# Patient Record
Sex: Female | Born: 1940 | Race: Black or African American | Hispanic: No | Smoking: Former smoker
Health system: Southern US, Community
[De-identification: ages and names within clinical notes are randomized; demographics above are authoritative.]

## PROBLEM LIST (undated history)

## (undated) DIAGNOSIS — I480 Paroxysmal atrial fibrillation: Secondary | ICD-10-CM

## (undated) DIAGNOSIS — I2510 Atherosclerotic heart disease of native coronary artery without angina pectoris: Secondary | ICD-10-CM

## (undated) DIAGNOSIS — Z992 Dependence on renal dialysis: Secondary | ICD-10-CM

## (undated) DIAGNOSIS — N186 End stage renal disease: Secondary | ICD-10-CM

## (undated) DIAGNOSIS — E785 Hyperlipidemia, unspecified: Secondary | ICD-10-CM

## (undated) DIAGNOSIS — I10 Essential (primary) hypertension: Secondary | ICD-10-CM

## (undated) DIAGNOSIS — D649 Anemia, unspecified: Secondary | ICD-10-CM

## (undated) DIAGNOSIS — J449 Chronic obstructive pulmonary disease, unspecified: Secondary | ICD-10-CM

## (undated) DIAGNOSIS — I739 Peripheral vascular disease, unspecified: Secondary | ICD-10-CM

## (undated) DIAGNOSIS — I350 Nonrheumatic aortic (valve) stenosis: Secondary | ICD-10-CM

## (undated) DIAGNOSIS — I509 Heart failure, unspecified: Secondary | ICD-10-CM

## (undated) DIAGNOSIS — I50812 Chronic right heart failure: Secondary | ICD-10-CM

## (undated) HISTORY — DX: Essential (primary) hypertension: I10

## (undated) HISTORY — DX: Heart failure, unspecified: I50.9

## (undated) HISTORY — DX: Chronic obstructive pulmonary disease, unspecified: J44.9

## (undated) HISTORY — DX: Atherosclerotic heart disease of native coronary artery without angina pectoris: I25.10

## (undated) HISTORY — DX: Hyperlipidemia, unspecified: E78.5

## (undated) HISTORY — DX: Peripheral vascular disease, unspecified: I73.9

## (undated) HISTORY — PX: ABDOMINAL HYSTERECTOMY: SHX81

## (undated) HISTORY — DX: Anemia, unspecified: D64.9

---

## 2005-10-01 ENCOUNTER — Ambulatory Visit: Payer: Self-pay | Admitting: Oncology

## 2005-12-10 ENCOUNTER — Ambulatory Visit: Payer: Self-pay | Admitting: Oncology

## 2006-03-11 ENCOUNTER — Ambulatory Visit: Payer: Self-pay | Admitting: Oncology

## 2006-04-27 ENCOUNTER — Ambulatory Visit: Payer: Self-pay | Admitting: Oncology

## 2006-07-29 ENCOUNTER — Ambulatory Visit: Payer: Self-pay | Admitting: Oncology

## 2006-12-27 ENCOUNTER — Ambulatory Visit: Payer: Self-pay | Admitting: Oncology

## 2007-03-08 ENCOUNTER — Ambulatory Visit: Payer: Self-pay

## 2007-04-25 ENCOUNTER — Ambulatory Visit: Payer: Self-pay | Admitting: Oncology

## 2007-12-25 ENCOUNTER — Ambulatory Visit: Payer: Self-pay | Admitting: Surgery

## 2008-01-12 ENCOUNTER — Ambulatory Visit: Payer: Self-pay | Admitting: Surgery

## 2008-01-12 ENCOUNTER — Ambulatory Visit (HOSPITAL_COMMUNITY): Admission: RE | Admit: 2008-01-12 | Discharge: 2008-01-12 | Payer: Self-pay | Admitting: Surgery

## 2008-01-12 HISTORY — PX: AV FISTULA PLACEMENT: SHX1204

## 2008-02-07 ENCOUNTER — Inpatient Hospital Stay (HOSPITAL_COMMUNITY): Admission: AD | Admit: 2008-02-07 | Discharge: 2008-02-13 | Payer: Self-pay | Admitting: Nephrology

## 2008-02-08 ENCOUNTER — Encounter (INDEPENDENT_AMBULATORY_CARE_PROVIDER_SITE_OTHER): Payer: Self-pay | Admitting: Nephrology

## 2008-04-08 ENCOUNTER — Ambulatory Visit: Payer: Self-pay | Admitting: Surgery

## 2008-04-23 ENCOUNTER — Ambulatory Visit: Payer: Self-pay | Admitting: Surgery

## 2008-04-23 ENCOUNTER — Ambulatory Visit (HOSPITAL_COMMUNITY): Admission: RE | Admit: 2008-04-23 | Discharge: 2008-04-23 | Payer: Self-pay | Admitting: Surgery

## 2008-05-27 ENCOUNTER — Ambulatory Visit: Payer: Self-pay | Admitting: Surgery

## 2009-10-10 ENCOUNTER — Inpatient Hospital Stay (HOSPITAL_COMMUNITY): Admission: EM | Admit: 2009-10-10 | Discharge: 2009-10-27 | Payer: Self-pay | Admitting: Pulmonary Disease

## 2009-10-10 ENCOUNTER — Ambulatory Visit: Payer: Self-pay | Admitting: Pulmonary Disease

## 2009-10-13 ENCOUNTER — Encounter (INDEPENDENT_AMBULATORY_CARE_PROVIDER_SITE_OTHER): Payer: Self-pay | Admitting: Pulmonary Disease

## 2009-10-15 ENCOUNTER — Ambulatory Visit: Payer: Self-pay | Admitting: Cardiothoracic Surgery

## 2009-10-15 HISTORY — PX: CARDIAC CATHETERIZATION: SHX172

## 2009-10-17 ENCOUNTER — Encounter: Payer: Self-pay | Admitting: Cardiothoracic Surgery

## 2009-10-20 ENCOUNTER — Encounter: Payer: Self-pay | Admitting: Cardiothoracic Surgery

## 2009-10-20 HISTORY — PX: CORONARY ARTERY BYPASS GRAFT: SHX141

## 2009-10-22 ENCOUNTER — Ambulatory Visit: Payer: Self-pay | Admitting: Physical Medicine & Rehabilitation

## 2009-11-19 ENCOUNTER — Encounter: Admission: RE | Admit: 2009-11-19 | Discharge: 2009-11-19 | Payer: Self-pay | Admitting: Cardiothoracic Surgery

## 2009-11-19 ENCOUNTER — Ambulatory Visit: Payer: Self-pay | Admitting: Cardiothoracic Surgery

## 2009-12-10 ENCOUNTER — Encounter: Admission: RE | Admit: 2009-12-10 | Discharge: 2009-12-10 | Payer: Self-pay | Admitting: Cardiothoracic Surgery

## 2009-12-10 ENCOUNTER — Ambulatory Visit: Payer: Self-pay | Admitting: Cardiothoracic Surgery

## 2009-12-15 ENCOUNTER — Inpatient Hospital Stay (HOSPITAL_COMMUNITY): Admission: EM | Admit: 2009-12-15 | Discharge: 2009-12-18 | Payer: Self-pay | Admitting: Internal Medicine

## 2009-12-16 ENCOUNTER — Ambulatory Visit: Payer: Self-pay | Admitting: Cardiothoracic Surgery

## 2009-12-17 HISTORY — PX: THORACENTESIS: SHX235

## 2009-12-24 ENCOUNTER — Ambulatory Visit: Payer: Self-pay | Admitting: Cardiothoracic Surgery

## 2009-12-24 ENCOUNTER — Encounter: Admission: RE | Admit: 2009-12-24 | Discharge: 2009-12-24 | Payer: Self-pay | Admitting: Cardiothoracic Surgery

## 2010-09-20 ENCOUNTER — Encounter: Payer: Self-pay | Admitting: Cardiothoracic Surgery

## 2010-11-09 ENCOUNTER — Inpatient Hospital Stay (HOSPITAL_COMMUNITY)
Admission: AD | Admit: 2010-11-09 | Discharge: 2010-11-12 | DRG: 377 | Disposition: A | Discharge status: Home / Self Care | Payer: Medicare Other | Source: Other Acute Inpatient Hospital | Attending: Internal Medicine | Admitting: Internal Medicine

## 2010-11-09 ENCOUNTER — Encounter: Payer: Self-pay | Admitting: Internal Medicine

## 2010-11-09 DIAGNOSIS — N186 End stage renal disease: Secondary | ICD-10-CM | POA: Diagnosis present

## 2010-11-09 DIAGNOSIS — I120 Hypertensive chronic kidney disease with stage 5 chronic kidney disease or end stage renal disease: Secondary | ICD-10-CM | POA: Diagnosis present

## 2010-11-09 DIAGNOSIS — E119 Type 2 diabetes mellitus without complications: Secondary | ICD-10-CM | POA: Diagnosis present

## 2010-11-09 DIAGNOSIS — Z794 Long term (current) use of insulin: Secondary | ICD-10-CM

## 2010-11-09 DIAGNOSIS — K254 Chronic or unspecified gastric ulcer with hemorrhage: Principal | ICD-10-CM | POA: Diagnosis present

## 2010-11-09 DIAGNOSIS — I2510 Atherosclerotic heart disease of native coronary artery without angina pectoris: Secondary | ICD-10-CM | POA: Diagnosis present

## 2010-11-09 DIAGNOSIS — N2581 Secondary hyperparathyroidism of renal origin: Secondary | ICD-10-CM | POA: Diagnosis present

## 2010-11-09 DIAGNOSIS — Z951 Presence of aortocoronary bypass graft: Secondary | ICD-10-CM

## 2010-11-09 DIAGNOSIS — D649 Anemia, unspecified: Secondary | ICD-10-CM | POA: Diagnosis present

## 2010-11-09 LAB — MRSA PCR SCREENING: MRSA by PCR: NEGATIVE

## 2010-11-09 NOTE — H&P (Signed)
Hospital Admission Note Date: 11/09/2010  Patient name: [name] Medical record number: [id number] Date of birth: [birth year] Age: 70 y.o. Gender: female PCP: No primary provider on file.  Medical Service: Internal Medicine Teaching Service  Attending physician: Dr. Rogelia Boga  Pager: Resident [phone]): Dr Baltazar Apo  Pager: [phone] Resident (R1): Dr Anselm Jungling   [phone]  Chief Complaint: black stool  History of Present Illness: 70 yo woman with PMH of s/p CABG for 3 vessels disease, CHF, ESRD on HD M/W/F, DM, COPD was transferred from [facility] for altered mental status during dialysis- she did receive full dialysis today.  Patient reports not feeling well since this Saturday 11/07/10.  She reports nausea and vomiting and diarrhea/black stool that darker than black in addition to vomiting up black "vomitus" as well; however, the story provided by the patient is unclear.  She has been taking Goody powder for her stomach pain. Hb was found to be 6.6 at [facility].  Denies any dizziness, weakness, SOB, or chest pain, urinary symptoms.    and states that she knew that her blood sugar was running low. She said usually her CBGs at home 110-120s.  HOME MEDS: Zemplar qweek Epogen 2600 qweek Venofer 100mg  qweek Loperamide 2mg  prn Metoprolol 50mg  qhs Isosorbide 60mg  qhs Renavite daily Lisinopril 20mg  poqd ASA 81mg  po qd Simvastatin 40mg  po qhs Insulin 70/30 SSI    Allergies: Sulfa and codeine: rash, swelling and hives  PMH:  1. Status post CABG x3 February 2011, for severe three-vessel,       unstable angina, and CHF.   2. 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   3. Diabetes.   4. COPD  FH: Father: died of unknown cause Mother: alive and healthy  History   Social History  . Marital Status: Widowed    Spouse Name: N/A    Number of Children: N/A  . Years of Education: N/A   Occupational History  . Not on file.   Social History Main Topics  . Smoking  status: Not on file  . Smokeless tobacco: Not on file  . Alcohol Use: Not on file  . Drug Use: Not on file  . Sexually Active: Not on file   Other Topics Concern  . Not on file   Social History Narrative  . No narrative on file    Review of Systems: Pertinent items are noted in HPI.  Physical Exam: T 98.2, BP 106/86, P 70, R 18., O2 sat 99% RA  General appearance: alert, cooperative and appears stated age Head: Normocephalic, without obvious abnormality, atraumatic Eyes: conjunctivae/corneas clear. PERRL, EOM's intact. Fundi benign. Throat: lips, mucosa, and tongue normal; teeth and gums normal Lungs: clear to auscultation bilaterally, no wheezes, no crackles Heart: regular rate and rhythm and S1, S2 normal, +4/6 systolic murmur Abdomen: soft, non-tender; bowel sounds normal; no masses,  no organomegaly Extremities: no edema, redness or tenderness in the calves or thighs, Right LE wound with black  Eschar 2x10cm with surrounding edema (she fell 3 weeks ago and did not go to the doctor or ED), no drainage or erythema Pulses: 2+ and symmetric Neurologic: Alert and oriented X 3, normal strength and tone. Normal symmetric reflexes. Normal coordination and gait   Lab results: Na 140  K 3.1, Cl 95, CO2 32, Glu 140, BUN 25, Cr 2.38, GFR 25, AG 16, Ca 9.6, Alb 3.3, T protein 5.7, Tro 0.05, Myoglobin 296, NT-proBNP 31600, AST 23, ALT 28, Alk phos 69, osm 275,, WBC 8.7, Hb  6.6, Hct 19.2, MCV 98, Plt 222, differential WNL RBC morph: +1 aniso, 1+ ellipto, +1 polychrom PT 11.1, PTT 25.4, INR 1.1  Assessment & Plan by Problem:  1. GI bleed: Hb 6.6 with a baseline Hb 9-10.  Likely upper GI bleed given her black stool.  But lower GI could be possible as well.   - Admit to SDU -type & cross & Transfuse 3 u pRBC, check CBC 1 hr after post transfusion - consulted GI for upper endoscopy to find source of bleeding -Will consult Renal in AM since her dialysis day is M/W/F -Patient received 80mg   IV bolus of protonix at [facility] and then started on gtt at 8mg /hr.  We spoke to GI and the recommendation is to keep patient on protonix gtt for now - will check cbc q 12 hr once the post transfusion is stable  2. ESRD- on HD M/W/F: normally receive dialysis in [place] -Will consult renal in AM - Will continue Zemplar, Epogen, Venofer, and Renavite  3. S/p CABG: stable, no complaints of chest pain.  Will continue Simvastatin, will hold ASA & isosorbide  in setting of GI bleed  4. DM type II- stable, will check HbA1c, SSI-sensitive, CBGs qAC & HS  VTE prophylaxis: SCDs     ATTENDING: I performed and/or observed a history and physical examination of the patient.  I discussed the case with the residents as noted and reviewed the residents' notes.  I agree with the findings and plan--please refer to the attending physician note for more details.   Signature________________________________  Printed Name_____________________________

## 2010-11-10 ENCOUNTER — Other Ambulatory Visit: Payer: Self-pay | Admitting: Internal Medicine

## 2010-11-10 DIAGNOSIS — K259 Gastric ulcer, unspecified as acute or chronic, without hemorrhage or perforation: Secondary | ICD-10-CM

## 2010-11-10 DIAGNOSIS — D649 Anemia, unspecified: Secondary | ICD-10-CM

## 2010-11-10 DIAGNOSIS — K922 Gastrointestinal hemorrhage, unspecified: Secondary | ICD-10-CM

## 2010-11-10 DIAGNOSIS — K921 Melena: Secondary | ICD-10-CM

## 2010-11-10 LAB — CBC
HCT: 30.7 % — ABNORMAL LOW (ref 36.0–46.0)
HCT: 36.4 % (ref 36.0–46.0)
MCH: 31.1 pg (ref 26.0–34.0)
MCHC: 33.8 g/dL (ref 30.0–36.0)
MCV: 90 fL (ref 78.0–100.0)
MCV: 90.1 fL (ref 78.0–100.0)
RDW: 16.7 % — ABNORMAL HIGH (ref 11.5–15.5)
RDW: 17.2 % — ABNORMAL HIGH (ref 11.5–15.5)
WBC: 8.5 10*3/uL (ref 4.0–10.5)

## 2010-11-10 LAB — GLUCOSE, CAPILLARY: Glucose-Capillary: 124 mg/dL — ABNORMAL HIGH (ref 70–99)

## 2010-11-10 LAB — RENAL FUNCTION PANEL
BUN: 31 mg/dL — ABNORMAL HIGH (ref 6–23)
Glucose, Bld: 108 mg/dL — ABNORMAL HIGH (ref 70–99)
Phosphorus: 4.7 mg/dL — ABNORMAL HIGH (ref 2.3–4.6)
Potassium: 3.5 mEq/L (ref 3.5–5.1)

## 2010-11-10 LAB — CARDIAC PANEL(CRET KIN+CKTOT+MB+TROPI)
CK, MB: 2.4 ng/mL (ref 0.3–4.0)
CK, MB: 2.8 ng/mL (ref 0.3–4.0)
Relative Index: INVALID (ref 0.0–2.5)
Total CK: 55 U/L (ref 7–177)

## 2010-11-11 ENCOUNTER — Inpatient Hospital Stay (HOSPITAL_COMMUNITY): Payer: Medicare Other

## 2010-11-11 LAB — CROSSMATCH
ABO/RH(D): O POS
Unit division: 0

## 2010-11-11 LAB — CBC
HCT: 32.5 % — ABNORMAL LOW (ref 36.0–46.0)
MCHC: 34.5 g/dL (ref 30.0–36.0)
MCV: 90.5 fL (ref 78.0–100.0)
RDW: 17.1 % — ABNORMAL HIGH (ref 11.5–15.5)
WBC: 10.1 10*3/uL (ref 4.0–10.5)

## 2010-11-11 LAB — GLUCOSE, CAPILLARY
Glucose-Capillary: 147 mg/dL — ABNORMAL HIGH (ref 70–99)
Glucose-Capillary: 147 mg/dL — ABNORMAL HIGH (ref 70–99)

## 2010-11-11 LAB — RENAL FUNCTION PANEL
CO2: 27 mEq/L (ref 19–32)
Calcium: 8.4 mg/dL (ref 8.4–10.5)
Creatinine, Ser: 5.89 mg/dL — ABNORMAL HIGH (ref 0.4–1.2)
Glucose, Bld: 128 mg/dL — ABNORMAL HIGH (ref 70–99)

## 2010-11-11 LAB — CARDIAC PANEL(CRET KIN+CKTOT+MB+TROPI)
CK, MB: 2.2 ng/mL (ref 0.3–4.0)
Relative Index: INVALID (ref 0.0–2.5)
Troponin I: 0.22 ng/mL — ABNORMAL HIGH (ref 0.00–0.06)

## 2010-11-12 DIAGNOSIS — K922 Gastrointestinal hemorrhage, unspecified: Secondary | ICD-10-CM

## 2010-11-12 LAB — RENAL FUNCTION PANEL
Albumin: 3 g/dL — ABNORMAL LOW (ref 3.5–5.2)
GFR calc Af Amer: 13 mL/min — ABNORMAL LOW (ref >60)
GFR calc non Af Amer: 11 mL/min — ABNORMAL LOW (ref >60)
Glucose, Bld: 207 mg/dL — ABNORMAL HIGH (ref 70–99)
Phosphorus: 2.5 mg/dL (ref 2.3–4.6)
Potassium: 4 mEq/L (ref 3.5–5.1)
Sodium: 134 mEq/L — ABNORMAL LOW (ref 135–145)

## 2010-11-12 LAB — CBC
HCT: 38.8 % (ref 36.0–46.0)
RDW: 17 % — ABNORMAL HIGH (ref 11.5–15.5)
WBC: 8.6 10*3/uL (ref 4.0–10.5)

## 2010-11-12 LAB — GLUCOSE, CAPILLARY
Glucose-Capillary: 253 mg/dL — ABNORMAL HIGH (ref 70–99)
Glucose-Capillary: 202 mg/dL — ABNORMAL HIGH (ref 70–99)

## 2010-11-17 LAB — BODY FLUID CULTURE

## 2010-11-17 LAB — GLUCOSE, CAPILLARY
Glucose-Capillary: 101 mg/dL — ABNORMAL HIGH (ref 70–99)
Glucose-Capillary: 131 mg/dL — ABNORMAL HIGH (ref 70–99)
Glucose-Capillary: 141 mg/dL — ABNORMAL HIGH (ref 70–99)
Glucose-Capillary: 185 mg/dL — ABNORMAL HIGH (ref 70–99)
Glucose-Capillary: 187 mg/dL — ABNORMAL HIGH (ref 70–99)
Glucose-Capillary: 243 mg/dL — ABNORMAL HIGH (ref 70–99)
Glucose-Capillary: 45 mg/dL — ABNORMAL LOW (ref 70–99)
Glucose-Capillary: 64 mg/dL — ABNORMAL LOW (ref 70–99)
Glucose-Capillary: 69 mg/dL — ABNORMAL LOW (ref 70–99)
Glucose-Capillary: 87 mg/dL (ref 70–99)

## 2010-11-17 LAB — CBC
HCT: 31.9 % — ABNORMAL LOW (ref 36.0–46.0)
Hemoglobin: 10.7 g/dL — ABNORMAL LOW (ref 12.0–15.0)
MCHC: 32.1 g/dL (ref 30.0–36.0)
MCV: 88.9 fL (ref 78.0–100.0)
Platelets: 321 10*3/uL (ref 150–400)
RBC: 3.75 MIL/uL — ABNORMAL LOW (ref 3.87–5.11)
RDW: 22 % — ABNORMAL HIGH (ref 11.5–15.5)
WBC: 7.8 10*3/uL (ref 4.0–10.5)

## 2010-11-17 LAB — RENAL FUNCTION PANEL
Albumin: 2.8 g/dL — ABNORMAL LOW (ref 3.5–5.2)
Albumin: 3 g/dL — ABNORMAL LOW (ref 3.5–5.2)
BUN: 45 mg/dL — ABNORMAL HIGH (ref 6–23)
Calcium: 8.3 mg/dL — ABNORMAL LOW (ref 8.4–10.5)
Creatinine, Ser: 6.5 mg/dL — ABNORMAL HIGH (ref 0.4–1.2)
GFR calc Af Amer: 20 mL/min — ABNORMAL LOW (ref >60)
GFR calc non Af Amer: 17 mL/min — ABNORMAL LOW (ref >60)
Phosphorus: 2.2 mg/dL — ABNORMAL LOW (ref 2.3–4.6)
Phosphorus: 3.8 mg/dL (ref 2.3–4.6)
Potassium: 4.2 mEq/L (ref 3.5–5.1)
Sodium: 135 mEq/L (ref 135–145)

## 2010-11-17 LAB — BASIC METABOLIC PANEL
CO2: 25 mEq/L (ref 19–32)
Calcium: 8.6 mg/dL (ref 8.4–10.5)
GFR calc Af Amer: 10 mL/min — ABNORMAL LOW (ref >60)
Sodium: 123 mEq/L — ABNORMAL LOW (ref 135–145)

## 2010-11-17 NOTE — Procedures (Addendum)
Summary: Upper Endoscopy  Patient: [name] Note: All result statuses are Final unless otherwise noted.  Tests: (1) Upper Endoscopy (EGD)   EGD Upper Endoscopy       DONE     [facility]     [address]     [place], [place]  [address]          ENDOSCOPY PROCEDURE REPORT          PATIENT:  [name], [name]  MR#:  [id number]     BIRTHDATE:  February 03, 1941, 69 yrs. old  GENDER:  female          ENDOSCOPIST:  Hedwig Morton. Juanda Chance, MD     Referred by:  Blanch Media, M.D.          PROCEDURE DATE:  11/10/2010     PROCEDURE:  EGD with biopsy, 43239     ASA CLASS:  Class III     INDICATIONS:  melena, anemia ? hx of UGIB in remote past          MEDICATIONS:   Versed 3 mg, Fentanyl 20 mcg     TOPICAL ANESTHETIC:  Cetacaine Spray          DESCRIPTION OF PROCEDURE:   After the risks benefits and     alternatives of the procedure were thoroughly explained, informed     consent was obtained.  The Pentax Gastroscope [id number] endoscope     was introduced through the mouth and advanced to the second     portion of the duodenum, without limitations.  The instrument was     slowly withdrawn as the mucosa was fully examined.     <<PROCEDUREIMAGES>>          An ulcer was found in the antrum. With standard forceps, a biopsy     was obtained and sent to pathology (see image001, image004, and     image005). 1 cm oval shape ulcer with clean egdes, and benign     features, no stigmata of bleeding, no blood in the stomach     Otherwise the examination was normal (see image006, image007,     image003, and image002).    Retroflexed views revealed no     abnormalities.    The scope was then withdrawn from the patient     and the procedure completed.          COMPLICATIONS:  None          ENDOSCOPIC IMPRESSION:     1) Ulcer in the antrum     2) Otherwise normal examination     benign appearing medium size prepyloric ulcer without stigmata     of recent bleeding,,no evidence  of outlet obstruction     RECOMMENDATIONS:     1) Await biopsy results     see chart for plan of treatment          REPEAT EXAM:  In 8 week(s) for.          ______________________________     Hedwig Morton. Juanda Chance, MD          CC:          n.     eSIGNED:   Hedwig Morton. Leanora Murin at 11/10/2010 03:40 PM          Vania Rea, [id number]  Note: An exclamation mark (!) indicates a result that was not dispersed into the flowsheet. Document Creation Date: 11/10/2010 3:42 PM _______________________________________________________________________  (1) Order result status: Final  Collection or observation date-time: 11/10/2010 15:32 Requested date-time:  Receipt date-time:  Reported date-time:  Referring Physician:   Ordering Physician: Lina Sar [phone]) Specimen Source:  Source: Launa Grill Order Number: [id number] Lab site:

## 2010-11-18 LAB — BLOOD GAS, ARTERIAL
Acid-base deficit: 1.3 mmol/L (ref 0.0–2.0)
Bicarbonate: 23.1 mEq/L (ref 20.0–24.0)
FIO2: 0.21 %
O2 Saturation: 87.2 %
Patient temperature: 98.6
TCO2: 24.3 mmol/L (ref 0–100)
pCO2 arterial: 39.7 mmHg (ref 35.0–45.0)
pH, Arterial: 7.382 (ref 7.350–7.400)
pO2, Arterial: 53.9 mmHg — ABNORMAL LOW (ref 80.0–100.0)

## 2010-11-18 LAB — GLUCOSE, CAPILLARY
Glucose-Capillary: 103 mg/dL — ABNORMAL HIGH (ref 70–99)
Glucose-Capillary: 105 mg/dL — ABNORMAL HIGH (ref 70–99)
Glucose-Capillary: 108 mg/dL — ABNORMAL HIGH (ref 70–99)
Glucose-Capillary: 111 mg/dL — ABNORMAL HIGH (ref 70–99)
Glucose-Capillary: 113 mg/dL — ABNORMAL HIGH (ref 70–99)
Glucose-Capillary: 122 mg/dL — ABNORMAL HIGH (ref 70–99)
Glucose-Capillary: 132 mg/dL — ABNORMAL HIGH (ref 70–99)
Glucose-Capillary: 135 mg/dL — ABNORMAL HIGH (ref 70–99)
Glucose-Capillary: 136 mg/dL — ABNORMAL HIGH (ref 70–99)
Glucose-Capillary: 142 mg/dL — ABNORMAL HIGH (ref 70–99)
Glucose-Capillary: 144 mg/dL — ABNORMAL HIGH (ref 70–99)
Glucose-Capillary: 146 mg/dL — ABNORMAL HIGH (ref 70–99)
Glucose-Capillary: 148 mg/dL — ABNORMAL HIGH (ref 70–99)
Glucose-Capillary: 152 mg/dL — ABNORMAL HIGH (ref 70–99)
Glucose-Capillary: 165 mg/dL — ABNORMAL HIGH (ref 70–99)
Glucose-Capillary: 165 mg/dL — ABNORMAL HIGH (ref 70–99)
Glucose-Capillary: 168 mg/dL — ABNORMAL HIGH (ref 70–99)
Glucose-Capillary: 170 mg/dL — ABNORMAL HIGH (ref 70–99)
Glucose-Capillary: 171 mg/dL — ABNORMAL HIGH (ref 70–99)
Glucose-Capillary: 171 mg/dL — ABNORMAL HIGH (ref 70–99)
Glucose-Capillary: 174 mg/dL — ABNORMAL HIGH (ref 70–99)
Glucose-Capillary: 174 mg/dL — ABNORMAL HIGH (ref 70–99)
Glucose-Capillary: 177 mg/dL — ABNORMAL HIGH (ref 70–99)
Glucose-Capillary: 179 mg/dL — ABNORMAL HIGH (ref 70–99)
Glucose-Capillary: 63 mg/dL — ABNORMAL LOW (ref 70–99)
Glucose-Capillary: 70 mg/dL (ref 70–99)
Glucose-Capillary: 71 mg/dL (ref 70–99)
Glucose-Capillary: 72 mg/dL (ref 70–99)
Glucose-Capillary: 75 mg/dL (ref 70–99)
Glucose-Capillary: 83 mg/dL (ref 70–99)
Glucose-Capillary: 84 mg/dL (ref 70–99)
Glucose-Capillary: 95 mg/dL (ref 70–99)
Glucose-Capillary: 96 mg/dL (ref 70–99)

## 2010-11-18 LAB — CBC
HCT: 25.2 % — ABNORMAL LOW (ref 36.0–46.0)
HCT: 25.5 % — ABNORMAL LOW (ref 36.0–46.0)
HCT: 27.1 % — ABNORMAL LOW (ref 36.0–46.0)
HCT: 27.4 % — ABNORMAL LOW (ref 36.0–46.0)
HCT: 27.7 % — ABNORMAL LOW (ref 36.0–46.0)
HCT: 28 % — ABNORMAL LOW (ref 36.0–46.0)
HCT: 28.4 % — ABNORMAL LOW (ref 36.0–46.0)
HCT: 29.2 % — ABNORMAL LOW (ref 36.0–46.0)
HCT: 29.6 % — ABNORMAL LOW (ref 36.0–46.0)
HCT: 29.6 % — ABNORMAL LOW (ref 36.0–46.0)
HCT: 30.1 % — ABNORMAL LOW (ref 36.0–46.0)
HCT: 30.4 % — ABNORMAL LOW (ref 36.0–46.0)
HCT: 31.4 % — ABNORMAL LOW (ref 36.0–46.0)
HCT: 31.6 % — ABNORMAL LOW (ref 36.0–46.0)
HCT: 34.3 % — ABNORMAL LOW (ref 36.0–46.0)
HCT: 34.3 % — ABNORMAL LOW (ref 36.0–46.0)
HCT: 38.7 % (ref 36.0–46.0)
HCT: 43.6 % (ref 36.0–46.0)
Hemoglobin: 10 g/dL — ABNORMAL LOW (ref 12.0–15.0)
Hemoglobin: 10 g/dL — ABNORMAL LOW (ref 12.0–15.0)
Hemoglobin: 10.1 g/dL — ABNORMAL LOW (ref 12.0–15.0)
Hemoglobin: 10.7 g/dL — ABNORMAL LOW (ref 12.0–15.0)
Hemoglobin: 10.8 g/dL — ABNORMAL LOW (ref 12.0–15.0)
Hemoglobin: 11.8 g/dL — ABNORMAL LOW (ref 12.0–15.0)
Hemoglobin: 11.8 g/dL — ABNORMAL LOW (ref 12.0–15.0)
Hemoglobin: 13.3 g/dL (ref 12.0–15.0)
Hemoglobin: 14.8 g/dL (ref 12.0–15.0)
Hemoglobin: 8.5 g/dL — ABNORMAL LOW (ref 12.0–15.0)
Hemoglobin: 8.9 g/dL — ABNORMAL LOW (ref 12.0–15.0)
Hemoglobin: 9.3 g/dL — ABNORMAL LOW (ref 12.0–15.0)
Hemoglobin: 9.4 g/dL — ABNORMAL LOW (ref 12.0–15.0)
Hemoglobin: 9.6 g/dL — ABNORMAL LOW (ref 12.0–15.0)
Hemoglobin: 9.9 g/dL — ABNORMAL LOW (ref 12.0–15.0)
MCHC: 33.5 g/dL (ref 30.0–36.0)
MCHC: 33.7 g/dL (ref 30.0–36.0)
MCHC: 33.7 g/dL (ref 30.0–36.0)
MCHC: 33.7 g/dL (ref 30.0–36.0)
MCHC: 33.8 g/dL (ref 30.0–36.0)
MCHC: 34 g/dL (ref 30.0–36.0)
MCHC: 34 g/dL (ref 30.0–36.0)
MCHC: 34.1 g/dL (ref 30.0–36.0)
MCHC: 34.2 g/dL (ref 30.0–36.0)
MCHC: 34.2 g/dL (ref 30.0–36.0)
MCHC: 34.2 g/dL (ref 30.0–36.0)
MCHC: 34.3 g/dL (ref 30.0–36.0)
MCHC: 34.3 g/dL (ref 30.0–36.0)
MCHC: 34.4 g/dL (ref 30.0–36.0)
MCV: 93.3 fL (ref 78.0–100.0)
MCV: 93.5 fL (ref 78.0–100.0)
MCV: 93.5 fL (ref 78.0–100.0)
MCV: 94.2 fL (ref 78.0–100.0)
MCV: 94.2 fL (ref 78.0–100.0)
MCV: 94.6 fL (ref 78.0–100.0)
MCV: 94.7 fL (ref 78.0–100.0)
MCV: 94.7 fL (ref 78.0–100.0)
MCV: 95 fL (ref 78.0–100.0)
MCV: 95 fL (ref 78.0–100.0)
MCV: 95.1 fL (ref 78.0–100.0)
MCV: 95.2 fL (ref 78.0–100.0)
MCV: 95.5 fL (ref 78.0–100.0)
MCV: 95.7 fL (ref 78.0–100.0)
MCV: 95.9 fL (ref 78.0–100.0)
MCV: 96.1 fL (ref 78.0–100.0)
MCV: 96.9 fL (ref 78.0–100.0)
Platelets: 156 10*3/uL (ref 150–400)
Platelets: 159 10*3/uL (ref 150–400)
Platelets: 160 10*3/uL (ref 150–400)
Platelets: 177 10*3/uL (ref 150–400)
Platelets: 180 10*3/uL (ref 150–400)
Platelets: 183 10*3/uL (ref 150–400)
Platelets: 185 10*3/uL (ref 150–400)
Platelets: 191 10*3/uL (ref 150–400)
Platelets: 193 10*3/uL (ref 150–400)
Platelets: 199 10*3/uL (ref 150–400)
Platelets: 216 10*3/uL (ref 150–400)
Platelets: 217 10*3/uL (ref 150–400)
Platelets: 263 10*3/uL (ref 150–400)
Platelets: 359 10*3/uL (ref 150–400)
Platelets: 371 10*3/uL (ref 150–400)
Platelets: 401 10*3/uL — ABNORMAL HIGH (ref 150–400)
RBC: 2.67 MIL/uL — ABNORMAL LOW (ref 3.87–5.11)
RBC: 2.82 MIL/uL — ABNORMAL LOW (ref 3.87–5.11)
RBC: 2.82 MIL/uL — ABNORMAL LOW (ref 3.87–5.11)
RBC: 2.89 MIL/uL — ABNORMAL LOW (ref 3.87–5.11)
RBC: 2.98 MIL/uL — ABNORMAL LOW (ref 3.87–5.11)
RBC: 3.07 MIL/uL — ABNORMAL LOW (ref 3.87–5.11)
RBC: 3.09 MIL/uL — ABNORMAL LOW (ref 3.87–5.11)
RBC: 3.11 MIL/uL — ABNORMAL LOW (ref 3.87–5.11)
RBC: 3.21 MIL/uL — ABNORMAL LOW (ref 3.87–5.11)
RBC: 3.34 MIL/uL — ABNORMAL LOW (ref 3.87–5.11)
RBC: 3.65 MIL/uL — ABNORMAL LOW (ref 3.87–5.11)
RBC: 3.67 MIL/uL — ABNORMAL LOW (ref 3.87–5.11)
RBC: 4.15 MIL/uL (ref 3.87–5.11)
RBC: 4.66 MIL/uL (ref 3.87–5.11)
RDW: 14.5 % (ref 11.5–15.5)
RDW: 14.7 % (ref 11.5–15.5)
RDW: 14.8 % (ref 11.5–15.5)
RDW: 14.8 % (ref 11.5–15.5)
RDW: 14.9 % (ref 11.5–15.5)
RDW: 15 % (ref 11.5–15.5)
RDW: 15.1 % (ref 11.5–15.5)
RDW: 15.1 % (ref 11.5–15.5)
RDW: 15.3 % (ref 11.5–15.5)
RDW: 15.3 % (ref 11.5–15.5)
RDW: 15.5 % (ref 11.5–15.5)
RDW: 15.7 % — ABNORMAL HIGH (ref 11.5–15.5)
RDW: 15.9 % — ABNORMAL HIGH (ref 11.5–15.5)
RDW: 16 % — ABNORMAL HIGH (ref 11.5–15.5)
RDW: 16.2 % — ABNORMAL HIGH (ref 11.5–15.5)
WBC: 12.3 10*3/uL — ABNORMAL HIGH (ref 4.0–10.5)
WBC: 12.4 10*3/uL — ABNORMAL HIGH (ref 4.0–10.5)
WBC: 12.6 10*3/uL — ABNORMAL HIGH (ref 4.0–10.5)
WBC: 13.9 10*3/uL — ABNORMAL HIGH (ref 4.0–10.5)
WBC: 15.2 10*3/uL — ABNORMAL HIGH (ref 4.0–10.5)
WBC: 15.7 10*3/uL — ABNORMAL HIGH (ref 4.0–10.5)
WBC: 16.1 10*3/uL — ABNORMAL HIGH (ref 4.0–10.5)
WBC: 17.3 10*3/uL — ABNORMAL HIGH (ref 4.0–10.5)
WBC: 17.6 10*3/uL — ABNORMAL HIGH (ref 4.0–10.5)
WBC: 24.2 10*3/uL — ABNORMAL HIGH (ref 4.0–10.5)
WBC: 29.6 10*3/uL — ABNORMAL HIGH (ref 4.0–10.5)
WBC: 7.7 10*3/uL (ref 4.0–10.5)
WBC: 8.7 10*3/uL (ref 4.0–10.5)
WBC: 9.2 10*3/uL (ref 4.0–10.5)

## 2010-11-18 LAB — COMPREHENSIVE METABOLIC PANEL
ALT: 26 U/L (ref 0–35)
ALT: 43 U/L — ABNORMAL HIGH (ref 0–35)
ALT: 45 U/L — ABNORMAL HIGH (ref 0–35)
AST: 28 U/L (ref 0–37)
AST: 33 U/L (ref 0–37)
AST: 94 U/L — ABNORMAL HIGH (ref 0–37)
Albumin: 2.7 g/dL — ABNORMAL LOW (ref 3.5–5.2)
Albumin: 3.2 g/dL — ABNORMAL LOW (ref 3.5–5.2)
Albumin: 3.4 g/dL — ABNORMAL LOW (ref 3.5–5.2)
Alkaline Phosphatase: 57 U/L (ref 39–117)
Alkaline Phosphatase: 58 U/L (ref 39–117)
Alkaline Phosphatase: 59 U/L (ref 39–117)
BUN: 12 mg/dL (ref 6–23)
BUN: 12 mg/dL (ref 6–23)
BUN: 38 mg/dL — ABNORMAL HIGH (ref 6–23)
CO2: 23 mEq/L (ref 19–32)
CO2: 25 mEq/L (ref 19–32)
CO2: 25 mEq/L (ref 19–32)
CO2: 27 mEq/L (ref 19–32)
Calcium: 7.9 mg/dL — ABNORMAL LOW (ref 8.4–10.5)
Calcium: 8.5 mg/dL (ref 8.4–10.5)
Chloride: 100 mEq/L (ref 96–112)
Chloride: 100 mEq/L (ref 96–112)
Chloride: 96 mEq/L (ref 96–112)
Chloride: 97 mEq/L (ref 96–112)
Creatinine, Ser: 3.41 mg/dL — ABNORMAL HIGH (ref 0.4–1.2)
Creatinine, Ser: 3.74 mg/dL — ABNORMAL HIGH (ref 0.4–1.2)
Creatinine, Ser: 4.1 mg/dL — ABNORMAL HIGH (ref 0.4–1.2)
Creatinine, Ser: 6.72 mg/dL — ABNORMAL HIGH (ref 0.4–1.2)
GFR calc Af Amer: 13 mL/min — ABNORMAL LOW (ref >60)
GFR calc Af Amer: 15 mL/min — ABNORMAL LOW (ref >60)
GFR calc Af Amer: 16 mL/min — ABNORMAL LOW (ref >60)
GFR calc non Af Amer: 11 mL/min — ABNORMAL LOW (ref >60)
GFR calc non Af Amer: 12 mL/min — ABNORMAL LOW (ref >60)
GFR calc non Af Amer: 13 mL/min — ABNORMAL LOW (ref >60)
GFR calc non Af Amer: 6 mL/min — ABNORMAL LOW (ref >60)
Glucose, Bld: 116 mg/dL — ABNORMAL HIGH (ref 70–99)
Glucose, Bld: 165 mg/dL — ABNORMAL HIGH (ref 70–99)
Glucose, Bld: 203 mg/dL — ABNORMAL HIGH (ref 70–99)
Glucose, Bld: 257 mg/dL — ABNORMAL HIGH (ref 70–99)
Potassium: 3.7 mEq/L (ref 3.5–5.1)
Potassium: 4 mEq/L (ref 3.5–5.1)
Potassium: 4.1 mEq/L (ref 3.5–5.1)
Sodium: 136 mEq/L (ref 135–145)
Sodium: 138 mEq/L (ref 135–145)
Total Bilirubin: 0.7 mg/dL (ref 0.3–1.2)
Total Bilirubin: 0.7 mg/dL (ref 0.3–1.2)
Total Bilirubin: 0.9 mg/dL (ref 0.3–1.2)
Total Bilirubin: 1 mg/dL (ref 0.3–1.2)
Total Protein: 5.5 g/dL — ABNORMAL LOW (ref 6.0–8.3)
Total Protein: 6.8 g/dL (ref 6.0–8.3)

## 2010-11-18 LAB — RENAL FUNCTION PANEL
Albumin: 2.2 g/dL — ABNORMAL LOW (ref 3.5–5.2)
Albumin: 2.3 g/dL — ABNORMAL LOW (ref 3.5–5.2)
Albumin: 2.7 g/dL — ABNORMAL LOW (ref 3.5–5.2)
Albumin: 2.7 g/dL — ABNORMAL LOW (ref 3.5–5.2)
Albumin: 2.8 g/dL — ABNORMAL LOW (ref 3.5–5.2)
Albumin: 3.4 g/dL — ABNORMAL LOW (ref 3.5–5.2)
Albumin: 3.5 g/dL (ref 3.5–5.2)
BUN: 20 mg/dL (ref 6–23)
BUN: 25 mg/dL — ABNORMAL HIGH (ref 6–23)
BUN: 27 mg/dL — ABNORMAL HIGH (ref 6–23)
BUN: 27 mg/dL — ABNORMAL HIGH (ref 6–23)
BUN: 44 mg/dL — ABNORMAL HIGH (ref 6–23)
CO2: 21 mEq/L (ref 19–32)
CO2: 21 mEq/L (ref 19–32)
CO2: 23 mEq/L (ref 19–32)
CO2: 23 mEq/L (ref 19–32)
Calcium: 7.6 mg/dL — ABNORMAL LOW (ref 8.4–10.5)
Calcium: 7.8 mg/dL — ABNORMAL LOW (ref 8.4–10.5)
Calcium: 8.2 mg/dL — ABNORMAL LOW (ref 8.4–10.5)
Calcium: 8.3 mg/dL — ABNORMAL LOW (ref 8.4–10.5)
Chloride: 107 mEq/L (ref 96–112)
Chloride: 93 mEq/L — ABNORMAL LOW (ref 96–112)
Chloride: 94 mEq/L — ABNORMAL LOW (ref 96–112)
Chloride: 94 mEq/L — ABNORMAL LOW (ref 96–112)
Chloride: 98 mEq/L (ref 96–112)
Creatinine, Ser: 5.36 mg/dL — ABNORMAL HIGH (ref 0.4–1.2)
Creatinine, Ser: 5.37 mg/dL — ABNORMAL HIGH (ref 0.4–1.2)
Creatinine, Ser: 5.88 mg/dL — ABNORMAL HIGH (ref 0.4–1.2)
Creatinine, Ser: 7.85 mg/dL — ABNORMAL HIGH (ref 0.4–1.2)
GFR calc Af Amer: 10 mL/min — ABNORMAL LOW (ref >60)
GFR calc Af Amer: 18 mL/min — ABNORMAL LOW (ref >60)
GFR calc Af Amer: 6 mL/min — ABNORMAL LOW (ref >60)
GFR calc Af Amer: 7 mL/min — ABNORMAL LOW (ref >60)
GFR calc Af Amer: 8 mL/min — ABNORMAL LOW (ref >60)
GFR calc Af Amer: 9 mL/min — ABNORMAL LOW (ref >60)
GFR calc non Af Amer: 15 mL/min — ABNORMAL LOW (ref >60)
GFR calc non Af Amer: 5 mL/min — ABNORMAL LOW (ref >60)
GFR calc non Af Amer: 6 mL/min — ABNORMAL LOW (ref >60)
GFR calc non Af Amer: 7 mL/min — ABNORMAL LOW (ref >60)
GFR calc non Af Amer: 8 mL/min — ABNORMAL LOW (ref >60)
Glucose, Bld: 125 mg/dL — ABNORMAL HIGH (ref 70–99)
Glucose, Bld: 132 mg/dL — ABNORMAL HIGH (ref 70–99)
Glucose, Bld: 143 mg/dL — ABNORMAL HIGH (ref 70–99)
Glucose, Bld: 168 mg/dL — ABNORMAL HIGH (ref 70–99)
Glucose, Bld: 195 mg/dL — ABNORMAL HIGH (ref 70–99)
Glucose, Bld: 263 mg/dL — ABNORMAL HIGH (ref 70–99)
Glucose, Bld: 78 mg/dL (ref 70–99)
Phosphorus: 1.7 mg/dL — ABNORMAL LOW (ref 2.3–4.6)
Phosphorus: 3 mg/dL (ref 2.3–4.6)
Phosphorus: 3.7 mg/dL (ref 2.3–4.6)
Phosphorus: 3.9 mg/dL (ref 2.3–4.6)
Phosphorus: 4.3 mg/dL (ref 2.3–4.6)
Phosphorus: 5.6 mg/dL — ABNORMAL HIGH (ref 2.3–4.6)
Potassium: 3.1 mEq/L — ABNORMAL LOW (ref 3.5–5.1)
Potassium: 3.7 mEq/L (ref 3.5–5.1)
Potassium: 3.9 mEq/L (ref 3.5–5.1)
Potassium: 4 mEq/L (ref 3.5–5.1)
Sodium: 131 mEq/L — ABNORMAL LOW (ref 135–145)
Sodium: 132 mEq/L — ABNORMAL LOW (ref 135–145)
Sodium: 133 mEq/L — ABNORMAL LOW (ref 135–145)
Sodium: 135 mEq/L (ref 135–145)
Sodium: 138 mEq/L (ref 135–145)
Sodium: 139 mEq/L (ref 135–145)

## 2010-11-18 LAB — POCT I-STAT 3, ART BLOOD GAS (G3+)
Acid-Base Excess: 1 mmol/L (ref 0.0–2.0)
Acid-base deficit: 3 mmol/L — ABNORMAL HIGH (ref 0.0–2.0)
Acid-base deficit: 3 mmol/L — ABNORMAL HIGH (ref 0.0–2.0)
Acid-base deficit: 5 mmol/L — ABNORMAL HIGH (ref 0.0–2.0)
Acid-base deficit: 5 mmol/L — ABNORMAL HIGH (ref 0.0–2.0)
Bicarbonate: 19.9 mEq/L — ABNORMAL LOW (ref 20.0–24.0)
Bicarbonate: 20.9 mEq/L (ref 20.0–24.0)
Bicarbonate: 22 mEq/L (ref 20.0–24.0)
Bicarbonate: 22.3 mEq/L (ref 20.0–24.0)
O2 Saturation: 96 %
O2 Saturation: 99 %
O2 Saturation: 99 %
O2 Saturation: 99 %
Patient temperature: 36.9
Patient temperature: 37.1
TCO2: 20 mmol/L (ref 0–100)
TCO2: 21 mmol/L (ref 0–100)
TCO2: 22 mmol/L (ref 0–100)
TCO2: 23 mmol/L (ref 0–100)
pCO2 arterial: 38 mmHg (ref 35.0–45.0)
pCO2 arterial: 38.3 mmHg (ref 35.0–45.0)
pCO2 arterial: 40.1 mmHg (ref 35.0–45.0)
pCO2 arterial: 40.1 mmHg (ref 35.0–45.0)
pCO2 arterial: 40.2 mmHg (ref 35.0–45.0)
pH, Arterial: 7.295 — ABNORMAL LOW (ref 7.350–7.400)
pH, Arterial: 7.31 — ABNORMAL LOW (ref 7.350–7.400)
pH, Arterial: 7.381 (ref 7.350–7.400)
pH, Arterial: 7.441 — ABNORMAL HIGH (ref 7.350–7.400)
pH, Arterial: 7.449 — ABNORMAL HIGH (ref 7.350–7.400)
pO2, Arterial: 134 mmHg — ABNORMAL HIGH (ref 80.0–100.0)
pO2, Arterial: 139 mmHg — ABNORMAL HIGH (ref 80.0–100.0)
pO2, Arterial: 275 mmHg — ABNORMAL HIGH (ref 80.0–100.0)
pO2, Arterial: 347 mmHg — ABNORMAL HIGH (ref 80.0–100.0)
pO2, Arterial: 366 mmHg — ABNORMAL HIGH (ref 80.0–100.0)

## 2010-11-18 LAB — PREPARE PLATELETS

## 2010-11-18 LAB — POCT I-STAT, CHEM 8
BUN: 19 mg/dL (ref 6–23)
BUN: 8 mg/dL (ref 6–23)
Calcium, Ion: 0.7 mmol/L — CL (ref 1.12–1.32)
Chloride: 105 mEq/L (ref 96–112)
Chloride: 107 mEq/L (ref 96–112)
Chloride: 95 mEq/L — ABNORMAL LOW (ref 96–112)
Creatinine, Ser: 5 mg/dL — ABNORMAL HIGH (ref 0.4–1.2)
Glucose, Bld: 119 mg/dL — ABNORMAL HIGH (ref 70–99)
Glucose, Bld: 122 mg/dL — ABNORMAL HIGH (ref 70–99)
Glucose, Bld: 82 mg/dL (ref 70–99)
HCT: 19 % — ABNORMAL LOW (ref 36.0–46.0)
HCT: 22 % — ABNORMAL LOW (ref 36.0–46.0)
HCT: 39 % (ref 36.0–46.0)
Hemoglobin: 11.9 g/dL — ABNORMAL LOW (ref 12.0–15.0)
Hemoglobin: 13.3 g/dL (ref 12.0–15.0)
Potassium: 3.4 mEq/L — ABNORMAL LOW (ref 3.5–5.1)
Potassium: 3.9 mEq/L (ref 3.5–5.1)
Potassium: 4.6 mEq/L (ref 3.5–5.1)
Sodium: 135 mEq/L (ref 135–145)
Sodium: 139 mEq/L (ref 135–145)
TCO2: 19 mmol/L (ref 0–100)
TCO2: 25 mmol/L (ref 0–100)

## 2010-11-18 LAB — BASIC METABOLIC PANEL
BUN: 15 mg/dL (ref 6–23)
BUN: 19 mg/dL (ref 6–23)
BUN: 22 mg/dL (ref 6–23)
BUN: 26 mg/dL — ABNORMAL HIGH (ref 6–23)
BUN: 28 mg/dL — ABNORMAL HIGH (ref 6–23)
CO2: 19 mEq/L (ref 19–32)
CO2: 27 mEq/L (ref 19–32)
CO2: 27 mEq/L (ref 19–32)
CO2: 32 mEq/L (ref 19–32)
Calcium: 7 mg/dL — ABNORMAL LOW (ref 8.4–10.5)
Calcium: 8.3 mg/dL — ABNORMAL LOW (ref 8.4–10.5)
Calcium: 8.6 mg/dL (ref 8.4–10.5)
Calcium: 9 mg/dL (ref 8.4–10.5)
Chloride: 108 mEq/L (ref 96–112)
Chloride: 94 mEq/L — ABNORMAL LOW (ref 96–112)
Chloride: 97 mEq/L (ref 96–112)
Chloride: 97 mEq/L (ref 96–112)
Chloride: 99 mEq/L (ref 96–112)
Creatinine, Ser: 4 mg/dL — ABNORMAL HIGH (ref 0.4–1.2)
Creatinine, Ser: 4.65 mg/dL — ABNORMAL HIGH (ref 0.4–1.2)
Creatinine, Ser: 4.99 mg/dL — ABNORMAL HIGH (ref 0.4–1.2)
Creatinine, Ser: 6.58 mg/dL — ABNORMAL HIGH (ref 0.4–1.2)
Creatinine, Ser: 7.8 mg/dL — ABNORMAL HIGH (ref 0.4–1.2)
GFR calc Af Amer: 11 mL/min — ABNORMAL LOW (ref >60)
GFR calc Af Amer: 11 mL/min — ABNORMAL LOW (ref >60)
GFR calc Af Amer: 14 mL/min — ABNORMAL LOW (ref >60)
GFR calc Af Amer: 8 mL/min — ABNORMAL LOW (ref >60)
GFR calc non Af Amer: 11 mL/min — ABNORMAL LOW (ref >60)
GFR calc non Af Amer: 5 mL/min — ABNORMAL LOW (ref >60)
GFR calc non Af Amer: 6 mL/min — ABNORMAL LOW (ref >60)
GFR calc non Af Amer: 9 mL/min — ABNORMAL LOW (ref >60)
GFR calc non Af Amer: 9 mL/min — ABNORMAL LOW (ref >60)
Glucose, Bld: 162 mg/dL — ABNORMAL HIGH (ref 70–99)
Glucose, Bld: 178 mg/dL — ABNORMAL HIGH (ref 70–99)
Glucose, Bld: 189 mg/dL — ABNORMAL HIGH (ref 70–99)
Glucose, Bld: 235 mg/dL — ABNORMAL HIGH (ref 70–99)
Glucose, Bld: 90 mg/dL (ref 70–99)
Potassium: 3.3 mEq/L — ABNORMAL LOW (ref 3.5–5.1)
Potassium: 4 mEq/L (ref 3.5–5.1)
Potassium: 4.2 mEq/L (ref 3.5–5.1)
Potassium: 4.5 mEq/L (ref 3.5–5.1)
Potassium: 4.7 mEq/L (ref 3.5–5.1)
Sodium: 133 mEq/L — ABNORMAL LOW (ref 135–145)
Sodium: 135 mEq/L (ref 135–145)
Sodium: 136 mEq/L (ref 135–145)
Sodium: 138 mEq/L (ref 135–145)

## 2010-11-18 LAB — CROSSMATCH
ABO/RH(D): O POS
Antibody Screen: NEGATIVE

## 2010-11-18 LAB — CARDIAC PANEL(CRET KIN+CKTOT+MB+TROPI)
Relative Index: 1.6 (ref 0.0–2.5)
Total CK: 1504 U/L — ABNORMAL HIGH (ref 7–177)
Troponin I: 5.55 ng/mL (ref 0.00–0.06)
Troponin I: 6.43 ng/mL (ref 0.00–0.06)

## 2010-11-18 LAB — IRON AND TIBC
Iron: 10 ug/dL — ABNORMAL LOW (ref 42–135)
Saturation Ratios: 11 % — ABNORMAL LOW (ref 20–55)
TIBC: 92 ug/dL — ABNORMAL LOW (ref 250–470)
UIBC: 82 ug/dL

## 2010-11-18 LAB — POCT I-STAT 4, (NA,K, GLUC, HGB,HCT)
Glucose, Bld: 186 mg/dL — ABNORMAL HIGH (ref 70–99)
HCT: 23 % — ABNORMAL LOW (ref 36.0–46.0)
HCT: 25 % — ABNORMAL LOW (ref 36.0–46.0)
HCT: 43 % (ref 36.0–46.0)
Hemoglobin: 8.5 g/dL — ABNORMAL LOW (ref 12.0–15.0)
Sodium: 139 mEq/L (ref 135–145)

## 2010-11-18 LAB — PROTIME-INR
INR: 1.05 (ref 0.00–1.49)
INR: 1.49 (ref 0.00–1.49)
Prothrombin Time: 17.9 seconds — ABNORMAL HIGH (ref 11.6–15.2)

## 2010-11-18 LAB — FECAL LACTOFERRIN, QUANT

## 2010-11-18 LAB — POCT I-STAT 3, VENOUS BLOOD GAS (G3P V)
Acid-Base Excess: 1 mmol/L (ref 0.0–2.0)
Bicarbonate: 22.7 mEq/L (ref 20.0–24.0)
O2 Saturation: 65 %
O2 Saturation: 99 %
TCO2: 23 mmol/L (ref 0–100)
pCO2, Ven: 46.2 mmHg (ref 45.0–50.0)
pH, Ven: 7.388 — ABNORMAL HIGH (ref 7.250–7.300)
pO2, Ven: 131 mmHg — ABNORMAL HIGH (ref 30.0–45.0)
pO2, Ven: 34 mmHg (ref 30.0–45.0)

## 2010-11-18 LAB — PLATELET COUNT: Platelets: 203 10*3/uL (ref 150–400)

## 2010-11-18 LAB — CLOSTRIDIUM DIFFICILE EIA

## 2010-11-18 LAB — APTT
aPTT: 30 seconds (ref 24–37)
aPTT: 32 seconds (ref 24–37)
aPTT: 33 seconds (ref 24–37)

## 2010-11-18 LAB — CREATININE, SERUM
Creatinine, Ser: 2.93 mg/dL — ABNORMAL HIGH (ref 0.4–1.2)
Creatinine, Ser: 5.13 mg/dL — ABNORMAL HIGH (ref 0.4–1.2)
GFR calc Af Amer: 10 mL/min — ABNORMAL LOW (ref >60)
GFR calc Af Amer: 19 mL/min — ABNORMAL LOW (ref >60)
GFR calc non Af Amer: 16 mL/min — ABNORMAL LOW (ref >60)
GFR calc non Af Amer: 8 mL/min — ABNORMAL LOW (ref >60)

## 2010-11-18 LAB — STOOL CULTURE

## 2010-11-18 LAB — DIFFERENTIAL
Band Neutrophils: 8 % (ref 0–10)
Blasts: 0 %
Lymphocytes Relative: 9 % — ABNORMAL LOW (ref 12–46)
Metamyelocytes Relative: 0 %
Promyelocytes Absolute: 0 %

## 2010-11-18 LAB — MAGNESIUM
Magnesium: 1.8 mg/dL (ref 1.5–2.5)
Magnesium: 1.9 mg/dL (ref 1.5–2.5)
Magnesium: 2 mg/dL (ref 1.5–2.5)

## 2010-11-18 LAB — FERRITIN: Ferritin: 1629 ng/mL — ABNORMAL HIGH (ref 10–291)

## 2010-11-18 LAB — HEPARIN LEVEL (UNFRACTIONATED)
Heparin Unfractionated: <0.1 IU/mL — ABNORMAL LOW (ref 0.30–0.70)
Heparin Unfractionated: <0.1 IU/mL — ABNORMAL LOW (ref 0.30–0.70)

## 2010-11-18 LAB — MRSA PCR SCREENING: MRSA by PCR: NEGATIVE

## 2010-11-18 LAB — PHOSPHORUS
Phosphorus: 4.4 mg/dL (ref 2.3–4.6)
Phosphorus: 7.5 mg/dL — ABNORMAL HIGH (ref 2.3–4.6)

## 2010-11-18 LAB — POCT I-STAT GLUCOSE
Glucose, Bld: 158 mg/dL — ABNORMAL HIGH (ref 70–99)
Operator id: 195151

## 2010-11-18 LAB — HEMOGLOBIN A1C: Hgb A1c MFr Bld: 6.4 % — ABNORMAL HIGH (ref 4.6–6.1)

## 2010-11-18 LAB — HEMOGLOBIN AND HEMATOCRIT, BLOOD
HCT: 20.1 % — ABNORMAL LOW (ref 36.0–46.0)
Hemoglobin: 7 g/dL — ABNORMAL LOW (ref 12.0–15.0)

## 2010-11-25 NOTE — Discharge Summary (Signed)
[name], [name]                 ACCOUNT NO.:  [id number]  MEDICAL RECORD NO.:  [id number]           PATIENT TYPE:  I  LOCATION:  6703                         FACILITY:  [facility]  PHYSICIAN:  Blanch Media, M.D.DATE OF BIRTH:  [birth year]  DATE OF ADMISSION:  11/09/2010 DATE OF DISCHARGE:  11/12/2010                              DISCHARGE SUMMARY   DISCHARGE DIAGNOSES: 1. Gastrointestinal bleed secondary to ulcer in the antrum. 2. End-stage renal disease, on hemodialysis, Monday, Wednesday, and     Friday. 3. Status post coronary artery bypass graft. 4. Diabetes type 2. 5. Hypertension.  DISCHARGE MEDICATIONS: 1. PhosLo 2001 mg by mouth three times daily with meals. 2. Aranesp 200 mcg IV, Wednesday hemodialysis. 3. Protonix 40 mg take 1 tablet by mouth twice daily x1 week, then 1     tablet daily. 4. Zemplar 5 mcg IV Wednesday hemodialysis. 5. Isosorbide mononitrate XR 30 mg take one half tablet by mouth at     bedtime. 6. Loperamide 2 mg p.o. b.i.d. p.r.n. diarrhea. 7. Novolin 70/30 20 to 25 units subcu twice daily, uses as a sliding     scale based on blood sugar. 8. Rena-Vite 1 tablet by mouth daily. 9. Simvastatin 40 mg p.o. daily.  DISPOSITION AND FOLLOWUP: 1. [name] was discharged from [facility] on November 12, 2010 in stable improved condition.  She has no other black tarry     stool, shortness of breath, or chest pain.  She needs to continue     to take Protonix 40 mg p.o. b.i.d. x1 week, then 40 mg p.o. daily,     afterwards for long-term.  She will need to follow up with her     primary care physician/Gastroenterology, Dr. Laurell Roof on     November 18, 2010 at 2:45 p.m.  At that time, he will need to check     her CBC to make sure that her hemoglobin is stable.  At her     discharge, her hemoglobin was 12.7. 2. Follow up with her blood pressure.  We held all of her blood     pressure medication during this hospital course in the setting  of     GI bleeding and her blood pressure has been stable in the 120 to     150.  However, there were two elevated blood pressure in the 160     and 180.  Once her hemoglobin is stable and if her blood pressure     continued to trend up, we recommend that her blood pressure get     restarted slowly. 3. Follow up on her CBG and adjust insulin dosing if needed.  CONSULTATION: 1. Nephrology for hemodialysis. 2. GI, Dr. Juanda Chance.  Please see endoscopy and under transcription for     full detail note.  PROCEDURE PERFORMED:  Endoscopy on November 10, 2010, shows old ulcer in the antrum.  Otherwise normal examination.  Benign appearing medium- sized prepyloric ulcer with stigmata of recent bleeding, no evidence of outlet obstruction.  Biopsy result shows ulcerated gastric mucosa.  No Helicobacter pylori,  dysplasia, or malignancy.  ADMISSION HISTORY:  [name] is 70 year old woman with past medical history of status post CABG for three-vessel disease, CHF, end-stage renal disease, on hemodialysis, Monday, Wednesday, Friday, diabetes, COPD, was transferred from [facility] for altered mental status during dialysis, and which she did receive full dialysis on day of admission.  She reported not feeling well since Saturday November 07, 2010. She reports nausea and vomiting and black diarrhea stool that are (darker than black).  She also reported vomiting of black vomitus as well.  However, this still provided by the patient is unclear.  She has also been taking Goody powder for her stomach pain as well.  Hemoglobin was found to be 6.6 at [facility], therefore, she was transferred to [facility] for further evaluation and treatment.  She denies any dizziness, weakness, shortness of breath, fever, chest pain, or urinary symptoms.  She states that she knows that her blood sugar was running low and said that her CBG is usually in 110-120.  PHYSICAL EXAMINATION ON ADMISSION:  VITAL SIGNS:   Temperature 98.2, blood pressure 106/86, pulse 70, respiration 18, O2 sat 99% on room air. GENERAL:  Alert, cooperative, and appears stated age. HEAD:  Normocephalic without obvious abnormality, atraumatic. EYES:  Conjunctiva/cornea clear.  Pupils equal, round, reactive to light, and accommodation.  Extraocular movement intact.  Fundi benign. THROAT:  Lips, mucosa, and tongue normal.  Teeth and gums are normal. LUNGS:  Clear to auscultation bilaterally.  No wheezes or crackles. HEART:  Regular rate and rhythm.  S1-S2 normal plus 4/6 systolic murmur. ABDOMEN:  Soft, nontender.  Bowel sounds normal.  No masses. No organomegaly. EXTREMITIES:  No edema.  Right lower extremity wound with black eschar 2 x 10 cm with surrounding edema (she fell 2 weeks prior to admission and did not go to the ED or a doctor).  Open wound without drainage or erythema.  Pulses 2+ and symmetric. NEUROLOGIC:  Alert and oriented x3, normal strength and tone, normal symmetric reflexes, normal coordination and gait.  ADMISSION LABS:  Sodium 140, potassium 3.1, chloride 95, bicarb 32, glucose 140, BUN 25, creatinine 2.38, GFR 25, anion gap 16, calcium 9.6, albumin 3.3, total protein 5.7, troponin 0.05, myoglobin 296.  NT-proBNP 31,600, AST 23, ALT 28, alk phos 69, serum osmolality 275.  WBC 8.7, hemoglobin 6.6, hematocrit 19.2, MCV 98, platelet 222, differential within normal limits.  Red blood cell morphology +1 aniso, +1 ellipto, +1 polychromasia.  PT 11.1, PTT 25.4, INR 1.1.  ABG from [place] shows pH 7.53, pCO2 42, pO2 65, bicarb 35.1.  HOSPITAL COURSE: 1. GI bleed.  Hemoglobin on admission was 6.6 with a baseline between     9-10.  Likely upper GI bleed given her black stool.  However, a     lower GI bleed could also be possible.  The patient was admitted to     Step-Down Unit and she received 3 units of packed red blood cells     with post transfusion on day of discharge was 12.7.  Dr. Juanda Chance     with GI  examined and evaluated the patient and the patient was     taken to OR for endoscopy on November 10, 2010, which shows ulcer in     the antrum with a normal biopsy report, which only shows ulcerated     gastric mucosa, but no dysplasia, malignancy, or H. pylori.  The     patient did receive 80 mg IV bolus of Protonix  at [place] and then     was started on GTT at 8 mg/hr.  Postendoscopy, the patient was     started on Protonix 40 mg p.o. b.i.d. and she should continue for 1     week, then changed to p.o. daily for long-term.  The patient will     have close follow up with her GI doctor, Dr. Jennye Boroughs on November 18, 2010 at 2:45 p.m.Marland Kitchen  At that time, he will need to recheck her     CBC to make sure that the patient's hemoglobin is stable and that     she is not actively bleeding.  The patient was also instructed that     to stop taking Goody powders.  If she needs medications for pain,     she should see her primary care doctor for pain medication. 2. End-stage renal disease, on hemodialysis, Monday, Wednesday, and     Friday.  The patient received hemodialysis during this hospital     admission.  Dr. Hyman Hopes evaluate the patient and we continue Zemplar,     Epogen, Venofer, and Rena-Vite. 3. Status post CABG.  Stable, no complaints of chest pain.  However     given her risk factor of CABG, we did cycle her cardiac enzymes     with troponin of 0.23, 0.37, 0.22.  Her EKG did not show any ST     elevation or T-wave changes.  She has some nonspecific T-wave     changes.  The patient did not complain of any chest pain during     this admission.  Her CK-MB were within normal limits.  We continue     her simvastatin and hold aspirin and isosorbide in the setting of     GI bleed. 4. Hypertension.  Her blood pressure was adequately controlled, except     for two episodes of elevated blood pressure in the 160-180 systolic     blood pressure.  However, the patient's medications including      metoprolol, lisinopril, and Norvasc were held in the setting of her     GI bleed.  In addition, the patient was instructed to stop taking     those medications as an outpatient until she follow up with Dr.     Jennye Boroughs.  If her blood pressure continues to be elevated, he     may need to restart her blood pressure one by one slowly. 5. Diabetes type 2, stable.  Check hemoglobin A1c.  The patient     received SSI sensitive and CBG q.a.c. and q.h.s.  Her CBG range     from 101-294 during hospital course. 6. VTE prophylaxis.  The patient received SCDs.  DISCHARGE VITALS:  Temperature 97.3, pulse 76, respirations 18, blood pressure 96/64, O2 sat 96% on room air.  DISCHARGE LABS:  Sodium 134, potassium 4.0, chloride 96, bicarb 28, glucose 207, BUN 21, creatinine 4.18, calcium 9.2, albumin 3.0, phosphorus 2.5.  WBC 8.6, hemoglobin 12.7, hematocrit 38.8, platelet count 198.    ______________________________ Carrolyn Meiers, MD   ______________________________ Blanch Media, M.D.    MH/MEDQ  D:  11/12/2010  T:  11/13/2010  Job:  [id number]  cc:   Laurell Roof  Electronically Signed by Carrolyn Meiers MD on 11/16/2010 01:38:56 PM Electronically Signed by Blanch Media M.D. on 11/25/2010 03:01:33 PM

## 2011-01-12 NOTE — Op Note (Signed)
[name], [name]                 ACCOUNT NO.:  [id number]   MEDICAL RECORD NO.:  [id number]          PATIENT TYPE:  AMB   LOCATION:  SDS                          FACILITY:  [facility]   PHYSICIAN:  VDurene Cal IV, MDDATE OF BIRTH:  [birth year]   DATE OF PROCEDURE:  04/23/2008  DATE OF DISCHARGE:                               OPERATIVE REPORT   PREOPERATIVE DIAGNOSIS:  Nonmaturing left upper arm fistula.   POSTOPERATIVE DIAGNOSIS:  Nonmaturing left upper arm fistula.   PROCEDURE PERFORMED:  1. Ultrasound access, left upper arm fistula.  2. Left-sided fistulogram.  3. Percutaneous transluminal venoplasty, left cephalic vein.   INDICATIONS:  This 70 year old has undergone left upper arm fistula  placement that has not matured appropriately.  On ultrasound, she was  found to have stenosis just distal to the arterial venous anastomosis.  She comes in today for a fistulogram, possibility of an intervention.  Risk and benefits were discussed and informed consent was signed.   PROCEDURE:  The patient was identified in the holding area and taken to  room-H.  She was placed supine on the table.  Left arm was prepped and  draped in standard sterile fashion.  A time-out was called.  Lidocaine  1% was used for local anesthesia.  The cephalic vein fistula was  visualized with ultrasound and found to be patent and easily  compressible.  That was accessed with a micropuncture needle under  ultrasound guidance.  Next, an 0.067mandril wire was advanced into the  fistula under fluoroscopic visualization and a 5-French micropuncture  sheath was placed.  Through the micropuncture sheath, left arm  fistulogram was performed.   FINDINGS:  There is a high-grade greater than 90% stenosis approximately  1 cm proximal to the arterial venous anastomosis.  There are small  irregularities throughout the cephalic vein, however, no hemodynamically  significant lesions.  There is no significant central  stenosis.   INTERVENTION:  At this point of time, decision was made to intervene at  the stenosis neither arteriovenous anastomosis.  Over a Bentson wire,  the micropuncture sheath was exchanged out for a 5-French sheath.  The  patient was given 3000 units of heparin.  A 0.014 stabilizer wire was  used to cross the arterial venous anastomosis.  Balloon venoplasty was  then performed first with a 1.5-mm balloon.  This was held up for 1  minute and taken to 10 atmospheres.  Follow up arteriogram reveals  suboptimal result and a 2.5-mm balloon was used.  Again a suboptimal  result was obtained and a 4-mm x 2-mm balloon was used followed by a 5 x  2 balloon.  Following venoplasty with a 5-mm balloon taken to 8  atmospheres and final injection was performed, which revealed resolution  of the stenosis of arterial venous anastomosis.  There was an excellent  thrill within the fistula.  At this point of time, the decision was  made to terminate the procedure.  Sheaths and wires were removed.  The  patient was taken to holding area in stable condition.  She did have a  small  hematoma at her access site.   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           ______________________________  V. Charlena Cross, MD  Electronically Signed     VWB/MEDQ  D:  04/23/2008  T:  04/24/2008  Job:  [id number]   cc:   Garnetta Buddy, M.D.

## 2011-01-12 NOTE — Procedures (Signed)
CEPHALIC VEIN MAPPING   INDICATION:  Cephalic vein mapping prior to placement of dialysis  access.   HISTORY:  End-stage renal disease.  Patient is not currently on dialysis.   EXAM:   The right cephalic vein is compressible.   Diameter measurements range from 0.29 to 0.22 cm from the axilla distal  to the antecubital fossa.  Cephalic vein diameter is less than 0.15 cm  in the forearm.   The left cephalic vein is compressible.   Diameter measurements range from 0.29 to 0.24 cm from the axilla to the  antecubital fossa.  Distal to the antecubital fossa, the left cephalic  vein diameter measurements are less than 0.15 cm.   See attached worksheet for all measurements.   IMPRESSION:  Patent bilateral cephalic veins which are of acceptable  diameter for use as a dialysis access site proximal to the antecubital  fossa bilaterally.   ___________________________________________  V. Charlena Cross, MD   MC/MEDQ  D:  12/25/2007  T:  12/25/2007  Job:  [id number]

## 2011-01-12 NOTE — Discharge Summary (Signed)
NAMEHAILA, DENA                 ACCOUNT NO.:  [id number]   MEDICAL RECORD NO.:  [id number]          PATIENT TYPE:  INP   LOCATION:  6739                         FACILITY:  [facility]   PHYSICIAN:  Cecille Aver, M.D.DATE OF BIRTH:  1941/04/21   DATE OF ADMISSION:  02/07/2008  DATE OF DISCHARGE:  02/13/2008                               DISCHARGE SUMMARY   DISCHARGE DIAGNOSES:  1. Progressive chronic kidney disease with new end-stage renal      disease.  2. Congestive heart failure.  3. Hypertension.  4. Pneumonia.  5. Diabetes mellitus.  6. Anemia.  7. Secondary hyperparathyroidism.   PROCEDURES:  1. On February 08, 2008, placement of tunnel dialysis catheter via the      right internal jugular vein by Dr. Richarda Overlie.  2. On February 08, 2008, transthoracic echocardiogram.   SUMMARY:  1. Overall, left ventricular systolic function was vigorous.  Left      ventricular ejection fraction was estimated between 70% and 75%.      There were no left ventricular regional wall motion abnormalities.      Left ventricular wall thickness was mildly increased.  2. Aortic valve thickness was moderately increased.  The aortic valve      was mildly-to-moderately calcified.  There was mildly reduced      aortic valve leaflet excursion.  Findings were consistent with mild      aortic valve stenosis.  There was trivial aortic valvular      regurgitation.  3. There was mild mitral valvular regurgitation.  4. The left atrium was mildly dilated.  5. The estimated peak right ventricular systolic pressure was markedly      increased, estimated peak right ventricular systolic pressure was      70.  6. The pulmonary artery was mildly dilated.   CONSULTATIONS:  Interventional Radiology, Adam R. Henn, MD   HISTORY OF PRESENT ILLNESS:  This is a 70 year old African American  female with multiple medical problems most notable for long-standing,  poorly-controlled diabetes, hypertension, progressive  chronic kidney  disease stage IV-V, and ongoing tobacco abuse.  She presented to an  outside hospital with feelings of generalized malaise, fatigue, and some  shortness of breath with hemoptysis.  She was admitted at [facility], found to be in pulmonary edema as well as to have progressive  renal failure.  She was given IV diuretics and had not diuresed well and  was with sepsis and subsequently transferred here to [facility] for  further evaluation and management for her progressive renal failure and  refractory congestive heart failure.  The patient is without any current  complaints.   ADMISSION LABORATORY DATA:  WBC 9.8, hemoglobin 8.6, hematocrit 25.8,  and platelet 216,000.  Sodium 134, potassium 4.4, chloride 105, CO2 16,  glucose 74, BUN 91, and creatinine 5.68.  Total bilirubin 0.6, alkaline  phos 50, AST is 36, and ALT 45.  Total protein 6.1, albumin 3.3, calcium  8.6, CK-MB 3.7, iron 41, TIBC 236, percent saturation 17, and UIBC 195.  Vitamin B12 750, folate 2080 units, and ferritin  513.   DIAGNOSTIC RADIOLOGICAL EXAMINATIONS:  On February 09, 2008, two-view chest  x-ray.   IMPRESSION:  Right lung infiltrate, question pneumonia versus asymmetric  edema.   HOSPITAL COURSE:  1. Progressive chronic kidney disease with new end-stage renal      disease.  The patient was admitted.  A tunneled catheter was placed      by interventional radiology as described above.  She initially      received IV diuretic therapy; however, did not respond well on      hemodialysis until hemodialysis could be started.  After the      dialysis catheter was placed, the patient began hemodialysis      treatments which she seemed to tolerate well without difficulty.      At the time of discharge, her treatment time had been increased to      4 hours.  Her blood flow rate had been increased to 300.  Her vital      signs remained fairly stable during her hemodialysis treatment with      heart  rate ranging from the 70s to 80s and systolic blood pressure      ranging from 120s to 60s.  2. Congestive heart failure.  As stated as above, the patient was      placed on diuretic therapy until hemodialysis could be initiated.      Large amount of volume was ultrafiltrated with hemodialysis.      Average amount of volume ultrafiltrated was 3 L.  The patient also      underwent a 2-D echo for further evaluation again with results as      stated above.  Clinically, her congestive heart failure had      improved prior to discharge.  3. Hypertension.  The patient's outpatient hypertensive medications      were held during her admission.  Her blood pressure remained very      well controlled with ultrafiltration that occurred during      hemodialysis treatments.  She remained off of her blood pressure      medications.  During her stay and prior to discharge, her blood      pressure was 109/60.  4. Pneumonia.  The patient was found to have pneumonia via chest x-ray      as described above.  The patient was initially placed on IV      antibiotics, but was transitioned to oral Avelox therapy towards      the end of her hospitalization which she will complete 6 more days      as an outpatient.  5. Diabetes mellitus.  The patient continued her outpatient Avandia      therapy.  She was also placed on a carbohydrate modified diet and      was placed on sliding scale insulin with Accu-Cheks before meals      and nightly.  Her blood glucose levels were fairly well controlled      during her stay, and prior to discharge her blood glucose levels      ranged from 143-206.  6. Anemia.  The patient continued Epogen and iron therapy throughout      her hospitalization which she seemed to tolerate well and without      difficulty.  Her hemoglobin remained fairly stable in the 8's      throughout her stay.  7. Secondary hyperparathyroidism.  The patient was initiated on low-      dose vitamin  D therapy  during her hospitalization.  She required no      phosphate binder therapy as her phosphorus levels remained at goal      during her stay.   DISCHARGE MEDICATIONS:  1. Nephro-Vite 1 tablet p.o. daily.  2. Avandia 4 mg p.o. daily.  3. Atorvastatin 20 mg p.o. q. evening.  4. Imdur 60 mg p.o. nightly.  5. Avelox 400 mg one p.o. daily x6 days.   HEMODIALYSIS MEDICATIONS:  1. Zemplar 1 mcg IV q. hemodialysis treatment.  2. Venofer 100 mg IV q. hemodialysis treatment x2 then 50 mg IV q.      week.  3. Epogen 28,000 units IV q. hemodialysis treatment.  4. Tight heparin x2 weeks and then change to standard.   DISCHARGE INSTRUCTIONS:  The patient will resume a renal diabetic diet  with a 1200 mL fluid restriction daily.  No showers or swimming at this  time.  She must keep her catheter site dry and sponge bathe only.  Activity as tolerated.  She is to report to the [facility]  every Tuesday, Thursday, and Saturday at 11 o'clock for her scheduled  outpatient hemodialysis appointment.   HEMODIALYSIS INSTRUCTIONS:  The patient's treatment time is 4 hours.  She will be placed on an Opti 180 dialyzer.  Her dialysate bath is 2  potassium and 2.5 calcium.  Estimated dry weight is approximately 45.0  kg.  Blood flow rate of 350 x1 treatment then progress to 400.  Dialysate flow rate 700 x1 treatment then progress to 800.  Variable  sodium will be placed on 148 linear.  We are to use her right IJ  PermCath that this time.  Estimated use of her AV fistula will be in  August.  Please obtain an Accu-Chek p.r.n., hemoglobin A1c quarterly  with monthly labs, and an intact PTH with monthly labs this month.  Please obtain a hemoglobin level pretreatment nonstat q. treatment x3  for further hemoglobin monitoring.  Please note it took approximately 45  minutes to prepare this discharge.      Tracey P. Sherrod, NP    ______________________________  Cecille Aver, M.D.     TPS/MEDQ  D:  02/13/2008  T:  02/14/2008  Job:  [id number]   cc:   [facility] in [place]

## 2011-01-12 NOTE — Assessment & Plan Note (Signed)
OFFICE VISIT   EMRICH, Artemis A  DOB:  [birth year]                                        December 24, 2009  CHART #:  [id number]   CURRENT PROBLEMS:  1. Status post CABG x3 February 2011, for severe three-vessel,      unstable angina, and CHF.  2. 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  3. Diabetes.  4. COPD, reformed smoker.  5. Postoperative left pleural effusion, resolved.   PRESENT ILLNESS:  The patient returns with a chest x-ray to review her  recurrent postoperative left pleural effusion.  She is recently admitted  for shortness of breath and underwent a thoracentesis of approximately  400 mL of serosanguineous fluid.  At the time of discharge, her chest x-  ray showed significantly improved aeration of the left lung with  decreased pleural effusion.  She has continued to do well at home and  has not smoked.  She has no angina.  The surgical incisions are well  healed.   A chest x-ray performed today before her office visit demonstrates clear  lung fields, no residual pleural effusion, no evidence of CHF.   IMPRESSION AND PLAN:  The patient has done well following her three-  vessel bypass grafting.  The patient does have a soft systolic murmur  and has mild aortic stenosis with a  gradient of 10-15 mmHg.  This will be followed by her cardiologists at  [facility].  She will return here as needed.   Kerin Perna, M.D.  Electronically Signed   PV/MEDQ  D:  12/24/2009  T:  12/25/2009  Job:  [id number]   cc:   [facility]

## 2011-01-12 NOTE — Op Note (Signed)
NAMEZAMYA, CULHANE                 ACCOUNT NO.:  [id number]   MEDICAL RECORD NO.:  [id number]          PATIENT TYPE:  AMB   LOCATION:  SDS                          FACILITY:  [facility]   PHYSICIAN:  Juleen China IV, MDDATE OF BIRTH:  1941-02-19   DATE OF PROCEDURE:  01/12/2008  DATE OF DISCHARGE:  01/12/2008                               OPERATIVE REPORT   PREOPERATIVE DIAGNOSIS:  Chronic kidney disease.   POSTOPERATIVE DIAGNOSIS:  Chronic kidney disease.   PROCEDURE PERFORMED:  Left upper arm arteriovenous fistula.   ANESTHESIA:  MAC.   FINDINGS:  Vein accepted a #3.5 coronary dilator.   ESTIMATED BLOOD LOSS:  Minimal.   COMPLICATIONS:  None.   PROCEDURE IN DETAIL:  The patient was identified in the holding area,  taken to room 6, and she was placed supine on the table.  The left arm  was prepped and  draped in standard sterile fashion.  Time-out was  called and antibiotics were given.  I used ultrasound to identify the  course of the cephalic vein.  1% lidocaine was used for local  anesthesia.  A transverse incision was made above the antecubital  crease.  The cephalic vein was identified in the operative field and  circumferentially dissected free.  A vessel loop was placed and then the  vein was mobilized proximally and distally.  A ink pen was used to mark  the vein for appropriate orientation.  Next, the artery was mobilized.  The fascia was opened sharply and the brachial artery was identified.  It was encircled with a vessel loop and then immobilized proximally and  distally.  At this point in time, the patient was given systemic  heparinization.  The vein was transected distally and the end was  ligated with a 3-0 silk tie and a clip.  Vein was then flushed with  heparinized saline.  It was dilated up to a #3.5 coronary dilator.  At  this point in time, the artery was occluded with vascular clamps.  A #11  blade was used to make an longitudinal arteriotomy.  This was  extended  with Potts scissors.  The vein was then cut to the appropriate length  and the end was spatulated to fit the size of the arteriotomy.  The  anastomosis was then performed using a running 6-0 Prolene.  Prior to  this anastomosis, the artery was flushed in antegrade and retrograde  fashion.  Once the vascular clamps were release, I inspected the  fistula.  I was not satisfied with the lateral heel of the graft.  It  appeared that the vein had been narrowed by a suture.  For that reason,  the artery was then reoccluded.  The anastomosis was partially taken  down, for about a one-third of the anastomosis.  A 6-0 Prolene was then  used to complete the anastomosis.  At this time, the artery was then  flushed appropriately and the clamps were released.  The kinking at the  anastomosis was alleviated.  At this point in time, the heparin was  reversed with 25 mg  of protamine.  The wound was then copiously  irrigated.  The patient had a good palpable thrill  under the fistula up to the mid upper arm.  She had a palpable radial  pulse.  Again, the wound was again irrigated.  Deep tissue was closed  with 3-0 Vicryl.  Skin was closed with 4-0 Vicryl..  The patient  tolerated the procedure well.  There were no complications.           ______________________________  V. Charlena Cross, MD  Electronically Signed     VWB/MEDQ  D:  01/12/2008  T:  01/13/2008  Job:  [id number]

## 2011-01-12 NOTE — Assessment & Plan Note (Signed)
OFFICE VISIT   Baker, Cassie A  DOB:  [birth year]                                       12/25/2007  UJWJX#:[id number]   REASON FOR VISIT:  Evaluate for new access.   HISTORY:  This is a 70 year old right-handed female with chronic kidney  disease not yet on dialysis I am seeing at request of Dr. Hyman Hopes for  evaluation of her dialysis access.  The patient is diabetic, she also  has hypertension.  I have been requested to place a primary AV fistula  if possible or graft if fistula not possible.   REVIEW OF SYSTEMS:  GENERAL:  Negative for fever, chills.  EYES:  No vision complaints.  EARS NOSE, MOUTH, THROAT:  No hearing loss.  No sore throat.  CARDIOVASCULAR:  Negative for chest pain.  RESPIRATORY:  Negative for cough, wheezing.  ABDOMEN:  Negative.  Neurologic is negative.  Dermatologic is negative.   PAST MEDICAL HISTORY:  Diabetes, hypertension, chronic kidney disease,  hyperlipidemia, tobacco abuse.   FAMILY HISTORY:  Cardiovascular disease in grandmother.   SOCIAL HISTORY:  She is widowed, has history of smoking.  Does not drink  alcohol.   MEDICATIONS:  Include Toprol 25 mg per day.  Plavix 75 mg per day,  Avandia 4 mg per day, metformin 500 mg per day, Altace 25 mg per day,  Caduet 5/20 per day.  Nephro-Vite daily.  Singular p.r.n.  Ramipril 2.5  mg 2 in the morning 1 at night.   ALLERGIES:  Are codeine, sulfa.   PHYSICAL EXAMINATION:  Blood pressures 129/58, pulse 56, respirations  16, O2 saturation 96%.  General:  Well-appearing in no acute stress.  Cardiovascular is regular rhythm.  Pulmonary:  Lungs clear bilaterally.  Abdomen is soft, nontender and extremities well perfused.  Neuro is  cranial II-XII grossly intact.  No gross motor deficits.  She is alert  and x3.   DIAGNOSTICS:  Vein mapping reveals her left cephalic vein 0.26-0.29  above the antecubital crease.   ASSESSMENT/PLAN:  A 70 old female with chronic kidney disease  needing  permanent access.   PLAN:  Had a long conversation with family with the patient regarding  her options.  At this time I feel that reason proceed with a left upper  arm fistula.  I gave her approximately 68-85% chance of maturity.  If at  that time I feel occur on the vein is not a going to be a good conduit,  I would place a graft in her forearm.  The risk of not maturing as well  as the risk of steal were discussed with the patient.  She is scheduled  undergo left upper arm fistula on Thursday May 7.   Jorge Ny, MD  Electronically Signed   VWB/MEDQ  D:  12/25/2007  T:  12/26/2007  Job:  [id number]   cc:   Garnetta Buddy, M.D.

## 2011-01-12 NOTE — Assessment & Plan Note (Signed)
OFFICE VISIT   PECH, Donnalynn A  DOB:  1941-04-20                                        November 19, 2009  CHART #:  [id number]   CURRENT PROBLEM:  1. 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  2. Hypertension.  3. Chronic renal failure, on hemodialysis Monday, Wednesday, Friday.  4. Insulin-dependent diabetes.   PRESENT ILLNESS:  The patient is a 70 year old African American female  who returns for first office visit after undergoing bypass surgery.  She  was hospitalized in February with acute onset of CHF, was found to have  congestive heart failure, pulmonary edema, and positive cardiac enzymes.  The cardiac cath demonstrated EF of 50% with 95% stenosis of a dominant  right coronary, total occlusion of the circumflex, and moderate stenosis  70% of the LAD.  She underwent left IMA grafting to her LAD and vein  grafts to the circumflex, marginal, and posterior descending.  Postoperatively, she had a slow recovery due to her underlying renal  failure, on dialysis, and COPD from active smoking.  She had anemia and  required transfusion therapy and supplemental oxygen.  She was  discharged to a nursing home in [facility] on aspirin, Advair  Diskus, iron, Ventolin inhaler, Lopressor 25 b.i.d., NPH insulin 70/30  b.i.d., Zocor 40 mg, and renal vitamins.  Since returning home, she is  gradually improved her strength.  Her incisions have healed well and she  has no recurrent symptoms of CHF or symptoms of angina.  She is able to  walk and feels ready to be discharged to her home.   PHYSICAL EXAMINATION:  Blood pressure 164/70, pulse 80, respirations 18,  saturation 96%.  She is alert and pleasant.  Breath sounds are slightly  diminished at the left base, otherwise clear.  The sternum is stable and  well healed.   Cardiac rhythm is regular.  She has a grade 1-2/6 systolic  murmur at the right upper sternal border.  She is felt to have mild  aortic stenosis at the time of her echo and cath.  Her leg incisions are  healing well and she has no pedal edema.   PA and lateral chest x-ray shows some mild atelectasis of the left base.  There is no CHF.  The sternal wires are intact and well aligned.   IMPRESSION AND PLAN:  The patient has made a good recovery now 1 month  after surgery.  I feel she is strong enough to return home and she was  provided prescriptions so that she can continue her current medications  as well as additionally resuming her lisinopril and Norvasc for blood  pressure control, which she had been taking before her admission and  surgery.  She will be on Lopressor 25 b.i.d., lisinopril 20 daily,  Norvasc 5 mg daily, aspirin 81 mg daily, Zocor 20 mg daily, and her  insulin and renal vitamins.  I plan on seeing her back in approximately  3 weeks to assess her progress.  She was cautioned not to lift any thing  more than 10 pounds and she was cautioned not to drive except for short  distances to dialysis during the daylight  hours.   Kerin Perna, M.D.  Electronically Signed   PV/MEDQ  D:  11/19/2009  T:  11/20/2009  Job:  [id number]

## 2011-01-12 NOTE — Assessment & Plan Note (Signed)
OFFICE VISIT   SOLOW, Bedelia A  DOB:  [birth year]                                       05/27/2008  UXLKG#:[id number]   HISTORY:  This is a 70 year old female with end-stage renal disease who  underwent left upper arm AV fistula performed on 01/12/2008.  This was  not maturing and she underwent percutaneous intervention on April 23, 2008.  At that time, a stenosis within her cephalic vein was identified  in the successfully dilated.  She comes back in today for followup.  She  has undergone  dialysis through her fistula, as it is functional at this  point time.  She is scheduled to get her catheter out this coming  Wednesday.   PHYSICAL EXAM:  Vital Signs:  Blood pressure 171/55, pulse 73.  General:  Well appearing, no distress.  Cardiovascular:  Regular rate and rhythm.  Left arm shows palpable fistula with excellent thrill in the upper arm.   ASSESSMENT AND PLAN:  Status post cephalic vein venoplasty for non-  maturing fistula.   PLAN:  The patient doing well now.  Hopefully, she will have excellent  result with her fistula.  I told that if she has trouble with flow rates  to give me a call as we may need to reevaluate her cephalic vein should  she have trouble.   Jorge Ny, MD  Electronically Signed   VWB/MEDQ  D:  05/27/2008  T:  05/28/2008  Job:  [id number]   cc:   Garnetta Buddy, M.D.

## 2011-01-12 NOTE — Assessment & Plan Note (Signed)
OFFICE VISIT   Vensel, Daylen A  DOB:  03-02-1941                                       04/08/2008  UEAVW#:[id number]   REASON FOR VISIT:  Nonmaturing fistula.   HISTORY:  This is a 70 year old female with end-stage renal disease who  is status post left upper arm arteriovenous fistula performed on  01/12/2008.  The patient recently underwent attempted access, however  she was not able to dialyze through it, she is sent to me for  evaluation.  On exam, blood pressure is 164/71, pulse 77, she is in no  acute distress, she has a palpable thrill within her fistula beginning  at the antecubital crease, it diminishes as it goes up the shoulder.   DIAGNOSTIC STUDIES:  Vein mapping was performed today, this reveals a  proximal stenosis with velocities of 613/242 just proximal to the  anastomosis.  There is also a small branch.   ASSESSMENT/PLAN:  Nonmaturing fistula.   Plan:  The patient is scheduled for a fistulogram to be done on August  25th.  I will plan on using ultrasound to identify the area of luminal  narrowing, I will most likely have to access the fistula up near the  shoulder and work in an antegrade fashion, because the stenosis is just  proximal to the anastomosis.  We will try to coordinate to have the  patient done as a first case and get her dialyzed later on that  afternoon.   Jorge Ny, MD  Electronically Signed   VWB/MEDQ  D:  04/08/2008  T:  04/09/2008  Job:  [id number]   cc:   Garnetta Buddy, M.D.

## 2011-01-12 NOTE — Procedures (Signed)
VASCULAR LAB EXAM   INDICATION:  Duplex of the right AV fistula.  Immature AV fistula.   HISTORY:  Diabetes:  Yes.  Cardiac:  Hypertension:  Yes.   EXAM:  Duplex of the right AV fistula.   IMPRESSION:  1. High-grade stenosis noted at the proximal arteriovenous fistula.  2. A branch noted at the upper arm of the cephalic vein.   ___________________________________________  V. Charlena Cross, MD   MG/MEDQ  D:  04/08/2008  T:  04/08/2008  Job:  [id number]

## 2011-01-12 NOTE — H&P (Signed)
[name], [name]                 ACCOUNT NO.:  [id number]   MEDICAL RECORD NO.:  [id number]          PATIENT TYPE:  INP   LOCATION:  6739                         FACILITY:  [facility]   PHYSICIAN:  Terrial Rhodes, M.D.DATE OF BIRTH:  05/10/1941   DATE OF ADMISSION:  02/07/2008  DATE OF DISCHARGE:                              HISTORY & PHYSICAL   CHIEF COMPLAINTS:  Weakness and shortness of breath.   HISTORY OF PRESENT ILLNESS:  [name] is a 70 year old African American  female with multiple medical problems most notable for long-standing  poorly controlled diabetes, hypertension, progressive chronic kidney  disease stage IV-V, and ongoing tobacco abuse.  She presented to an  outside hospital with feelings of generalized malaise, fatigue, and some  shortness of breath and hemoptysis.  She was admitted at [facility], found to be in pulmonary edema as well as to have progressive  renal failure.  She was given IV diuretics and had not diuresed well and  she was subsequently transferred here to [facility] for further  evaluation and management of her progressive renal failure and  refractory congestive heart failure.  The patient is without any current  complaints.  She has allergies to CODEINE and SULFA.   PAST MEDICAL HISTORY:  1. Diabetes mellitus x20 years, poorly controlled, complicated by      retinopathy, neuropathy, and nephropathy.  2. Hypertension for 20 years.  3. Progressive chronic kidney disease stage IV-V secondary to diabetes      mellitus and hypertension.  4. Coronary artery disease.  5. Dyslipidemia.  6. Tobacco abuse ongoing 40-pack year plus tobacco history.  7. Anemia of chronic disease.  8. History of peptic ulcer disease.   CURRENT MEDICATIONS:  1. Furosemide 80 mg IV q.6.  2. Metoprolol 25 mg daily.  3. Plavix 75 mg daily.  4. Avandia 4 mg daily.  5. Caduet 5/20 one a day.  6. Nephro-Vite one a day.  7. Singulair p.r.n.  8.  Hydrochlorothiazide 50 a day.  9. Imdur 60 mg a day.  10.Labetalol 100 mg q.12.  11.Hydralazine 25 mg b.i.d.  12.Ergocalciferol 50,000 units p.o. daily.   FAMILY HISTORY:  Mother is alive at age 43 in good health.  Father died  in his 109s from complications of diabetes and hypertension.  She has no  siblings.  She has no family history of kidney disease.   SOCIAL HISTORY:  She is a widow.  She is without children.  She lives in  [place] alone.  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  She  has a 40 plus year tobacco history, currently smoking a pack a day, but  none for the last several days and she has been ill.  No history of  alcohol or drug use.   REVIEW OF SYSTEMS:  As per HPI.  She has had some generalized malaise  and chills.  OPTHALMIC:  No blurred vision or photophobia, but has had  tinnitus.  CARDIAC:  Has not had any chest pain or palpitations or  orthopnea.  PULMONARY:  Has had some shortness  of breath, cough with  hemoptysis, or dyspnea on exertion.  GI:  Has had nausea but no  vomiting.  No hematochezia, melena, or bright red blood per rectum.  GU:  No dysuria, pyuria, hematuria, urgency, frequency, retention, but has  had nocturia x2-3.  ENDOCRINE:  Has had polydipsia, but no polyuria or  heat and cold intolerance.  NEUROLOGIC:  Has had numbness of fingers of  her left hand.  MUSCULOSKELETAL:  Has not had any arthralgias or  myalgias.  DERMATOLOGIC:  No rashes, lumps, or bumps.  All other systems  negative.   PHYSICAL EXAM:  GENERAL:  This is a well-developed thin female lying in  bed in no apparent distress who appears withdrawn but is appropriate.  VITAL SIGNS:  Temperature 98.6, pulse 71, blood pressure 138/64,  respiratory 18, and pulse ox 93% on 4 L.  HEENT:  Head normocephalic and atraumatic.  Extraocular muscles intact.  No icterus.  Oropharynx is without lesions.  NECK:  JVD bilaterally.  No lymphadenopathy or bruits.  LUNGS:  Scattered rales  throughout, left greater than right.  CARDIAC:  Regular rate and rhythm.  No precordial rub appreciated.  ABDOMINAL:  Normoactive bowel sounds, soft, nontender, and nondistended.  No guarding or rebound.  No bruits.  EXTREMITIES:  No clubbing, cyanosis, or edema.  She does have a left  upper arm AV fistula with palpable thrill and audible bruit.  NEUROLOGIC:  She is awake, alert, and oriented x3.  Cranial nerves II  through XII are grossly intact.  No evidence of asterixis.   LABS FROM AN OUTSIDE HOSPITAL:  White blood cell count of 6.6,  hemoglobin 8.5, and platelets 204,000.  Sodium 135, potassium 4.9,  chloride 105, CO2 18, BUN 79, creatinine 5.17, glucose 153, and calcium  8.7.  Chest x-ray showed pulmonary edema.  Urinalysis showed 3+ protein,  2+ blood, 2+ leukocyte esterase, too numerous to count white cells, 4+  bacteria, and 15-20 red blood cells.   ASSESSMENT AND PLAN:  1. Progressive chronic kidney disease/uremia.  The patient is now end      stage given her metabolic acidosis and diuretic resistant      refractory volume overload.  The patient has had advanced EKG which      has worsened over the last several months and now has anorexia and      nausea and failure to thrive.  We will admit as an inpatient.  Plan      to initiate hemodialysis in the morning.  We will check a hepatitis      B surface antigen and antibody and will need tunnel catheter      placement as her fissure has not yet mature.  2. Congestive heart failure.  The patient has left-sided heart failure      with significant pulmonary edema and hemoptysis.  We will increase      her diuretics to 160 intravenous q.6 until she is able to initiate      hemodialysis.  We will plan for ultrafiltration of 3-4 L with      dialysis tomorrow and will do daily dialysis and slowly increase      time and follow.  We will also order a 2-D echo to evaluate her      heart failure and ejection fraction and try to obtain  her      outpatient medical records given her history of coronary disease      since she has been given nitroglycerin.  I not clear if she has had      a cardiac catheterization of her coronaries.  3. Hypertension.  Her blood pressure is at goal; however, we will hold      these medicines until she is started on dialysis and will follow      her blood pressure with ultrafiltration.  4. Anemia.  Her hemoglobin was less than 8 upon admission at [facility].      She was transfused 2 units of blood.  We will initiate Epogen      therapy with hemodialysis.  Check her iron studies and follow.  5. Urinary tract infection.  She was empirically placed on Rocephin.      We will follow her urine cultures.  6. Diabetes mellitus.  We will continue with Avandia and follow      sliding scale insulin with Accu-Cheks at bedtime and nightly.  7. Secondary hyperparathyroid.  We will check calcium, phosphorus, PTH      and we will likely need vitamin D and binders depending on those      labs.  8. Tobacco abuse.  Again stressing the importance of smoking      cessation, place a NicoDerm patch and follow.  9. Vascular access.  She has a left upper arm AV fistula with palpable      thrill and audible bruit, but not yet ready for use as it was only      placed in Jan 11, 2008.  We will continue to follow.   DISPOSITION:  The patient need educational programs on dialysis and will  likely be discharged to home in [facility] and start dialysis at  the [facility].           ______________________________  Terrial Rhodes, M.D.     JC/MEDQ  D:  02/07/2008  T:  02/08/2008  Job:  [id number]

## 2011-01-12 NOTE — Assessment & Plan Note (Signed)
OFFICE VISIT   [name], [name]  DOB:  Jan 31, 1941                                        December 10, 2009  CHART #:  [id number]   CURRENT PROBLEMS:  1. 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  2. 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  3. COPD with history of smoking.  4. Postoperative left pleural effusion.  5. Insulin-dependent diabetes mellitus.   HISTORY OF PRESENT ILLNESS:  The patient returns for Baker postop office  visit for wound check and chest x-ray.  She underwent her dialysis  therapy today.  She has had no postoperative angina or symptoms of CHF.  She has had Baker pulmonary congestion with Baker wet cough and some productive  sputum slightly discolored.  She is not smoking.  Her sternal and leg  incisions are healing well.   PHYSICAL EXAMINATION:  Vital Signs:  Blood pressure 120/60, pulse 66 and  sinus, saturation on room air 97%, temperature is 100.1.  Lungs:  She  has rhonchi at the left base.  Right lung is clear.  The sternal  incision is well healed.  Cardiac:  Rhythm is regular without gallop or  murmur or rub.  There is no pedal edema.   The patient appears to have an upper respiratory infection, bronchitis  with low-grade fever and Baker productive cough and increased markings at  the left base on chest x-ray.  We will place her on oral Avelox.   My exam, she does not appear to have Baker significant effusion and we will  treat her with Baker course of oral antibiotics before considering Baker  thoracentesis.  I will plan of seeing her back in 1 week with Baker chest x-  ray.   Kerin Perna, M.D.  Electronically Signed   PV/MEDQ  D:  12/10/2009  T:  12/11/2009  Job:  [id number]   cc:   [facility].

## 2011-03-10 IMAGING — CR DG CHEST 2V
2 series · 2 of 2 positions shown · non-contrast
Comparison: 10/12/2009

CLINICAL DATA: Week

CHEST - 2 VIEW

[w chest pa]
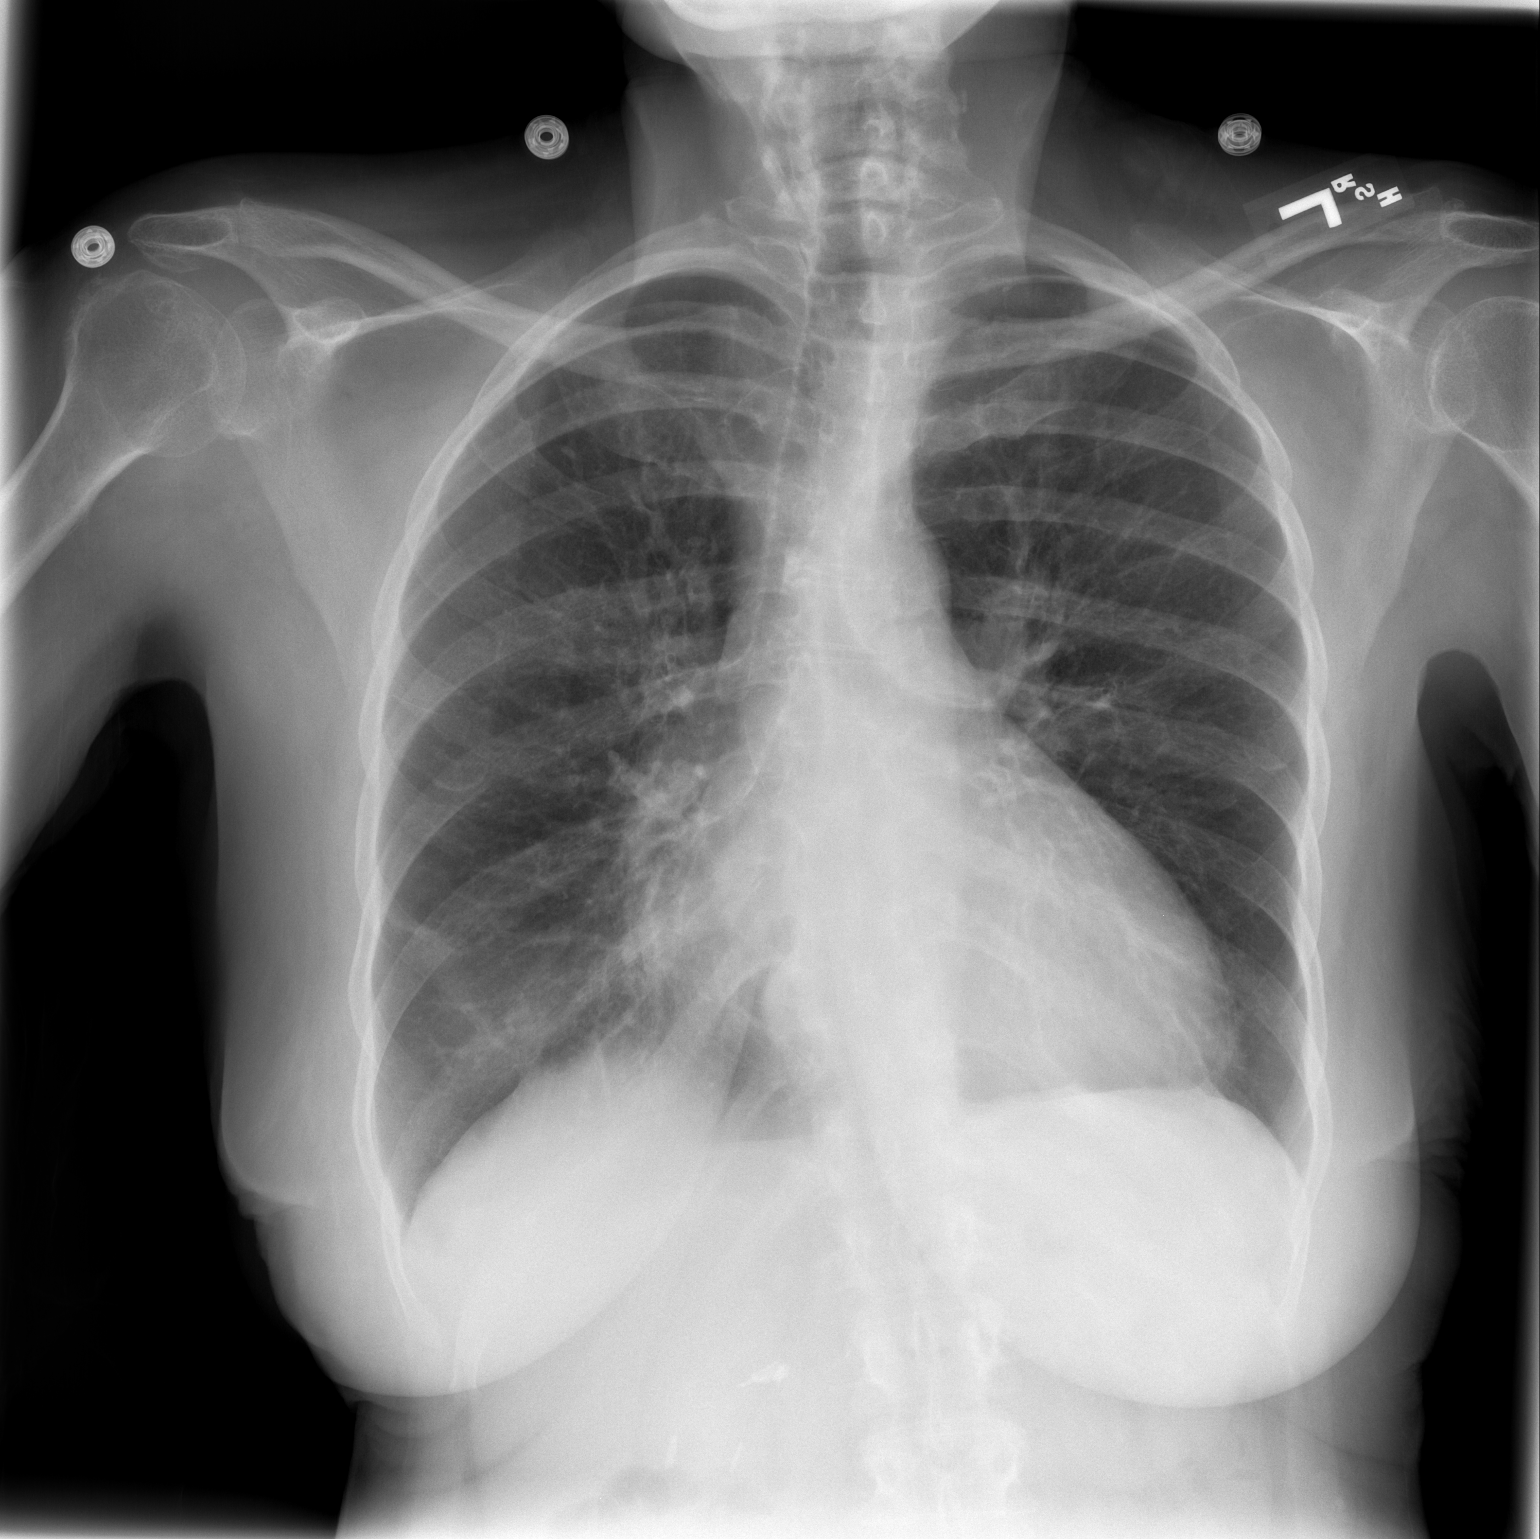

[w chest lat]
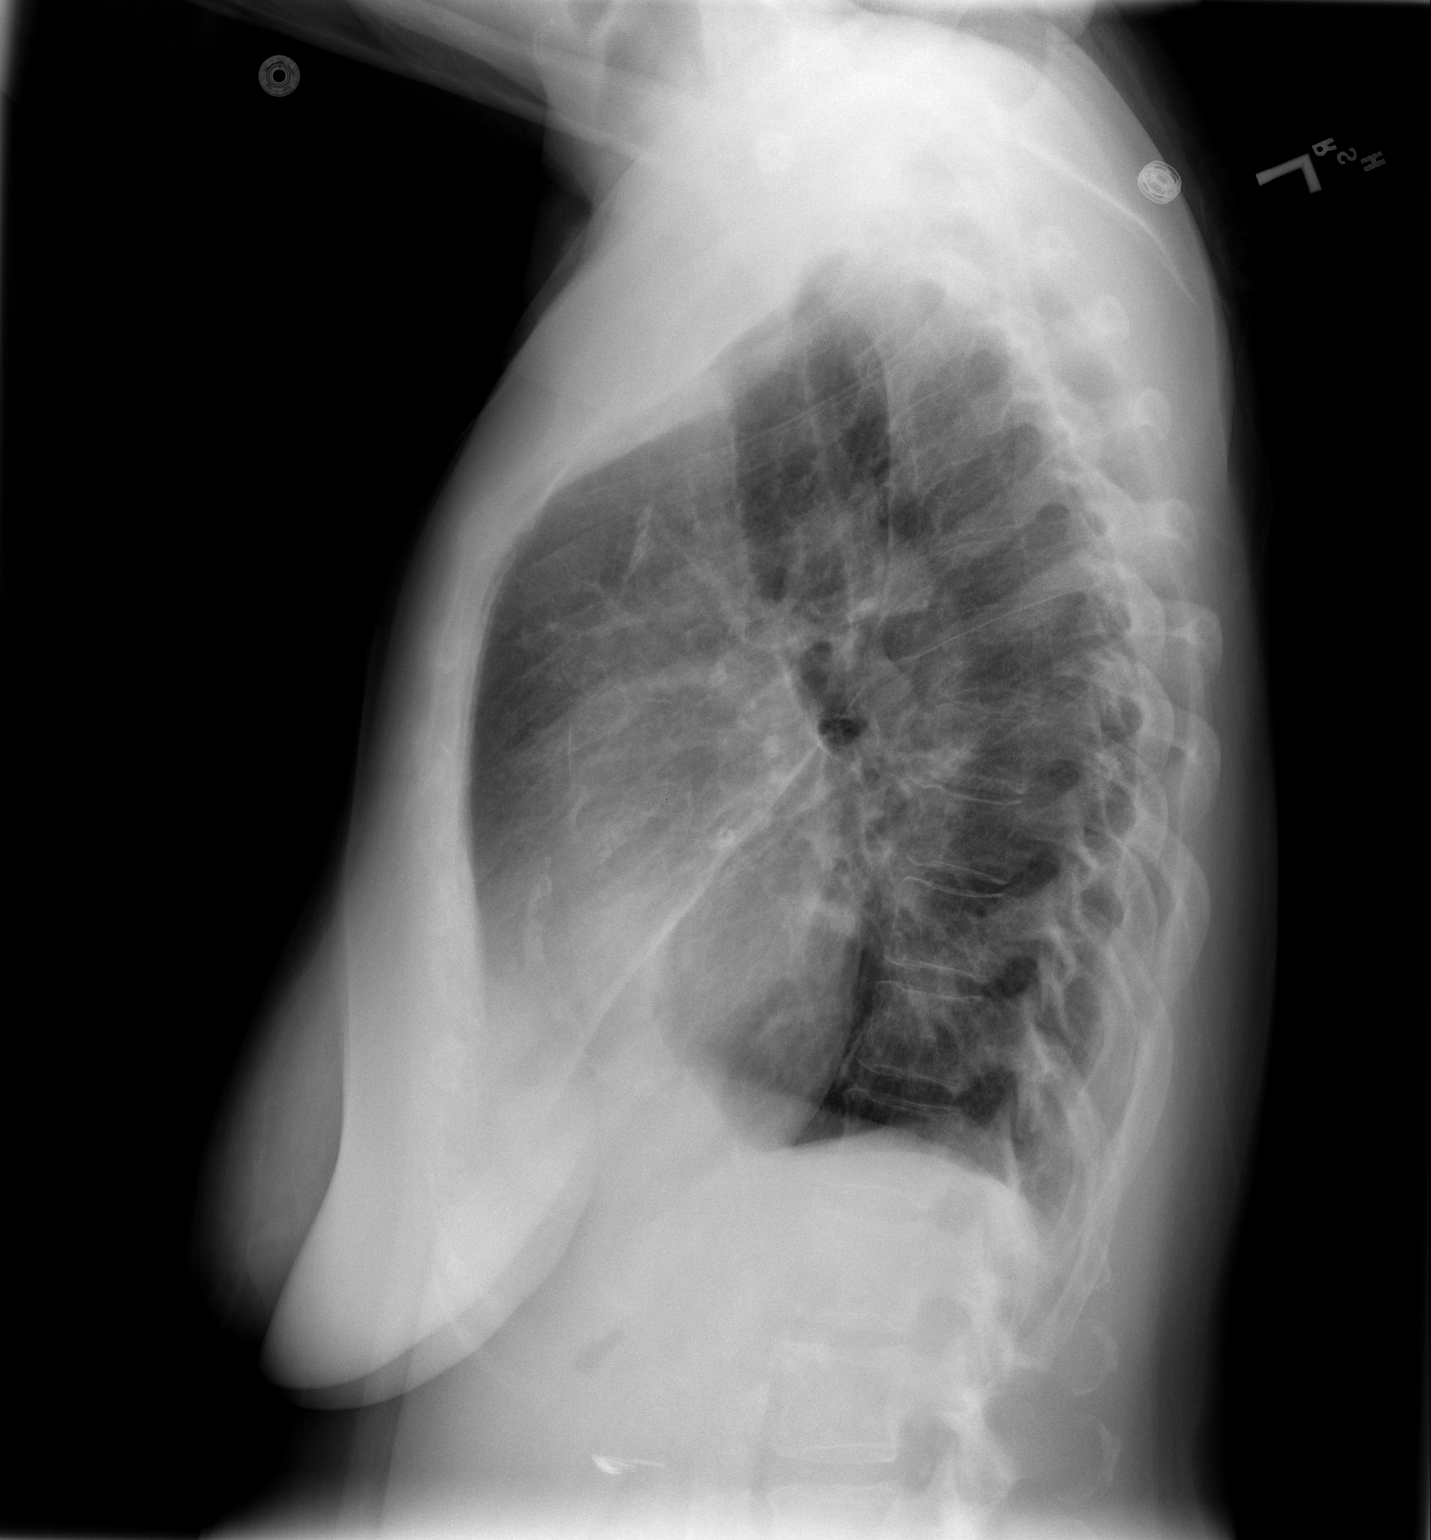

[2 of 2 positions shown; findings below may reference images not displayed]

FINDINGS: Lungs mildly hyperinflated with attenuated peripheral
bronchovascular markings.  Continued improvement in the right
perihilar infiltrate.  No new infiltrate or overt edema.  No
effusion.  Heart size upper limits normal.  Coronary calcifications
noted.  Regional bones unremarkable.
IMPRESSION: 1.  Continued improvement in right perihilar infiltrate.

## 2011-05-26 LAB — POCT I-STAT 4, (NA,K, GLUC, HGB,HCT)
Hemoglobin: 10.5 — ABNORMAL LOW
Operator id: 181601
Potassium: 4.6
Sodium: 134 — ABNORMAL LOW

## 2011-05-27 LAB — CROSSMATCH
ABO/RH(D): O POS
Antibody Screen: NEGATIVE

## 2011-05-27 LAB — CBC
HCT: 25.8 — ABNORMAL LOW
Hemoglobin: 10.7 — ABNORMAL LOW
Hemoglobin: 8.6 — ABNORMAL LOW
MCHC: 33.2
MCHC: 33.5
MCHC: 33.8
MCHC: 33.9
MCV: 88.3
Platelets: 218
Platelets: 248
RBC: 2.71 — ABNORMAL LOW
RBC: 2.84 — ABNORMAL LOW
RBC: 2.93 — ABNORMAL LOW
RDW: 16.7 — ABNORMAL HIGH
RDW: 17.5 — ABNORMAL HIGH
RDW: 17.8 — ABNORMAL HIGH
RDW: 17.2 — ABNORMAL HIGH
WBC: 9.9

## 2011-05-27 LAB — COMPREHENSIVE METABOLIC PANEL
ALT: 45 — ABNORMAL HIGH
Alkaline Phosphatase: 50
BUN: 91 — ABNORMAL HIGH
CO2: 16 — ABNORMAL LOW
Chloride: 105
GFR calc non Af Amer: 7 — ABNORMAL LOW
Glucose, Bld: 74
Potassium: 4.4
Sodium: 134 — ABNORMAL LOW
Total Bilirubin: 0.6
Total Protein: 6.1

## 2011-05-27 LAB — CK TOTAL AND CKMB (NOT AT ARMC)
CK, MB: 3.6
CK, MB: 3.7
Relative Index: 2.2

## 2011-05-27 LAB — RENAL FUNCTION PANEL
Albumin: 3 — ABNORMAL LOW
Albumin: 3 — ABNORMAL LOW
Albumin: 3 — ABNORMAL LOW
Albumin: 2.7 — ABNORMAL LOW
BUN: 60 — ABNORMAL HIGH
CO2: 25
Calcium: 8.5
Calcium: 8.5
Chloride: 100
Creatinine, Ser: 3.94 — ABNORMAL HIGH
GFR calc Af Amer: 15 — ABNORMAL LOW
GFR calc Af Amer: 9 — ABNORMAL LOW
GFR calc non Af Amer: 12 — ABNORMAL LOW
Phosphorus: 3.5
Phosphorus: 6.1 — ABNORMAL HIGH
Phosphorus: 5.2 — ABNORMAL HIGH
Potassium: 4.2
Potassium: 4.4
Sodium: 134 — ABNORMAL LOW
Sodium: 138
Sodium: 130 — ABNORMAL LOW

## 2011-05-27 LAB — FERRITIN: Ferritin: 513 — ABNORMAL HIGH (ref 10–291)

## 2011-05-27 LAB — HEMOGLOBIN AND HEMATOCRIT, BLOOD
HCT: 23.8 — ABNORMAL LOW
Hemoglobin: 8.1 — ABNORMAL LOW

## 2011-05-27 LAB — ABO/RH: ABO/RH(D): O POS

## 2011-05-27 LAB — PROTIME-INR
INR: 1.1
Prothrombin Time: 14.7

## 2011-05-27 LAB — IRON AND TIBC
Iron: 41 — ABNORMAL LOW
TIBC: 236 — ABNORMAL LOW

## 2011-05-27 LAB — OCCULT BLOOD X 1 CARD TO LAB, STOOL: Fecal Occult Bld: NEGATIVE

## 2011-05-27 LAB — FOLATE RBC: RBC Folate: 2018 — ABNORMAL HIGH

## 2011-07-02 ENCOUNTER — Other Ambulatory Visit: Payer: Self-pay | Admitting: Family Medicine

## 2012-01-11 ENCOUNTER — Encounter: Payer: Self-pay | Admitting: Vascular Surgery

## 2012-01-11 ENCOUNTER — Ambulatory Visit (INDEPENDENT_AMBULATORY_CARE_PROVIDER_SITE_OTHER): Payer: Medicare Other | Admitting: Vascular Surgery

## 2012-01-11 VITALS — BP 173/73 | HR 60 | Resp 16 | Ht 63.0 in | Wt 120.5 lb

## 2012-01-11 DIAGNOSIS — N186 End stage renal disease: Secondary | ICD-10-CM

## 2012-01-11 DIAGNOSIS — Z992 Dependence on renal dialysis: Secondary | ICD-10-CM | POA: Insufficient documentation

## 2012-01-11 NOTE — Progress Notes (Signed)
The patient presents today for evaluation of left upper arm AV fistula. Tomorrow will be the 4 year anniversary of placement of this left upper arm AV fistula by Dr. Myra Gianotti. She's had outstanding use of this since that time. Over the last several weeks she reports there've been an area of erythema on the mid upper arm the more proximal access site on the fistula. She reports that she is receiving 3 weeks of antibiotic treatment while on hemodialysis. She reports that there was some issues with bleeding at this site as well after access. She has not had any fever has not been septic from this.  Past Medical History  Diagnosis Date  . CHF (congestive heart failure)   . Diabetes mellitus   . Chronic kidney disease   . Hyperlipidemia   . Anemia   . Hypertension   . COPD (chronic obstructive pulmonary disease)   . Peripheral vascular disease   . Coronary artery disease     History  Substance Use Topics  . Smoking status: Current Some Day Smoker  . Smokeless tobacco: Not on file  . Alcohol Use: No    History reviewed. No pertinent family history.  Allergies  Allergen Reactions  . Codeine   . Sulfa Antibiotics     Current outpatient prescriptions:b complex-vitamin c-folic acid (NEPHRO-VITE) 0.8 MG TABS, Take 0.8 mg by mouth daily., Disp: , Rfl: ;  docusate sodium (COLACE) 100 MG capsule, Take 100 mg by mouth as needed., Disp: , Rfl: ;  fosinopril (MONOPRIL) 10 MG tablet, Take 10 mg by mouth daily., Disp: , Rfl: ;  insulin NPH-insulin regular (NOVOLIN 70/30) (70-30) 100 UNIT/ML injection, Inject into the skin. As directed., Disp: , Rfl:  loperamide (IMODIUM A-D) 2 MG tablet, Take 2 mg by mouth 2 (two) times daily as needed., Disp: , Rfl: ;  metoprolol succinate (TOPROL-XL) 50 MG 24 hr tablet, Take 50 mg by mouth 2 (two) times daily. Take with or immediately following a meal., Disp: , Rfl:   BP 173/73  Pulse 60  Resp 16  Ht 5\' 3"  (1.6 m)  Wt 120 lb 8 oz (54.658 kg)  BMI 21.35  kg/m2  Body mass index is 21.35 kg/(m^2).       Physical exam: Well-developed well-nourished black female in no acute distress HEENT normal Pulse status 2+ radial pulses bilaterally Her left upper arm AV fistula has some venous aneurysmal change just above the antecubital space with no skin breakdown at this area. The area of the more proximal needle sites has an area with a slight eschar. This does not extend down into the vein and there is no erythema or induration currently.  Impression and plan: Excellent long-term use of left upper arm AV fistula. By history there was some erythema associated with this over the last several weeks. She currently does not have any evidence of skin breakdown or risk for erosion or rupture of her fistula. I have recommended a rotator away from the site until total healing at the access center. I explained that with no prosthetic material beneath this and I would feel comfortable stopping her antibiotics and she does not currently have any evidence of infection or erythema. She her son present were pleased with this discussion and will see [place] again on an as-needed basis

## 2012-11-10 ENCOUNTER — Other Ambulatory Visit (HOSPITAL_COMMUNITY): Payer: Self-pay | Admitting: Cardiovascular Disease

## 2012-11-10 DIAGNOSIS — I70219 Atherosclerosis of native arteries of extremities with intermittent claudication, unspecified extremity: Secondary | ICD-10-CM

## 2012-11-10 DIAGNOSIS — I350 Nonrheumatic aortic (valve) stenosis: Secondary | ICD-10-CM

## 2012-11-10 DIAGNOSIS — R0989 Other specified symptoms and signs involving the circulatory and respiratory systems: Secondary | ICD-10-CM

## 2012-11-28 ENCOUNTER — Encounter (HOSPITAL_COMMUNITY): Payer: Medicare Other

## 2012-11-28 ENCOUNTER — Inpatient Hospital Stay (HOSPITAL_COMMUNITY): Admission: RE | Admit: 2012-11-28 | Payer: Medicare Other | Source: Ambulatory Visit

## 2013-02-01 ENCOUNTER — Ambulatory Visit (HOSPITAL_COMMUNITY)
Admission: RE | Admit: 2013-02-01 | Discharge: 2013-02-01 | Disposition: A | Discharge status: Home / Self Care | Payer: Medicare Other | Source: Ambulatory Visit | Attending: Internal Medicine | Admitting: Internal Medicine

## 2013-02-01 ENCOUNTER — Ambulatory Visit (HOSPITAL_COMMUNITY): Payer: Medicare Other

## 2013-02-01 ENCOUNTER — Encounter (HOSPITAL_COMMUNITY): Payer: Medicare Other

## 2013-02-01 ENCOUNTER — Other Ambulatory Visit (HOSPITAL_COMMUNITY): Payer: Self-pay | Admitting: Cardiovascular Disease

## 2013-02-01 ENCOUNTER — Ambulatory Visit (HOSPITAL_BASED_OUTPATIENT_CLINIC_OR_DEPARTMENT_OTHER)
Admission: RE | Admit: 2013-02-01 | Discharge: 2013-02-01 | Disposition: A | Discharge status: Home / Self Care | Payer: Medicare Other | Source: Ambulatory Visit | Attending: Internal Medicine | Admitting: Internal Medicine

## 2013-02-01 DIAGNOSIS — N186 End stage renal disease: Secondary | ICD-10-CM | POA: Insufficient documentation

## 2013-02-01 DIAGNOSIS — R0989 Other specified symptoms and signs involving the circulatory and respiratory systems: Secondary | ICD-10-CM

## 2013-02-01 DIAGNOSIS — I120 Hypertensive chronic kidney disease with stage 5 chronic kidney disease or end stage renal disease: Secondary | ICD-10-CM | POA: Insufficient documentation

## 2013-02-01 DIAGNOSIS — I379 Nonrheumatic pulmonary valve disorder, unspecified: Secondary | ICD-10-CM | POA: Insufficient documentation

## 2013-02-01 DIAGNOSIS — I70219 Atherosclerosis of native arteries of extremities with intermittent claudication, unspecified extremity: Secondary | ICD-10-CM

## 2013-02-01 DIAGNOSIS — I079 Rheumatic tricuspid valve disease, unspecified: Secondary | ICD-10-CM | POA: Insufficient documentation

## 2013-02-01 DIAGNOSIS — E785 Hyperlipidemia, unspecified: Secondary | ICD-10-CM | POA: Insufficient documentation

## 2013-02-01 DIAGNOSIS — I359 Nonrheumatic aortic valve disorder, unspecified: Secondary | ICD-10-CM

## 2013-02-01 DIAGNOSIS — I350 Nonrheumatic aortic (valve) stenosis: Secondary | ICD-10-CM

## 2013-02-01 DIAGNOSIS — Z992 Dependence on renal dialysis: Secondary | ICD-10-CM | POA: Insufficient documentation

## 2013-02-01 DIAGNOSIS — I080 Rheumatic disorders of both mitral and aortic valves: Secondary | ICD-10-CM | POA: Insufficient documentation

## 2013-02-01 DIAGNOSIS — E119 Type 2 diabetes mellitus without complications: Secondary | ICD-10-CM | POA: Insufficient documentation

## 2013-02-01 NOTE — Progress Notes (Signed)
Arterial Duplex Completed. Brianna L Mazza  

## 2013-02-01 NOTE — Progress Notes (Signed)
[facility]   2D echo completed 02/01/2013.   Cindy Derwin Reddy, RDCS  

## 2013-02-13 ENCOUNTER — Other Ambulatory Visit: Payer: Self-pay | Admitting: *Deleted

## 2013-02-13 ENCOUNTER — Encounter (INDEPENDENT_AMBULATORY_CARE_PROVIDER_SITE_OTHER): Payer: Medicare Other | Admitting: *Deleted

## 2013-02-13 ENCOUNTER — Encounter: Payer: Self-pay | Admitting: Vascular Surgery

## 2013-02-13 ENCOUNTER — Ambulatory Visit (INDEPENDENT_AMBULATORY_CARE_PROVIDER_SITE_OTHER): Payer: Medicare Other | Admitting: Vascular Surgery

## 2013-02-13 VITALS — BP 151/70 | HR 67 | Resp 16 | Ht 64.0 in | Wt 118.0 lb

## 2013-02-13 DIAGNOSIS — T82598A Other mechanical complication of other cardiac and vascular devices and implants, initial encounter: Secondary | ICD-10-CM

## 2013-02-13 DIAGNOSIS — N186 End stage renal disease: Secondary | ICD-10-CM

## 2013-02-13 NOTE — Progress Notes (Signed)
Subjective:     Patient ID: [name], female   DOB: 1941/06/17, 72 y.o.   MRN: [id number]  HPI this 72 year old female was referred because of an episode of bleeding following dialysis from the left upper arm AV fistula. The fistula was created and Dr. 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 Patient has never had spontaneous bleeding from the fistula. Last week after dialysis she was taking the tape dressing off later in the evening and after the dressing was removed it started bleeding from the puncture site. She does have 2 areas of aneurysmal dilatation in the fistula. She has no history of infection of the fistula.  Past Medical History  Diagnosis Date  . CHF (congestive heart failure)   . Diabetes mellitus   . Chronic kidney disease   . Hyperlipidemia   . Anemia   . Hypertension   . COPD (chronic obstructive pulmonary disease)   . Peripheral vascular disease   . Coronary artery disease     History  Substance Use Topics  . Smoking status: Current Some Day Smoker    Types: Cigarettes  . Smokeless tobacco: Never Used  . Alcohol Use: No    Family History  Problem Relation Age of Onset  . Heart disease Mother     Allergies  Allergen Reactions  . Codeine   . Sulfa Antibiotics     Current outpatient prescriptions:b complex-vitamin c-folic acid (NEPHRO-VITE) 0.8 MG TABS, Take 0.8 mg by mouth daily., Disp: , Rfl: ;  docusate sodium (COLACE) 100 MG capsule, Take 100 mg by mouth as needed., Disp: , Rfl: ;  fosinopril (MONOPRIL) 10 MG tablet, Take 10 mg by mouth daily., Disp: , Rfl: ;  insulin NPH-insulin regular (NOVOLIN 70/30) (70-30) 100 UNIT/ML injection, Inject into the skin. As directed., Disp: , Rfl:  loperamide (IMODIUM A-D) 2 MG tablet, Take 2 mg by mouth 2 (two) times daily as needed., Disp: , Rfl: ;  metoprolol succinate (TOPROL-XL) 50 MG 24 hr tablet, Take 50 mg by mouth 2 (two) times daily. Take with or immediately following a meal., Disp: , Rfl:   BP 151/70   Pulse 67  Resp 16  Ht 5\' 4"  (1.626 m)  Wt 118 lb (53.524 kg)  BMI 20.24 kg/m2  Body mass index is 20.24 kg/(m^2).           Review of Systems     Objective:   Physical Exam blood pressure 151/70 heart rate 67 respirations 16 Elderly female in no apparent distress alert and oriented x3 Lungs no rhonchi or wheezing Left upper extremity with brachial cephalic AV fistula with excellent pulse and palpable thrill. There is no evidence of infection of the fistula. There is one small eschar on the more proximal area of aneurysmal dilatation measuring 1-2 mm in size which is uninfected. This is not the site where the bleeding occurred according to the patient. There is no ulceration on the more distal area of aneurysmal dilatation. Radial pulses 2+ distally.  Today I ordered a venous duplex exam the left upper extremity. The fistula is somewhat tortuous but functioning nicely with excellent flow. The proximal area of dilatation is 1.4 cm in the distal area is 2.09 cm    Assessment:     End-stage renal disease with nicely functioning left upper arm AV fistula. Patient had episode of bleeding from puncture site last week following her removal of the dressing. No evidence of infection. Patient    Plan:  Patient is comfortable not revising or removing the fistula at this time and would prefer not to do that. She will wrapped the arm with an Ace wrap following dialysis and not remove the dressing until the following day. If she has any further bleeding sites from the puncture areas she will be in touch with [place]. Dialysis Center should rotate puncture sites. Return in 4 weeks for further examination of this

## 2013-02-21 ENCOUNTER — Telehealth: Payer: Self-pay | Admitting: *Deleted

## 2013-02-21 ENCOUNTER — Encounter: Payer: Self-pay | Admitting: *Deleted

## 2013-02-21 ENCOUNTER — Encounter: Payer: Self-pay | Admitting: Cardiovascular Disease

## 2013-02-21 NOTE — Telephone Encounter (Signed)
Message copied by Gaynelle Cage on Wed Feb 21, 2013 10:15 AM ------      Message from: Nicki Guadalajara A      Created: Mon Feb 19, 2013  8:37 PM       Nl fxn, severe AS;  f/u ov ------

## 2013-02-21 NOTE — Telephone Encounter (Signed)
Called patient to give her echo results got no answer or machine. Will try to contact again or send letter appointment needed.

## 2013-02-22 ENCOUNTER — Encounter: Payer: Self-pay | Admitting: Diagnostic Radiology

## 2013-02-23 ENCOUNTER — Telehealth: Payer: Self-pay | Admitting: Cardiovascular Disease

## 2013-02-28 ENCOUNTER — Telehealth: Payer: Self-pay | Admitting: Cardiovascular Disease

## 2013-02-28 NOTE — Telephone Encounter (Signed)
Called pt 2 times first one was on 6/27 to schedule appointment with Dr. Tresa Endo. Both times there was no answer.

## 2013-02-28 NOTE — Progress Notes (Signed)
Quick Note:  Note sent to [facility]. to schedule patient for a appointment to follow up with Dr. Tresa Endo to discuss echo results. ______

## 2013-03-12 ENCOUNTER — Encounter: Payer: Self-pay | Admitting: Vascular Surgery

## 2013-03-13 ENCOUNTER — Encounter: Payer: Self-pay | Admitting: Vascular Surgery

## 2013-03-13 ENCOUNTER — Ambulatory Visit (INDEPENDENT_AMBULATORY_CARE_PROVIDER_SITE_OTHER): Payer: Medicare Other | Admitting: Vascular Surgery

## 2013-03-13 VITALS — BP 155/52 | HR 52 | Ht 64.0 in | Wt 115.9 lb

## 2013-03-13 DIAGNOSIS — N186 End stage renal disease: Secondary | ICD-10-CM

## 2013-03-13 NOTE — Progress Notes (Signed)
Subjective:     Patient ID: [name], female   DOB: November 27, 1940, 72 y.o.   MRN: [id number]  HPI this 72 year old female returns for continued followup regarding her left upper arm AV fistula which had a bleeding episode 4-6 weeks ago. When examined 4 weeks ago she had one small eschar in an area which had not had any bleeding and a very tortuous well-functioning fistula. She has had no further bleeding episodes since the last visit. Fistula continues to function nicely. She denies pain or numbness in the left hand.  Past Medical History  Diagnosis Date  . CHF (congestive heart failure)   . Diabetes mellitus   . Chronic kidney disease   . Hyperlipidemia   . Anemia   . Hypertension   . COPD (chronic obstructive pulmonary disease)   . Peripheral vascular disease   . Coronary artery disease     History  Substance Use Topics  . Smoking status: Current Some Day Smoker -- 0.50 packs/day    Types: Cigarettes  . Smokeless tobacco: Never Used  . Alcohol Use: No    Family History  Problem Relation Age of Onset  . Heart disease Mother     Allergies  Allergen Reactions  . Codeine   . Sulfa Antibiotics     Current outpatient prescriptions:insulin NPH-insulin regular (NOVOLIN 70/30) (70-30) 100 UNIT/ML injection, Inject into the skin. As directed., Disp: , Rfl: ;  metoprolol succinate (TOPROL-XL) 50 MG 24 hr tablet, Take 50 mg by mouth 2 (two) times daily. Take with or immediately following a meal., Disp: , Rfl: ;  b complex-vitamin c-folic acid (NEPHRO-VITE) 0.8 MG TABS, Take 0.8 mg by mouth daily., Disp: , Rfl:  docusate sodium (COLACE) 100 MG capsule, Take 100 mg by mouth as needed., Disp: , Rfl: ;  fosinopril (MONOPRIL) 10 MG tablet, Take 10 mg by mouth daily., Disp: , Rfl: ;  loperamide (IMODIUM A-D) 2 MG tablet, Take 2 mg by mouth 2 (two) times daily as needed., Disp: , Rfl:   BP 155/52  Pulse 52  Ht 5\' 4"  (1.626 m)  Wt 115 lb 14.4 oz (52.572 kg)  BMI 19.88 kg/m2  SpO2  98%  Body mass index is 19.88 kg/(m^2).           Review of Systems denies chest pain, dyspnea on exertion, PND, orthopnea, claudication     Objective:   Physical Exam blood pressure 155/52 heart rate 60 respirations 18 General well-developed well-nourished female in no apparent distress alert and oriented x3 Left upper extremity with nicely functioning brachial-cephalic AV fistula. 2 areas of aneurysmal dilatation are noted approximately 2 centimeters in diameter. No ulceration noted. No infection noted. 1-2+ radial pulse distally with well-perfused left hand    Assessment:    nicely functioning left brachial to cephalic AV fistula with a few episodes of bleeding post puncture on dialysis but none within the past 4-6 weeks. No evidence of infection or ulceration. Patient would like to continue using his fistula.    Plan:     Return to see [place] on when necessary basis for poorly functioning fistula or frequent bleeding following dialysis

## 2013-03-15 ENCOUNTER — Telehealth: Payer: Self-pay | Admitting: *Deleted

## 2013-03-15 ENCOUNTER — Ambulatory Visit: Payer: Medicare Other | Admitting: Cardiovascular Disease

## 2013-03-15 NOTE — Telephone Encounter (Signed)
Patient was scheduled to see Dr. Tresa Endo today. He was called to hospital for STEMI. Patient was informed of this and offered for her  to see Wilburt Finlay PA-C. She agreed. Went out to call patient back for workup, and was told that she changed her mild and left.

## 2013-03-25 ENCOUNTER — Encounter: Payer: Self-pay | Admitting: *Deleted

## 2013-03-27 ENCOUNTER — Ambulatory Visit: Payer: Medicare Other | Admitting: Cardiovascular Disease

## 2013-04-10 ENCOUNTER — Encounter: Payer: Self-pay | Admitting: Cardiovascular Disease

## 2013-04-10 ENCOUNTER — Ambulatory Visit: Payer: Medicare Other | Admitting: Cardiovascular Disease

## 2013-06-07 ENCOUNTER — Ambulatory Visit: Payer: Medicare Other | Admitting: Cardiovascular Disease

## 2013-06-19 ENCOUNTER — Ambulatory Visit
Admission: RE | Admit: 2013-06-19 | Discharge: 2013-06-19 | Disposition: A | Discharge status: Home / Self Care | Payer: Medicare Other | Source: Ambulatory Visit | Attending: Cardiovascular Disease | Admitting: Cardiovascular Disease

## 2013-06-19 ENCOUNTER — Encounter: Payer: Self-pay | Admitting: Cardiovascular Disease

## 2013-06-19 ENCOUNTER — Ambulatory Visit (INDEPENDENT_AMBULATORY_CARE_PROVIDER_SITE_OTHER): Payer: Medicare Other | Admitting: Cardiovascular Disease

## 2013-06-19 VITALS — BP 150/78 | HR 64 | Ht 64.0 in | Wt 121.3 lb

## 2013-06-19 DIAGNOSIS — N186 End stage renal disease: Secondary | ICD-10-CM

## 2013-06-19 DIAGNOSIS — E8770 Fluid overload, unspecified: Secondary | ICD-10-CM

## 2013-06-19 DIAGNOSIS — D689 Coagulation defect, unspecified: Secondary | ICD-10-CM

## 2013-06-19 DIAGNOSIS — I2510 Atherosclerotic heart disease of native coronary artery without angina pectoris: Secondary | ICD-10-CM

## 2013-06-19 DIAGNOSIS — Z01818 Encounter for other preprocedural examination: Secondary | ICD-10-CM

## 2013-06-19 DIAGNOSIS — I350 Nonrheumatic aortic (valve) stenosis: Secondary | ICD-10-CM

## 2013-06-19 DIAGNOSIS — I359 Nonrheumatic aortic valve disorder, unspecified: Secondary | ICD-10-CM

## 2013-06-19 DIAGNOSIS — I214 Non-ST elevation (NSTEMI) myocardial infarction: Secondary | ICD-10-CM

## 2013-06-19 DIAGNOSIS — E119 Type 2 diabetes mellitus without complications: Secondary | ICD-10-CM

## 2013-06-19 DIAGNOSIS — I480 Paroxysmal atrial fibrillation: Secondary | ICD-10-CM

## 2013-06-19 DIAGNOSIS — E785 Hyperlipidemia, unspecified: Secondary | ICD-10-CM

## 2013-06-19 DIAGNOSIS — I4891 Unspecified atrial fibrillation: Secondary | ICD-10-CM

## 2013-06-19 DIAGNOSIS — I10 Essential (primary) hypertension: Secondary | ICD-10-CM

## 2013-06-19 DIAGNOSIS — J449 Chronic obstructive pulmonary disease, unspecified: Secondary | ICD-10-CM

## 2013-06-19 DIAGNOSIS — E8779 Other fluid overload: Secondary | ICD-10-CM

## 2013-06-19 LAB — CBC
Hemoglobin: 12.4 g/dL (ref 12.0–15.0)
MCH: 29 pg (ref 26.0–34.0)
MCHC: 31.2 g/dL (ref 30.0–36.0)
MCV: 92.8 fL (ref 78.0–100.0)
RBC: 4.28 MIL/uL (ref 3.87–5.11)

## 2013-06-19 LAB — PROTIME-INR: Prothrombin Time: 14.4 seconds (ref 11.6–15.2)

## 2013-06-19 MED ORDER — METOPROLOL SUCCINATE ER 50 MG PO TB24: 50.0000 mg | ORAL_TABLET | Freq: Every day | ORAL | Status: DC

## 2013-06-19 NOTE — Patient Instructions (Addendum)
Your physician has requested that you have a cardiac catheterization. Cardiac catheterization is used to diagnose and/or treat various heart conditions. Doctors may recommend this procedure for a number of different reasons. The most common reason is to evaluate chest pain. Chest pain can be a symptom of coronary artery disease (CAD), and cardiac catheterization can show whether plaque is narrowing or blocking your heart's arteries. This procedure is also used to evaluate the valves, as well as measure the blood flow and oxygen levels in different parts of your heart. For further information please visit [email or web address] Please follow instruction sheet, as given. This will be done on a Tuesday or a Thursday.   Your physician recommends that you return for lab work and chest xray within 7 days of the procedure.    Your  physician recommends that you schedule a follow-up appointment : this will be determined after procedure.

## 2013-06-20 LAB — COMPREHENSIVE METABOLIC PANEL
AST: 18 U/L (ref 0–37)
Albumin: 4 g/dL (ref 3.5–5.2)
Alkaline Phosphatase: 135 U/L — ABNORMAL HIGH (ref 39–117)
BUN: 24 mg/dL — ABNORMAL HIGH (ref 6–23)
Potassium: 4.4 mEq/L (ref 3.5–5.3)
Sodium: 140 mEq/L (ref 135–145)
Total Bilirubin: 0.4 mg/dL (ref 0.3–1.2)
Total Protein: 6.5 g/dL (ref 6.0–8.3)

## 2013-06-21 ENCOUNTER — Encounter: Payer: Self-pay | Admitting: Cardiovascular Disease

## 2013-06-22 ENCOUNTER — Other Ambulatory Visit: Payer: Self-pay | Admitting: *Deleted

## 2013-06-22 DIAGNOSIS — D689 Coagulation defect, unspecified: Secondary | ICD-10-CM

## 2013-06-22 DIAGNOSIS — Z01818 Encounter for other preprocedural examination: Secondary | ICD-10-CM

## 2013-06-29 NOTE — Progress Notes (Signed)
This is pre -cath CXR. Will be discussed at hospital.

## 2013-07-04 ENCOUNTER — Inpatient Hospital Stay (HOSPITAL_COMMUNITY)
Admission: EM | Admit: 2013-07-04 | Discharge: 2013-07-13 | DRG: 246 | Disposition: A | Discharge status: Home / Self Care | Payer: Medicare Other | Source: Other Acute Inpatient Hospital | Attending: Cardiovascular Disease | Admitting: Cardiovascular Disease

## 2013-07-04 ENCOUNTER — Encounter (HOSPITAL_COMMUNITY): Payer: Self-pay | Admitting: *Deleted

## 2013-07-04 ENCOUNTER — Encounter: Payer: Self-pay | Admitting: Cardiovascular Disease

## 2013-07-04 DIAGNOSIS — I2789 Other specified pulmonary heart diseases: Secondary | ICD-10-CM | POA: Diagnosis present

## 2013-07-04 DIAGNOSIS — J449 Chronic obstructive pulmonary disease, unspecified: Secondary | ICD-10-CM | POA: Diagnosis present

## 2013-07-04 DIAGNOSIS — I214 Non-ST elevation (NSTEMI) myocardial infarction: Principal | ICD-10-CM | POA: Diagnosis present

## 2013-07-04 DIAGNOSIS — I252 Old myocardial infarction: Secondary | ICD-10-CM

## 2013-07-04 DIAGNOSIS — E8770 Fluid overload, unspecified: Secondary | ICD-10-CM

## 2013-07-04 DIAGNOSIS — I350 Nonrheumatic aortic (valve) stenosis: Secondary | ICD-10-CM

## 2013-07-04 DIAGNOSIS — Z888 Allergy status to other drugs, medicaments and biological substances status: Secondary | ICD-10-CM

## 2013-07-04 DIAGNOSIS — Z79899 Other long term (current) drug therapy: Secondary | ICD-10-CM

## 2013-07-04 DIAGNOSIS — E785 Hyperlipidemia, unspecified: Secondary | ICD-10-CM | POA: Diagnosis present

## 2013-07-04 DIAGNOSIS — I739 Peripheral vascular disease, unspecified: Secondary | ICD-10-CM | POA: Diagnosis present

## 2013-07-04 DIAGNOSIS — Z8249 Family history of ischemic heart disease and other diseases of the circulatory system: Secondary | ICD-10-CM

## 2013-07-04 DIAGNOSIS — I2510 Atherosclerotic heart disease of native coronary artery without angina pectoris: Secondary | ICD-10-CM | POA: Diagnosis present

## 2013-07-04 DIAGNOSIS — I25810 Atherosclerosis of coronary artery bypass graft(s) without angina pectoris: Secondary | ICD-10-CM | POA: Diagnosis present

## 2013-07-04 DIAGNOSIS — Z882 Allergy status to sulfonamides status: Secondary | ICD-10-CM

## 2013-07-04 DIAGNOSIS — I359 Nonrheumatic aortic valve disorder, unspecified: Secondary | ICD-10-CM

## 2013-07-04 DIAGNOSIS — I10 Essential (primary) hypertension: Secondary | ICD-10-CM | POA: Diagnosis present

## 2013-07-04 DIAGNOSIS — D631 Anemia in chronic kidney disease: Secondary | ICD-10-CM | POA: Diagnosis present

## 2013-07-04 DIAGNOSIS — Z7902 Long term (current) use of antithrombotics/antiplatelets: Secondary | ICD-10-CM

## 2013-07-04 DIAGNOSIS — J4489 Other specified chronic obstructive pulmonary disease: Secondary | ICD-10-CM | POA: Diagnosis present

## 2013-07-04 DIAGNOSIS — I428 Other cardiomyopathies: Secondary | ICD-10-CM | POA: Diagnosis present

## 2013-07-04 DIAGNOSIS — I4891 Unspecified atrial fibrillation: Secondary | ICD-10-CM | POA: Diagnosis present

## 2013-07-04 DIAGNOSIS — I079 Rheumatic tricuspid valve disease, unspecified: Secondary | ICD-10-CM | POA: Diagnosis present

## 2013-07-04 DIAGNOSIS — I120 Hypertensive chronic kidney disease with stage 5 chronic kidney disease or end stage renal disease: Secondary | ICD-10-CM | POA: Diagnosis present

## 2013-07-04 DIAGNOSIS — I498 Other specified cardiac arrhythmias: Secondary | ICD-10-CM | POA: Diagnosis not present

## 2013-07-04 DIAGNOSIS — I509 Heart failure, unspecified: Secondary | ICD-10-CM | POA: Diagnosis present

## 2013-07-04 DIAGNOSIS — I480 Paroxysmal atrial fibrillation: Secondary | ICD-10-CM | POA: Diagnosis present

## 2013-07-04 DIAGNOSIS — N186 End stage renal disease: Secondary | ICD-10-CM | POA: Diagnosis present

## 2013-07-04 DIAGNOSIS — Z794 Long term (current) use of insulin: Secondary | ICD-10-CM

## 2013-07-04 DIAGNOSIS — I080 Rheumatic disorders of both mitral and aortic valves: Secondary | ICD-10-CM | POA: Diagnosis present

## 2013-07-04 DIAGNOSIS — F17200 Nicotine dependence, unspecified, uncomplicated: Secondary | ICD-10-CM | POA: Diagnosis present

## 2013-07-04 DIAGNOSIS — Z7982 Long term (current) use of aspirin: Secondary | ICD-10-CM

## 2013-07-04 DIAGNOSIS — N2581 Secondary hyperparathyroidism of renal origin: Secondary | ICD-10-CM | POA: Diagnosis present

## 2013-07-04 DIAGNOSIS — E876 Hypokalemia: Secondary | ICD-10-CM | POA: Diagnosis not present

## 2013-07-04 DIAGNOSIS — E119 Type 2 diabetes mellitus without complications: Secondary | ICD-10-CM | POA: Diagnosis present

## 2013-07-04 DIAGNOSIS — E1129 Type 2 diabetes mellitus with other diabetic kidney complication: Secondary | ICD-10-CM | POA: Diagnosis present

## 2013-07-04 DIAGNOSIS — Z992 Dependence on renal dialysis: Secondary | ICD-10-CM

## 2013-07-04 DIAGNOSIS — I2582 Chronic total occlusion of coronary artery: Secondary | ICD-10-CM | POA: Diagnosis present

## 2013-07-04 LAB — CBC WITH DIFFERENTIAL/PLATELET
Basophils Absolute: 0.1 10*3/uL (ref 0.0–0.1)
Eosinophils Absolute: 0.2 10*3/uL (ref 0.0–0.7)
Eosinophils Relative: 4 % (ref 0–5)
HCT: 34.2 % — ABNORMAL LOW (ref 36.0–46.0)
Lymphocytes Relative: 24 % (ref 12–46)
MCH: 29.9 pg (ref 26.0–34.0)
MCHC: 32.7 g/dL (ref 30.0–36.0)
MCV: 91.2 fL (ref 78.0–100.0)
Monocytes Absolute: 0.7 10*3/uL (ref 0.1–1.0)
Neutrophils Relative %: 59 % (ref 43–77)
Platelets: 139 10*3/uL — ABNORMAL LOW (ref 150–400)
RDW: 16.7 % — ABNORMAL HIGH (ref 11.5–15.5)

## 2013-07-04 LAB — COMPREHENSIVE METABOLIC PANEL
ALT: 34 U/L (ref 0–35)
AST: 41 U/L — ABNORMAL HIGH (ref 0–37)
Albumin: 3.5 g/dL (ref 3.5–5.2)
Alkaline Phosphatase: 183 U/L — ABNORMAL HIGH (ref 39–117)
CO2: 29 mEq/L (ref 19–32)
Chloride: 94 mEq/L — ABNORMAL LOW (ref 96–112)
Creatinine, Ser: 3.25 mg/dL — ABNORMAL HIGH (ref 0.50–1.10)
GFR calc non Af Amer: 13 mL/min — ABNORMAL LOW (ref >90)
Glucose, Bld: 262 mg/dL — ABNORMAL HIGH (ref 70–99)
Potassium: 3.2 mEq/L — ABNORMAL LOW (ref 3.5–5.1)
Sodium: 137 mEq/L (ref 135–145)
Total Bilirubin: 0.2 mg/dL — ABNORMAL LOW (ref 0.3–1.2)

## 2013-07-04 LAB — MRSA PCR SCREENING: MRSA by PCR: NEGATIVE

## 2013-07-04 LAB — HEMOGLOBIN A1C: Mean Plasma Glucose: 197 mg/dL — ABNORMAL HIGH (ref <117)

## 2013-07-04 LAB — TROPONIN I: Troponin I: 4.34 ng/mL (ref <0.30)

## 2013-07-04 LAB — GLUCOSE, CAPILLARY
Glucose-Capillary: 233 mg/dL — ABNORMAL HIGH (ref 70–99)
Glucose-Capillary: 206 mg/dL — ABNORMAL HIGH (ref 70–99)
Glucose-Capillary: 127 mg/dL — ABNORMAL HIGH (ref 70–99)

## 2013-07-04 MED ORDER — ONDANSETRON HCL 4 MG/2ML IJ SOLN
4.0000 mg | Freq: Four times a day (QID) | INTRAMUSCULAR | Status: DC | PRN
  Administered 2013-07-09: 4 mg via INTRAVENOUS
  Filled 2013-07-04: qty 2

## 2013-07-04 MED ORDER — ATORVASTATIN CALCIUM 40 MG PO TABS
40.0000 mg | ORAL_TABLET | Freq: Every day | ORAL | Status: DC
  Administered 2013-07-04 – 2013-07-10 (×7): 40 mg via ORAL
  Filled 2013-07-04 (×9): qty 1

## 2013-07-04 MED ORDER — ASPIRIN EC 81 MG PO TBEC
81.0000 mg | DELAYED_RELEASE_TABLET | Freq: Every day | ORAL | Status: DC
  Administered 2013-07-05: 81 mg via ORAL
  Filled 2013-07-04 (×2): qty 1

## 2013-07-04 MED ORDER — HEPARIN (PORCINE) IN NACL 100-0.45 UNIT/ML-% IJ SOLN
800.0000 [IU]/h | INTRAMUSCULAR | Status: DC
  Administered 2013-07-04: 650 [IU]/h via INTRAVENOUS
  Administered 2013-07-05: 800 [IU]/h via INTRAVENOUS
  Filled 2013-07-04 (×5): qty 250

## 2013-07-04 MED ORDER — METOPROLOL TARTRATE 25 MG PO TABS
25.0000 mg | ORAL_TABLET | Freq: Two times a day (BID) | ORAL | Status: DC
  Administered 2013-07-04 – 2013-07-05 (×3): 25 mg via ORAL
  Filled 2013-07-04 (×7): qty 1

## 2013-07-04 MED ORDER — INSULIN ASPART 100 UNIT/ML ~~LOC~~ SOLN
0.0000 [IU] | Freq: Three times a day (TID) | SUBCUTANEOUS | Status: DC
  Administered 2013-07-04: 3 [IU] via SUBCUTANEOUS
  Administered 2013-07-05: 5 [IU] via SUBCUTANEOUS
  Administered 2013-07-05: 1 [IU] via SUBCUTANEOUS
  Administered 2013-07-05: 2 [IU] via SUBCUTANEOUS
  Administered 2013-07-07: 3 [IU] via SUBCUTANEOUS
  Administered 2013-07-08: 1 [IU] via SUBCUTANEOUS
  Administered 2013-07-10: 3 [IU] via SUBCUTANEOUS
  Administered 2013-07-11: 1 [IU] via SUBCUTANEOUS
  Administered 2013-07-11: 2 [IU] via SUBCUTANEOUS

## 2013-07-04 MED ORDER — DOXERCALCIFEROL 4 MCG/2ML IV SOLN
2.0000 ug | INTRAVENOUS | Status: DC
  Administered 2013-07-06 – 2013-07-12 (×3): 2 ug via INTRAVENOUS
  Filled 2013-07-04 (×3): qty 2

## 2013-07-04 MED ORDER — CALCIUM ACETATE 667 MG PO CAPS
667.0000 mg | ORAL_CAPSULE | Freq: Three times a day (TID) | ORAL | Status: DC
  Administered 2013-07-04 – 2013-07-06 (×5): 667 mg via ORAL
  Filled 2013-07-04 (×8): qty 1

## 2013-07-04 MED ORDER — NITROGLYCERIN 0.4 MG SL SUBL: 0.4000 mg | SUBLINGUAL_TABLET | SUBLINGUAL | Status: DC | PRN

## 2013-07-04 MED ORDER — ACETAMINOPHEN 325 MG PO TABS
650.0000 mg | ORAL_TABLET | ORAL | Status: DC | PRN
  Administered 2013-07-04 – 2013-07-09 (×5): 650 mg via ORAL
  Filled 2013-07-04 (×5): qty 2

## 2013-07-04 MED ORDER — HEPARIN BOLUS VIA INFUSION
2500.0000 [IU] | Freq: Once | INTRAVENOUS | Status: AC
  Administered 2013-07-04: 2500 [IU] via INTRAVENOUS
  Filled 2013-07-04: qty 2500

## 2013-07-04 MED ORDER — RENA-VITE PO TABS
1.0000 | ORAL_TABLET | Freq: Every day | ORAL | Status: DC
  Administered 2013-07-04 – 2013-07-10 (×7): 1 via ORAL
  Filled 2013-07-04 (×9): qty 1

## 2013-07-04 NOTE — H&P (Signed)
[name] is an 72 y.o. female.    Chief Complaint:   HPI:   She is a very pleasant 72 year old African American female who has end-stage renal disease for which she sees Dr. Caryn Section and undergoes hemodialysis on Monday, Wednesday and Fridays in [place]. In February of 2011 she suffered a non-STEMI. She was found to have mild aortic stenosis at catheterization with pulmonary hypertension with a PA pressure of 45mm and a mean aortic gradient of 10 mm. She underwent CABG surgery x3 by Dr. Donata Clay with a LIMA to the LAD, a vein to the circumflex and a vein to the PDA. Although her intraoperative TEE confirmed mild aortic stenosis it was not felt that this was severe enough to warrant replacement at that time.   Her history also includes DM, HLD, anemia, HTN, PVD.  Her last echo Doppler study June 2014 showed and EF of 55-60%, severe aortic stenosis with AVA of 0.4cm^2, Peak and mean gradients of 70 and 43 mmHg.  She also has moderate tricuspid regurgitation, mild to moderate mitral regurgitation, and severe LA dilatation.   The patient was at HD today when she developed CP and became diaphoretic after going into afib RVR with a HR in the 140's.  She was taken to [facility].  Initial troponin was 10.6.  She also reports SOB, chronic intermittent nausea, LEE, occasional cough with mucous.  .  The CP did not radiate. The patient currently denies vomiting, fever, dizziness, PND, abdominal pain, hematochezia, melena, claudication.  She lives with family and uses a walker or cane to get around.  No history of GIB but according to the patient, she did fall about a month ago after apparent syncopal episode.  She is currently pain free.      Past Medical History  Diagnosis Date  . CHF (congestive heart failure)   . Diabetes mellitus   . Chronic kidney disease   . Hyperlipidemia   . Anemia   . Hypertension   . COPD (chronic obstructive pulmonary disease)   . Peripheral vascular disease   .  Coronary artery disease   . Hx of echocardiogram 03/2011    showed a mean gradient that had increased to 41 and a peak gradient that had increase to 70, she has moderate tricuspid regurigitation, mild to moderate mitral regurigitation, and significant LA dilation.    Past Surgical History  Procedure Laterality Date  . Coronary artery bypass graft      x3 by Dr Alla German with LIMA to the LAD, a vein to the circumflex and a vien to the PDA.  Marland Kitchen Abdominal hysterectomy    . Thoracentesis  11-2009  . Av fistula placement  01-12-2008    left upper arm  . Cardiac catheterization  10/15/2009    which showed low normal LV function with mild inferior hypocontractility.    Family History  Problem Relation Age of Onset  . Heart disease Mother    Social History:  reports that she has been smoking Cigarettes.  She started smoking about 40 years ago. She has been smoking about 0.50 packs per day. She has never used smokeless tobacco. She reports that she does not drink alcohol or use illicit drugs.  Allergies:  Allergies  Allergen Reactions  . Codeine   . Sulfa Antibiotics     Medications Prior to Admission  Medication Sig Dispense Refill  . b complex-vitamin c-folic acid (NEPHRO-VITE) 0.8 MG TABS Take 0.8 mg by mouth daily.      Marland Kitchen  calcium acetate (PHOSLO) 667 MG capsule Take 1 capsule by mouth daily.      Marland Kitchen docusate sodium (COLACE) 100 MG capsule Take 100 mg by mouth as needed.      . fosinopril (MONOPRIL) 10 MG tablet Take 10 mg by mouth daily.      . insulin NPH-insulin regular (NOVOLIN 70/30) (70-30) 100 UNIT/ML injection Inject into the skin. As directed.      . loperamide (IMODIUM A-D) 2 MG tablet Take 2 mg by mouth 2 (two) times daily as needed.      . metoprolol succinate (TOPROL-XL) 50 MG 24 hr tablet Take 1 tablet (50 mg total) by mouth daily.  30 tablet  11    No results found for this or any previous visit (from the past 48 hour(s)). No results found.  Review of Systems   Constitutional: Positive for diaphoresis. Negative for fever.  HENT: Positive for congestion. Negative for sore throat.   Respiratory: Positive for cough and shortness of breath.   Cardiovascular: Positive for chest pain, palpitations, orthopnea and leg swelling. Negative for PND.  Gastrointestinal: Positive for nausea. Negative for vomiting, abdominal pain, blood in stool and melena.  Genitourinary: Negative for hematuria.  Musculoskeletal: Negative for myalgias.  Neurological: Negative for dizziness.  All other systems reviewed and are negative.    Blood pressure 137/46, pulse 58, resp. rate 19, SpO2 100.00%. Physical Exam  Nursing note and vitals reviewed. Constitutional: She is oriented to person, place, and time. She appears well-developed and well-nourished. No distress.  HENT:  Head: Normocephalic and atraumatic.  Mouth/Throat: Oropharynx is clear and moist. No oropharyngeal exudate.  Eyes: EOM are normal. Pupils are equal, round, and reactive to light. No scleral icterus.  Neck: Normal range of motion. Neck supple. JVD present.  Cardiovascular: Normal rate, regular rhythm, S1 normal and S2 normal.   Murmur heard.  Systolic murmur is present with a grade of 3/6  Pulses:      Radial pulses are 2+ on the right side, and 2+ on the left side.       Dorsalis pedis pulses are 1+ on the right side, and 1+ on the left side.  No carotid Bruit  Respiratory: Effort normal. She has rales.  GI: Soft. Bowel sounds are normal. She exhibits no distension. There is no tenderness.  Palpable liver  Musculoskeletal: She exhibits edema (2+ pitting LEE).  Lymphadenopathy:    She has no cervical adenopathy.  Neurological: She is alert and oriented to person, place, and time. She exhibits normal muscle tone.  Skin: Skin is warm and dry.  Psychiatric: She has a normal mood and affect.     Assessment/Plan Principal Problem:   NSTEMI (non-ST elevated myocardial infarction) Active Problems:    Aortic stenosis, severe, AVA0.4cm^2, LAst echo 01/2013   PAF (paroxysmal atrial fibrillation)   End stage renal disease   DM (diabetes mellitus)   HTN (hypertension)   HLD (hyperlipidemia)   COPD (chronic obstructive pulmonary disease)   Volume overload  Plan:  NSTEMI- which appears to be demand related.  Ischemic evaluation is needed. Will start IV heparin and NTG drip if CP returns.  She is volume overloaded which is likely related to severe AS and moderate MR.  Cycle troponin.  Will need to consider anticoagulation, however, with recent syncopal episode, that might not be a good idea.    HAGER, BRYAN 07/04/2013, 2:48 PM   Patient seen and examined. Agree with assessment and plan. Pt is a 72 year old Latin American  female with end-stage renal disease on dialysis Monday Wednesday and Fridays. In 2011 she underwent CABG surgery x3 and had a LIMA placed to her LAD, vein to the circumflex, and vein to the PDA. At that time her aortic stenosis was mild. Recently she's been documented to have severe aortic valve stenosis and has developed progressive symptoms of shortness of breath, and chest tightness during dialysis. I had seen her in the office approximately 10 days ago and recommended definitive right and left heart catheterization. This was scheduled to be done in the next week. Today, while at dialysis in [place], the patient developed atrial fibrillation with a rapid ventricular response associated with chest pressure. She was taken to [facility]. Her ECG was unremarkable. At that time she had complete resolution of chest pain. Initial troponin was 10.6. She now is admitted in transfer from [facility]. She did not complete her entire dialysis today. Presently, we will start IV heparin therapy. I did discuss definitive right and left heart catheterization with the patient. She wishes that I perform this study . We'll try to stabilize her monitor enzymes and cardiac rhythm, and plan  catheterization procedure at my next hospital day one Friday, 07/06/2013.   Lennette Bihari, MD, [facility] 07/04/2013 5:01 PM

## 2013-07-04 NOTE — Progress Notes (Signed)
CRITICAL VALUE ALERT  Critical value received:  Troponin  Date of notification:  07/04/2013  Time of notification:  1735  Critical value read back: Yes  Nurse who received alert:  Gordy Councilman, RN  MD notified (1st page):   Time of first page:  1750  MD notified (2nd page):Bryan Leron Croak, [place]  Time of second page:  Responding MD:  Wilburt Finlay, PA  Time MD responded:  [phone]

## 2013-07-04 NOTE — Progress Notes (Signed)
Two needles removed from dialysis graft in the left arm.  Needles intact, Vaseline pressure gauze to site.  Pressure held by IV nurse for 15 min followed by another 15 min of pressure being held by staff nurse. [name]

## 2013-07-04 NOTE — Consult Note (Signed)
[facility] Renal Consultation Note  Indication for Consultation:  Management of ESRD/hemodialysis; anemia, hypertension/volume and secondary hyperparathyroidism  HPI: [name] is a 72 y.o. female with ho ESRD (sec . DM /HTN) CAD Non -STEMI feb 2011. Today at out pt hd she developed CP and became diaphoretic and noted to be in  afib RVR with a HR in the 140's. She was taken to [facility]. Initial troponin was 10.6.An dthen after stabilizing transferred to [facility]. In feb 2011 she has a  cath that  showed CAD  and mild aortic stenosis at catheterization with pulmonary hypertension with a PA pressure of 45mm and a mean aortic gradient of 10 mm. She underwent CABG surgery x3 by Dr. Donata Clay . She had an intraoperative TEE that  confirmed mild found aortic stenosis it was not felt that this was severe enough to warrant replacement at that time.  Currently she is pain free with monitor showing SR. She had 2 hrs of a 3hr treatment time before her tx was stopped with the chest pain and tachycardia. She reports to me  Some episodes of chest tightness past few months that resided on its on with rest. She reports a syncopal episode several months ago where she blacked out / she had a Left Hip contusion and moved  To a rehab facility in [place]  And moved back with her son.  ..       Past Medical History  Diagnosis Date  . CHF (congestive heart failure)   . Diabetes mellitus   . Chronic kidney disease   . Hyperlipidemia   . Anemia   . Hypertension   . COPD (chronic obstructive pulmonary disease)   . Peripheral vascular disease   . Coronary artery disease   . Hx of echocardiogram 03/2011    showed a mean gradient that had increased to 41 and a peak gradient that had increase to 70, she has moderate tricuspid regurigitation, mild to moderate mitral regurigitation, and significant LA dilation.    Past Surgical History  Procedure Laterality Date  . Coronary artery bypass  graft      x3 by Dr Alla German with LIMA to the LAD, a vein to the circumflex and a vien to the PDA.  Marland Kitchen Abdominal hysterectomy    . Thoracentesis  11-2009  . Av fistula placement  01-12-2008    left upper arm  . Cardiac catheterization  10/15/2009    which showed low normal LV function with mild inferior hypocontractility.      Family History  Problem Relation Age of Onset  . Heart disease Mother   Social= Lives in [place] with Son and    reports that she has been smoking Cigarettes.  She started smoking about 40 years ago. She has been smoking about 0.50 packs per day. She has never used smokeless tobacco. She reports that she does not drink alcohol or use illicit drugs.   Allergies  Allergen Reactions  . Codeine   . Sulfa Antibiotics     Prior to Admission medications   Medication Sig Start Date End Date Taking? Authorizing Provider  aspirin EC 325 MG tablet Take 325 mg by mouth daily as needed for moderate pain.   Yes Historical Provider, MD  calcium acetate (PHOSLO) 667 MG capsule Take 1,334 mg by mouth 3 (three) times daily with meals.  05/15/13  Yes Historical Provider, MD  docusate sodium (COLACE) 100 MG capsule Take 100 mg by mouth as needed for mild  constipation.    Yes Historical Provider, MD  insulin NPH-insulin regular (NOVOLIN 70/30) (70-30) 100 UNIT/ML injection Inject into the skin 2 (two) times daily with a meal. As directed per sliding scale   Yes Historical Provider, MD  metoprolol succinate (TOPROL-XL) 50 MG 24 hr tablet Take 1 tablet (50 mg total) by mouth daily. 06/19/13  Yes Lennette Bihari, MD    WJX:BJYNWGNFAOZHY, nitroGLYCERIN, ondansetron (ZOFRAN) IV  .  ROS: see hpi for positives  Physical Exam: Filed Vitals:   07/04/13 1544  BP:   Pulse:   Temp: 97.8 F (36.6 C)  Resp:      General: Alert elderly BF NAD, Appropriate , talkative  HEENT: [place] , MMM Eyes:  eomi  Neck: supple Heart: RRR/ 3/6 hsm  Apex. No rub, no gallop Lungs:  Faint bibasilar  rales Abdomen:  bs pos. , soft, nontender Extremities:  bipedal edema 1+ Skin:  No overt rash or ulcers Neuro: OX3 , moves all extrem.  Dialysis Access: Pos. Bruit LU A AVF  Dialysis Orders: Center: [facility] MWF  . EDW 53 HD Bath 2.0 2.25 ca/ 53 kg edw Heparin  Access LUA AVF BFR 400 DFR auto flow 1.5   Hectorol2 mcg IV/HD Epogen1600   Units IV/HD  Venofer  0  Recent labs 06/27/13 hgb 11.9 tfs= 31 %  Ferritin 1537, ipth  316 ca 9.9 phos 4.6  Assessment/Plan 1. NSTEMI= card managing 2. AOS severe card RX 3. Afib (( RVR )= resolved now Card rx 4. ESRD -  MWF HD ( [facility]) ( didn't  finish hd today but cxr at [facility] showing no edema/ mild edema on exam but not warrenting HD tonight/ fu labs 5. Hypertension/volume  - Bp 163/63  / no vol on cxr/wt 55 kg ( edw 53) Lopressor per card. 6. Anemia  -  hgb 12.1 at [place] / hold aranesp with hgb >12 follow am labs  7. Metabolic bone disease -  Hectorol on hd/ Calcium Acetate with meals 8. DM = diet controlled  Lenny Pastel, PA-C [facility] Beeper [phone] 07/04/2013, 4:00 PM   I have seen and examined patient, discussed with PA and agree with assessment and plan as outlined above. Vinson Moselle MD pager [phone]    cell [phone] 07/04/2013, 5:13 PM

## 2013-07-04 NOTE — Progress Notes (Signed)
ANTICOAGULATION CONSULT NOTE - Initial Consult  Pharmacy Consult:  Heparin Indication:  ACS  Allergies  Allergen Reactions  . Codeine   . Sulfa Antibiotics     Patient Measurements: Height: 5\' 4"  (162.6 cm) Weight: 121 lb 4.1 oz (55 kg) IBW/kg (Calculated) : 54.7 Heparin Dosing Weight: 55 kg  Vital Signs: BP: 148/55 mmHg (11/05 1500) Pulse Rate: 57 (11/05 1500)  Labs: No results found for this basename: HGB, HCT, PLT, APTT, LABPROT, INR, HEPARINUNFRC, CREATININE, CKTOTAL, CKMB, TROPONINI,  in the last 72 hours  Estimated Creatinine Clearance: 10.5 ml/min (by C-G formula based on Cr of 4.32).   Medical History: Past Medical History  Diagnosis Date  . CHF (congestive heart failure)   . Diabetes mellitus   . Chronic kidney disease   . Hyperlipidemia   . Anemia   . Hypertension   . COPD (chronic obstructive pulmonary disease)   . Peripheral vascular disease   . Coronary artery disease   . Hx of echocardiogram 03/2011    showed a mean gradient that had increased to 41 and a peak gradient that had increase to 70, she has moderate tricuspid regurigitation, mild to moderate mitral regurigitation, and significant LA dilation.        Assessment: 38 YOF with a significant cardiac history who experienced chest pain and Afib with RVR during hemodialysis today.  Patient was taken to [facility] and now transferred to [facility] for further care.  Pharmacy consulted to manage IV heparin for ACS.  Noted patient's platelets is 128 at [place].  Patient reports she did not have any significant bleeding when she fell a month ago.   Goal of Therapy:  Heparin level 0.3-0.7 units/ml Monitor platelets by anticoagulation protocol: Yes    Plan:  - Heparin 2500 units IV x 1, then - Heparin gtt at 650 units/hr - Check 8 hr HL - Daily HL / CBC    Fong Mccarry D. Laney Potash, PharmD, BCPS Pager:  [phone] 07/04/2013, 3:56 PM

## 2013-07-04 NOTE — Progress Notes (Signed)
Patient ID: [name], female   DOB: [birth year], 72 y.o.   MRN: [id number]     HPI: [name] is a 72 y.o. female presents to the office for cardiology evaluation.  [name] is a 52 -year-old African American female who has end-stage renal disease for which he is seeing [name] in the past and undergoes hemodialysis at [name] on Monday Wednesday and Fridays. He felt great 2011 she suffered a non-ST segment elevation myocardial infarction. During that time she was found to have mild aortic stenosis at catheterization with moderate pulmonary hypertension with a PA pressure 45 mm per mean aortic valve gradient was 10 mm. She underwent CABG surgery x3 by [name] and had a LIMA placed to her LAD, a vein to her circumflex, and a vein to the PDA. Intraoperative TEE confirmed mild aortic stenosis it was not felt that this was severe enough to warrant valve replacement at that time.  An echo Doppler study in August 2012 centimeter gradient had increased to 41 and a peak gradient had increased to 70. She had mild to moderate mitral regurgitation, moderate tricuspid regurgitation, and significant LA dilatation. I had seen her in March 2014 at that time she was doing well without anginal symptoms. She denied any presyncope. His establish primary care with [name]. She did have some difficulty with hip discomfort. Apparently, in August 2014 she was admitted to [name] after falling while walking with a walker. She had a negative CT of her head. She also is felt to have a left hip contusion.  [name] underwent followup echo Doppler study in June 2014 to reassess aortic stenosis. Ejection fraction was 55-60% and she had severe left ventricular hypertrophy. She had markedly abnormal tissue Doppler and had a restrictive left ventricular relaxation grade 3 physiology of diastolic dysfunction. Her peak and mean gradients of her aortic valve were 70 and 43 mm, respectively. Based on  LVOT diameter of 1.8 cm the calculated aortic valve area was 0.4 cm consistent with very severe aortic stenosis. She apparently presents to the office today for evaluation.  [name] does admit to having episodes of increasing shortness of breath. She also has noticed some episodes of chest tightness which he experiences during dialysis. Times her blood pressure does get low during dialysis the  Past Medical History  Diagnosis Date  . CHF (congestive heart failure)   . Diabetes mellitus   . Chronic kidney disease   . Hyperlipidemia   . Anemia   . Hypertension   . COPD (chronic obstructive pulmonary disease)   . Peripheral vascular disease   . Coronary artery disease   . Hx of echocardiogram 03/2011    showed a mean gradient that had increased to 41 and a peak gradient that had increase to 70, she has moderate tricuspid regurigitation, mild to moderate mitral regurigitation, and significant LA dilation.    Past Surgical History  Procedure Laterality Date  . Coronary artery bypass graft      x3 by Dr [name] with LIMA to the LAD, a vein to the circumflex and a vien to the PDA.  Marland Kitchen Abdominal hysterectomy    . Thoracentesis  11-2009  . Av fistula placement  01-12-2008    left upper arm  . Cardiac catheterization  10/15/2009    which showed low normal LV function with mild inferior hypocontractility.    Allergies  Allergen Reactions  . Codeine   . Sulfa Antibiotics  No current facility-administered medications for this visit.   No current outpatient prescriptions on file.   Facility-Administered Medications Ordered in Other Visits  Medication Dose Route Frequency Provider Last Rate Last Dose  . acetaminophen (TYLENOL) tablet 650 mg  650 mg Oral Q4H PRN Cassie Finlay, PA-C      . [START ON 07/05/2013] aspirin EC tablet 81 mg  81 mg Oral Daily Cassie Finlay, PA-C      . atorvastatin (LIPITOR) tablet 40 mg  40 mg Oral q1800 Cassie Finlay, PA-C      . heparin ADULT infusion 100  units/mL (25000 units/250 mL)  650 Units/hr Intravenous Continuous [name], [facility]      . heparin bolus via infusion 2,500 Units  2,500 Units Intravenous Once [name], [facility]      . insulin aspart (novoLOG) injection 0-9 Units  0-9 Units Subcutaneous TID WC Cassie Finlay, PA-C      . metoprolol tartrate (LOPRESSOR) tablet 25 mg  25 mg Oral BID Cassie Finlay, PA-C      . nitroGLYCERIN (NITROSTAT) SL tablet 0.4 mg  0.4 mg Sublingual Q5 Min x 3 PRN Cassie Finlay, PA-C      . ondansetron (ZOFRAN) injection 4 mg  4 mg Intravenous Q6H PRN Cassie Finlay, PA-C        History   Social History  . Marital Status: Widowed    Spouse Name: N/A    Number of Children: N/A  . Years of Education: N/A   Occupational History  . Not on file.   Social History Main Topics  . Smoking status: Current Every Day Smoker -- 0.50 packs/day    Types: Cigarettes    Start date: 06/19/1973  . Smokeless tobacco: Never Used  . Alcohol Use: No  . Drug Use: No  . Sexual Activity: Not on file   Other Topics Concern  . Not on file   Social History Narrative  . No narrative on file    Family History  Problem Relation Age of Onset  . Heart disease Mother    Socially, he lives with release who she is raised. There is a history tobacco use. No alcohol use. She does walk with a walker.  ROS is negative for fevers, chills or night sweats. She denies visual changes. She does have poor dentition and is missing several teeth. She denies wheezing. She admits to shortness of breath with activity. She does experience chest tightness during dialysis. She denies cough or change in sputum production. She denies abdominal bleeding. She has had bleeding in the past from her AV fistula sites for dialysis. She denies blood in her stool. She does have a history of anemia. She does have a history of diabetes mellitus for which he takes 7030 Novolin insulin. There is a history of hypertension. History  of COPD. She does occasional  paresthesias. She denies restless legs. Is no history of hypothyroidism.   Other comprehensive 12 point system review is negative.  PE BP 150/78  Pulse 64  Ht 5\' 4"  (1.626 m)  Wt 121 lb 4.8 oz (55.021 kg)  BMI 20.81 kg/m2  General: Alert, oriented, no distress.  Skin: normal turgor, no rashes HEENT: Normocephalic, atraumatic. Pupils round and reactive; sclera anicteric;no lid lag.  Nose without nasal septal hypertrophy Mouth/Parynx benign; Mallinpatti scale 3 Neck: No JVD, no carotid briuts Lungs: clear to ausculatation and percussion; no wheezing or rales Heart: RRR, s1 s2 normal 2/6 aortic stenosis murmur, no S3 Abdomen: soft, nontender; no  hepatosplenomehaly, BS+; abdominal aorta nontender and not dilated by palpation. Pulses 2+ Extremities: no clubbing cyanosis or edema, Homan's sign negative  Neurologic: grossly nonfocal Psychologic: normal affect and mood.  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  LABS:  BMET    Component Value Date/Time   NA 140 06/19/2013 1428   K 4.4 06/19/2013 1428   CL 96 06/19/2013 1428   CO2 30 06/19/2013 1428   GLUCOSE 269* 06/19/2013 1428   BUN 24* 06/19/2013 1428   CREATININE 4.32* 06/19/2013 1428   CREATININE 4.18* 11/12/2010 0550   CALCIUM 9.5 06/19/2013 1428   GFRNONAA 11* 11/12/2010 0550   GFRAA  Value: 13        The eGFR has been calculated using the MDRD equation. This calculation has not been validated in all clinical situations. eGFR's persistently <60 mL/min signify possible Chronic Kidney Disease.* 11/12/2010 0550     Hepatic Function Panel     Component Value Date/Time   PROT 6.5 06/19/2013 1428   ALBUMIN 4.0 06/19/2013 1428   AST 18 06/19/2013 1428   ALT 17 06/19/2013 1428   ALKPHOS 135* 06/19/2013 1428   BILITOT 0.4 06/19/2013 1428     CBC    Component Value Date/Time   WBC 5.6 07/04/2013 1620   RBC 3.75* 07/04/2013 1620   HGB 11.2* 07/04/2013 1620   HCT 34.2*  07/04/2013 1620   PLT 139* 07/04/2013 1620   MCV 91.2 07/04/2013 1620   MCH 29.9 07/04/2013 1620   MCHC 32.7 07/04/2013 1620   RDW 16.7* 07/04/2013 1620   LYMPHSABS 1.4 07/04/2013 1620   MONOABS 0.7 07/04/2013 1620   EOSABS 0.2 07/04/2013 1620   BASOSABS 0.1 07/04/2013 1620     BNP No results found for this basename: probnp    Lipid Panel  No results found for this basename: chol, trig, hdl, cholhdl, vldl, ldlcalc     RADIOLOGY: Dg Chest 2 View  06/19/2013   CLINICAL DATA:  Preprocedure evaluation. History of coughing. History of aortic stenosis, congestive heart failure, diabetes, chronic kidney disease, hypertension, COPD, and coronary artery disease.  EXAM: CHEST  2 VIEW  COMPARISON:  12/24/2009. 04/12/2013.  FINDINGS: There is enlargement of the cardiac silhouette which appears slightly larger than on previous study. The patient has undergone previous median sternotomy and coronary artery bypass grafting. There is slight upper lobe vascular prominence vascular congestion pattern. There is atelectasis and infiltrate which has developed in the right medial base with associated right pleural effusion which has developed since previous study. Linear subsegmental atelectasis or fibrosis is unchanged in the left base. Flattening of the diaphragm on lateral image is seen which may reflect element of hyperinflation. Ectasia and nonaneurysmal calcification of the thoracic aorta are seen. There is osteopenic appearance of bones. Changes of degenerative disk disease and degenerative spondylosis are present.  IMPRESSION: Enlargement of the cardiac silhouette which appears slightly larger than on previous study. Post CABG and median sternotomy. Mild upper lobe vascular congestion pattern. Atelectasis and infiltrative densities have developed in the right medial base with associated right pleural effusion since previous study. Stable atelectasis or subsegmental fibrosis is unchanged in left base. Flattening of  diaphragm on lateral image may reflect evidence of element of overall hyperinflation and COPD. Marland Kitchen   Electronically Signed   By: Onalee Hua  Call M.D.   On: 06/19/2013 15:51      ASSESSMENT AND PLAN:  I had a long discussion with the patient and her  son. I'm concerned that the patient is status post CABG revascularization surgery and has developed progressive severe aortic valve stenosis. She clearly is symptomatic with both shortness of breath as well as development of chest tightness which seems to occur during her dialysis episodes and has had difficulty with hypotension during dialysis. She also has significant additional cardiac risk factors. After long discussion it is my recommendation that she undergo right and left heart catheterization. The patient  undergoes dialysis on Monday Wednesday and Fridays. We will try to arrange this on a Tuesday or Thursday such that she can then undergo dialysis on her planned days. This will be scheduled over the next several weeks and further recommendations will be made at that time.   Lennette Bihari, MD, [facility]  07/04/2013 4:44 PM

## 2013-07-05 LAB — BASIC METABOLIC PANEL
BUN: 28 mg/dL — ABNORMAL HIGH (ref 6–23)
CO2: 23 mEq/L (ref 19–32)
Chloride: 95 mEq/L — ABNORMAL LOW (ref 96–112)
GFR calc Af Amer: 13 mL/min — ABNORMAL LOW (ref >90)
GFR calc non Af Amer: 11 mL/min — ABNORMAL LOW (ref >90)
Glucose, Bld: 145 mg/dL — ABNORMAL HIGH (ref 70–99)
Potassium: 3.4 mEq/L — ABNORMAL LOW (ref 3.5–5.1)
Sodium: 138 mEq/L (ref 135–145)

## 2013-07-05 LAB — GLUCOSE, CAPILLARY
Glucose-Capillary: 150 mg/dL — ABNORMAL HIGH (ref 70–99)
Glucose-Capillary: 289 mg/dL — ABNORMAL HIGH (ref 70–99)

## 2013-07-05 LAB — LIPID PANEL
Total CHOL/HDL Ratio: 1.5 RATIO
Triglycerides: 47 mg/dL (ref <150)
VLDL: 9 mg/dL (ref 0–40)

## 2013-07-05 LAB — PHOSPHORUS: Phosphorus: 5.6 mg/dL — ABNORMAL HIGH (ref 2.3–4.6)

## 2013-07-05 LAB — HEPARIN LEVEL (UNFRACTIONATED)
Heparin Unfractionated: 0.3 IU/mL (ref 0.30–0.70)
Heparin Unfractionated: 0.35 IU/mL (ref 0.30–0.70)

## 2013-07-05 LAB — CBC
HCT: 36.9 % (ref 36.0–46.0)
Hemoglobin: 12 g/dL (ref 12.0–15.0)
MCHC: 32.5 g/dL (ref 30.0–36.0)
MCV: 92.5 fL (ref 78.0–100.0)
RBC: 3.99 MIL/uL (ref 3.87–5.11)

## 2013-07-05 MED ORDER — LIDOCAINE-PRILOCAINE 2.5-2.5 % EX CREA
1.0000 "application " | TOPICAL_CREAM | CUTANEOUS | Status: DC | PRN
  Filled 2013-07-05: qty 5

## 2013-07-05 MED ORDER — SODIUM CHLORIDE 0.9 % IJ SOLN: 3.0000 mL | INTRAMUSCULAR | Status: DC | PRN

## 2013-07-05 MED ORDER — SODIUM CHLORIDE 0.9 % IV SOLN: 250.0000 mL | INTRAVENOUS | Status: DC | PRN

## 2013-07-05 MED ORDER — HEPARIN SODIUM (PORCINE) 1000 UNIT/ML DIALYSIS: 1000.0000 [IU] | INTRAMUSCULAR | Status: DC | PRN

## 2013-07-05 MED ORDER — NEPRO/CARBSTEADY PO LIQD
237.0000 mL | ORAL | Status: DC | PRN
  Filled 2013-07-05: qty 237

## 2013-07-05 MED ORDER — PENTAFLUOROPROP-TETRAFLUOROETH EX AERO: 1.0000 "application " | INHALATION_SPRAY | CUTANEOUS | Status: DC | PRN

## 2013-07-05 MED ORDER — ASPIRIN 81 MG PO CHEW
81.0000 mg | CHEWABLE_TABLET | ORAL | Status: AC
  Administered 2013-07-06: 81 mg via ORAL
  Filled 2013-07-05: qty 1

## 2013-07-05 MED ORDER — SODIUM CHLORIDE 0.9 % IV SOLN: 100.0000 mL | INTRAVENOUS | Status: DC | PRN

## 2013-07-05 MED ORDER — ALTEPLASE 2 MG IJ SOLR
2.0000 mg | Freq: Once | INTRAMUSCULAR | Status: AC | PRN
  Filled 2013-07-05: qty 2

## 2013-07-05 MED ORDER — SODIUM CHLORIDE 0.9 % IV SOLN
INTRAVENOUS | Status: DC
  Administered 2013-07-06: 06:00:00 via INTRAVENOUS

## 2013-07-05 MED ORDER — LIDOCAINE HCL (PF) 1 % IJ SOLN: 5.0000 mL | INTRAMUSCULAR | Status: DC | PRN

## 2013-07-05 MED ORDER — SODIUM CHLORIDE 0.9 % IJ SOLN
3.0000 mL | Freq: Two times a day (BID) | INTRAMUSCULAR | Status: DC
  Administered 2013-07-05: 3 mL via INTRAVENOUS

## 2013-07-05 NOTE — Progress Notes (Signed)
ANTICOAGULATION CONSULT NOTE - Follow Up Consult  Pharmacy Consult for Heparin Indication: atrial fibrillation and elevated troponin  Allergies  Allergen Reactions  . Codeine   . Sulfa Antibiotics     Patient Measurements: Height: 5\' 4"  (162.6 cm) Weight: 120 lb 13 oz (54.8 kg) IBW/kg (Calculated) : 54.7 Heparin Dosing Weight:  55 kg  Vital Signs: Temp: 98 F (36.7 C) (11/06 0738) Temp src: Oral (11/06 0738) BP: 126/40 mmHg (11/06 1000) Pulse Rate: 59 (11/06 1000)  Labs:  Recent Labs  07/04/13 1620 07/04/13 2123 07/05/13 0058 07/05/13 0545 07/05/13 0830  HGB 11.2*  --   --  12.0  --   HCT 34.2*  --   --  36.9  --   PLT 139*  --   --  136*  --   HEPARINUNFRC  --   --  0.22*  --  0.30  CREATININE 3.25*  --   --  3.85*  --   TROPONINI  --  4.34* 8.71* 3.85*  --     Estimated Creatinine Clearance: 11.7 ml/min (by C-G formula based on Cr of 3.85).  Heparin at 750 units/hr  Assessment: 5 YOF with a significant cardiac history who experienced chest pain and Afib with RVR during hemodialysis 11/5. +troponins. Heparin level at low end of goal on 750 units/hr.  Hgb stable, no bleeding noted.   Goal of Therapy:  Heparin level 0.3-0.7 units/ml Monitor platelets by anticoagulation protocol: Yes   Plan:  Increase IV heparin to 800 units/hr to target mid-range goal.   Heparin level in 6 hours. Heparin level and CBC daily while on heparin. Follow-up plans for cardiac cath.  [employer], Pharm.D., BCPS Clinical Pharmacist Pager [phone] 07/05/2013 11:14 AM

## 2013-07-05 NOTE — Progress Notes (Signed)
ANTICOAGULATION CONSULT NOTE - Follow Up Consult  Pharmacy Consult for Heparin Indication: atrial fibrillation and elevated troponin  Allergies  Allergen Reactions  . Codeine   . Sulfa Antibiotics     Patient Measurements: Height: 5\' 4"  (162.6 cm) Weight: 121 lb 4.1 oz (55 kg) IBW/kg (Calculated) : 54.7 Heparin Dosing Weight:  55 kg  Vital Signs: Temp: 98.1 F (36.7 C) (11/05 2250) Temp src: Oral (11/05 2250) BP: 124/42 mmHg (11/06 0100) Pulse Rate: 53 (11/06 0100)  Labs:  Recent Labs  07/04/13 1619 07/04/13 1620 07/04/13 2123 07/05/13 0058  HGB  --  11.2*  --   --   HCT  --  34.2*  --   --   PLT  --  139*  --   --   HEPARINUNFRC  --   --   --  0.22*  CREATININE  --  3.25*  --   --   TROPONINI 7.04*  --  4.34*  --     Estimated Creatinine Clearance: 13.9 ml/min (by C-G formula based on Cr of 3.25).   Assessment: 42 YOF with a significant cardiac history who experienced chest pain and Afib with RVR during hemodialysis. +troponins. Heparin level 0.22 less than goal.  Goal of Therapy:  Heparin level 0.3-0.7 units/ml Monitor platelets by anticoagulation protocol: Yes   Plan:  Increase IV heparin too 750 units/hr and recheck level in 6 hrs.  Merilynn Finland, [employer] 07/05/2013,1:45 AM

## 2013-07-05 NOTE — Progress Notes (Signed)
  [facility] Progress Note    Subjective: sitting up eating breakfast, no complaints, on IV hep for acs   Exam  Blood pressure 123/42, pulse 53, temperature 98 F (36.7 C), temperature source Oral, resp. rate 18, height 5\' 4"  (1.626 m), weight 54.8 kg (120 lb 13 oz), SpO2 100.00%. Gen: Alert elderly BF NAD, Appropriate , talkative  Neck: supple  Heart: RRR/ 3/6 hsm, no rub, no gallop  Lungs: faint bibasilar rales  Abd: bs pos. , soft, nontender  Ext: bipedal edema 1+  Skin: No overt rash or ulcers  Neuro: OX3 , moves all extrem.  Access: Pos. Bruit LU A AVF   Dialysis Orders: Center: [facility] MWF .  53kg    2K/2.25Ca   LUA AVF Hectorol 2ug    Epogen1600    Venofer none Recent labs 06/27/13 hgb 11.9 tfs= 31 % Ferritin 1537, ipth 316 ca 9.9 phos 4.6   Assessment/Plan  1. NSTEMI= card managing, heart cath prob tomorrow 2. AOS severe card RX 3. Afib ( RVR )= resolved now Card rx 4. ESRD MWF - next HD tomorrow or Sat  5. Hypertension/volume - 163/63 / up 2kg, no resp compromise 6. 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 DM = diet controlled    Vinson Moselle MD  pager [phone]    cell [phone]  07/05/2013, 9:04 AM   Recent Labs Lab 07/04/13 1620 07/05/13 0545  NA 137 138  K 3.2* 3.4*  CL 94* 95*  CO2 29 23  GLUCOSE 262* 145*  BUN 20 28*  CREATININE 3.25* 3.85*  CALCIUM 8.9 9.6  PHOS  --  5.6*    Recent Labs Lab 07/04/13 1620  AST 41*  ALT 34  ALKPHOS 183*  BILITOT 0.2*  PROT 6.7  ALBUMIN 3.5    Recent Labs Lab 07/04/13 1620 07/05/13 0545  WBC 5.6 6.7  NEUTROABS 3.3  --   HGB 11.2* 12.0  HCT 34.2* 36.9  MCV 91.2 92.5  PLT 139* 136*   . aspirin EC  81 mg Oral Daily  . atorvastatin  40 mg Oral q1800  . calcium acetate  667 mg Oral TID WC  . [START ON 07/06/2013] doxercalciferol  2 mcg Intravenous Q M,W,F-HD  . insulin aspart  0-9 Units Subcutaneous TID WC  . metoprolol  tartrate  25 mg Oral BID  . multivitamin  1 tablet Oral QHS   . heparin 750 Units/hr (07/05/13 0600)   acetaminophen, nitroGLYCERIN, ondansetron (ZOFRAN) IV

## 2013-07-05 NOTE — Progress Notes (Signed)
Pt arrived from unit 2S, to room 2H20. Plan for cath tomorrow. Pt oriented to room and call bell, resting without c/o.

## 2013-07-05 NOTE — Progress Notes (Addendum)
Subjective: No CP or SOB  Objective: Vital signs in last 24 hours: Temp:  [97.8 F (36.6 C)-98.1 F (36.7 C)] 98 F (36.7 C) (11/06 0738) Pulse Rate:  [51-63] 59 (11/06 1000) Resp:  [15-27] 21 (11/06 1000) BP: (106-163)/(39-76) 126/40 mmHg (11/06 1000) SpO2:  [96 %-100 %] 99 % (11/06 1000) Weight:  [120 lb 13 oz (54.8 kg)-121 lb 4.1 oz (55 kg)] 120 lb 13 oz (54.8 kg) (11/06 0600) Last BM Date: 07/05/13  Intake/Output from previous day: 11/05 0701 - 11/06 0700 In: 151.7 [P.O.:60; I.V.:91.7] Out: 10 [Urine:10] Intake/Output this shift: Total I/O In: 322.5 [P.O.:300; I.V.:22.5] Out: 5 [Urine:5]  Medications Current Facility-Administered Medications  Medication Dose Route Frequency Provider Last Rate Last Dose  . acetaminophen (TYLENOL) tablet 650 mg  650 mg Oral Q4H PRN Wilburt Finlay, PA-C   650 mg at 07/05/13 0956  . aspirin EC tablet 81 mg  81 mg Oral Daily Wilburt Finlay, PA-C   81 mg at 07/05/13 0956  . atorvastatin (LIPITOR) tablet 40 mg  40 mg Oral q1800 Wilburt Finlay, PA-C   40 mg at 07/04/13 1710  . calcium acetate (PHOSLO) capsule 667 mg  667 mg Oral TID WC Donald Pore, PA-C   667 mg at 07/05/13 0857  . [START ON 07/06/2013] doxercalciferol (HECTOROL) injection 2 mcg  2 mcg Intravenous Q M,W,F-HD Donald Pore, PA-C      . heparin ADULT infusion 100 units/mL (25000 units/250 mL)  750 Units/hr Intravenous Continuous Crystal Stillinger Robertson, RPH 7.5 mL/hr at 07/05/13 1000 750 Units/hr at 07/05/13 1000  . insulin aspart (novoLOG) injection 0-9 Units  0-9 Units Subcutaneous TID WC Wilburt Finlay, PA-C   1 Units at 07/05/13 0903  . metoprolol tartrate (LOPRESSOR) tablet 25 mg  25 mg Oral BID Wilburt Finlay, PA-C   25 mg at 07/05/13 0956  . multivitamin (RENA-VIT) tablet 1 tablet  1 tablet Oral QHS Donald Pore, PA-C   1 tablet at 07/04/13 2130  . nitroGLYCERIN (NITROSTAT) SL tablet 0.4 mg  0.4 mg Sublingual Q5 Min x 3 PRN Wilburt Finlay, PA-C      . ondansetron (ZOFRAN)  injection 4 mg  4 mg Intravenous Q6H PRN Wilburt Finlay, PA-C        PE: General appearance: alert, cooperative and no distress Lungs: +Crackles and rhonchi which clears somewhat with Cough. Heart: regular rate and rhythm and 3/6 sys MM Extremities: 1+ LEE Pulses: 2+ and symmetric, 1+ DPs Skin: Warm and dry Neurologic: Grossly normal  Lab Results:   Recent Labs  07/04/13 1620 07/05/13 0545  WBC 5.6 6.7  HGB 11.2* 12.0  HCT 34.2* 36.9  PLT 139* 136*   BMET  Recent Labs  07/04/13 1620 07/05/13 0545  NA 137 138  K 3.2* 3.4*  CL 94* 95*  CO2 29 23  GLUCOSE 262* 145*  BUN 20 28*  CREATININE 3.25* 3.85*  CALCIUM 8.9 9.6   PT/INR No results found for this basename: LABPROT, INR,  in the last 72 hours Cholesterol  Recent Labs  07/05/13 0545  CHOL 152   Lipid Panel     Component Value Date/Time   CHOL 152 07/05/2013 0545   TRIG 47 07/05/2013 0545   HDL 99 07/05/2013 0545   CHOLHDL 1.5 07/05/2013 0545   VLDL 9 07/05/2013 0545   LDLCALC 44 07/05/2013 0545    Cardiac Panel (last 3 results)  Recent Labs  07/04/13 2123 07/05/13 0058 07/05/13 0545  TROPONINI 4.34* 8.71* 3.85*  Assessment/Plan  Principal Problem:   NSTEMI (non-ST elevated myocardial infarction) Active Problems:   Aortic stenosis, severe, AVA0.4cm^2, LAst echo 01/2013   PAF (paroxysmal atrial fibrillation)   End stage renal disease   DM (diabetes mellitus)   HTN (hypertension)   HLD (hyperlipidemia)   COPD (chronic obstructive pulmonary disease)   Volume overload   Hypokalemia   Plan:  NSTEMI- coronary angiogram tomorrow with Dr. Tresa Endo.  Peak troponin here was 8.71.  Tending down.  Maintaining SR with PACs.  BP and HR stable.  No CP currently.  Nephrology following.     LOS: 1 day    HAGER, BRYAN 07/05/2013 11:14 AM  I have seen and evaluated the patient this PM along with Wilburt Finlay, PA. I agree with his findings, examination as well as impression recommendations.  Admitted with  NSTEMI - no further CP/Angina.  With Severe AS, ? If this is ACS from plaque rupture or AS related. Looks like in NSR/[place]  Plan is R&LHC tomorrow with Dr. Tresa Endo, per Pt Request. - Had HD today.  Stable BP & HR.  -- on BB H/H stable.  May need SSI adjustment if CBG continues to be elevated. On statin & ASA  Marykay Lex, M.D., M.S. [facility] [address] [place], [place]  [address]  [phone] Pager # [phone] 07/05/2013 7:37 PM

## 2013-07-05 NOTE — Progress Notes (Signed)
ANTICOAGULATION CONSULT NOTE - Follow Up Consult  Pharmacy Consult for Heparin Indication: atrial fibrillation and elevated troponin  Allergies  Allergen Reactions  . Codeine   . Sulfa Antibiotics    Patient Measurements: Height: 5\' 4"  (162.6 cm) Weight: 120 lb 13 oz (54.8 kg) IBW/kg (Calculated) : 54.7 Heparin Dosing Weight:  55 kg  Vital Signs: Temp: 97.6 F (36.4 C) (11/06 1505) Temp src: Oral (11/06 1505) BP: 118/43 mmHg (11/06 1505) Pulse Rate: 78 (11/06 1505)  Labs:  Recent Labs  07/04/13 1620 07/04/13 2123 07/05/13 0058 07/05/13 0545 07/05/13 0830 07/05/13 1815  HGB 11.2*  --   --  12.0  --   --   HCT 34.2*  --   --  36.9  --   --   PLT 139*  --   --  136*  --   --   HEPARINUNFRC  --   --  0.22*  --  0.30 0.35  CREATININE 3.25*  --   --  3.85*  --   --   TROPONINI  --  4.34* 8.71* 3.85*  --   --    Estimated Creatinine Clearance: 11.7 ml/min (by C-G formula based on Cr of 3.85).  Assessment: 86 YOF with a significant cardiac history who experienced chest pain and Afib with RVR during hemodialysis 11/5. +troponins. Heparin level at low end of goal on 750 units/hr.  Hgb stable, no bleeding noted.   PM:  Confirmatory heparin level within goal with rate increase.  Now at 0.35 and no noted bleeding complications.  Goal of Therapy:  Heparin level 0.3-0.7 units/ml Monitor platelets by anticoagulation protocol: Yes   Plan:   - Continue IV heparin to 800 units/hr to target mid-range goal.    - Heparin level and CBC daily while on heparin.  Nadara Mustard, PharmD., MS Clinical Pharmacist Pager:  [phone] Thank you for allowing pharmacy to be part of this patients care team. 07/05/2013 7:45 PM

## 2013-07-05 NOTE — Progress Notes (Signed)
Pt transferred to [facility] after report called to [place], Charity fundraiser. All pt belongings sent with pt at time of transfer. Pt family updated on transfer, no questions at this time. [facility] notified of transfer. Pt on 2L, VSS at time of transfer.   Delynn Flavin, RN

## 2013-07-05 NOTE — Clinical Documentation Improvement (Signed)
THIS DOCUMENT IS NOT A PERMANENT PART OF THE MEDICAL RECORD  Please update your documentation with the medical record to reflect your response to this query. If you need help knowing how to do this please call [phone].  07/05/13   Dear Dr. Kelly/Associates,  In a better effort to capture your patient's severity of illness, reflect appropriate length of stay and utilization of resources, a review of the patient medical record has revealed the following indicators.    Based on your clinical judgment, please clarify and document in a progress note and/or discharge summary the clinical condition associated with the following supporting information:  In responding to this query please exercise your independent judgment.  The fact that a query is asked, does not imply that any particular answer is desired or expected.   Please clarify and specify Diabetes type, control, manifestations, and associated conditions.  Possible Clinical Conditions?   "      Diabetes Type  1 or 2 "      Controlled or uncontrolled  Manifestations:  "      DM retinopathy  "      DM PVD "      DM neuropathy   "      DM nephropathy  "      Other Condition "      Cannot Clinically determine     Risk Factors: Diabetes Mellitus noted per 11/05 H&P.  Diagnostics: Lab:  07/04/13:  Hgb A1c:  8.5 Blood glucose:   262 Capillary glucose:  233   You may use possible, probable, or suspect with inpatient documentation. possible, probable, suspected diagnoses MUST be documented at the time of discharge  Reviewed:  No additional documentation provided.                                                                                       Thank You,  Marciano Sequin, Clinical Documentation Specialist: [phone] Health Information Management [place]

## 2013-07-05 NOTE — Progress Notes (Signed)
Spoke with patient about her diabetes.  States that she was diagnosed "a long time ago".  States that she takes 70/30 mixed insulin 40-50 units BID at home.  Is having a cardiac cath tomorrow.  Continues to be on Novolog SENSITIVE correction scale TID.  Will continue to follow blood sugars while in hospital.   Smith Mince RN BSN CDE

## 2013-07-05 NOTE — Progress Notes (Signed)
0100 Troponin level 8.71. MD paged at [phone]. Dr. Terressa Koyanagi returned call at [phone] and was notified of lab value. Will continue to monitor pt.

## 2013-07-05 NOTE — Progress Notes (Signed)
UR Completed.  [name] Jane [phone] 07/05/2013  

## 2013-07-06 ENCOUNTER — Encounter (HOSPITAL_COMMUNITY): Payer: Self-pay | Admitting: Cardiology

## 2013-07-06 ENCOUNTER — Ambulatory Visit (HOSPITAL_COMMUNITY): Admit: 2013-07-06 | Payer: Self-pay | Admitting: Cardiovascular Disease

## 2013-07-06 ENCOUNTER — Encounter (HOSPITAL_COMMUNITY)
Admission: EM | Disposition: A | Discharge status: Home / Self Care | Payer: Self-pay | Source: Other Acute Inpatient Hospital | Attending: Cardiovascular Disease

## 2013-07-06 ENCOUNTER — Encounter (HOSPITAL_COMMUNITY): Payer: Self-pay | Admitting: Pharmacy Technician

## 2013-07-06 DIAGNOSIS — I2510 Atherosclerotic heart disease of native coronary artery without angina pectoris: Secondary | ICD-10-CM

## 2013-07-06 HISTORY — PX: LEFT AND RIGHT HEART CATHETERIZATION WITH CORONARY ANGIOGRAM: SHX5449

## 2013-07-06 LAB — CBC
HCT: 34.3 % — ABNORMAL LOW (ref 36.0–46.0)
Hemoglobin: 11.3 g/dL — ABNORMAL LOW (ref 12.0–15.0)
MCH: 29.9 pg (ref 26.0–34.0)
MCHC: 32.9 g/dL (ref 30.0–36.0)
MCHC: 32.7 g/dL (ref 30.0–36.0)
Platelets: 142 10*3/uL — ABNORMAL LOW (ref 150–400)
Platelets: 142 10*3/uL — ABNORMAL LOW (ref 150–400)
RDW: 16.5 % — ABNORMAL HIGH (ref 11.5–15.5)
RDW: 16.4 % — ABNORMAL HIGH (ref 11.5–15.5)
WBC: 6.6 10*3/uL (ref 4.0–10.5)
WBC: 5.3 10*3/uL (ref 4.0–10.5)

## 2013-07-06 LAB — POCT I-STAT 3, ART BLOOD GAS (G3+)
Acid-Base Excess: 5 mmol/L — ABNORMAL HIGH (ref 0.0–2.0)
O2 Saturation: 95 %
TCO2: 33 mmol/L (ref 0–100)
pCO2 arterial: 53.9 mmHg — ABNORMAL HIGH (ref 35.0–45.0)
pH, Arterial: 7.373 (ref 7.350–7.450)

## 2013-07-06 LAB — CREATININE, SERUM
Creatinine, Ser: 2.75 mg/dL — ABNORMAL HIGH (ref 0.50–1.10)
GFR calc Af Amer: 19 mL/min — ABNORMAL LOW (ref >90)
GFR calc non Af Amer: 16 mL/min — ABNORMAL LOW (ref >90)

## 2013-07-06 LAB — GLUCOSE, CAPILLARY
Glucose-Capillary: 109 mg/dL — ABNORMAL HIGH (ref 70–99)
Glucose-Capillary: 92 mg/dL (ref 70–99)
Glucose-Capillary: 72 mg/dL (ref 70–99)

## 2013-07-06 LAB — POCT I-STAT 3, VENOUS BLOOD GAS (G3P V)
Bicarbonate: 32.5 mEq/L — ABNORMAL HIGH (ref 20.0–24.0)
pH, Ven: 7.345 — ABNORMAL HIGH (ref 7.250–7.300)
pO2, Ven: 39 mmHg (ref 30.0–45.0)

## 2013-07-06 LAB — POCT ACTIVATED CLOTTING TIME: Activated Clotting Time: 114 seconds

## 2013-07-06 SURGERY — LEFT AND RIGHT HEART CATHETERIZATION WITH CORONARY ANGIOGRAM

## 2013-07-06 MED ORDER — METOPROLOL TARTRATE 25 MG PO TABS
37.5000 mg | ORAL_TABLET | Freq: Two times a day (BID) | ORAL | Status: DC
  Administered 2013-07-06 – 2013-07-09 (×7): 37.5 mg via ORAL
  Filled 2013-07-06 (×9): qty 1

## 2013-07-06 MED ORDER — ACETAMINOPHEN 325 MG PO TABS: 650.0000 mg | ORAL_TABLET | ORAL | Status: DC | PRN

## 2013-07-06 MED ORDER — ONDANSETRON HCL 4 MG/2ML IJ SOLN: 4.0000 mg | Freq: Four times a day (QID) | INTRAMUSCULAR | Status: DC | PRN

## 2013-07-06 MED ORDER — FENTANYL CITRATE 0.05 MG/ML IJ SOLN
INTRAMUSCULAR | Status: AC
  Filled 2013-07-06: qty 2

## 2013-07-06 MED ORDER — DOCUSATE SODIUM 100 MG PO CAPS
100.0000 mg | ORAL_CAPSULE | ORAL | Status: DC | PRN
  Filled 2013-07-06: qty 1

## 2013-07-06 MED ORDER — ACETAMINOPHEN 500 MG PO TABS: 1000.0000 mg | ORAL_TABLET | Freq: Four times a day (QID) | ORAL | Status: DC | PRN

## 2013-07-06 MED ORDER — HEPARIN SODIUM (PORCINE) 5000 UNIT/ML IJ SOLN
5000.0000 [IU] | Freq: Three times a day (TID) | INTRAMUSCULAR | Status: DC
  Administered 2013-07-06 – 2013-07-12 (×15): 5000 [IU] via SUBCUTANEOUS
  Filled 2013-07-06 (×20): qty 1

## 2013-07-06 MED ORDER — DOCUSATE SODIUM 100 MG PO CAPS
100.0000 mg | ORAL_CAPSULE | Freq: Two times a day (BID) | ORAL | Status: DC | PRN
  Filled 2013-07-06: qty 1

## 2013-07-06 MED ORDER — LIDOCAINE HCL (PF) 1 % IJ SOLN
INTRAMUSCULAR | Status: AC
  Filled 2013-07-06: qty 30

## 2013-07-06 MED ORDER — MIDAZOLAM HCL 2 MG/2ML IJ SOLN
INTRAMUSCULAR | Status: AC
  Filled 2013-07-06: qty 2

## 2013-07-06 MED ORDER — ASPIRIN EC 81 MG PO TBEC
81.0000 mg | DELAYED_RELEASE_TABLET | Freq: Every day | ORAL | Status: DC
  Administered 2013-07-07 – 2013-07-11 (×5): 81 mg via ORAL
  Filled 2013-07-06 (×7): qty 1

## 2013-07-06 MED ORDER — CALCIUM ACETATE 667 MG PO CAPS
1334.0000 mg | ORAL_CAPSULE | Freq: Three times a day (TID) | ORAL | Status: DC
  Administered 2013-07-07 – 2013-07-12 (×12): 1334 mg via ORAL
  Filled 2013-07-06 (×19): qty 2

## 2013-07-06 MED ORDER — ASPIRIN EC 325 MG PO TBEC
325.0000 mg | DELAYED_RELEASE_TABLET | Freq: Every day | ORAL | Status: DC | PRN
  Filled 2013-07-06 (×2): qty 1

## 2013-07-06 MED ORDER — HEPARIN (PORCINE) IN NACL 2-0.9 UNIT/ML-% IJ SOLN
INTRAMUSCULAR | Status: AC
  Filled 2013-07-06: qty 1000

## 2013-07-06 MED ORDER — SODIUM CHLORIDE 0.9 % IV SOLN: INTRAVENOUS | Status: DC

## 2013-07-06 MED ORDER — DOXERCALCIFEROL 4 MCG/2ML IV SOLN
INTRAVENOUS | Status: AC
  Administered 2013-07-06: 2 ug via INTRAVENOUS
  Filled 2013-07-06: qty 2

## 2013-07-06 NOTE — Progress Notes (Signed)
Bleeding from fistula site post dialysis, pressure held for 15 minutes, dialysis nurse notified, now at bedside for observation and dressing change.

## 2013-07-06 NOTE — Progress Notes (Signed)
ANTICOAGULATION CONSULT NOTE - Follow Up Consult  Pharmacy Consult for Heparin Indication: atrial fibrillation and elevated troponin  Allergies  Allergen Reactions  . Sulfa Antibiotics Other (See Comments)    unknown  . Codeine Rash    Patient Measurements: Height: 5\' 4"  (162.6 cm) Weight: 113 lb 15.7 oz (51.7 kg) (standing weight) IBW/kg (Calculated) : 54.7 Heparin Dosing Weight: 51.7kg  Vital Signs: Temp: 97.8 F (36.6 C) (11/07 1058) Temp src: Oral (11/07 1058) BP: 150/47 mmHg (11/07 1130) Pulse Rate: 58 (11/07 1130)  Labs:  Recent Labs  07/04/13 1620 07/04/13 2123  07/05/13 0058 07/05/13 0545 07/05/13 0830 07/05/13 1815 07/06/13 0453  HGB 11.2*  --   --   --  12.0  --   --  11.3*  HCT 34.2*  --   --   --  36.9  --   --  34.3*  PLT 139*  --   --   --  136*  --   --  142*  HEPARINUNFRC  --   --   < > 0.22*  --  0.30 0.35 0.35  CREATININE 3.25*  --   --   --  3.85*  --   --   --   TROPONINI  --  4.34*  --  8.71* 3.85*  --   --   --   < > = values in this interval not displayed.  Estimated Creatinine Clearance: 11.1 ml/min (by C-G formula based on Cr of 3.85).   Medications:  Heparin @ 800 units/hr  Assessment: 72yof continues on heparin for new afib with elevated troponins. Heparin level is therapeutic. Hgb/Hct are stable, platelets are low but fairly stable. No bleeding reported. Plan for cath this evening.  Goal of Therapy:  Heparin level 0.3-0.7 units/ml Monitor platelets by anticoagulation protocol: Yes   Plan:  1) Continue heparin at 800 units/hr 2) Follow up after cath  Fredrik Rigger 07/06/2013,2:40 PM

## 2013-07-06 NOTE — Progress Notes (Signed)
Hypoglycemic Event  CBG: 62  Treatment: 15 GM carbohydrate snack  Symptoms: Hungry  Follow-up CBG: Time:1845 CBG Result:72  Possible Reasons for Event: Inadequate meal intake  Comments/MD notified:Protocol followed    Georgina Peer R  Remember to initiate Hypoglycemia Order Set & complete

## 2013-07-06 NOTE — Interval H&P Note (Signed)
History and Physical Interval Note:Cath Lab Visit (complete for each Cath Lab visit)  Clinical Evaluation Leading to the Procedure:   ACS: yes  Non-ACS:    Anginal Classification: CCS IV  Anti-ischemic medical therapy: Maximal Therapy (2 or more classes of medications)  Non-Invasive Test Results: No non-invasive testing performed  Prior CABG: Previous CABG        07/06/2013 4:48 PM  [name]  has presented today for surgery, with the diagnosis of cp  The various methods of treatment have been discussed with the patient and family. After consideration of risks, benefits and other options for treatment, the patient has consented to  Procedure(s): LEFT HEART CATHETERIZATION WITH CORONARY ANGIOGRAM (N/A) LEFT AND RIGHT HEART CATHETERIZATION WITH CORONARY ANGIOGRAM as a surgical intervention .  The patient's history has been reviewed, patient examined, no change in status, stable for surgery.  I have reviewed the patient's chart and labs.  Questions were answered to the patient's satisfaction.     KELLY,THOMAS A

## 2013-07-06 NOTE — CV Procedure (Signed)
[name] is a 72 y.o. female    [id number]  [id number] LOCATION:  FACILITY: [facility]  PHYSICIAN: Lennette Bihari, MD, [facility] 1941/02/20   DATE OF PROCEDURE:  07/06/2013     CARDIAC CATHETERIZATION     HISTORY:  [name] is a 72 year old African American female with end-stage renal disease who undergoes dialysis and [place] on Monday Wednesday and Fridays. She suffered a non-ST segment elevation MI in 2011 and underwent CABG surgery by Dr. Morton Peters with a LIMA to the LAD, vein to circumflex and vein to the PDA. At that time she did have mild aortic stenosis. Reportedly, an echo Doppler study several months ago suggested severe aortic stenosis. Recently, she had developed episodes of chest tightness during dialysis and also has noticed some shortness of breath. She was seen in the office and was scheduled to undergo right and left heart catheterization. This week, while in dialysis in [place] developed atrial fibrillation and chest pain. Troponin was positive at 10. She was transported to [facility].  She has undergone dialysis this morning. She now presents for right left heart catheterization.   PROCEDURE:  The patient was brought to the second floor [facility] cath lab in the postabsorptive state. She was premedicated with Versed 2 mg and fentanyl 25 mcg intravenously. Her right groin was prepped and shaved in usual sterile fashion. Xylocaine 1% was used for local anesthesia. A 6 French arterial and 7 French venous sheath were inserted. Right heart catheterization was performed and hemodynamic recording was obtained in the right atrium, right ventricle, pulmonary artery and pulmonary capillary wedge positions. Thermodilution cardiac output was obtained. Oxygen saturation was obtained in the pulmonary artery. A 5 French pigtail catheter was inserted into the central aorta and central aorta oxygen saturation was obtained. Simultaneous aorta and pulmonary artery pressures were  recorded. Initial attempt at passing the pigtail catheter across the aortic stenosis was unsuccessful. Supravalvular aortography was performed in the LAO projection. Attention was then directed at the coronary arteries and grafts. A 5 [place] FL4 catheter was inserted and diagnostic angiography was done in the left coronary system. A 5 [place]  FR4 catheter for selective angiography into the native right coronary artery. This was also used for selective angiography into both vein grafts which were occluded at the ostium. A left bypass graft catheter was also inserted for improved visualization of the nubbin.A LIMA catheter was used for selective angiography in the left internal mammary artery. A straight wire was then inserted and was able to cross the aortic valve into the left ventricle and the LIMA catheter was exchanged for a pigtail catheter. Left ventricular pressures were recorded and simultaneous LV and PC pressures were recorded. Left ventriculography was performed in the RAO projection. A careful LV to AO pullback was performed. Hemostasis was obtained by direct finger pressure. The patient tolerated the procedure well.   HEMODYNAMICS:   RA: mean 11 RV: 74/6/14 PA: 74/19  Mean 38 Pc: 17  Pullback: LV: 152/13/20 Ao: 120.41  CO: Thermo 3.6l/min;  Fick 5.3 CI:                  2.3l/mi/m2;     3.6  AVA: 1.1 cm   Oxygen Saturation:  PA: 69%                                   Ao: 95%  ANGIOGRAPHY:  Fluoroscopy reveals  severe coronary calcification as well as significant calcification of the aortic valve.  Supravalvular aortography revealed markedly reduced aortic valve excursion with trivial aortic insufficiency.  1. Left main: Normal large vessel which bifurcated into an LAD and left circumflex vessel.  2. LAD: Calcified vessel with 60% proximal stenosis in the region of the diagonal takeoff with an 80% stenosis in the small diagonal vessel. The LAD extended to the LV apex. 3. Left  circumflex: Large proximal vessel. The first marginal branch was occluded in the proximal segment. Minimal thinning of filling was seen beyond this. The vessel is extensively calcified beyond the occlusion. 4. Right coronary artery: Severely calcified vessel with 40% proximal stenosis followed by 90% calcified stenosis in the midsegment. The region of the acute margin there was 95% very calcified stenosis. The distal continuation branch RCA was small caliber and has diffuse tapering of 60% 5.LIMA TO LAD: Patent but do competitive filling via the native LAD visualization of the LAD beyond the LIMA was not brisk flow 6. SVG TO circumflex marginal vessel was totally occluded at the ostium 7.   SVG TO PDA branch and right coronary artery was totally occluded at the ostium    8.  Left ventriculography: Revealed left ventricle hypertrophy with preserved LV contractility with an ejection fraction of approximately 55%. There was 2+ angiographic mitral regurgitation.   IMPRESSION:  Left ventriclular hypertrophy with preserved LV function with 2+ angiographic mitral regurgitation Severe pulmonary hypertension. Moderate aortic valve stenosis with a peak to peak gradient of 32 mm and a calculated aortic valve area of 1.1 cm. Severely calcified coronary arteries with 60% proximal LAD stenosis, 80% diagonal stenosis; total occlusion of a large calcified obtuse marginal branch of the left circumflex coronary artery; and diffusely diseased severely calcified right coronary artery with 40% proximal 90% mid and 95% stenosis in the region of the acute margin with diffuse 60% narrowing beyond the PDA takeoff. Patent LIMA to the LAD but without brisk flow distally due to competitive filling from the native LAD injection. Occluded vein graft which supplied the obtuse marginal branch of the circumflex. Occluded vein graft that supplied the PDA.  RECOMMENDATION:  Angiographic films will be reviewed with colleagues.  The patient has occlusion of both vein grafts and has a patent LIMA to the LAD. Aortic stenosis is moderate to moderately severe with markedly reduced aortic valve excursion of the valve area of 1.1 cm. If percutaneous revascularization is to be considered high-speed rotational atherectomy of the diffusely diseased RCA will need to be performed. The LIMA is patent. The proximal LAD stenosis 60%. The left circumflex vessel is not bypassable.  Lennette Bihari, MD, [facility] 07/06/2013 6:28 PM

## 2013-07-06 NOTE — H&P (View-Only) (Signed)
Subjective: Napping comfortably since dialysis, no current pain   Objective: Vital signs in last 24 hours: Temp:  [97.5 F (36.4 C)-98.6 F (37 C)] 97.8 F (36.6 C) (11/07 1058) Pulse Rate:  [48-78] 58 (11/07 1130) Resp:  [17-26] 20 (11/07 1130) BP: (113-189)/(32-95) 150/47 mmHg (11/07 1130) SpO2:  [95 %-100 %] 100 % (11/07 1130) Weight:  [113 lb 15.7 oz (51.7 kg)-124 lb 5.4 oz (56.4 kg)] 113 lb 15.7 oz (51.7 kg) (11/07 1058) Weight change:  Last BM Date: 06/30/13 Intake/Output from previous day: +1026.9  Wt 113lb 15 oz post dialysis, from 124 today 11/06 0701 - 11/07 0700 In: 1032.4 [P.O.:840; I.V.:192.4] Out: 5 [Urine:5] Intake/Output this shift: Total I/O In: 40 [I.V.:40] Out: 3400 [Other:3400]  PE: General:Pleasant affect, NAD Skin:Warm and dry, brisk capillary refill HEENT:normocephalic, sclera clear, mucus membranes moist Neck:supple, no JVD, no bruits  Heart:S1S2 RRR with loud 3-4/6 harsh aortic murmur, gallup, rub or click Lungs:clear ant. without rales, rhonchi, or wheezes NFA:OZHY, non tender, + BS, do not palpate liver spleen or masses Ext:no lower ext edema Neuro:alert and oriented, MAE, follows commands, + facial symmetry   Lab Results:  Recent Labs  07/05/13 0545 07/06/13 0453  WBC 6.7 6.6  HGB 12.0 11.3*  HCT 36.9 34.3*  PLT 136* 142*   BMET  Recent Labs  07/04/13 1620 07/05/13 0545  NA 137 138  K 3.2* 3.4*  CL 94* 95*  CO2 29 23  GLUCOSE 262* 145*  BUN 20 28*  CREATININE 3.25* 3.85*  CALCIUM 8.9 9.6    Recent Labs  07/05/13 0058 07/05/13 0545  TROPONINI 8.71* 3.85*    Lab Results  Component Value Date   CHOL 152 07/05/2013   HDL 99 07/05/2013   LDLCALC 44 07/05/2013   TRIG 47 07/05/2013   CHOLHDL 1.5 07/05/2013   Lab Results  Component Value Date   HGBA1C 8.5* 07/04/2013     Lab Results  Component Value Date   TSH 1.251 07/04/2013    Hepatic Function Panel  Recent Labs  07/04/13 1620  PROT 6.7  ALBUMIN  3.5  AST 41*  ALT 34  ALKPHOS 183*  BILITOT 0.2*    Recent Labs  07/05/13 0545  CHOL 152   No results found for this basename: PROTIME,  in the last 72 hours     Studies/Results: No results found.  Medications: I have reviewed the patient's current medications. Scheduled Meds: . aspirin EC  81 mg Oral Daily  . atorvastatin  40 mg Oral q1800  . calcium acetate  667 mg Oral TID WC  . doxercalciferol  2 mcg Intravenous Q M,W,F-HD  . insulin aspart  0-9 Units Subcutaneous TID WC  . metoprolol tartrate  25 mg Oral BID  . multivitamin  1 tablet Oral QHS  . sodium chloride  3 mL Intravenous Q12H   Continuous Infusions: . sodium chloride 10 mL/hr at 07/06/13 0542  . heparin 800 Units/hr (07/05/13 1400)   PRN Meds:.sodium chloride, acetaminophen, docusate sodium, nitroGLYCERIN, ondansetron (ZOFRAN) IV, sodium chloride  Assessment/Plan: Principal Problem:   NSTEMI (non-ST elevated myocardial infarction) Active Problems:   End stage renal disease   DM (diabetes mellitus)   HTN (hypertension)   HLD (hyperlipidemia)   COPD (chronic obstructive pulmonary disease)   Aortic stenosis, severe, AVA0.4cm^2, LAst echo 01/2013   PAF (paroxysmal atrial fibrillation)   Volume overload   CAD (coronary artery disease), NSTEMI 2011, CABG X 3 09/2009  PLAN: Maintaining  SR, no complaints currently, for R&LHC this pm.  Had dialysis this am.   LOS: 2 days   Time spent with pt. : 15 minutes. [facility] R  Nurse Practitioner Certified Pager [phone] 07/06/2013, 1:52 PM   Patient seen and examined. Agree with assessment and plan. No recurrent chest pain. Had oozing at dialysis graft site; now stable . Discussed cath procedure, R and L heart cath , AS and grafts. Plan shortly.   Lennette Bihari, MD, [facility] 07/06/2013 4:03 PM

## 2013-07-06 NOTE — Procedures (Signed)
I was present at this dialysis session, have reviewed the session itself and made  appropriate changes   Vinson Moselle MD  pager [phone]    cell [phone]  07/06/2013, 9:49 AM  d

## 2013-07-06 NOTE — Progress Notes (Signed)
  [facility] Progress Note    Subjective: on dialysis, no complaints   Exam  Blood pressure 155/38, pulse 60, temperature 97.5 F (36.4 C), temperature source Oral, resp. rate 21, height 5\' 4"  (1.626 m), weight 56.4 kg (124 lb 5.4 oz), SpO2 100.00%. Gen: alert, no distress Neck: supple  Heart: RRR/ 3/6 hsm, no rub, no gallop  Lungs: occ coarse crackles lower lung fields bilat Abd: bs pos. , soft, nontender  Ext: bilat pretibial 1+ edema Neuro: ox3, NF Access: LUA AVF patent   Dialysis Orders: Center: [facility] MWF .  53kg    2K/2.25Ca   LUA AVF Hectorol 2ug    Epogen1600    Venofer none Recent labs 06/27/13 hgb 11.9 tfs= 31 % Ferritin 1537, ipth 316 ca 9.9 phos 4.6   Assessment/Plan  1. NSTEMI- for cath later today 2. Severe AS- cath today 3. Afib- stable now 4. ESRD MWF- HD today 5. Hypertension/volume- BP up, LE edema > max UF w hd today 6. 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 DM = diet controlled    Vinson Moselle MD  pager [phone]    cell [phone]  07/06/2013, 10:16 AM   Recent Labs Lab 07/04/13 1620 07/05/13 0545  NA 137 138  K 3.2* 3.4*  CL 94* 95*  CO2 29 23  GLUCOSE 262* 145*  BUN 20 28*  CREATININE 3.25* 3.85*  CALCIUM 8.9 9.6  PHOS  --  5.6*    Recent Labs Lab 07/04/13 1620  AST 41*  ALT 34  ALKPHOS 183*  BILITOT 0.2*  PROT 6.7  ALBUMIN 3.5    Recent Labs Lab 07/04/13 1620 07/05/13 0545 07/06/13 0453  WBC 5.6 6.7 6.6  NEUTROABS 3.3  --   --   HGB 11.2* 12.0 11.3*  HCT 34.2* 36.9 34.3*  MCV 91.2 92.5 90.7  PLT 139* 136* 142*   . aspirin EC  81 mg Oral Daily  . atorvastatin  40 mg Oral q1800  . calcium acetate  667 mg Oral TID WC  . doxercalciferol  2 mcg Intravenous Q M,W,F-HD  . insulin aspart  0-9 Units Subcutaneous TID WC  . metoprolol tartrate  25 mg Oral BID  . multivitamin  1 tablet Oral QHS  . sodium chloride  3 mL Intravenous Q12H   .  sodium chloride 10 mL/hr at 07/06/13 0542  . heparin 800 Units/hr (07/05/13 1400)   sodium chloride, sodium chloride, sodium chloride, acetaminophen, feeding supplement (NEPRO CARB STEADY), heparin, lidocaine (PF), lidocaine-prilocaine, nitroGLYCERIN, ondansetron (ZOFRAN) IV, pentafluoroprop-tetrafluoroeth, sodium chloride

## 2013-07-06 NOTE — Progress Notes (Signed)
       Subjective: Napping comfortably since dialysis, no current pain   Objective: Vital signs in last 24 hours: Temp:  [97.5 F (36.4 C)-98.6 F (37 C)] 97.8 F (36.6 C) (11/07 1058) Pulse Rate:  [48-78] 58 (11/07 1130) Resp:  [17-26] 20 (11/07 1130) BP: (113-189)/(32-95) 150/47 mmHg (11/07 1130) SpO2:  [95 %-100 %] 100 % (11/07 1130) Weight:  [113 lb 15.7 oz (51.7 kg)-124 lb 5.4 oz (56.4 kg)] 113 lb 15.7 oz (51.7 kg) (11/07 1058) Weight change:  Last BM Date: 06/30/13 Intake/Output from previous day: +1026.9  Wt 113lb 15 oz post dialysis, from 124 today 11/06 0701 - 11/07 0700 In: 1032.4 [P.O.:840; I.V.:192.4] Out: 5 [Urine:5] Intake/Output this shift: Total I/O In: 40 [I.V.:40] Out: 3400 [Other:3400]  PE: General:Pleasant affect, NAD Skin:Warm and dry, brisk capillary refill HEENT:normocephalic, sclera clear, mucus membranes moist Neck:supple, no JVD, no bruits  Heart:S1S2 RRR with loud 3-4/6 harsh aortic murmur, gallup, rub or click Lungs:clear ant. without rales, rhonchi, or wheezes Abd:soft, non tender, + BS, do not palpate liver spleen or masses Ext:no lower ext edema Neuro:alert and oriented, MAE, follows commands, + facial symmetry   Lab Results:  Recent Labs  07/05/13 0545 07/06/13 0453  WBC 6.7 6.6  HGB 12.0 11.3*  HCT 36.9 34.3*  PLT 136* 142*   BMET  Recent Labs  07/04/13 1620 07/05/13 0545  NA 137 138  K 3.2* 3.4*  CL 94* 95*  CO2 29 23  GLUCOSE 262* 145*  BUN 20 28*  CREATININE 3.25* 3.85*  CALCIUM 8.9 9.6    Recent Labs  07/05/13 0058 07/05/13 0545  TROPONINI 8.71* 3.85*    Lab Results  Component Value Date   CHOL 152 07/05/2013   HDL 99 07/05/2013   LDLCALC 44 07/05/2013   TRIG 47 07/05/2013   CHOLHDL 1.5 07/05/2013   Lab Results  Component Value Date   HGBA1C 8.5* 07/04/2013     Lab Results  Component Value Date   TSH 1.251 07/04/2013    Hepatic Function Panel  Recent Labs  07/04/13 1620  PROT 6.7  ALBUMIN  3.5  AST 41*  ALT 34  ALKPHOS 183*  BILITOT 0.2*    Recent Labs  07/05/13 0545  CHOL 152   No results found for this basename: PROTIME,  in the last 72 hours     Studies/Results: No results found.  Medications: I have reviewed the patient's current medications. Scheduled Meds: . aspirin EC  81 mg Oral Daily  . atorvastatin  40 mg Oral q1800  . calcium acetate  667 mg Oral TID WC  . doxercalciferol  2 mcg Intravenous Q M,W,F-HD  . insulin aspart  0-9 Units Subcutaneous TID WC  . metoprolol tartrate  25 mg Oral BID  . multivitamin  1 tablet Oral QHS  . sodium chloride  3 mL Intravenous Q12H   Continuous Infusions: . sodium chloride 10 mL/hr at 07/06/13 0542  . heparin 800 Units/hr (07/05/13 1400)   PRN Meds:.sodium chloride, acetaminophen, docusate sodium, nitroGLYCERIN, ondansetron (ZOFRAN) IV, sodium chloride  Assessment/Plan: Principal Problem:   NSTEMI (non-ST elevated myocardial infarction) Active Problems:   End stage renal disease   DM (diabetes mellitus)   HTN (hypertension)   HLD (hyperlipidemia)   COPD (chronic obstructive pulmonary disease)   Aortic stenosis, severe, AVA0.4cm^2, LAst echo 01/2013   PAF (paroxysmal atrial fibrillation)   Volume overload   CAD (coronary artery disease), NSTEMI 2011, CABG X 3 09/2009  PLAN: Maintaining   SR, no complaints currently, for R&LHC this pm.  Had dialysis this am.   LOS: 2 days   Time spent with pt. : 15 minutes. INGOLD,LAURA R  Nurse Practitioner Certified Pager [phone] 07/06/2013, 1:52 PM   Patient seen and examined. Agree with assessment and plan. No recurrent chest pain. Had oozing at dialysis graft site; now stable . Discussed cath procedure, R and L heart cath , AS and grafts. Plan shortly.   Zacharias Ridling A. Rether Rison, MD, FACC 07/06/2013 4:03 PM  

## 2013-07-07 DIAGNOSIS — I359 Nonrheumatic aortic valve disorder, unspecified: Secondary | ICD-10-CM

## 2013-07-07 LAB — GLUCOSE, CAPILLARY: Glucose-Capillary: 219 mg/dL — ABNORMAL HIGH (ref 70–99)

## 2013-07-07 LAB — CBC
HCT: 33 % — ABNORMAL LOW (ref 36.0–46.0)
Hemoglobin: 10.6 g/dL — ABNORMAL LOW (ref 12.0–15.0)
MCH: 29.3 pg (ref 26.0–34.0)
MCHC: 32.1 g/dL (ref 30.0–36.0)
Platelets: 144 10*3/uL — ABNORMAL LOW (ref 150–400)
RDW: 16.5 % — ABNORMAL HIGH (ref 11.5–15.5)
WBC: 6.3 10*3/uL (ref 4.0–10.5)

## 2013-07-07 MED ORDER — SENNA 8.6 MG PO TABS
1.0000 | ORAL_TABLET | Freq: Every day | ORAL | Status: DC
  Administered 2013-07-07 – 2013-07-10 (×3): 8.6 mg via ORAL
  Filled 2013-07-07 (×6): qty 1

## 2013-07-07 MED ORDER — AMLODIPINE BESYLATE 2.5 MG PO TABS
2.5000 mg | ORAL_TABLET | Freq: Every day | ORAL | Status: DC
  Administered 2013-07-07 – 2013-07-12 (×6): 2.5 mg via ORAL
  Filled 2013-07-07 (×6): qty 1

## 2013-07-07 NOTE — Progress Notes (Signed)
  [facility] Progress Note    Subjective: Feeling OK   Exam  Blood pressure 122/69, pulse 53, temperature 97.7 F (36.5 C), temperature source Oral, resp. rate 20, height 5\' 4"  (1.626 m), weight 54 kg (119 lb 0.8 oz), SpO2 91.00%. Gen: alert, no distress Neck: supple  Heart: RRR/ 3/6 hsm, no rub, no gallop  Lungs: clear bilat Abd: bs pos. , soft, nontender  Ext: bilat pretibial 1+ edema Neuro: ox3, NF Access: LUA AVF patent   Dialysis Orders: Center: [facility] MWF .  53kg    2K/2.25Ca   LUA AVF Hectorol 2ug    Epogen 1600    Venofer none Recent labs 06/27/13 hgb 11.9 tfs= 31 % Ferritin 1537, ipth 316 ca 9.9 phos 4.6   Assessment/Plan  1. NSTEMI, s/p cath- occluded grafts x 2, LIMA open, possible PCI to native RCA; per cardiology 2. Severe AS, see cath notes 3. Afib- stable now 4. ESRD MWF HD 5. HTN/volume- cont to lower volume, lower dry wt at d/c still vol excess and BP up 6. 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 DM = diet controlled    Vinson Moselle MD  pager [phone]    cell [phone]  07/07/2013, 11:52 AM   Recent Labs Lab 07/04/13 1620 07/05/13 0545 07/06/13 2027  NA 137 138  --   K 3.2* 3.4*  --   CL 94* 95*  --   CO2 29 23  --   GLUCOSE 262* 145*  --   BUN 20 28*  --   CREATININE 3.25* 3.85* 2.75*  CALCIUM 8.9 9.6  --   PHOS  --  5.6*  --     Recent Labs Lab 07/04/13 1620  AST 41*  ALT 34  ALKPHOS 183*  BILITOT 0.2*  PROT 6.7  ALBUMIN 3.5    Recent Labs Lab 07/04/13 1620  07/06/13 0453 07/06/13 2027 07/07/13 0450  WBC 5.6  < > 6.6 5.3 6.3  NEUTROABS 3.3  --   --   --   --   HGB 11.2*  < > 11.3* 11.3* 10.6*  HCT 34.2*  < > 34.3* 34.6* 33.0*  MCV 91.2  < > 90.7 92.5 91.2  PLT 139*  < > 142* 142* 144*  < > = values in this interval not displayed. Marland Kitchen amLODipine  2.5 mg Oral Daily  . aspirin EC  81 mg Oral Daily  . atorvastatin  40 mg Oral q1800  . calcium  acetate  1,334 mg Oral TID WC  . doxercalciferol  2 mcg Intravenous Q M,W,F-HD  . heparin  5,000 Units Subcutaneous Q8H  . insulin aspart  0-9 Units Subcutaneous TID WC  . metoprolol tartrate  37.5 mg Oral BID  . multivitamin  1 tablet Oral QHS  . senna  1 tablet Oral Daily     acetaminophen, aspirin EC, docusate sodium, nitroGLYCERIN, ondansetron (ZOFRAN) IV

## 2013-07-07 NOTE — Progress Notes (Addendum)
Subjective:  No chest pain or sob  Objective:   Vital Signs in the last 24 hours: Temp:  [97.8 F (36.6 C)-98.8 F (37.1 C)] 97.9 F (36.6 C) (11/08 0500) Pulse Rate:  [56-69] 58 (11/07 2300) Resp:  [16-23] 20 (11/07 2300) BP: (105-197)/(35-135) 105/71 mmHg (11/07 2300) SpO2:  [95 %-100 %] 100 % (11/07 2300) Weight:  [113 lb 15.7 oz (51.7 kg)-119 lb 0.8 oz (54 kg)] 119 lb 0.8 oz (54 kg) (11/08 0500)  Intake/Output from previous day: 11/07 0701 - 11/08 0700 In: 856 [P.O.:240; I.V.:616] Out: 3400   Scheduled: . aspirin EC  81 mg Oral Daily  . atorvastatin  40 mg Oral q1800  . calcium acetate  1,334 mg Oral TID WC  . doxercalciferol  2 mcg Intravenous Q M,W,F-HD  . heparin  5,000 Units Subcutaneous Q8H  . insulin aspart  0-9 Units Subcutaneous TID WC  . metoprolol tartrate  37.5 mg Oral BID  . multivitamin  1 tablet Oral QHS   . sodium chloride     Physical Exam:   General appearance: alert, cooperative and no distress Neck: no adenopathy, no JVD and supple, symmetrical, trachea midline Lungs: clear to auscultation bilaterally Heart: regular rate and rhythm and 2/6 sem Abdomen: soft, non-tender; bowel sounds normal; no masses,  no organomegaly Cath site stable Extremities: no edema, redness or tenderness in the calves or thighs Skin: Skin color, texture, turgor normal. No rashes or lesions   Rate: 58  Rhythm: normal sinus rhythm  Lab Results:    Recent Labs  07/04/13 1620 07/05/13 0545 07/06/13 2027  NA 137 138  --   K 3.2* 3.4*  --   CL 94* 95*  --   CO2 29 23  --   GLUCOSE 262* 145*  --   BUN 20 28*  --   CREATININE 3.25* 3.85* 2.75*    Recent Labs  07/05/13 0058 07/05/13 0545  TROPONINI 8.71* 3.85*   Hepatic Function Panel  Recent Labs  07/04/13 1620  PROT 6.7  ALBUMIN 3.5  AST 41*  ALT 34  ALKPHOS 183*  BILITOT 0.2*   No results found for this basename: INR,  in the last 72 hours BNP (last 3 results) No results found for this  basename: PROBNP,  in the last 8760 hours  Lipid Panel     Component Value Date/Time   CHOL 152 07/05/2013 0545   TRIG 47 07/05/2013 0545   HDL 99 07/05/2013 0545   CHOLHDL 1.5 07/05/2013 0545   VLDL 9 07/05/2013 0545   LDLCALC 44 07/05/2013 0545      Imaging:  No results found.    Assessment/Plan:   Principal Problem:   NSTEMI (non-ST elevated myocardial infarction) Active Problems:   End stage renal disease   DM (diabetes mellitus)   HTN (hypertension)   HLD (hyperlipidemia)   COPD (chronic obstructive pulmonary disease)   Aortic stenosis, severe, AVA0.4cm^2, LAst echo 01/2013   PAF (paroxysmal atrial fibrillation)   Volume overload   CAD (coronary artery disease), NSTEMI 2011, CABG X 3 09/2009   I reviewed cath findings with patient. There is discordance of AVA data c/ w old echo.  Will re check echo today to visually look at AV and MV. Discussed possible HSRA of very calcified RCA if no need for surgery. Will titrate med therapy.  Now off  Iv heparin, but on sq.  HR now in 50's on increased BB.  Will start low dose amlodipine.  Lennette Bihari, MD, [facility] 07/07/2013, 8:39 AM

## 2013-07-07 NOTE — Progress Notes (Signed)
Echocardiogram 2D Echocardiogram has been performed.  [name] 07/07/2013, 1:45 PM

## 2013-07-08 ENCOUNTER — Encounter (HOSPITAL_COMMUNITY): Payer: Self-pay | Admitting: *Deleted

## 2013-07-08 DIAGNOSIS — I4891 Unspecified atrial fibrillation: Secondary | ICD-10-CM

## 2013-07-08 LAB — GLUCOSE, CAPILLARY
Glucose-Capillary: 136 mg/dL — ABNORMAL HIGH (ref 70–99)
Glucose-Capillary: 91 mg/dL (ref 70–99)
Glucose-Capillary: 116 mg/dL — ABNORMAL HIGH (ref 70–99)

## 2013-07-08 NOTE — Progress Notes (Signed)
Subjective:  No chest pain or sob at rest.   Objective:   Vital Signs in the last 24 hours: Temp:  [97.7 F (36.5 C)-98.5 F (36.9 C)] 98.5 F (36.9 C) (11/09 0406) Pulse Rate:  [54-56] 54 (11/08 2000) Resp:  [14] 14 (11/08 1631) BP: (170-177)/(53-56) 177/53 mmHg (11/08 2000) SpO2:  [93 %-97 %] 93 % (11/09 0406)  Intake/Output from previous day: 11/08 0701 - 11/09 0700 In: 750 [P.O.:600; I.V.:150] Out: 1 [Stool:1]  Medications: . amLODipine  2.5 mg Oral Daily  . aspirin EC  81 mg Oral Daily  . atorvastatin  40 mg Oral q1800  . calcium acetate  1,334 mg Oral TID WC  . doxercalciferol  2 mcg Intravenous Q M,W,F-HD  . heparin  5,000 Units Subcutaneous Q8H  . insulin aspart  0-9 Units Subcutaneous TID WC  . metoprolol tartrate  37.5 mg Oral BID  . multivitamin  1 tablet Oral QHS  . senna  1 tablet Oral Daily      Physical Exam:   General appearance: alert, cooperative and no distress Neck: no adenopathy, no JVD and supple, symmetrical, trachea midline Lungs: clear to auscultation bilaterally Heart: regular rate and rhythm and 2-3/6 AS murmur Abdomen: soft, non-tender; bowel sounds normal; no masses,  no organomegaly Extremities: no edema, redness or tenderness in the calves or thighs   Rate: 58  Rhythm: sinus  Lab Results:    Recent Labs  07/06/13 2027  CREATININE 2.75*   No results found for this basename: TROPONINI, CK, MB,  in the last 72 hours Hepatic Function Panel No results found for this basename: PROT, ALBUMIN, AST, ALT, ALKPHOS, BILITOT, BILIDIR, IBILI,  in the last 72 hours No results found for this basename: INR,  in the last 72 hours BNP (last 3 results) No results found for this basename: PROBNP,  in the last 8760 hours  Lipid Panel     Component Value Date/Time   CHOL 152 07/05/2013 0545   TRIG 47 07/05/2013 0545   HDL 99 07/05/2013 0545   CHOLHDL 1.5 07/05/2013 0545   VLDL 9 07/05/2013 0545   LDLCALC 44 07/05/2013 0545       Imaging:  2-d Echo Study Conclusions (07/07/2013)  - Left ventricle: The cavity size was normal. Wall thickness was increased in a pattern of mild LVH. There was moderate concentric hypertrophy. Systolic function was mildly reduced. The estimated ejection fraction was in the range of 45% to 50%. Probable moderate hypokinesis of the basal-midinferolateral myocardium. Doppler parameters are consistent with a reversible restrictive pattern, indicative of decreased left ventricular diastolic compliance and/or increased left atrial pressure (grade 3 diastolic dysfunction). Doppler parameters are consistent with both elevated ventricular end-diastolic filling pressure and elevated left atrial filling pressure. - Ventricular septum: The contour showed diastolic flattening and systolic flattening. - Aortic valve: Moderately calcified annulus. Trileaflet; severely thickened, severely calcified leaflets. There was severe stenosis. Valve area: 0.55cm^2 (Vmax). - Mitral valve: Moderately calcified, mildly thickened annulus. Mild regurgitation. - Left atrium: The atrium was moderately dilated. - Right ventricle: The cavity size was mildly dilated. Systolic function was moderately reduced. Systolic pressure was increased. - Right atrium: The atrium was moderately dilated. - Tricuspid valve: Moderate-severe regurgitation. Doppler measurements were not obtained to assess the severity of pulmonary hypertension (at cath this was severe).    Assessment/Plan:   Principal Problem:   NSTEMI (non-ST elevated myocardial infarction) Active Problems:   End stage renal disease   DM (diabetes mellitus)   HTN (  hypertension)   HLD (hyperlipidemia)   COPD (chronic obstructive pulmonary disease)   Aortic stenosis, severe, AVA0.4cm^2, LAst echo 01/2013   PAF (paroxysmal atrial fibrillation)   Volume overload   CAD (coronary artery disease), NSTEMI 2011, CABG X 3 09/2009   I have reviewed echo  from yesterday. At cath there was markedly reduced AV excursion but 32 mm gradient on pullback with Ao pressure 120, although initial Ao pressure was only 100. Echo again suggests severe AS, with moderately severe TR. PA pressure at cath was 74 mm Hg. Dr Morton Peters did her initial CABG in 09/2009. Restrictive physiology with wall motion abnormality in Lcx territory. Will ask him to review. ? AVR with re-do CABG although LIMA is patent vs TAVR for AVR and HSRA to severely calcified RCA. Discussed with patient who agrees for re-evaluation.     Lennette Bihari, MD, [facility] 07/08/2013, 9:17 AM

## 2013-07-08 NOTE — Progress Notes (Signed)
  [facility] Progress Note    Subjective: Feeling OK, says may have another procedure on Wed, low SaO2 off of oxygen in mid 80's, no complaints per pt   Exam  Blood pressure 177/53, pulse 54, temperature 98.5 F (36.9 C), temperature source Oral, resp. rate 14, height 5\' 4"  (1.626 m), weight 54 kg (119 lb 0.8 oz), SpO2 93.00%. Gen: alert, no distress Neck: supple  Heart: RRR/ 3/6 hsm, no rub, no gallop  Lungs: faint rales at bases Abd: bs pos. , soft, nontender  Ext: bilat pretibial 1+ edema Neuro: ox3, NF Access: LUA AVF patent   Dialysis Orders: Center: [facility] MWF .  3.5hrs  53kg   F160    2K/2.25Ca   Prof 4   Heparin none LUA AVF Hectorol 2ug    Epogen none   Venofer none Recent labs 06/27/13 hgb 11.9 tfs= 31 % Ferritin 1537, ipth 316 ca 9.9 phos 4.6   Assessment/Plan  1. NSTEMI, s/p cath- occluded grafts x 2, LIMA open, possible PCI this week to native vessel 2. Aortic stenosis- mod severe by cath 3. Low SaO2 on RA- check cxr 4. Afib- in nsr, on bb 5. ESRD MWF HD 6. HTN/volume- UF to 51.5kg w HD Monday, lower edw, cont norvasc/BB 7. 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 DM = diet controlled    Vinson Moselle MD  pager [phone]    cell [phone]  07/08/2013, 8:27 AM   Recent Labs Lab 07/04/13 1620 07/05/13 0545 07/06/13 2027  NA 137 138  --   K 3.2* 3.4*  --   CL 94* 95*  --   CO2 29 23  --   GLUCOSE 262* 145*  --   BUN 20 28*  --   CREATININE 3.25* 3.85* 2.75*  CALCIUM 8.9 9.6  --   PHOS  --  5.6*  --     Recent Labs Lab 07/04/13 1620  AST 41*  ALT 34  ALKPHOS 183*  BILITOT 0.2*  PROT 6.7  ALBUMIN 3.5    Recent Labs Lab 07/04/13 1620  07/06/13 0453 07/06/13 2027 07/07/13 0450  WBC 5.6  < > 6.6 5.3 6.3  NEUTROABS 3.3  --   --   --   --   HGB 11.2*  < > 11.3* 11.3* 10.6*  HCT 34.2*  < > 34.3* 34.6* 33.0*  MCV 91.2  < > 90.7 92.5 91.2  PLT 139*  < > 142* 142* 144*  < > = values in this interval not  displayed. Marland Kitchen amLODipine  2.5 mg Oral Daily  . aspirin EC  81 mg Oral Daily  . atorvastatin  40 mg Oral q1800  . calcium acetate  1,334 mg Oral TID WC  . doxercalciferol  2 mcg Intravenous Q M,W,F-HD  . heparin  5,000 Units Subcutaneous Q8H  . insulin aspart  0-9 Units Subcutaneous TID WC  . metoprolol tartrate  37.5 mg Oral BID  . multivitamin  1 tablet Oral QHS  . senna  1 tablet Oral Daily     acetaminophen, aspirin EC, docusate sodium, nitroGLYCERIN, ondansetron (ZOFRAN) IV

## 2013-07-09 ENCOUNTER — Other Ambulatory Visit: Payer: Self-pay | Admitting: *Deleted

## 2013-07-09 DIAGNOSIS — I2510 Atherosclerotic heart disease of native coronary artery without angina pectoris: Secondary | ICD-10-CM

## 2013-07-09 DIAGNOSIS — E8779 Other fluid overload: Secondary | ICD-10-CM

## 2013-07-09 DIAGNOSIS — Z01810 Encounter for preprocedural cardiovascular examination: Secondary | ICD-10-CM

## 2013-07-09 DIAGNOSIS — I359 Nonrheumatic aortic valve disorder, unspecified: Secondary | ICD-10-CM

## 2013-07-09 DIAGNOSIS — I059 Rheumatic mitral valve disease, unspecified: Secondary | ICD-10-CM

## 2013-07-09 LAB — RENAL FUNCTION PANEL
Albumin: 3.5 g/dL (ref 3.5–5.2)
Calcium: 10.1 mg/dL (ref 8.4–10.5)
Creatinine, Ser: 5.3 mg/dL — ABNORMAL HIGH (ref 0.50–1.10)
GFR calc non Af Amer: 7 mL/min — ABNORMAL LOW (ref >90)
Phosphorus: 4.8 mg/dL — ABNORMAL HIGH (ref 2.3–4.6)

## 2013-07-09 LAB — GLUCOSE, CAPILLARY
Glucose-Capillary: 76 mg/dL (ref 70–99)
Glucose-Capillary: 200 mg/dL — ABNORMAL HIGH (ref 70–99)

## 2013-07-09 MED ORDER — LIDOCAINE-PRILOCAINE 2.5-2.5 % EX CREA
1.0000 "application " | TOPICAL_CREAM | CUTANEOUS | Status: DC | PRN
  Filled 2013-07-09: qty 5

## 2013-07-09 MED ORDER — LIDOCAINE HCL (PF) 1 % IJ SOLN: 5.0000 mL | INTRAMUSCULAR | Status: DC | PRN

## 2013-07-09 MED ORDER — DOXERCALCIFEROL 4 MCG/2ML IV SOLN
INTRAVENOUS | Status: AC
  Administered 2013-07-09: 2 ug via INTRAVENOUS
  Filled 2013-07-09: qty 2

## 2013-07-09 MED ORDER — SODIUM CHLORIDE 0.9 % IV SOLN: 100.0000 mL | INTRAVENOUS | Status: DC | PRN

## 2013-07-09 MED ORDER — PENTAFLUOROPROP-TETRAFLUOROETH EX AERO: 1.0000 "application " | INHALATION_SPRAY | CUTANEOUS | Status: DC | PRN

## 2013-07-09 MED ORDER — NEPRO/CARBSTEADY PO LIQD
237.0000 mL | ORAL | Status: DC | PRN
  Filled 2013-07-09: qty 237

## 2013-07-09 MED ORDER — HEPARIN SODIUM (PORCINE) 1000 UNIT/ML DIALYSIS: 1000.0000 [IU] | INTRAMUSCULAR | Status: DC | PRN

## 2013-07-09 MED ORDER — ALTEPLASE 2 MG IJ SOLR
2.0000 mg | Freq: Once | INTRAMUSCULAR | Status: DC | PRN
  Filled 2013-07-09: qty 2

## 2013-07-09 NOTE — Progress Notes (Signed)
Subjective: No complaints.  The right side of her abdomen was hurting yesterday but not today.  Objective: Vital signs in last 24 hours: Temp:  [97.7 F (36.5 C)-98.9 F (37.2 C)] 98.2 F (36.8 C) (11/10 0754) Pulse Rate:  [50-57] 52 (11/10 0754) Resp:  [18-22] 19 (11/10 0754) BP: (134-171)/(39-120) 167/51 mmHg (11/10 0754) SpO2:  [97 %-100 %] 100 % (11/10 0754) Last BM Date: 07/08/13  Intake/Output from previous day: 11/09 0701 - 11/10 0700 In: 360 [P.O.:360] Out: -  Intake/Output this shift:    Medications Current Facility-Administered Medications  Medication Dose Route Frequency Provider Last Rate Last Dose  . acetaminophen (TYLENOL) tablet 650 mg  650 mg Oral Q4H PRN Wilburt Finlay, PA-C   650 mg at 07/09/13 0156  . amLODipine (NORVASC) tablet 2.5 mg  2.5 mg Oral Daily Lennette Bihari, MD   2.5 mg at 07/08/13 1116  . aspirin EC tablet 325 mg  325 mg Oral Daily PRN Lennette Bihari, MD      . aspirin EC tablet 81 mg  81 mg Oral Daily Lennette Bihari, MD   81 mg at 07/08/13 1117  . atorvastatin (LIPITOR) tablet 40 mg  40 mg Oral q1800 Wilburt Finlay, PA-C   40 mg at 07/08/13 1809  . calcium acetate (PHOSLO) capsule 1,334 mg  1,334 mg Oral TID WC Lennette Bihari, MD   1,334 mg at 07/09/13 0827  . docusate sodium (COLACE) capsule 100 mg  100 mg Oral PRN Lennette Bihari, MD      . doxercalciferol (HECTOROL) injection 2 mcg  2 mcg Intravenous Q M,W,F-HD Donald Pore, PA-C   2 mcg at 07/06/13 0907  . heparin injection 5,000 Units  5,000 Units Subcutaneous Q8H Lennette Bihari, MD   5,000 Units at 07/09/13 0557  . insulin aspart (novoLOG) injection 0-9 Units  0-9 Units Subcutaneous TID WC Wilburt Finlay, PA-C   1 Units at 07/08/13 1610  . metoprolol tartrate (LOPRESSOR) tablet 37.5 mg  37.5 mg Oral BID Lennette Bihari, MD   37.5 mg at 07/08/13 2144  . multivitamin (RENA-VIT) tablet 1 tablet  1 tablet Oral QHS Donald Pore, PA-C   1 tablet at 07/08/13 2143  . nitroGLYCERIN (NITROSTAT) SL  tablet 0.4 mg  0.4 mg Sublingual Q5 Min x 3 PRN Wilburt Finlay, PA-C      . ondansetron [facility]) injection 4 mg  4 mg Intravenous Q6H PRN Wilburt Finlay, PA-C      . senna (SENOKOT) tablet 8.6 mg  1 tablet Oral Daily Nada Boozer, NP   8.6 mg at 07/08/13 1117    PE: General appearance: alert, cooperative and no distress Lungs: clear to auscultation bilaterally Heart: regular rate and rhythm and 3/6 sys MM. Abdomen: Nontender.  +BS Extremities: 1+ LEE Pulses: 2+ righ radial.  1+ DPs. Skin: Warm and dry Neurologic: Grossly normal  Lab Results:   Recent Labs  07/06/13 2027 07/07/13 0450  WBC 5.3 6.3  HGB 11.3* 10.6*  HCT 34.6* 33.0*  PLT 142* 144*   BMET  Recent Labs  07/06/13 2027  CREATININE 2.75*    Assessment/Plan  Principal Problem:   NSTEMI (non-ST elevated myocardial infarction) Active Problems:   Aortic stenosis, severe, AVA0.4cm^2, LAst echo 01/2013   PAF (paroxysmal atrial fibrillation)   End stage renal disease   DM (diabetes mellitus)   HTN (hypertension)   HLD (hyperlipidemia)   COPD (chronic obstructive pulmonary disease)   Volume overload  CAD (coronary artery disease), NSTEMI 2011, CABG X 3 09/2009  Plan:  SP left heart cath which revealed:    Left ventriclular hypertrophy with preserved LV function with 2+ angiographic mitral regurgitation  Severe pulmonary hypertension.  Moderate aortic valve stenosis with a peak to peak gradient of 32 mm and a calculated aortic valve area of 1.1 cm.  Severely calcified coronary arteries with 60% proximal LAD stenosis, 80% diagonal stenosis; total occlusion of a large calcified obtuse marginal branch of the left circumflex coronary artery; and diffusely diseased severely calcified right coronary artery with 40% proximal 90% mid and 95% stenosis in the region of the acute margin with diffuse 60% narrowing beyond the PDA takeoff.  Patent LIMA to the LAD but without brisk flow distally due to competitive filling from the  native LAD injection.  Occluded vein graft which supplied the obtuse marginal branch of the circumflex.  Occluded vein graft that supplied the PDA.     Surgical evaluation recommended.  See Dr. Landry Dyke note.    EF 45-50% by echo.  AVA 0.55cm^2.  Grade 3 diastolic dysfunction.    BP elevated.  HD scheduled for today.      LOS: 5 days    HAGER, BRYAN 07/09/2013 9:06 AM  I have seen and examined the patient along with Wilburt Finlay, PA.  I have reviewed the chart, notes and new data.  I agree with PA's note.  Key new complaints: she feels well right now and has not had angina, dyspnea or dizziness today Key examination changes: loud early to mid-peaking systolic murmur of AS and very soft S2 Key new findings / data: reviewed echo The valve is very heavily calcified and has restricted mobility, but overall the findings are more suggestive of moderate to severe AS (jet still relatively early peaking, mean gradient less than 50). The AVA may be underestimated on the current echo report since the PW sample volume was placed to high in the LV and does not reflect true LV outflow velocities. On the other hand, the CW sample is clearly not coaxial and underestimates the true peak velocities. Notat older echos this year consistently showed a mean gradient of around 40 mm Hg. The echo clearly shows evidence of elevated right and left heart pressures - she was markedly hypervolemic at the time the echo was done (Nov 8), as well as moderate to severe pulmonary artery hypertension. Being a dialysis patient, this is likely to be a highly variable finding. Clinically she looks better today. These findings of severe diastolic dysfunction, elevated pressures and marked PAH were all present before the recent decompensation. 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 There are clear cut regional wall motion abnormalities, new since June.  PLAN: In my opinion, it is most likely that her  recent symptomatic deterioration was due to NSTEMI/loss of a saphenous vein bypass graft rather than progression of AS, on the background of severe/restrictive diastolic dysfunction. With the caveats listed above, I think the AS is not yet severe. Having said that, her aortic valve has all the adverse features suggestive of high likelihood of rapid progression to severe AS: small LV outflow tract, very heavy leaflet calcification, presence of ESRD.  Prognosis is poor since she is a marginal candidate for redo CABG/AVR and TAVR would not provide appropriate revascularization. Await surgical opinion today.   Thurmon Fair, MD, [facility] [phone] 07/09/2013, 1:20 PM

## 2013-07-09 NOTE — Progress Notes (Addendum)
VASCULAR LAB PRELIMINARY  PRELIMINARY  PRELIMINARY  PRELIMINARY  Pre-op Cardiac Surgery  Carotid Findings:  Bilateral:  1-39% ICA stenosis.  Vertebral artery flow is antegrade.      Upper Extremity Right Left  Brachial Pressures    Radial Waveforms    Ulnar Waveforms    Palmar Arch (Allen's Test)     Findings:      Lower  Extremity Right Left  Dorsalis Pedis    Anterior Tibial    Posterior Tibial    Ankle/Brachial Indices      Findings:   Please reorder Dopplers of the limbs when the patient is stable enough for surgery.  Nimrit Kehres, RVT 07/09/2013, 5:51 PM

## 2013-07-09 NOTE — Procedures (Addendum)
I was present at this dialysis session. I have reviewed the session itself and made appropriate changes.   Critline steep, might need to back off on UF. RS  Sabra Heck  MD 07/09/2013, 2:07 PM

## 2013-07-09 NOTE — Progress Notes (Signed)
Admit: 07/04/2013 LOS: 5  41F ESRD MWF with NSTEMI, Aortic Stenosis  Subjective:  Working out PCI vs redo CABG; AVR vs TAVR  Pt w/o complaints No CP or SOB  11/09 0701 - 11/10 0700 In: 360 [P.O.:360] Out: -   Filed Weights   07/06/13 0715 07/06/13 1058 07/07/13 0500  Weight: 56.4 kg (124 lb 5.4 oz) 51.7 kg (113 lb 15.7 oz) 54 kg (119 lb 0.8 oz)    Current meds: reviewed  Current Labs: reviewed   Outpt HD Orders Unit:  Time: 3.5h Dialyzer: F160 EDW: 53kg K/Ca: 2/2.25 Access: LUA AVF UF Proflie: 4 VDRA: Hectoral 2 qTx EPO: none IV Fe: none   Physical Exam:  Blood pressure 167/51, pulse 52, temperature 98.2 F (36.8 C), temperature source Oral, resp. rate 19, height 5\' 4"  (1.626 m), weight 54 kg (119 lb 0.8 oz), SpO2 100.00%. NAD RRR Nl WOB, some scattered coarse wheezies No LEE nonfocal LUA AVF +b/t AAOx3, nonfocal abd s/nt/nd  Assessment/Plan 1. ESRD via AVF MWF: HD today.   2. NSTEMI: plan being worked out for revascularlization  3. Aortic Stenosis: as above, cardiology and cardiac surgery making plans 4. Anemia: stable, not on Epo or Fe 5. MBD: Cont PhosLo and hectoral 6. HTN/Volume: got under EDW a week ago, new target EDW 51.5.  On amlod/MTP.    Sabra Heck MD 07/09/2013, 8:39 AM   Recent Labs Lab 07/04/13 1620 07/05/13 0545 07/06/13 2027  NA 137 138  --   K 3.2* 3.4*  --   CL 94* 95*  --   CO2 29 23  --   GLUCOSE 262* 145*  --   BUN 20 28*  --   CREATININE 3.25* 3.85* 2.75*  CALCIUM 8.9 9.6  --   PHOS  --  5.6*  --     Recent Labs Lab 07/04/13 1620  07/06/13 0453 07/06/13 2027 07/07/13 0450  WBC 5.6  < > 6.6 5.3 6.3  NEUTROABS 3.3  --   --   --   --   HGB 11.2*  < > 11.3* 11.3* 10.6*  HCT 34.2*  < > 34.3* 34.6* 33.0*  MCV 91.2  < > 90.7 92.5 91.2  PLT 139*  < > 142* 142* 144*  < > = values in this interval not displayed.  Current Facility-Administered Medications  Medication Dose Route Frequency Provider Last Rate  Last Dose  . acetaminophen (TYLENOL) tablet 650 mg  650 mg Oral Q4H PRN Wilburt Finlay, PA-C   650 mg at 07/09/13 0156  . amLODipine (NORVASC) tablet 2.5 mg  2.5 mg Oral Daily Lennette Bihari, MD   2.5 mg at 07/08/13 1116  . aspirin EC tablet 325 mg  325 mg Oral Daily PRN Lennette Bihari, MD      . aspirin EC tablet 81 mg  81 mg Oral Daily Lennette Bihari, MD   81 mg at 07/08/13 1117  . atorvastatin (LIPITOR) tablet 40 mg  40 mg Oral q1800 Wilburt Finlay, PA-C   40 mg at 07/08/13 1809  . calcium acetate (PHOSLO) capsule 1,334 mg  1,334 mg Oral TID WC Lennette Bihari, MD   1,334 mg at 07/09/13 0827  . docusate sodium (COLACE) capsule 100 mg  100 mg Oral PRN Lennette Bihari, MD      . doxercalciferol (HECTOROL) injection 2 mcg  2 mcg Intravenous Q M,W,F-HD Donald Pore, PA-C   2 mcg at 07/06/13 0907  . heparin injection 5,000 Units  5,000 Units Subcutaneous Q8H Lennette Bihari, MD   5,000 Units at 07/09/13 0557  . insulin aspart (novoLOG) injection 0-9 Units  0-9 Units Subcutaneous TID WC Wilburt Finlay, PA-C   1 Units at 07/08/13 4098  . metoprolol tartrate (LOPRESSOR) tablet 37.5 mg  37.5 mg Oral BID Lennette Bihari, MD   37.5 mg at 07/08/13 2144  . multivitamin (RENA-VIT) tablet 1 tablet  1 tablet Oral QHS Donald Pore, PA-C   1 tablet at 07/08/13 2143  . nitroGLYCERIN (NITROSTAT) SL tablet 0.4 mg  0.4 mg Sublingual Q5 Min x 3 PRN Wilburt Finlay, PA-C      . ondansetron [facility]) injection 4 mg  4 mg Intravenous Q6H PRN Wilburt Finlay, PA-C      . senna (SENOKOT) tablet 8.6 mg  1 tablet Oral Daily Nada Boozer, NP   8.6 mg at 07/08/13 1117

## 2013-07-09 NOTE — Consult Note (Signed)
[address]       [place] [address]             [phone]        SHANDORA KOOGLER [facility] Medical Record #[id number] Date of Birth: 10/31/1940  Referring: No ref. 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 This developed while she was on dialysis. She was initially taken to her local hospital and then transferred to [facility]. Her cardiac enzymes were positive with a troponin of 10.6. Her atrial for relation was treated medically and she was placed on IV heparin. Cardiac catheterization was performed. She previously had undergone CABG x3 in 2011--left IMA to LAD and vein grafts to the PDA and circumflex.  Cardiac catheterization at this admit demonstrated patent left IMA to LAD, chronic occlusion of of the native circumflex and the vein graft to the circumflex, occlusion of the saphenous vein graft to the PDA with a very small patent distal PDA too small to re\- graft Right heart catheterization demonstrated pulmonary hypertension. She had a gradient across the aortic valve with a calculated valve area of 1.1. 2-D echocardiogram demonstrated aortic stenosis with calcified aortic valve, mild/moderate mitral regurgitation.. Overall EF is 50% with lateral wall hypokinesia.        Current Activity/ Functional Status: Patient has a sedentary lifestyle at home and has dialysis Monday Wednesday Friday   Zubrod Score: At the time of surgery this patient's most appropriate activity status/level should be described as: []  Normal activity, no symptoms [x]  Symptoms, fully ambulatory []  Symptoms, in bed less than or equal to 50% of the time []  Symptoms, in bed greater than 50% of the time but less than  100% []  Bedridden []  Moribund  Past Medical History  Diagnosis Date  . CHF (congestive heart failure)   . Diabetes mellitus   . Chronic kidney disease   . Hyperlipidemia   . Anemia   . Hypertension   . COPD (chronic obstructive pulmonary disease)   . Peripheral vascular disease   . Coronary artery disease   . Hx of echocardiogram 03/2011    showed a mean gradient that had increased to 41 and a peak gradient that had increase to 70, she has moderate tricuspid regurigitation, mild to moderate mitral regurigitation, and significant LA dilation.    Past Surgical History  Procedure Laterality Date  . Coronary artery bypass graft  09/2009    x3 by Dr Alla German with LIMA to the LAD, a vein to the circumflex and a vien to the PDA.  Marland Kitchen Abdominal hysterectomy    . Thoracentesis  11-2009  . Av fistula placement  01-12-2008    left upper arm  . Cardiac catheterization  10/15/2009    which showed low normal LV function with mild inferior hypocontractility.    History  Smoking status  . Current Every Day Smoker -- 0.50 packs/day  . Types: Cigarettes  . Start date: 06/19/1973  Smokeless tobacco  . Never Used    History  Alcohol Use No    History   Social History  . Marital Status: Widowed    Spouse Name: N/A    Number of Children: N/A  . Years of Education: N/A   Occupational History  .  Not on file.   Social History Main Topics  . Smoking status: Current Every Day Smoker -- 0.50 packs/day    Types: Cigarettes    Start date: 06/19/1973  . Smokeless tobacco: Never Used  . Alcohol Use: No  . Drug Use: No  . Sexual Activity: No   Other Topics Concern  . Not on file   Social History Narrative  . No narrative on file    Allergies  Allergen Reactions  . Sulfa Antibiotics Other (See Comments)    unknown  . Codeine Rash    Current Facility-Administered Medications  Medication Dose Route Frequency Provider Last Rate Last Dose  . acetaminophen (TYLENOL) tablet 650  mg  650 mg Oral Q4H PRN Wilburt Finlay, PA-C   650 mg at 07/09/13 0156  . amLODipine (NORVASC) tablet 2.5 mg  2.5 mg Oral Daily Lennette Bihari, MD   2.5 mg at 07/09/13 0948  . aspirin EC tablet 325 mg  325 mg Oral Daily PRN Lennette Bihari, MD      . aspirin EC tablet 81 mg  81 mg Oral Daily Lennette Bihari, MD   81 mg at 07/09/13 0948  . atorvastatin (LIPITOR) tablet 40 mg  40 mg Oral q1800 Wilburt Finlay, PA-C   40 mg at 07/09/13 1805  . calcium acetate (PHOSLO) capsule 1,334 mg  1,334 mg Oral TID WC Lennette Bihari, MD   1,334 mg at 07/09/13 1805  . docusate sodium (COLACE) capsule 100 mg  100 mg Oral PRN Lennette Bihari, MD      . doxercalciferol (HECTOROL) injection 2 mcg  2 mcg Intravenous Q M,W,F-HD Donald Pore, PA-C   2 mcg at 07/09/13 1534  . heparin injection 5,000 Units  5,000 Units Subcutaneous Q8H Lennette Bihari, MD   5,000 Units at 07/09/13 0557  . insulin aspart (novoLOG) injection 0-9 Units  0-9 Units Subcutaneous TID WC Wilburt Finlay, PA-C   1 Units at 07/08/13 4540  . metoprolol tartrate (LOPRESSOR) tablet 37.5 mg  37.5 mg Oral BID Lennette Bihari, MD   37.5 mg at 07/09/13 0948  . multivitamin (RENA-VIT) tablet 1 tablet  1 tablet Oral QHS Donald Pore, PA-C   1 tablet at 07/08/13 2143  . nitroGLYCERIN (NITROSTAT) SL tablet 0.4 mg  0.4 mg Sublingual Q5 Min x 3 PRN Wilburt Finlay, PA-C      . ondansetron [facility]) injection 4 mg  4 mg Intravenous Q6H PRN Wilburt Finlay, PA-C      . senna (SENOKOT) tablet 8.6 mg  1 tablet Oral Daily Nada Boozer, NP   8.6 mg at 07/08/13 1117    Prescriptions prior to admission  Medication Sig Dispense Refill  . aspirin EC 325 MG tablet Take 325 mg by mouth daily as needed for moderate pain.      . calcium acetate (PHOSLO) 667 MG capsule Take 1,334 mg by mouth 3 (three) times daily with meals.       . insulin NPH-insulin regular (NOVOLIN 70/30) (70-30) 100 UNIT/ML injection Inject 10 Units into the skin daily as needed (when blood sugar is low). As directed per  sliding scale      . [DISCONTINUED] metoprolol tartrate (LOPRESSOR) 25 MG tablet Take 25 mg by mouth 2 (two) times daily.      Marland Kitchen acetaminophen (TYLENOL) 500 MG tablet Take 1,000 mg by mouth every 6 (six) hours as needed for moderate pain.      . Aspirin-Acetaminophen-Caffeine (GOODY HEADACHE PO) Take  1 packet by mouth daily as needed (pain).      Marland Kitchen docusate sodium (COLACE) 100 MG capsule Take 100 mg by mouth as needed for mild constipation.         Family History  Problem Relation Age of Onset  . Heart disease Mother      Review of Systems:     Cardiac Review of Systems: Y or N  Chest Pain [   Y. ]  Resting SOB [ Y.  ] Exertional SOB  [ Y. ]  Orthopnea [N.  ]   Pedal Edema [Y.   ]    Palpitations [Y.] Syncope  [ and ]   Presyncope [  N. ]  General Review of Systems: [Y] = yes [  ]=no Constitional: recent weight change [  ]; anorexia [  ]; fatigue [  ]; nausea [  ]; night sweats [  ]; fever [  ]; or chills [  ]                                                               Dental: poor dentition[  ]; Last Dentist visit: Greater than one year  Eye : blurred vision [  ]; diplopia [   ]; vision changes [  ];  Amaurosis fugax[  ]; Resp: cough [  ];  wheezing[  ];  hemoptysis[  ]; shortness of breath[ Y. ]; paroxysmal nocturnal dyspnea[  ]; dyspnea on exertion[Y.  ]; or orthopnea[  ];  GI:  gallstones[  ], vomiting[  ];  dysphagia[  ]; melena[  ];  hematochezia [  ]; heartburn[  Y.];   Hx of  Colonoscopy[  ]; GU: kidney stones [  ]; hematuria[  ];   dysuria [  ];  nocturia[  ];  history of     obstruction [  ]; urinary frequency [  ]             Skin: rash, swelling[  ];, hair loss[  ];  peripheral edema[Y.  ];  or itching[  ]; Musculosketetal: myalgias[  ];  joint swelling[  ];  joint erythema[  ];  joint pain[  ];  back pain[  ];  Heme/Lymph: bruising[  ];  bleeding[  ];  anemia[  ];  Neuro: TIA[  ];  headaches[  ];  stroke[  ];  vertigo[  ];  seizures[  ];   paresthesias[  ];  difficulty  walking[  ];  Psych:depression[  ]; anxiety[  ];  Endocrine: diabetes[  ];  thyroid dysfunction[  ];  Immunizations: Flu [  ]; Pneumococcal[  ];  Other:  Physical Exam: BP 157/70  Pulse 56  Temp(Src) 98.1 F (36.7 C) (Oral)  Resp 19  Ht 5\' 4"  (1.626 m)  Wt 115 lb 4.8 oz (52.3 kg)  BMI 19.78 kg/m2  SpO2 100%  General appearance-elderly female on hemodialysis no distress with audible wheezes HEENT normocephalic dentition adequate Neck-mild JVD no mass no bruit Chest-well-healed sternal incision, scattered bilateral wheezes Cardiac-regular rhythm systolic murmur 3/6 Abdomen-nontender without pulsatile mass Extremities-mild clubbing, atrophic skin changes of the lower extremities with mild pedal edema Neurologic-alert and responsive no focal motor weakness Vascular-functional access for dialysis graft in left arm Diagnostic Studies & Laboratory data:   Cath  films, echocardiogram, x-rays reviewed  Recent Radiology Findings:   No results found.    Recent Lab Findings: Lab Results  Component Value Date   WBC 6.3 07/07/2013   HGB 10.6* 07/07/2013   HCT 33.0* 07/07/2013   PLT 144* 07/07/2013   GLUCOSE 111* 07/09/2013   CHOL 152 07/05/2013   TRIG 47 07/05/2013   HDL 99 07/05/2013   LDLCALC 44 07/05/2013   ALT 34 07/04/2013   AST 41* 07/04/2013   NA 129* 07/09/2013   K 4.5 07/09/2013   CL 89* 07/09/2013   CREATININE 5.30* 07/09/2013   BUN 34* 07/09/2013   CO2 26 07/09/2013   TSH 1.251 07/04/2013   INR 1.12 06/19/2013   HGBA1C 8.5* 07/04/2013      Assessment / Plan:     72 year old female on chronic dialysis with severe COPD and recent myocardial infarction with positive cardiac enzymes. Coronary arteries are not suitable for re\re grafting and are not adequate targets. 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 I discussed the situation with the patient and  she understands that redo CABG or surgical aVR would not be of benefit to her because of extremely high-risk.        @ME1 @ 07/09/2013 6:13 PM

## 2013-07-10 ENCOUNTER — Encounter (HOSPITAL_COMMUNITY): Payer: Medicare Other

## 2013-07-10 LAB — GLUCOSE, CAPILLARY
Glucose-Capillary: 239 mg/dL — ABNORMAL HIGH (ref 70–99)
Glucose-Capillary: 116 mg/dL — ABNORMAL HIGH (ref 70–99)
Glucose-Capillary: 266 mg/dL — ABNORMAL HIGH (ref 70–99)

## 2013-07-10 MED ORDER — SODIUM CHLORIDE 0.9 % IV SOLN
1.0000 mL/kg/h | INTRAVENOUS | Status: DC
  Administered 2013-07-11: 1 mL/kg/h via INTRAVENOUS

## 2013-07-10 MED ORDER — METOPROLOL TARTRATE 25 MG PO TABS
25.0000 mg | ORAL_TABLET | Freq: Two times a day (BID) | ORAL | Status: DC
  Administered 2013-07-10 – 2013-07-12 (×3): 25 mg via ORAL
  Filled 2013-07-10 (×4): qty 1

## 2013-07-10 MED ORDER — SODIUM CHLORIDE 0.9 % IJ SOLN: 3.0000 mL | INTRAMUSCULAR | Status: DC | PRN

## 2013-07-10 MED ORDER — SODIUM CHLORIDE 0.9 % IV SOLN: 250.0000 mL | INTRAVENOUS | Status: DC | PRN

## 2013-07-10 MED ORDER — SODIUM CHLORIDE 0.9 % IJ SOLN
3.0000 mL | Freq: Two times a day (BID) | INTRAMUSCULAR | Status: DC
  Administered 2013-07-10: 3 mL via INTRAVENOUS

## 2013-07-10 NOTE — Progress Notes (Signed)
Pt. Seen and examined. Agree with the NP/PA-C note as written.  Appreciate Dr. Lorrin Mais opinon. He feels that the risk of surgery is too high.  I am also not convinced the Aortic Valve is truly the cause of her symptoms. She may benefit from revascularization of the RCA in the setting of acute graft closure. I agree with pursing HSRA and PCI to the RCA with Dr. Tresa Endo tomorrow. Decrease b-blocker due to bradycardia. I don't think she is a TAVR candidate due to her small LVOT diameter.  Chrystie Nose, MD, [facility] Attending Cardiologist [facility]

## 2013-07-10 NOTE — Progress Notes (Signed)
Nurse tech informed nurse that patient O2 saturation was 75% on room air. Oxygen was applied and titrated up to 4 liters nasal cannula to obtain saturation above 92%. Patient was in a deep sleep after receiving zofran IV for nausea around 2319. Was able to awake patient and converse as she remained sleepy. O2 saturation went up to 100% and O2 was titrated down to 2 liters nasal cannula. Bed alarm on and will monitor closely.

## 2013-07-10 NOTE — Progress Notes (Signed)
Subjective:  No chest pain or SOB this am.  Objective:  Vital Signs in the last 24 hours: Temp:  [97.3 F (36.3 C)-98.9 F (37.2 C)] 97.7 F (36.5 C) (11/11 0800) Pulse Rate:  [45-57] 49 (11/11 0800) Resp:  [13-22] 15 (11/11 0800) BP: (93-163)/(28-70) 132/38 mmHg (11/11 0800) SpO2:  [75 %-100 %] 100 % (11/11 0800) Weight:  [115 lb 4.8 oz (52.3 kg)-121 lb 7.6 oz (55.1 kg)] 115 lb 4.8 oz (52.3 kg) (11/10 1535)  Intake/Output from previous day:  Intake/Output Summary (Last 24 hours) at 07/10/13 0915 Last data filed at 07/09/13 2130  Gross per 24 hour  Intake    480 ml  Output   2824 ml  Net  -2344 ml    Physical Exam: General appearance: alert, cooperative and no distress Lungs: decreased breath sounds Lt base Heart: regular rate and rhythm and 3/6 systolic murmur   Rate: 53  Rhythm: sinus bradycardia  Lab Results: No results found for this basename: WBC, HGB, PLT,  in the last 72 hours  Recent Labs  07/09/13 1153  NA 129*  K 4.5  CL 89*  CO2 26  GLUCOSE 111*  BUN 34*  CREATININE 5.30*   No results found for this basename: TROPONINI, CK, MB,  in the last 72 hours No results found for this basename: INR,  in the last 72 hours  Imaging: Imaging results have been reviewed  Cardiac Studies:  Assessment/Plan:   Principal Problem:   NSTEMI (non-ST elevated myocardial infarction) Active Problems:   End stage renal disease   DM (diabetes mellitus)   HTN (hypertension)   HLD (hyperlipidemia)   COPD (chronic obstructive pulmonary disease)   Aortic stenosis, severe, AVA0.4cm^2, LAst echo 01/2013   PAF (paroxysmal atrial fibrillation)   Volume overload   CAD (coronary artery disease), NSTEMI 2011, CABG X 3 09/2009    PLAN:  HR in the 40s last night- doubt she can tolerate 37.5mg  of Metoprolol- will decrease to 25 mg BID. Not a surgical candidate. ? Proceed with PCI/ HSRA to RCA in am.   Corine Shelter PA-C Beeper [phone] 07/10/2013, 9:15 AM

## 2013-07-10 NOTE — Progress Notes (Signed)
Admit: 07/04/2013 LOS: 6  63F ESRD MWF with NSTEMI, Aortic Stenosis  Subjective:  Seen by cardiac surgery, not a surgical candiate for redo CABG or AVR HD yesterday, 2.8L UF.  Pt w/o complaints.    11/10 0701 - 11/11 0700 In: 480 [P.O.:480] Out: 2824   Filed Weights   07/07/13 0500 07/09/13 1150 07/09/13 1535  Weight: 54 kg (119 lb 0.8 oz) 55.1 kg (121 lb 7.6 oz) 52.3 kg (115 lb 4.8 oz)    Current meds: reviewed  Current Labs: reviewed   Outpt HD Orders Unit: [place] Time: 3.5h Dialyzer: F160 EDW: 53kg K/Ca: 2/2.25 Access: LUA AVF UF Proflie: 4 VDRA: Hectoral 2 qTx EPO: none IV Fe: none   Physical Exam:  Blood pressure 132/38, pulse 49, temperature 97.7 F (36.5 C), temperature source Oral, resp. rate 15, height 5\' 4"  (1.626 m), weight 52.3 kg (115 lb 4.8 oz), SpO2 100.00%. NAD RRR Nl WOB, ctab No LEE nonfocal LUA AVF +b/t AAOx3, nonfocal abd s/nt/nd  Assessment/Plan 1. ESRD via AVF MWF: Keep on MWF schedule 2. NSTEMI: plan being worked out for revascularlization options 3. Aortic Stenosis: appears moderate and not a surgical candidate 4. Anemia: stable, not on Epo or Fe 5. MBD: Cont PhosLo and hectoral; Ca and Phos at goal.   6. HTN/Volume: got under EDW a week ago, new target EDW 51.5.  On amlod/MTP.    Sabra Heck MD 07/10/2013, 10:20 AM   Recent Labs Lab 07/04/13 1620 07/05/13 0545 07/06/13 2027 07/09/13 1153  NA 137 138  --  129*  K 3.2* 3.4*  --  4.5  CL 94* 95*  --  89*  CO2 29 23  --  26  GLUCOSE 262* 145*  --  111*  BUN 20 28*  --  34*  CREATININE 3.25* 3.85* 2.75* 5.30*  CALCIUM 8.9 9.6  --  10.1  PHOS  --  5.6*  --  4.8*    Recent Labs Lab 07/04/13 1620  07/06/13 0453 07/06/13 2027 07/07/13 0450  WBC 5.6  < > 6.6 5.3 6.3  NEUTROABS 3.3  --   --   --   --   HGB 11.2*  < > 11.3* 11.3* 10.6*  HCT 34.2*  < > 34.3* 34.6* 33.0*  MCV 91.2  < > 90.7 92.5 91.2  PLT 139*  < > 142* 142* 144*  < > = values in this interval not  displayed.  Current Facility-Administered Medications  Medication Dose Route Frequency Provider Last Rate Last Dose  . acetaminophen (TYLENOL) tablet 650 mg  650 mg Oral Q4H PRN Wilburt Finlay, PA-C   650 mg at 07/09/13 2056  . amLODipine (NORVASC) tablet 2.5 mg  2.5 mg Oral Daily Lennette Bihari, MD   2.5 mg at 07/10/13 0981  . aspirin EC tablet 325 mg  325 mg Oral Daily PRN Lennette Bihari, MD      . aspirin EC tablet 81 mg  81 mg Oral Daily Lennette Bihari, MD   81 mg at 07/10/13 1914  . atorvastatin (LIPITOR) tablet 40 mg  40 mg Oral q1800 Wilburt Finlay, PA-C   40 mg at 07/09/13 1805  . calcium acetate (PHOSLO) capsule 1,334 mg  1,334 mg Oral TID WC Lennette Bihari, MD   1,334 mg at 07/10/13 0754  . docusate sodium (COLACE) capsule 100 mg  100 mg Oral PRN Lennette Bihari, MD      . doxercalciferol (HECTOROL) injection 2 mcg  2 mcg Intravenous  Q M,W,F-HD Donald Pore, PA-C   2 mcg at 07/09/13 1534  . heparin injection 5,000 Units  5,000 Units Subcutaneous Q8H Lennette Bihari, MD   5,000 Units at 07/10/13 [phone]  . insulin aspart (novoLOG) injection 0-9 Units  0-9 Units Subcutaneous TID WC Wilburt Finlay, PA-C   1 Units at 07/08/13 9604  . metoprolol tartrate (LOPRESSOR) tablet 25 mg  25 mg Oral BID Abelino Derrick, PA-C   25 mg at 07/10/13 5409  . multivitamin (RENA-VIT) tablet 1 tablet  1 tablet Oral QHS Donald Pore, PA-C   1 tablet at 07/09/13 2102  . nitroGLYCERIN (NITROSTAT) SL tablet 0.4 mg  0.4 mg Sublingual Q5 Min x 3 PRN Wilburt Finlay, PA-C      . ondansetron [facility]) injection 4 mg  4 mg Intravenous Q6H PRN Wilburt Finlay, PA-C   4 mg at 07/09/13 2319  . senna (SENOKOT) tablet 8.6 mg  1 tablet Oral Daily Nada Boozer, NP   8.6 mg at 07/10/13 [phone]

## 2013-07-11 ENCOUNTER — Encounter (HOSPITAL_COMMUNITY)
Admission: EM | Disposition: A | Discharge status: Home / Self Care | Payer: Medicare Other | Source: Other Acute Inpatient Hospital | Attending: Cardiovascular Disease

## 2013-07-11 ENCOUNTER — Other Ambulatory Visit: Payer: Self-pay | Admitting: *Deleted

## 2013-07-11 ENCOUNTER — Telehealth: Payer: Self-pay | Admitting: *Deleted

## 2013-07-11 HISTORY — PX: PERCUTANEOUS CORONARY ROTOBLATOR INTERVENTION (PCI-R): SHX5484

## 2013-07-11 LAB — BASIC METABOLIC PANEL
BUN: 24 mg/dL — ABNORMAL HIGH (ref 6–23)
Chloride: 90 mEq/L — ABNORMAL LOW (ref 96–112)
GFR calc Af Amer: 10 mL/min — ABNORMAL LOW (ref >90)
GFR calc non Af Amer: 8 mL/min — ABNORMAL LOW (ref >90)
Glucose, Bld: 147 mg/dL — ABNORMAL HIGH (ref 70–99)
Potassium: 4.6 mEq/L (ref 3.5–5.1)

## 2013-07-11 LAB — GLUCOSE, CAPILLARY
Glucose-Capillary: 140 mg/dL — ABNORMAL HIGH (ref 70–99)
Glucose-Capillary: 119 mg/dL — ABNORMAL HIGH (ref 70–99)
Glucose-Capillary: 150 mg/dL — ABNORMAL HIGH (ref 70–99)

## 2013-07-11 LAB — POCT ACTIVATED CLOTTING TIME
Activated Clotting Time: 176 seconds
Activated Clotting Time: 186 seconds
Activated Clotting Time: 196 seconds

## 2013-07-11 SURGERY — PERCUTANEOUS CORONARY ROTOBLATOR INTERVENTION (PCI-R)
Anesthesia: Moderate Sedation | Laterality: Left

## 2013-07-11 MED ORDER — NITROGLYCERIN 0.2 MG/ML ON CALL CATH LAB
INTRAVENOUS | Status: AC
  Filled 2013-07-11: qty 1

## 2013-07-11 MED ORDER — LIDOCAINE HCL (PF) 1 % IJ SOLN
INTRAMUSCULAR | Status: AC
  Filled 2013-07-11: qty 30

## 2013-07-11 MED ORDER — ONDANSETRON HCL 4 MG/2ML IJ SOLN: 4.0000 mg | Freq: Four times a day (QID) | INTRAMUSCULAR | Status: DC | PRN

## 2013-07-11 MED ORDER — FENTANYL CITRATE 0.05 MG/ML IJ SOLN
INTRAMUSCULAR | Status: AC
  Filled 2013-07-11: qty 2

## 2013-07-11 MED ORDER — ACETAMINOPHEN 325 MG PO TABS
650.0000 mg | ORAL_TABLET | ORAL | Status: DC | PRN
  Administered 2013-07-12: 650 mg via ORAL

## 2013-07-11 MED ORDER — CLOPIDOGREL BISULFATE 300 MG PO TABS
ORAL_TABLET | ORAL | Status: AC
  Filled 2013-07-11: qty 1

## 2013-07-11 MED ORDER — CLOPIDOGREL BISULFATE 75 MG PO TABS
75.0000 mg | ORAL_TABLET | Freq: Every day | ORAL | Status: DC
  Administered 2013-07-12: 75 mg via ORAL
  Filled 2013-07-11: qty 1

## 2013-07-11 MED ORDER — SODIUM CHLORIDE 0.9 % IV SOLN: INTRAVENOUS | Status: DC

## 2013-07-11 MED ORDER — HEPARIN (PORCINE) IN NACL 2-0.9 UNIT/ML-% IJ SOLN
INTRAMUSCULAR | Status: AC
  Filled 2013-07-11: qty 1000

## 2013-07-11 MED ORDER — BIVALIRUDIN 250 MG IV SOLR
INTRAVENOUS | Status: AC
  Filled 2013-07-11: qty 250

## 2013-07-11 MED ORDER — VERAPAMIL HCL 2.5 MG/ML IV SOLN
INTRAVENOUS | Status: AC
  Filled 2013-07-11: qty 2

## 2013-07-11 MED ORDER — ASPIRIN EC 81 MG PO TBEC
81.0000 mg | DELAYED_RELEASE_TABLET | Freq: Every day | ORAL | Status: DC
  Administered 2013-07-12: 81 mg via ORAL
  Filled 2013-07-11: qty 1

## 2013-07-11 MED ORDER — MIDAZOLAM HCL 2 MG/2ML IJ SOLN
INTRAMUSCULAR | Status: AC
  Filled 2013-07-11: qty 2

## 2013-07-11 NOTE — Interval H&P Note (Signed)
Cath Lab Visit (complete for each Cath Lab visit)  Clinical Evaluation Leading to the Procedure:   ACS: yes  Non-ACS:    Anginal Classification: CCS IV  Anti-ischemic medical therapy: Maximal Therapy (2 or more classes of medications)  Non-Invasive Test Results: No non-invasive testing performed  Prior CABG: Previous CABG      History and Physical Interval Note:  07/11/2013 2:15 PM  [name]  has presented today for surgery, with the diagnosis of calcified CAD  The various methods of treatment have been discussed with the patient and family. After consideration of risks, benefits and other options for treatment, the patient has consented to  Procedure(s): PERCUTANEOUS CORONARY ROTOBLATOR INTERVENTION (PCI-R) (Left) as a surgical intervention .  The patient's history has been reviewed, patient examined, no change in status, stable for surgery.  I have reviewed the patient's chart and labs.  Questions were answered to the patient's satisfaction.     KELLY,THOMAS A

## 2013-07-11 NOTE — H&P (View-Only) (Signed)
Subjective: No complaints. Denies CP/SOB.  Objective: Vital signs in last 24 hours: Temp:  [97.9 F (36.6 C)-98.8 F (37.1 C)] 98.2 F (36.8 C) (11/12 0410) Pulse Rate:  [47-54] 51 (11/12 0700) Resp:  [15-23] 21 (11/12 0700) BP: (122-150)/(31-46) 146/46 mmHg (11/12 0400) SpO2:  [96 %-100 %] 100 % (11/12 0700) Last BM Date: 07/09/13  Intake/Output from previous day: 11/11 0701 - 11/12 0700 In: 978.4 [P.O.:900; I.V.:78.4] Out: -  Intake/Output this shift:    Medications Current Facility-Administered Medications  Medication Dose Route Frequency Provider Last Rate Last Dose  . 0.9 %  sodium chloride infusion  250 mL Intravenous PRN Chrystie Nose, MD      . 0.9 %  sodium chloride infusion  1 mL/kg/hr Intravenous Continuous Chrystie Nose, MD 20 mL/hr at 07/11/13 0600 0.382 mL/kg/hr at 07/11/13 0600  . acetaminophen (TYLENOL) tablet 650 mg  650 mg Oral Q4H PRN Wilburt Finlay, PA-C   650 mg at 07/09/13 2056  . amLODipine (NORVASC) tablet 2.5 mg  2.5 mg Oral Daily Lennette Bihari, MD   2.5 mg at 07/10/13 1610  . aspirin EC tablet 325 mg  325 mg Oral Daily PRN Lennette Bihari, MD      . aspirin EC tablet 81 mg  81 mg Oral Daily Lennette Bihari, MD   81 mg at 07/11/13 0640  . atorvastatin (LIPITOR) tablet 40 mg  40 mg Oral q1800 Wilburt Finlay, PA-C   40 mg at 07/10/13 1731  . calcium acetate (PHOSLO) capsule 1,334 mg  1,334 mg Oral TID WC Lennette Bihari, MD   1,334 mg at 07/10/13 1731  . docusate sodium (COLACE) capsule 100 mg  100 mg Oral PRN Lennette Bihari, MD      . doxercalciferol (HECTOROL) injection 2 mcg  2 mcg Intravenous Q M,W,F-HD Donald Pore, PA-C   2 mcg at 07/09/13 1534  . heparin injection 5,000 Units  5,000 Units Subcutaneous Q8H Lennette Bihari, MD   5,000 Units at 07/11/13 0617  . insulin aspart (novoLOG) injection 0-9 Units  0-9 Units Subcutaneous TID WC Wilburt Finlay, PA-C   3 Units at 07/10/13 1324  . metoprolol tartrate (LOPRESSOR) tablet 25 mg  25 mg Oral BID Abelino Derrick, PA-C   25 mg at 07/10/13 2156  . multivitamin (RENA-VIT) tablet 1 tablet  1 tablet Oral QHS Donald Pore, PA-C   1 tablet at 07/10/13 2156  . nitroGLYCERIN (NITROSTAT) SL tablet 0.4 mg  0.4 mg Sublingual Q5 Min x 3 PRN Wilburt Finlay, PA-C      . ondansetron [facility]) injection 4 mg  4 mg Intravenous Q6H PRN Wilburt Finlay, PA-C   4 mg at 07/09/13 2319  . senna (SENOKOT) tablet 8.6 mg  1 tablet Oral Daily Nada Boozer, NP   8.6 mg at 07/10/13 0951  . sodium chloride 0.9 % injection 3 mL  3 mL Intravenous Q12H Chrystie Nose, MD   3 mL at 07/10/13 2156  . sodium chloride 0.9 % injection 3 mL  3 mL Intravenous PRN Chrystie Nose, MD        PE: General appearance: alert, cooperative and no distress Lungs: clear to auscultation bilaterally Heart: regular rate and rhythm and 3/6 SM Extremities: no LEE Pulses: 2+ and symmetric Skin: warm and dry Neurologic: Grossly normal  Lab Results:  No results found for this basename: WBC, HGB, HCT, PLT,  in the last 72 hours BMET  Recent  Labs  07/09/13 1153  NA 129*  K 4.5  CL 89*  CO2 26  GLUCOSE 111*  BUN 34*  CREATININE 5.30*  CALCIUM 10.1    Assessment/Plan  Principal Problem:   NSTEMI (non-ST elevated myocardial infarction) Active Problems:   End stage renal disease   DM (diabetes mellitus)   HTN (hypertension)   HLD (hyperlipidemia)   COPD (chronic obstructive pulmonary disease)   Aortic stenosis, severe, AVA0.4cm^2, LAst echo 01/2013   PAF (paroxysmal atrial fibrillation)   Volume overload   CAD (coronary artery disease), NSTEMI 2011, CABG X 3 09/2009  Plan:  HSRA and PCI to the RCA by Dr. Tresa Endo today. BP stable. Bradycardic w/ HR in the low 50s. Asymptomatic. ? Holding BB dose prior to procedure. Dr. Tresa Endo to follow.        LOS: 7 days    [name] 07/11/2013 8:05 AM   Patient seen and examined. Agree with assessment and plan. Discussed complex PCI with patient and family. Plan temporary  pacemaker and HSRA/stenting of severely calcified RCA today. Answered questions. Appreciate Dr. Zenaida Niece Tright's re-assessment.    Lennette Bihari, MD, [facility] 07/11/2013 12:26 PM

## 2013-07-11 NOTE — Progress Notes (Signed)
Admit: 07/04/2013 LOS: 7  20F ESRD MWF with NSTEMI, Aortic Stenosis  Subjective:  NAEON Plan for LHC later this morning, then HD Pt w/o complaints  11/11 0701 - 11/12 0700 In: 978.4 [P.O.:900; I.V.:78.4] Out: 0   Filed Weights   07/07/13 0500 07/09/13 1150 07/09/13 1535  Weight: 54 kg (119 lb 0.8 oz) 55.1 kg (121 lb 7.6 oz) 52.3 kg (115 lb 4.8 oz)    Current meds: reviewed  Current Labs: reviewed   Outpt HD Orders Unit: [facility] Time: 3.5h Dialyzer: F160 EDW: 53kg K/Ca: 2/2.25 Access: LUA AVF UF Proflie: 4 VDRA: Hectoral 2 qTx EPO: none IV Fe: none   Physical Exam:  Blood pressure 125/46, pulse 56, temperature 98.1 F (36.7 C), temperature source Oral, resp. rate 15, height 5\' 4"  (1.626 m), weight 52.3 kg (115 lb 4.8 oz), SpO2 100.00%. NAD RRR Nl WOB, ctab No LEE nonfocal LUA AVF +b/t AAOx3, nonfocal abd s/nt/nd  Assessment/Plan 1. ESRD via AVF MWF: Keep on MWF schedule.  2. NSTEMI: to LHC today 3. Aortic Stenosis: appears moderate and not a surgical candidate 4. Anemia: stable, not on Epo or Fe 5. MBD: Cont PhosLo and hectoral; Ca and Phos at goal.   6. HTN/Volume: got under EDW a week ago, new target EDW 51.5.  On amlod/MTP.    Sabra Heck MD 07/11/2013, 4:45 PM   Recent Labs Lab 07/05/13 0545 07/06/13 2027 07/09/13 1153  NA 138  --  129*  K 3.4*  --  4.5  CL 95*  --  89*  CO2 23  --  26  GLUCOSE 145*  --  111*  BUN 28*  --  34*  CREATININE 3.85* 2.75* 5.30*  CALCIUM 9.6  --  10.1  PHOS 5.6*  --  4.8*    Recent Labs Lab 07/06/13 0453 07/06/13 2027 07/07/13 0450  WBC 6.6 5.3 6.3  HGB 11.3* 11.3* 10.6*  HCT 34.3* 34.6* 33.0*  MCV 90.7 92.5 91.2  PLT 142* 142* 144*    Current Facility-Administered Medications  Medication Dose Route Frequency Provider Last Rate Last Dose  . 0.9 %  sodium chloride infusion  250 mL Intravenous PRN Chrystie Nose, MD      . 0.9 %  sodium chloride infusion  1 mL/kg/hr Intravenous Continuous Chrystie Nose, MD 20 mL/hr at 07/11/13 0600 0.382 mL/kg/hr at 07/11/13 0600  . 0.9 %  sodium chloride infusion   Intravenous Continuous Lennette Bihari, MD 50 mL/hr at 07/11/13 1631 700 mL at 07/11/13 1631  . acetaminophen (TYLENOL) tablet 650 mg  650 mg Oral Q4H PRN Wilburt Finlay, PA-C   650 mg at 07/09/13 2056  . amLODipine (NORVASC) tablet 2.5 mg  2.5 mg Oral Daily Lennette Bihari, MD   2.5 mg at 07/10/13 4098  . aspirin EC tablet 325 mg  325 mg Oral Daily PRN Lennette Bihari, MD      . aspirin EC tablet 81 mg  81 mg Oral Daily Lennette Bihari, MD   81 mg at 07/11/13 0640  . atorvastatin (LIPITOR) tablet 40 mg  40 mg Oral q1800 Wilburt Finlay, PA-C   40 mg at 07/10/13 1731  . calcium acetate (PHOSLO) capsule 1,334 mg  1,334 mg Oral TID WC Lennette Bihari, MD   1,334 mg at 07/10/13 1731  . docusate sodium (COLACE) capsule 100 mg  100 mg Oral PRN Lennette Bihari, MD      . doxercalciferol (HECTOROL) injection 2 mcg  2  mcg Intravenous Q M,W,F-HD Donald Pore, PA-C   2 mcg at 07/09/13 1534  . heparin injection 5,000 Units  5,000 Units Subcutaneous Q8H Lennette Bihari, MD   5,000 Units at 07/11/13 0617  . insulin aspart (novoLOG) injection 0-9 Units  0-9 Units Subcutaneous TID WC Wilburt Finlay, PA-C   1 Units at 07/11/13 1215  . metoprolol tartrate (LOPRESSOR) tablet 25 mg  25 mg Oral BID Abelino Derrick, PA-C   25 mg at 07/10/13 2156  . multivitamin (RENA-VIT) tablet 1 tablet  1 tablet Oral QHS Donald Pore, PA-C   1 tablet at 07/10/13 2156  . nitroGLYCERIN (NITROSTAT) SL tablet 0.4 mg  0.4 mg Sublingual Q5 Min x 3 PRN Wilburt Finlay, PA-C      . ondansetron [facility]) injection 4 mg  4 mg Intravenous Q6H PRN Wilburt Finlay, PA-C   4 mg at 07/09/13 2319  . senna (SENOKOT) tablet 8.6 mg  1 tablet Oral Daily Nada Boozer, NP   8.6 mg at 07/10/13 0951  . sodium chloride 0.9 % injection 3 mL  3 mL Intravenous Q12H Chrystie Nose, MD   3 mL at 07/10/13 2156  . sodium chloride 0.9 % injection 3 mL  3 mL Intravenous PRN  Chrystie Nose, MD

## 2013-07-11 NOTE — Telephone Encounter (Signed)
Went into system to enter heart cath orders for cath scheduled on November 20th. Patient is currently in the hospital. She has been scheduled to get procedure done while she is in the hospital.

## 2013-07-11 NOTE — Progress Notes (Signed)
Patient received from cardiac cath lab with #6 Fr arterial sheath, calibrated, zeroed and to NS pressure bag, #7 venous sheath rt femoral as well with NS at 20cc/hr infusing, rt femoral site unremarkable, receiving Bivalrudin @ 0.25 mcg/kg/min or 2.6cc/hr until 1800 per order, instructed and educated patient and family re lifting head off pillow and moving rt leg until sheath out, Berle Mull RN

## 2013-07-11 NOTE — Progress Notes (Signed)
  Subjective: No complaints. Denies CP/SOB.  Objective: Vital signs in last 24 hours: Temp:  [97.9 F (36.6 C)-98.8 F (37.1 C)] 98.2 F (36.8 C) (11/12 0410) Pulse Rate:  [47-54] 51 (11/12 0700) Resp:  [15-23] 21 (11/12 0700) BP: (122-150)/(31-46) 146/46 mmHg (11/12 0400) SpO2:  [96 %-100 %] 100 % (11/12 0700) Last BM Date: 07/09/13  Intake/Output from previous day: 11/11 0701 - 11/12 0700 In: 978.4 [P.O.:900; I.V.:78.4] Out: -  Intake/Output this shift:    Medications Current Facility-Administered Medications  Medication Dose Route Frequency Provider Last Rate Last Dose  . 0.9 %  sodium chloride infusion  250 mL Intravenous PRN Kenneth C. Hilty, MD      . 0.9 %  sodium chloride infusion  1 mL/kg/hr Intravenous Continuous Kenneth C. Hilty, MD 20 mL/hr at 07/11/13 0600 0.382 mL/kg/hr at 07/11/13 0600  . acetaminophen (TYLENOL) tablet 650 mg  650 mg Oral Q4H PRN Bryan Hager, PA-C   650 mg at 07/09/13 2056  . amLODipine (NORVASC) tablet 2.5 mg  2.5 mg Oral Daily Thomas A Kelly, MD   2.5 mg at 07/10/13 0952  . aspirin EC tablet 325 mg  325 mg Oral Daily PRN Thomas A Kelly, MD      . aspirin EC tablet 81 mg  81 mg Oral Daily Thomas A Kelly, MD   81 mg at 07/11/13 0640  . atorvastatin (LIPITOR) tablet 40 mg  40 mg Oral q1800 Bryan Hager, PA-C   40 mg at 07/10/13 1731  . calcium acetate (PHOSLO) capsule 1,334 mg  1,334 mg Oral TID WC Thomas A Kelly, MD   1,334 mg at 07/10/13 1731  . docusate sodium (COLACE) capsule 100 mg  100 mg Oral PRN Thomas A Kelly, MD      . doxercalciferol (HECTOROL) injection 2 mcg  2 mcg Intravenous Q M,W,F-HD David W Zeyfang, PA-C   2 mcg at 07/09/13 1534  . heparin injection 5,000 Units  5,000 Units Subcutaneous Q8H Thomas A Kelly, MD   5,000 Units at 07/11/13 0617  . insulin aspart (novoLOG) injection 0-9 Units  0-9 Units Subcutaneous TID WC Bryan Hager, PA-C   3 Units at 07/10/13 1324  . metoprolol tartrate (LOPRESSOR) tablet 25 mg  25 mg Oral BID Luke  K Kilroy, PA-C   25 mg at 07/10/13 2156  . multivitamin (RENA-VIT) tablet 1 tablet  1 tablet Oral QHS David W Zeyfang, PA-C   1 tablet at 07/10/13 2156  . nitroGLYCERIN (NITROSTAT) SL tablet 0.4 mg  0.4 mg Sublingual Q5 Min x 3 PRN Bryan Hager, PA-C      . ondansetron (ZOFRAN) injection 4 mg  4 mg Intravenous Q6H PRN Bryan Hager, PA-C   4 mg at 07/09/13 2319  . senna (SENOKOT) tablet 8.6 mg  1 tablet Oral Daily Laura Ingold, NP   8.6 mg at 07/10/13 0951  . sodium chloride 0.9 % injection 3 mL  3 mL Intravenous Q12H Kenneth C. Hilty, MD   3 mL at 07/10/13 2156  . sodium chloride 0.9 % injection 3 mL  3 mL Intravenous PRN Kenneth C. Hilty, MD        PE: General appearance: alert, cooperative and no distress Lungs: clear to auscultation bilaterally Heart: regular rate and rhythm and 3/6 SM Extremities: no LEE Pulses: 2+ and symmetric Skin: warm and dry Neurologic: Grossly normal  Lab Results:  No results found for this basename: WBC, HGB, HCT, PLT,  in the last 72 hours BMET  Recent   Labs  07/09/13 1153  NA 129*  K 4.5  CL 89*  CO2 26  GLUCOSE 111*  BUN 34*  CREATININE 5.30*  CALCIUM 10.1    Assessment/Plan  Principal Problem:   NSTEMI (non-ST elevated myocardial infarction) Active Problems:   End stage renal disease   DM (diabetes mellitus)   HTN (hypertension)   HLD (hyperlipidemia)   COPD (chronic obstructive pulmonary disease)   Aortic stenosis, severe, AVA0.4cm^2, LAst echo 01/2013   PAF (paroxysmal atrial fibrillation)   Volume overload   CAD (coronary artery disease), NSTEMI 2011, CABG X 3 09/2009  Plan:  HSRA and PCI to the RCA by Dr. Kelly today. BP stable. Bradycardic w/ HR in the low 50s. Asymptomatic. ? Holding BB dose prior to procedure. Dr. Kelly to follow.        LOS: 7 days    Brittainy M. Simmons, PA-C 07/11/2013 8:05 AM   Patient seen and examined. Agree with assessment and plan. Discussed complex PCI with patient and family. Plan temporary  pacemaker and HSRA/stenting of severely calcified RCA today. Answered questions. Appreciate Dr. Van Tright's re-assessment.    Thomas A. Kelly, MD, FACC 07/11/2013 12:26 PM  

## 2013-07-11 NOTE — H&P (View-Only) (Signed)
    [address]       [place] [address]             [phone]        [name]  Medical Record #[id number] Date of Birth: 11/11/1940  Referring: No ref. provider found Primary Care: HAGUE, IMRAN P, MD   Patient examined, coronary angiograms and 2-D echocardiogram reviewed  Chief Complaint: Chest pain nausea  Present illness:  72-year-old female active smoker with COPD on chronic hemodialysis presented with chest pain and nausea and rapid atrial fibrillation. This developed while she was on dialysis. She was initially taken to her local hospital and then transferred to [facility]. Her cardiac enzymes were positive with a troponin of 10.6. Her atrial for relation was treated medically and she was placed on IV heparin. Cardiac catheterization was performed. She previously had undergone CABG x3 in 2011--left IMA to LAD and vein grafts to the PDA and circumflex.  Cardiac catheterization at this admit demonstrated patent left IMA to LAD, chronic occlusion of of the native circumflex and the vein graft to the circumflex, occlusion of the saphenous vein graft to the PDA with a very small patent distal PDA too small to re\- graft Right heart catheterization demonstrated pulmonary hypertension. She had a gradient across the aortic valve with a calculated valve area of 1.1. 2-D echocardiogram demonstrated aortic stenosis with calcified aortic valve, mild/moderate mitral regurgitation.. Overall EF is 50% with lateral wall hypokinesia.        Current Activity/ Functional Status: Patient has a sedentary lifestyle at home and has dialysis Monday Wednesday Friday   Zubrod Score: At the time of surgery this patient's most appropriate activity status/level should be described as: [] Normal activity, no symptoms [x] Symptoms, fully ambulatory [] Symptoms, in bed less than or equal to 50% of the time [] Symptoms, in bed greater than 50% of the time but less than  100% [] Bedridden [] Moribund  Past Medical History  Diagnosis Date  . CHF (congestive heart failure)   . Diabetes mellitus   . Chronic kidney disease   . Hyperlipidemia   . Anemia   . Hypertension   . COPD (chronic obstructive pulmonary disease)   . Peripheral vascular disease   . Coronary artery disease   . Hx of echocardiogram 03/2011    showed a mean gradient that had increased to 41 and a peak gradient that had increase to 70, she has moderate tricuspid regurigitation, mild to moderate mitral regurigitation, and significant LA dilation.    Past Surgical History  Procedure Laterality Date  . Coronary artery bypass graft  09/2009    x3 by Dr VanTright with LIMA to the LAD, a vein to the circumflex and a vien to the PDA.  . Abdominal hysterectomy    . Thoracentesis  11-2009  . Av fistula placement  01-12-2008    left upper arm  . Cardiac catheterization  10/15/2009    which showed low normal LV function with mild inferior hypocontractility.    History  Smoking status  . Current Every Day Smoker -- 0.50 packs/day  . Types: Cigarettes  . Start date: 06/19/1973  Smokeless tobacco  . Never Used    History  Alcohol Use No    History   Social History  . Marital Status: Widowed    Spouse Name: N/A    Number of Children: N/A  . Years of Education: N/A   Occupational History  .   Not on file.   Social History Main Topics  . Smoking status: Current Every Day Smoker -- 0.50 packs/day    Types: Cigarettes    Start date: 06/19/1973  . Smokeless tobacco: Never Used  . Alcohol Use: No  . Drug Use: No  . Sexual Activity: No   Other Topics Concern  . Not on file   Social History Narrative  . No narrative on file    Allergies  Allergen Reactions  . Sulfa Antibiotics Other (See Comments)    unknown  . Codeine Rash    Current Facility-Administered Medications  Medication Dose Route Frequency Provider Last Rate Last Dose  . acetaminophen (TYLENOL) tablet 650  mg  650 mg Oral Q4H PRN Bryan Hager, PA-C   650 mg at 07/09/13 0156  . amLODipine (NORVASC) tablet 2.5 mg  2.5 mg Oral Daily Thomas A Kelly, MD   2.5 mg at 07/09/13 0948  . aspirin EC tablet 325 mg  325 mg Oral Daily PRN Thomas A Kelly, MD      . aspirin EC tablet 81 mg  81 mg Oral Daily Thomas A Kelly, MD   81 mg at 07/09/13 0948  . atorvastatin (LIPITOR) tablet 40 mg  40 mg Oral q1800 Bryan Hager, PA-C   40 mg at 07/09/13 1805  . calcium acetate (PHOSLO) capsule 1,334 mg  1,334 mg Oral TID WC Thomas A Kelly, MD   1,334 mg at 07/09/13 1805  . docusate sodium (COLACE) capsule 100 mg  100 mg Oral PRN Thomas A Kelly, MD      . doxercalciferol (HECTOROL) injection 2 mcg  2 mcg Intravenous Q M,W,F-HD David W Zeyfang, PA-C   2 mcg at 07/09/13 1534  . heparin injection 5,000 Units  5,000 Units Subcutaneous Q8H Thomas A Kelly, MD   5,000 Units at 07/09/13 0557  . insulin aspart (novoLOG) injection 0-9 Units  0-9 Units Subcutaneous TID WC Bryan Hager, PA-C   1 Units at 07/08/13 0828  . metoprolol tartrate (LOPRESSOR) tablet 37.5 mg  37.5 mg Oral BID Thomas A Kelly, MD   37.5 mg at 07/09/13 0948  . multivitamin (RENA-VIT) tablet 1 tablet  1 tablet Oral QHS David W Zeyfang, PA-C   1 tablet at 07/08/13 2143  . nitroGLYCERIN (NITROSTAT) SL tablet 0.4 mg  0.4 mg Sublingual Q5 Min x 3 PRN Bryan Hager, PA-C      . ondansetron (ZOFRAN) injection 4 mg  4 mg Intravenous Q6H PRN Bryan Hager, PA-C      . senna (SENOKOT) tablet 8.6 mg  1 tablet Oral Daily Laura Ingold, NP   8.6 mg at 07/08/13 1117    Prescriptions prior to admission  Medication Sig Dispense Refill  . aspirin EC 325 MG tablet Take 325 mg by mouth daily as needed for moderate pain.      . calcium acetate (PHOSLO) 667 MG capsule Take 1,334 mg by mouth 3 (three) times daily with meals.       . insulin NPH-insulin regular (NOVOLIN 70/30) (70-30) 100 UNIT/ML injection Inject 10 Units into the skin daily as needed (when blood sugar is low). As directed per  sliding scale      . [DISCONTINUED] metoprolol tartrate (LOPRESSOR) 25 MG tablet Take 25 mg by mouth 2 (two) times daily.      . acetaminophen (TYLENOL) 500 MG tablet Take 1,000 mg by mouth every 6 (six) hours as needed for moderate pain.      . Aspirin-Acetaminophen-Caffeine (GOODY HEADACHE PO) Take   1 packet by mouth daily as needed (pain).      . docusate sodium (COLACE) 100 MG capsule Take 100 mg by mouth as needed for mild constipation.         Family History  Problem Relation Age of Onset  . Heart disease Mother      Review of Systems:     Cardiac Review of Systems: Y or N  Chest Pain [   Y. ]  Resting SOB [ Y.  ] Exertional SOB  [ Y. ]  Orthopnea [N.  ]   Pedal Edema [Y.   ]    Palpitations [Y.] Syncope  [ and ]   Presyncope [  N. ]  General Review of Systems: [Y] = yes [  ]=no Constitional: recent weight change [  ]; anorexia [  ]; fatigue [  ]; nausea [  ]; night sweats [  ]; fever [  ]; or chills [  ]                                                               Dental: poor dentition[  ]; Last Dentist visit: Greater than one year  Eye : blurred vision [  ]; diplopia [   ]; vision changes [  ];  Amaurosis fugax[  ]; Resp: cough [  ];  wheezing[  ];  hemoptysis[  ]; shortness of breath[ Y. ]; paroxysmal nocturnal dyspnea[  ]; dyspnea on exertion[Y.  ]; or orthopnea[  ];  GI:  gallstones[  ], vomiting[  ];  dysphagia[  ]; melena[  ];  hematochezia [  ]; heartburn[  Y.];   Hx of  Colonoscopy[  ]; GU: kidney stones [  ]; hematuria[  ];   dysuria [  ];  nocturia[  ];  history of     obstruction [  ]; urinary frequency [  ]             Skin: rash, swelling[  ];, hair loss[  ];  peripheral edema[Y.  ];  or itching[  ]; Musculosketetal: myalgias[  ];  joint swelling[  ];  joint erythema[  ];  joint pain[  ];  back pain[  ];  Heme/Lymph: bruising[  ];  bleeding[  ];  anemia[  ];  Neuro: TIA[  ];  headaches[  ];  stroke[  ];  vertigo[  ];  seizures[  ];   paresthesias[  ];  difficulty  walking[  ];  Psych:depression[  ]; anxiety[  ];  Endocrine: diabetes[  ];  thyroid dysfunction[  ];  Immunizations: Flu [  ]; Pneumococcal[  ];  Other:  Physical Exam: BP 157/70  Pulse 56  Temp(Src) 98.1 F (36.7 C) (Oral)  Resp 19  Ht 5' 4" (1.626 m)  Wt 115 lb 4.8 oz (52.3 kg)  BMI 19.78 kg/m2  SpO2 100%  General appearance-elderly female on hemodialysis no distress with audible wheezes HEENT normocephalic dentition adequate Neck-mild JVD no mass no bruit Chest-well-healed sternal incision, scattered bilateral wheezes Cardiac-regular rhythm systolic murmur 3/6 Abdomen-nontender without pulsatile mass Extremities-mild clubbing, atrophic skin changes of the lower extremities with mild pedal edema Neurologic-alert and responsive no focal motor weakness Vascular-functional access for dialysis graft in left arm Diagnostic Studies & Laboratory data:   Cath   films, echocardiogram, x-rays reviewed  Recent Radiology Findings:   No results found.    Recent Lab Findings: Lab Results  Component Value Date   WBC 6.3 07/07/2013   HGB 10.6* 07/07/2013   HCT 33.0* 07/07/2013   PLT 144* 07/07/2013   GLUCOSE 111* 07/09/2013   CHOL 152 07/05/2013   TRIG 47 07/05/2013   HDL 99 07/05/2013   LDLCALC 44 07/05/2013   ALT 34 07/04/2013   AST 41* 07/04/2013   NA 129* 07/09/2013   K 4.5 07/09/2013   CL 89* 07/09/2013   CREATININE 5.30* 07/09/2013   BUN 34* 07/09/2013   CO2 26 07/09/2013   TSH 1.251 07/04/2013   INR 1.12 06/19/2013   HGBA1C 8.5* 07/04/2013      Assessment / Plan:     72-year-old female on chronic dialysis with severe COPD and recent myocardial infarction with positive cardiac enzymes. Coronary arteries are not suitable for re\re grafting and are not adequate targets. 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 I discussed the situation with the patient and  she understands that redo CABG or surgical aVR would not be of benefit to her because of extremely high-risk.        @ME1@ 07/09/2013 6:13 PM        

## 2013-07-11 NOTE — Progress Notes (Signed)
bivalrudin discontinued per order at 1800, rt femoral sheath remains intact venous and arterial, rt femoral level 0, Berle Mull RN

## 2013-07-11 NOTE — Interval H&P Note (Signed)
History and Physical Interval Note:  07/11/2013 2:16 PM  [name]  has presented today for surgery, with the diagnosis of calcified CAD  The various methods of treatment have been discussed with the patient and family. After consideration of risks, benefits and other options for treatment, the patient has consented to  Procedure(s): PERCUTANEOUS CORONARY ROTOBLATOR INTERVENTION (PCI-R) (Left) as a surgical intervention .  The patient's history has been reviewed, patient examined, no change in status, stable for surgery.  I have reviewed the patient's chart and labs.  Questions were answered to the patient's satisfaction.     Truong Delcastillo A

## 2013-07-12 LAB — GLUCOSE, CAPILLARY
Glucose-Capillary: 202 mg/dL — ABNORMAL HIGH (ref 70–99)
Glucose-Capillary: 114 mg/dL — ABNORMAL HIGH (ref 70–99)
Glucose-Capillary: 261 mg/dL — ABNORMAL HIGH (ref 70–99)

## 2013-07-12 LAB — BASIC METABOLIC PANEL
BUN: 27 mg/dL — ABNORMAL HIGH (ref 6–23)
CO2: 24 mEq/L (ref 19–32)
Calcium: 9.3 mg/dL (ref 8.4–10.5)
Chloride: 91 mEq/L — ABNORMAL LOW (ref 96–112)
GFR calc non Af Amer: 7 mL/min — ABNORMAL LOW (ref >90)
Glucose, Bld: 230 mg/dL — ABNORMAL HIGH (ref 70–99)
Potassium: 4.8 mEq/L (ref 3.5–5.1)
Sodium: 130 mEq/L — ABNORMAL LOW (ref 135–145)

## 2013-07-12 LAB — CBC
HCT: 34.2 % — ABNORMAL LOW (ref 36.0–46.0)
Hemoglobin: 10.5 g/dL — ABNORMAL LOW (ref 12.0–15.0)
MCHC: 30.7 g/dL (ref 30.0–36.0)
Platelets: 171 10*3/uL (ref 150–400)
RBC: 3.57 MIL/uL — ABNORMAL LOW (ref 3.87–5.11)

## 2013-07-12 MED ORDER — DOXERCALCIFEROL 4 MCG/2ML IV SOLN
2.0000 ug | INTRAVENOUS | Status: DC
  Filled 2013-07-12: qty 2

## 2013-07-12 MED ORDER — DOXERCALCIFEROL 4 MCG/2ML IV SOLN
INTRAVENOUS | Status: AC
  Administered 2013-07-12: 2 ug via INTRAVENOUS
  Filled 2013-07-12: qty 2

## 2013-07-12 MED ORDER — CLOPIDOGREL BISULFATE 75 MG PO TABS
75.0000 mg | ORAL_TABLET | Freq: Every day | ORAL | Status: DC
  Administered 2013-07-13: 75 mg via ORAL
  Filled 2013-07-12: qty 1

## 2013-07-12 MED ORDER — SENNA 8.6 MG PO TABS
1.0000 | ORAL_TABLET | Freq: Every day | ORAL | Status: DC
  Filled 2013-07-12: qty 1

## 2013-07-12 MED ORDER — AMLODIPINE BESYLATE 2.5 MG PO TABS
2.5000 mg | ORAL_TABLET | Freq: Every day | ORAL | Status: DC
  Administered 2013-07-13: 2.5 mg via ORAL
  Filled 2013-07-12: qty 1

## 2013-07-12 MED ORDER — RENA-VITE PO TABS
1.0000 | ORAL_TABLET | Freq: Every day | ORAL | Status: DC
  Administered 2013-07-12: 1 via ORAL
  Filled 2013-07-12 (×2): qty 1

## 2013-07-12 MED ORDER — NITROGLYCERIN 0.4 MG SL SUBL: 0.4000 mg | SUBLINGUAL_TABLET | SUBLINGUAL | Status: DC | PRN

## 2013-07-12 MED ORDER — INSULIN ASPART 100 UNIT/ML ~~LOC~~ SOLN
0.0000 [IU] | Freq: Three times a day (TID) | SUBCUTANEOUS | Status: DC
  Administered 2013-07-13: 2 [IU] via SUBCUTANEOUS

## 2013-07-12 MED ORDER — CALCIUM ACETATE 667 MG PO CAPS
1334.0000 mg | ORAL_CAPSULE | Freq: Three times a day (TID) | ORAL | Status: DC
  Administered 2013-07-12 – 2013-07-13 (×2): 1334 mg via ORAL
  Filled 2013-07-12 (×5): qty 2

## 2013-07-12 MED ORDER — ACETAMINOPHEN 325 MG PO TABS
ORAL_TABLET | ORAL | Status: AC
  Administered 2013-07-12: 650 mg via ORAL
  Filled 2013-07-12: qty 2

## 2013-07-12 MED ORDER — HEPARIN SODIUM (PORCINE) 5000 UNIT/ML IJ SOLN
5000.0000 [IU] | Freq: Three times a day (TID) | INTRAMUSCULAR | Status: DC
  Administered 2013-07-12 – 2013-07-13 (×2): 5000 [IU] via SUBCUTANEOUS
  Filled 2013-07-12 (×5): qty 1

## 2013-07-12 MED ORDER — ONDANSETRON HCL 4 MG/2ML IJ SOLN: 4.0000 mg | Freq: Four times a day (QID) | INTRAMUSCULAR | Status: DC | PRN

## 2013-07-12 MED ORDER — ATORVASTATIN CALCIUM 40 MG PO TABS
40.0000 mg | ORAL_TABLET | Freq: Every day | ORAL | Status: DC
  Administered 2013-07-12: 40 mg via ORAL
  Filled 2013-07-12 (×2): qty 1

## 2013-07-12 MED ORDER — DOCUSATE SODIUM 100 MG PO CAPS
100.0000 mg | ORAL_CAPSULE | Freq: Every day | ORAL | Status: DC | PRN
  Filled 2013-07-12 (×2): qty 1

## 2013-07-12 MED ORDER — ACETAMINOPHEN 325 MG PO TABS
650.0000 mg | ORAL_TABLET | ORAL | Status: DC | PRN
  Administered 2013-07-12: 650 mg via ORAL
  Filled 2013-07-12: qty 2

## 2013-07-12 MED ORDER — METOPROLOL TARTRATE 25 MG PO TABS
25.0000 mg | ORAL_TABLET | Freq: Two times a day (BID) | ORAL | Status: DC
  Administered 2013-07-12 – 2013-07-13 (×2): 25 mg via ORAL
  Filled 2013-07-12 (×2): qty 1

## 2013-07-12 MED FILL — Sodium Chloride IV Soln 0.9%: INTRAVENOUS | Qty: 50 | Status: AC

## 2013-07-12 NOTE — Progress Notes (Signed)
Band aid applied to Right Groin.

## 2013-07-12 NOTE — Care Management Note (Signed)
    Page 1 of 2   07/12/2013     3:42:20 PM   CARE MANAGEMENT NOTE 07/12/2013  Patient:  [name], [name]   Account Number:  [id number]  Date Initiated:  07/05/2013  Documentation initiated by:  [facility]  Subjective/Objective Assessment:   NSTEMI     Action/Plan:   Anticipated DC Date:  07/13/2013   Anticipated DC Plan:  HOME W HOME HEALTH SERVICES      DC Planning Services  CM consult      [facility] Choice  Resumption Of Svcs/PTA Provider   Choice offered to / List presented to:          [facility] arranged  HH-2 PT      [facility] agency  [facility]   Status of service:  Completed, signed off Medicare Important Message given?   (If response is "NO", the following Medicare IM given date fields will be blank) Date Medicare IM given:   Date Additional Medicare IM given:    Discharge Disposition:  HOME W HOME HEALTH SERVICES  Per UR Regulation:  Reviewed for med. necessity/level of care/duration of stay  If discussed at Long Length of Stay Meetings, dates discussed:   07/10/2013  07/12/2013    Comments:  [place], [name] Mother [phone]                 [name], [name] [phone]  11/13 1540p [name] Cassie Marzano rn,bsn spoke w [name] at [facility] in [place] they provided hhpt for pt. they will need resumption of care orders at disch. they would like orders faxed to [phone], office [phone]. 11/13 1507 [name] Cassie Bennette rn,bsn spoke w pt. she states fam at home to help her. she gets hhpt pta but can't remeber name of agency. pt in [place] will try and find out which hhc agency she has.

## 2013-07-12 NOTE — Progress Notes (Signed)
CARDIAC REHAB PHASE I   PRE:  Rate/Rhythm: 65 SR    BP: sitting 121/23    SaO2: 84 RA, 96 2L  MODE:  Ambulation: 270 ft   POST:  Rate/Rhythm: 76 SR    BP: sitting 140/33     SaO2: 100 2L  Pt to BSC due to incontinence of stool, small amount with mucus. Able to walk assist x2 with RW and 2L O2. Was 84 RA in bed. No major c/o walking. Loss of balance x2 with RW. Sts she does this at home. Would benefit from 1-2 more walks before d/c. Sts her grandson lives with her. To recliner after walk. Left on O2. Will f/u in am. [phone]   Elissa Lovett [facility] CES, ACSM 07/12/2013 2:34 PM

## 2013-07-12 NOTE — Procedures (Signed)
I was present at this dialysis session. I have reviewed the session itself and made appropriate changes.   Pt doing well.  No c/o.    Sabra Heck  MD 07/12/2013, 8:54 AM

## 2013-07-12 NOTE — Progress Notes (Signed)
Subjective: Denies CP/SOB. Denies right groin pain.   Objective: Vital signs in last 24 hours: Temp:  [97.6 F (36.4 C)-98.9 F (37.2 C)] 97.9 F (36.6 C) (11/13 1208) Pulse Rate:  [46-63] 63 (11/13 1300) Resp:  [13-23] 18 (11/13 1300) BP: (104-165)/(29-129) 132/33 mmHg (11/13 1300) SpO2:  [80 %-100 %] 100 % (11/13 1300) Weight:  [118 lb 6.2 oz (53.7 kg)-119 lb 14.9 oz (54.4 kg)] 119 lb 14.9 oz (54.4 kg) (11/13 0819) Last BM Date: 07/10/13  Intake/Output from previous day: 11/12 0701 - 11/13 0700 In: 925.5 [P.O.:280; I.V.:645.5] Out: 0  Intake/Output this shift: Total I/O In: 30 [I.V.:30] Out: 2647 [Other:2647]  Medications Current Facility-Administered Medications  Medication Dose Route Frequency Provider Last Rate Last Dose  . 0.9 %  sodium chloride infusion   Intravenous Continuous Lennette Bihari, MD 30 mL/hr at 07/11/13 2000    . acetaminophen (TYLENOL) tablet 650 mg  650 mg Oral Q4H PRN Wilburt Finlay, PA-C   650 mg at 07/09/13 2056  . acetaminophen (TYLENOL) tablet 650 mg  650 mg Oral Q4H PRN Lennette Bihari, MD   650 mg at 07/12/13 1046  . amLODipine (NORVASC) tablet 2.5 mg  2.5 mg Oral Daily Lennette Bihari, MD   2.5 mg at 07/12/13 0127  . aspirin EC tablet 325 mg  325 mg Oral Daily PRN Lennette Bihari, MD      . aspirin EC tablet 81 mg  81 mg Oral Daily Lennette Bihari, MD   81 mg at 07/11/13 0640  . aspirin EC tablet 81 mg  81 mg Oral Daily Lennette Bihari, MD      . atorvastatin (LIPITOR) tablet 40 mg  40 mg Oral q1800 Wilburt Finlay, PA-C   40 mg at 07/10/13 1731  . calcium acetate (PHOSLO) capsule 1,334 mg  1,334 mg Oral TID WC Lennette Bihari, MD   1,334 mg at 07/10/13 1731  . clopidogrel (PLAVIX) tablet 75 mg  75 mg Oral Q breakfast Lennette Bihari, MD      . docusate sodium (COLACE) capsule 100 mg  100 mg Oral PRN Lennette Bihari, MD      . doxercalciferol (HECTOROL) injection 2 mcg  2 mcg Intravenous Q M,W,F-HD Donald Pore, PA-C   2 mcg at 07/12/13 1030  . heparin  injection 5,000 Units  5,000 Units Subcutaneous Q8H Lennette Bihari, MD   5,000 Units at 07/12/13 0556  . insulin aspart (novoLOG) injection 0-9 Units  0-9 Units Subcutaneous TID WC Wilburt Finlay, PA-C   1 Units at 07/11/13 1215  . metoprolol tartrate (LOPRESSOR) tablet 25 mg  25 mg Oral BID Eda Paschal Kilroy, PA-C   25 mg at 07/12/13 0127  . multivitamin (RENA-VIT) tablet 1 tablet  1 tablet Oral QHS Donald Pore, PA-C   1 tablet at 07/10/13 2156  . nitroGLYCERIN (NITROSTAT) SL tablet 0.4 mg  0.4 mg Sublingual Q5 Min x 3 PRN Wilburt Finlay, PA-C      . ondansetron [facility]) injection 4 mg  4 mg Intravenous Q6H PRN Wilburt Finlay, PA-C   4 mg at 07/09/13 2319  . ondansetron (ZOFRAN) injection 4 mg  4 mg Intravenous Q6H PRN Lennette Bihari, MD      . Gwyndolyn Kaufman [facility]) tablet 8.6 mg  1 tablet Oral Daily Nada Boozer, NP   8.6 mg at 07/10/13 0951   PE: General appearance: alert, cooperative and no distress Lungs: clear to auscultation bilaterally Heart: regular  rate and rhythm and 3/6 AS murmur Extremities: no LEE Pulses: 2+ and symmetric Skin: warm and dry Neurologic: Grossly normal  Lab Results:   Recent Labs  07/12/13 0420  WBC 7.6  HGB 10.5*  HCT 34.2*  PLT 171   BMET  Recent Labs  07/11/13 1700 07/12/13 0420  NA 128* 130*  K 4.6 4.8  CL 90* 91*  CO2 25 24  GLUCOSE 147* 230*  BUN 24* 27*  CREATININE 4.88* 5.37*  CALCIUM 9.3 9.3     Assessment/Plan  Principal Problem:   NSTEMI (non-ST elevated myocardial infarction) Active Problems:   End stage renal disease   DM (diabetes mellitus)   HTN (hypertension)   HLD (hyperlipidemia)   COPD (chronic obstructive pulmonary disease)   Aortic stenosis, severe, AVA0.4cm^2, LAst echo 01/2013   PAF (paroxysmal atrial fibrillation)   Volume overload   CAD (coronary artery disease), NSTEMI 2011, CABG X 3 09/2009  Plan: Day 1 s/p HSRA of severely calcified RCA by Dr. Tresa Endo. CV procedure note pending. Pt denies CP. Right groin stable. Vitals  stable. HD completed earlier today. Will consult PT to help ambulate. Possible d/c tomorrow.     LOS: 8 days    Brittainy M. Sharol Harness, PA-C 07/12/2013 1:27 PM  I have seen and examined the patient along with Brittainy M. Sharol Harness, PA-C.  I have reviewed the chart, notes and new data.  I agree with PA's note.  Key new complaints: feels well, no dyspnea or chest pain; has not been out  Key examination changes: no groin complications, loud AS murmur, early to mid peaking   PLAN: Ambulate with PT prior to discharge. She is usually a MWF HD patient, but had dialysis today. Need to review with Nephrology how we will manage HD until her next scheduled session on Monday.  Thurmon Fair, MD, [facility] [phone] 07/12/2013, 1:37 PM

## 2013-07-12 NOTE — Progress Notes (Signed)
I spoke with Dr. Hyman Hopes in regards to HD. He has instructed for the patient to resume HD on Monday 07/16/13.

## 2013-07-12 NOTE — Progress Notes (Signed)
Sheath to right groin pulled by this nurse with [facility] supervising. Patient informed of sheath pull prior to pulling. Site was a level 1 prior to removing with minimal bleeding around site.  Manual pressure to arterial sheath held for 20 minutes, and manual pressure to venous sheath held for 10 minutes. Vital signs stable throughout procedure. Right distal pulses +2 throughout procedure. Right femoral pulse +2 throughout procedure. Pressure dressing applied post procedure. Patient educated to hold pressure to dressing if she needs to cough, sneeze, laugh, or pass gas. Patient informed that she will need to keep leg straight for 4 hours, and to call the nurse if she feels any drainage from site. Patient voices understanding. Site currently level 0 with no bleeding/hematoma noted.

## 2013-07-12 NOTE — Progress Notes (Signed)
Report given to HD.  VS stable. Right groin level 0.  No distress noted.  HD will transport via bed and 02 at 2 l.[place] .  Emilie [place] Liter

## 2013-07-13 DIAGNOSIS — F17200 Nicotine dependence, unspecified, uncomplicated: Secondary | ICD-10-CM | POA: Diagnosis present

## 2013-07-13 MED ORDER — METOPROLOL TARTRATE 25 MG PO TABS: 25.0000 mg | ORAL_TABLET | Freq: Two times a day (BID) | ORAL | Status: DC

## 2013-07-13 MED ORDER — RENA-VITE PO TABS: 1.0000 | ORAL_TABLET | Freq: Every day | ORAL | Status: DC

## 2013-07-13 MED ORDER — ASPIRIN 81 MG PO CHEW: 81.0000 mg | CHEWABLE_TABLET | Freq: Every day | ORAL | Status: DC

## 2013-07-13 MED ORDER — NITROGLYCERIN 0.4 MG SL SUBL: 0.4000 mg | SUBLINGUAL_TABLET | SUBLINGUAL | Status: DC | PRN

## 2013-07-13 MED ORDER — AMLODIPINE BESYLATE 2.5 MG PO TABS: 2.5000 mg | ORAL_TABLET | Freq: Every day | ORAL | Status: DC

## 2013-07-13 MED ORDER — ATORVASTATIN CALCIUM 40 MG PO TABS: 40.0000 mg | ORAL_TABLET | Freq: Every day | ORAL | Status: DC

## 2013-07-13 MED ORDER — CLOPIDOGREL BISULFATE 75 MG PO TABS: 75.0000 mg | ORAL_TABLET | Freq: Every day | ORAL | Status: DC

## 2013-07-13 NOTE — Discharge Summary (Signed)
Patient ID: [name],  MRN: [id number], DOB/AGE: [birth year] 72 y.o.  Admit date: 07/04/2013 Discharge date: 07/13/2013  Primary Care Provider: Dr Angelina Pih Primary Cardiologist: Dr Tresa Endo   Discharge Diagnoses Principal Problem:   NSTEMI 07/04/13 Active Problems:   CAD- CABG X 3 09/2009, HSRA/DES to native RCA 07/11/13   End stage renal disease- HD MWF in [place]   DM (diabetes mellitus)   Aortic stenosis, moderate at cath 07/11/13   PAF (paroxysmal atrial fibrillation)   HTN (hypertension)   HLD (hyperlipidemia)   COPD (chronic obstructive pulmonary disease)   Smoker    Procedures: Cardiac Cath 07/06/13                         RCA HSRA 07/11/13  Hospital Course: 72 year old female active smoker with COPD, CAD s/p CABG X 3 in 2011 with an left IMA to LAD and vein grafts to the PDA and circumflex. She has at least moderate AS by echo. She is  on chronic hemodialysis MWF in [place]. She developed chest pain and nausea and rapid atrial fibrillation while on dialysis 07/04/13. She was [facility] taken to [facility] and then transferred to [facility]. Her cardiac enzymes were positive with a troponin of 10.6. Her atrial fibrilation was treated medically and she converted to NSR. She was placed on IV heparin and nitrates. Cardiac catheterization was performed 07/06/13 by Dr Tresa Endo. This demonstrated a patent left IMA to LAD, chronic occlusion of of the native circumflex and the vein graft to the circumflex, and occlusion of the saphenous vein graft to the PDA with a very small patent distal PDA too small to re- graft,  Right heart catheterization demonstrated pulmonary hypertension. She had a gradient across the aortic valve with a calculated valve area of 1.1. 2-D echocardiogram demonstrated aortic stenosis with calcified aortic valve, mild/moderate mitral regurgitation.. Her overall EF was 50% with lateral wall hypokinesia.         She was seen in consult by Dr Alla German who felt  she was not a candidate for surgical intervention. It was felt her AS was moderate, as opposed to critical. She underwent elective native RCA HSRA by Dr Tresa Endo on 08/10/13 (note pending).  She tolerated this well and we feel she can be discharged 07/13/13. She will follow up with a MLP in one week and then Dr Tresa Endo in 4-6 weeks.    Discharge Vitals:  Blood pressure 143/41, pulse 58, temperature 98.4 F (36.9 C), temperature source Oral, resp. rate 20, height 5\' 4"  (1.626 m), weight 113 lb 1.5 oz (51.3 kg), SpO2 95.00%.    Labs: Results for orders placed during the hospital encounter of 07/04/13 (from the past 48 hour(s))  GLUCOSE, CAPILLARY     Status: Abnormal   Collection Time    07/11/13 12:07 PM      Result Value Range   Glucose-Capillary 140 (*) 70 - 99 mg/dL  POCT ACTIVATED CLOTTING TIME     Status: None   Collection Time    07/11/13  2:24 PM      Result Value Range   Activated Clotting Time 345    GLUCOSE, CAPILLARY     Status: Abnormal   Collection Time    07/11/13  4:42 PM      Result Value Range   Glucose-Capillary 119 (*) 70 - 99 mg/dL  BASIC METABOLIC PANEL     Status: Abnormal   Collection Time    07/11/13  5:00  PM      Result Value Range   Sodium 128 (*) 135 - 145 mEq/L   Potassium 4.6  3.5 - 5.1 mEq/L   Chloride 90 (*) 96 - 112 mEq/L   CO2 25  19 - 32 mEq/L   Glucose, Bld 147 (*) 70 - 99 mg/dL   BUN 24 (*) 6 - 23 mg/dL   Creatinine, Ser 6.96 (*) 0.50 - 1.10 mg/dL   Calcium 9.3  8.4 - 29.5 mg/dL   GFR calc non Af Amer 8 (*) >90 mL/min   GFR calc Af Amer 10 (*) >90 mL/min   Comment: (NOTE)     The eGFR has been calculated using the CKD EPI equation.     This calculation has not been validated in all clinical situations.     eGFR's persistently <90 mL/min signify possible Chronic Kidney     Disease.  POCT ACTIVATED CLOTTING TIME     Status: None   Collection Time    07/11/13  8:42 PM      Result Value Range   Activated Clotting Time 196    GLUCOSE,  CAPILLARY     Status: Abnormal   Collection Time    07/11/13  9:51 PM      Result Value Range   Glucose-Capillary 150 (*) 70 - 99 mg/dL  POCT ACTIVATED CLOTTING TIME     Status: None   Collection Time    07/11/13  9:53 PM      Result Value Range   Activated Clotting Time 186    POCT ACTIVATED CLOTTING TIME     Status: None   Collection Time    07/11/13 11:49 PM      Result Value Range   Activated Clotting Time 176    POCT ACTIVATED CLOTTING TIME     Status: None   Collection Time    07/12/13 12:25 AM      Result Value Range   Activated Clotting Time 160    CBC     Status: Abnormal   Collection Time    07/12/13  4:20 AM      Result Value Range   WBC 7.6  4.0 - 10.5 K/uL   RBC 3.57 (*) 3.87 - 5.11 MIL/uL   Hemoglobin 10.5 (*) 12.0 - 15.0 g/dL   HCT 28.4 (*) 13.2 - 44.0 %   MCV 95.8  78.0 - 100.0 fL   MCH 29.4  26.0 - 34.0 pg   MCHC 30.7  30.0 - 36.0 g/dL   RDW 10.2 (*) 72.5 - 36.6 %   Platelets 171  150 - 400 K/uL  BASIC METABOLIC PANEL     Status: Abnormal   Collection Time    07/12/13  4:20 AM      Result Value Range   Sodium 130 (*) 135 - 145 mEq/L   Potassium 4.8  3.5 - 5.1 mEq/L   Chloride 91 (*) 96 - 112 mEq/L   CO2 24  19 - 32 mEq/L   Glucose, Bld 230 (*) 70 - 99 mg/dL   BUN 27 (*) 6 - 23 mg/dL   Creatinine, Ser 4.40 (*) 0.50 - 1.10 mg/dL   Calcium 9.3  8.4 - 34.7 mg/dL   GFR calc non Af Amer 7 (*) >90 mL/min   GFR calc Af Amer 8 (*) >90 mL/min   Comment: (NOTE)     The eGFR has been calculated using the CKD EPI equation.     This calculation has not been  validated in all clinical situations.     eGFR's persistently <90 mL/min signify possible Chronic Kidney     Disease.  GLUCOSE, CAPILLARY     Status: Abnormal   Collection Time    07/12/13  7:57 AM      Result Value Range   Glucose-Capillary 202 (*) 70 - 99 mg/dL   Comment 1 Notify RN    GLUCOSE, CAPILLARY     Status: Abnormal   Collection Time    07/12/13  1:23 PM      Result Value Range    Glucose-Capillary 114 (*) 70 - 99 mg/dL   Comment 1 Notify RN    GLUCOSE, CAPILLARY     Status: Abnormal   Collection Time    07/12/13  4:57 PM      Result Value Range   Glucose-Capillary 166 (*) 70 - 99 mg/dL   Comment 1 Notify RN    GLUCOSE, CAPILLARY     Status: Abnormal   Collection Time    07/12/13  9:46 PM      Result Value Range   Glucose-Capillary 261 (*) 70 - 99 mg/dL   Comment 1 Documented in Chart     Comment 2 Notify RN    GLUCOSE, CAPILLARY     Status: Abnormal   Collection Time    07/13/13  7:55 AM      Result Value Range   Glucose-Capillary 152 (*) 70 - 99 mg/dL    Disposition:  Follow-up Information   Follow up with Wilburt Finlay, PA-C On 07/19/2013. (2pm)    Specialty:  Physician Assistant   Contact information:   [address] [place] [address] [phone]       Discharge Medications:    Medication List    STOP taking these medications       aspirin EC 325 MG tablet  Replaced by:  aspirin 81 MG chewable tablet     GOODY HEADACHE PO      TAKE these medications       acetaminophen 500 MG tablet  Commonly known as:  TYLENOL  Take 1,000 mg by mouth every 6 (six) hours as needed for moderate pain.     amLODipine 2.5 MG tablet  Commonly known as:  NORVASC  Take 1 tablet (2.5 mg total) by mouth daily.     aspirin 81 MG chewable tablet  Chew 1 tablet (81 mg total) by mouth daily.     atorvastatin 40 MG tablet  Commonly known as:  LIPITOR  Take 1 tablet (40 mg total) by mouth daily at 6 PM.     calcium acetate 667 MG capsule  Commonly known as:  PHOSLO  Take 1,334 mg by mouth 3 (three) times daily with meals.     clopidogrel 75 MG tablet  Commonly known as:  PLAVIX  Take 1 tablet (75 mg total) by mouth daily with breakfast.     docusate sodium 100 MG capsule  Commonly known as:  COLACE  Take 100 mg by mouth as needed for mild constipation.     insulin NPH-regular (70-30) 100 UNIT/ML injection  Commonly known as:   NOVOLIN 70/30  Inject 10 Units into the skin daily as needed (when blood sugar is low). As directed per sliding scale     metoprolol tartrate 25 MG tablet  Commonly known as:  LOPRESSOR  Take 1 tablet (25 mg total) by mouth 2 (two) times daily.     multivitamin Tabs tablet  Take 1  tablet by mouth at bedtime.     nitroGLYCERIN 0.4 MG SL tablet  Commonly known as:  NITROSTAT  Place 1 tablet (0.4 mg total) under the tongue every 5 (five) minutes x 3 doses as needed for chest pain.         Duration of Discharge Encounter: Greater than 30 minutes including physician time.  Jolene Provost PA-C 07/13/2013 10:43 AM

## 2013-07-13 NOTE — Progress Notes (Signed)
Subjective:  Her O2 sat dropped with ambulation to 85 but recovered at rest. She was asymptomatic.  Objective:  Vital Signs in the last 24 hours: Temp:  [97.5 F (36.4 C)-99.1 F (37.3 C)] 98.4 F (36.9 C) (11/14 0756) Pulse Rate:  [25-114] 58 (11/14 0700) Resp:  [13-24] 20 (11/14 0700) BP: (108-145)/(33-60) 143/41 mmHg (11/14 0756) SpO2:  [90 %-100 %] 95 % (11/14 0756) Weight:  [113 lb 1.5 oz (51.3 kg)] 113 lb 1.5 oz (51.3 kg) (11/14 0200)  Intake/Output from previous day:  Intake/Output Summary (Last 24 hours) at 07/13/13 0836 Last data filed at 07/12/13 2200  Gross per 24 hour  Intake    680 ml  Output   2647 ml  Net  -1967 ml   . amLODipine  2.5 mg Oral Daily  . aspirin  81 mg Oral Daily  . atorvastatin  40 mg Oral q1800  . calcium acetate  1,334 mg Oral TID WC  . clopidogrel  75 mg Oral Q breakfast  . doxercalciferol  2 mcg Intravenous Q M,W,F-HD  . heparin  5,000 Units Subcutaneous Q8H  . insulin aspart  0-9 Units Subcutaneous TID WC  . metoprolol tartrate  25 mg Oral BID  . multivitamin  1 tablet Oral QHS  . senna  1 tablet Oral Daily    Physical Exam: General appearance: alert, cooperative, no distress and frail No JVD Lungs: few basilar rales Heart: regular rate and rhythm and 2/6 systolic murmur ABD: soft, BS+ Groin site stable No edema   Rate: 64  Rhythm: normal sinus rhythm  Lab Results:  Recent Labs  07/12/13 0420  WBC 7.6  HGB 10.5*  PLT 171    Recent Labs  07/11/13 1700 07/12/13 0420  NA 128* 130*  K 4.6 4.8  CL 90* 91*  CO2 25 24  GLUCOSE 147* 230*  BUN 24* 27*  CREATININE 4.88* 5.37*   No results found for this basename: TROPONINI, CK, MB,  in the last 72 hours No results found for this basename: INR,  in the last 72 hours  Imaging: Imaging results have been reviewed  Cardiac Studies:  Assessment/Plan:   Principal Problem:   NSTEMI (non-ST elevated myocardial infarction) Active Problems:   End stage renal disease  DM (diabetes mellitus)   HTN (hypertension)   HLD (hyperlipidemia)   COPD (chronic obstructive pulmonary disease)   Aortic stenosis, severe, AVA0.4cm^2, LAst echo 01/2013   PAF (paroxysmal atrial fibrillation)   Volume overload   CAD (coronary artery disease), NSTEMI 2011, CABG X 3 09/2009    PLAN: Discharge-add ASA 81 mg- F/U with Dr Tresa Endo.  Maintaining NSR. Day 2 s/p HSRA/cutting balloon/stenting with 76 mm DES Promus stents. On 81 ASA/Plavix. Total OM branch of Lcx/ AS. Will dc later today and f/u in office in 1 - 2 weeks.  Corine Shelter PA-C Beeper [phone] 07/13/2013, 8:36 AM

## 2013-07-13 NOTE — Progress Notes (Signed)
Reviewed discharge instructions with patient and family. Verbally understood. IV's removed and patient transported out via wheelchair.  Alven Alverio, Charlaine Dalton RN

## 2013-07-13 NOTE — Progress Notes (Signed)
CARDIAC REHAB PHASE I   PRE:  Rate/Rhythm: 65SR  BP:  Supine:   Sitting: 143/41  Standing:    SaO2: 89-90%RA  MODE:  Ambulation: 170 ft   POST:  Rate/Rhythm: 74  BP:  Supine:   Sitting: 142/36  Standing:    SaO2: 85%RA after walk but quickly to 92% 0819-0900 When entering room, pt eating something from bag in plastic white bag that looked like white flour. White powdery substance on mouth and gown. Pt stated that it was good. Asked her what it is and she said I'm not telling you. She started closing bag tightly so it could not be seen. Told PA when he came to assess that thought pt ate some white flour. Walked 170 ft on RA with rolling walker and asst x 2. Did not want to walk farther as she said she was hungry and breakfast tray had arrived. Desat on RA but quickly to 92% with short rest. No DOE. To recliner with call bell. Gave MI booklet and smoking cessation handouts. Not appropriate for CRP 2 due to dialysis days and limited mobility. No c/o CP.   Luetta Nutting, RN BSN  07/13/2013 8:52 AM

## 2013-07-19 ENCOUNTER — Encounter: Payer: Self-pay | Admitting: Physician Assistant

## 2013-07-19 ENCOUNTER — Encounter (HOSPITAL_COMMUNITY): Admission: RE | Payer: Self-pay | Source: Ambulatory Visit

## 2013-07-19 ENCOUNTER — Ambulatory Visit (INDEPENDENT_AMBULATORY_CARE_PROVIDER_SITE_OTHER): Payer: Self-pay | Admitting: Physician Assistant

## 2013-07-19 ENCOUNTER — Ambulatory Visit (HOSPITAL_COMMUNITY): Admission: RE | Admit: 2013-07-19 | Payer: Medicare Other | Source: Ambulatory Visit | Admitting: Cardiovascular Disease

## 2013-07-19 VITALS — BP 150/56 | HR 64 | Ht 64.0 in | Wt 119.4 lb

## 2013-07-19 DIAGNOSIS — E8770 Fluid overload, unspecified: Secondary | ICD-10-CM

## 2013-07-19 DIAGNOSIS — E8779 Other fluid overload: Secondary | ICD-10-CM

## 2013-07-19 DIAGNOSIS — I4891 Unspecified atrial fibrillation: Secondary | ICD-10-CM

## 2013-07-19 DIAGNOSIS — I2510 Atherosclerotic heart disease of native coronary artery without angina pectoris: Secondary | ICD-10-CM

## 2013-07-19 DIAGNOSIS — I10 Essential (primary) hypertension: Secondary | ICD-10-CM

## 2013-07-19 DIAGNOSIS — M7989 Other specified soft tissue disorders: Secondary | ICD-10-CM

## 2013-07-19 DIAGNOSIS — E785 Hyperlipidemia, unspecified: Secondary | ICD-10-CM

## 2013-07-19 DIAGNOSIS — N186 End stage renal disease: Secondary | ICD-10-CM

## 2013-07-19 DIAGNOSIS — E119 Type 2 diabetes mellitus without complications: Secondary | ICD-10-CM

## 2013-07-19 DIAGNOSIS — I480 Paroxysmal atrial fibrillation: Secondary | ICD-10-CM

## 2013-07-19 SURGERY — LEFT HEART CATHETERIZATION WITH CORONARY/GRAFT ANGIOGRAM
Anesthesia: LOCAL

## 2013-07-19 NOTE — Patient Instructions (Signed)
Wear compression socks during the day and take them off at night.  Follow up with Dr. Tresa Endo in 3 months

## 2013-07-19 NOTE — CV Procedure (Addendum)
[name] is a 72 y.o. female    [id number]  [id number] LOCATION:  FACILITY: [facility]  PHYSICIAN: Lennette Bihari, MD, [facility] 05-04-1941   DATE OF PROCEDURE:  07/11/2013     COMPLEX PCI: HSRA/CUTTING BALLOON/PTCA/STENT/TEMPORARY PACEMAKER     HISTORY:  [name] is a 72 year old African American female with end-stage renal disease who suffered a non ST segment elevation MI in 2001 and underwent CABG surgery with LIMA to the LAD, vein to the circumflex, and vein to the PDA. She did have mild aortic valve stenosis at that time. She recently has progressed to severe aortic stenosis. She has developed recurrent episodes of chest tightness during dialysis. She recently presented to: Hospital after developing chest pain during dialysis and had atrial fibrillation. She ruled in for non-ST segment elevation myocardial infarction. On 07/07/2003 teens she underwent cardiac catheterization which revealed a diffusely calcified severely diseased right coronary artery with an occluded vein graft that supplied the PDA, and occluded vein graft which supplied the obtuse marginal branch of the circumflex vessel and a patent LIMA to the LAD. She also had moderate aortic valve stenosis. She was turned down for reconsideration of repeat surgery. She now presents for complex percutaneous coronary intervention with planned high-speed rotational atherectomy of her severely calcified right coronary artery.   PROCEDURE:  The patient was brought to the second floor [place] [facility] lab in the postabsorptive state. Versed 1 mg and fentanyl 25 mcg were initially administered for conscious sedation. Right femoral artery was punctured anteriorly and a 6 French arterial sheath was inserted. The right femoral vein was punctured anteriorly and a venous sheath was inserted. A transvenous temporary pacemaker was then advanced to the RV apex with demonstration of excellent capture and thresholds. A 6 [place] FR4 guide  was inserted. The patient received aspirin and 300 mg of Plavix. Angiomax bolus plus infusion was administered and ACT was documented to be therapeutic. A Rotafloppy wire was able to be advanced into the right coronary artery and ultimately was able to cross all 3 lesions. Initially, a 1.5 mm Roto burr was platformed outside the body at 175,000 revolutions per minute. The burr was able to advanced into the RCA and diffuse high-speed rotational atherectomy of the proximal, mid, and mid-distal area was undertaken. There was significant slowing of the burr speed when initially attempting to cross the 99% stenotic most distal  lesion but ultimately this was successful. The burr was then successfully   dynaglided out. A 2.5x6 mm [employer] cutting balloon was then inserted and several dilatations were made at several sites. This was then exchanged for a 3.0x10 mm cutting balloon and several inflations were again made at multiple sites. An Emerge Monorail 3.5x20 mm balloon was then inserted and low-level inflations were made at all sites which had undergone high-speed rotational atherectomy. This was then removed and exchanged for an initial Promus Premier 3.0x38 mm DES stent.  Tthe stent was ultimately able to be advanced to the distal RCA and positioned proximal to the PDA takeoff and extended to the mid RCA. Marland Kitchen A second 3.0x38 mm Promus Premier DES stent was then inserted with stent overlap at the mid segment extending proximally. There was now 76 mm of stent in the RCA.  A 3.5x20 mm [place] Quantum balloon was used for post stent dilatation throughout the entire stented region. However, there still was significant calcium  at the mid site and there was still a residual waist despite high-pressure  dilatation. A 3.75x8 mm noncompliant Quantum was then inserted for focal high-pressure dilatation up to 19 atmospheres at this site. This did improve the residual waist but again there was still some mild residual  bowing at the site but there was excellent stent apposition. The procedure was a long procedure but ultimately successful. At the completion of the procedure, the temporary pacemaker which had been utilized throughout a majority of the procedure was removed the patient was bradycardic with pulse in the 50s and was chest pain-free with stable hemodynamics.   HEMODYNAMICS:   Central Aorta: 115/50   ANGIOGRAPHY:  At the start of the procedure, there was severe calcification of a diffusely diseased right coronary artery. It was a 60% proximal stenosis. There was dense calcification in the mid vessel with 90% stenosis and in the region of the acute margin stenosis was 99%. Initially, the distal RCA was small caliber and supplied several small branches. Following extensive high-speed rotational atherectomy, Cutting Balloon, low-level balloon dilatation at all sites had undergone rotablation, and ultimate tandem stenting with 3.0x38 mm DES stents postdilated to 3.5 mm with the focal mid segment being dilated up to 19 atmospheres with a 3.75x8 mm noncompliant balloon, there was marked improvement in vessel size with most sites being reduced to 0%. There was still mild residual waist the mid level which was severely calcified but there did appear to be excellent stent apposition. This site was postdilated to 3.89 mm. At the completion of the procedure, the distal right coronary artery was a moderate size vessel and gave rise to significantly improved flow down the PDA PLA and inferior LV branches.  IMPRESSION:  Difficult but successful complex percutaneous coronary intervention of a severely calcified diffusely diseased right coronary artery with temporary pacemaker insertion, High-Speed Rotational Atherectomy, Cutting Balloon atherotomy, PTCA, and tandem stenting with two 3.0x38 mm DES stents with all sites being postdilated to 3.5 mm but with a residual mid RCA site with significant residual extrinsic calcium  being postdilated to 3.89 mm.   Lennette Bihari, MD, [facility] 07/19/2013 7:12 PM

## 2013-07-19 NOTE — Assessment & Plan Note (Signed)
Currently treated with Lipitor 40 mg. 

## 2013-07-19 NOTE — Assessment & Plan Note (Signed)
Currently maintaining a regular rhythm based on exam.

## 2013-07-19 NOTE — Assessment & Plan Note (Signed)
Blood pressure is mildly elevated at this time. She may need some volume taken off dialysis.

## 2013-07-19 NOTE — Progress Notes (Signed)
Date:  07/19/2013   ID:  [name], DOB [birth year], MRN [id number]  PCP:  Galvin Proffer, MD  Primary Cardiologist:  Tresa Endo     History of Present Illness: [name] is a 72 y.o. female 72 year old female active smoker with COPD, CAD s/p CABG X 3 in 2011 with an left IMA to LAD and vein grafts to the PDA and circumflex. She has at least moderate AS by echo. She is on chronic hemodialysis MWF in [place]. She developed chest pain and nausea and rapid atrial fibrillation while on dialysis 07/04/13. She was initially taken to [facility] and then transferred to [facility]. Her cardiac enzymes were positive with a troponin of 10.6. Her atrial fibrilation was treated medically and she converted to NSR. She was placed on IV heparin and nitrates. Cardiac catheterization was performed 07/06/13 by Dr Tresa Endo. This demonstrated a patent left IMA to LAD, chronic occlusion of of the native circumflex and the vein graft to the circumflex, and occlusion of the saphenous vein graft to the PDA with a very small patent distal PDA too small to re- graft, Right heart catheterization demonstrated pulmonary hypertension. She had a gradient across the aortic valve with a calculated valve area of 1.1. 2-D echocardiogram demonstrated aortic stenosis with calcified aortic valve, mild/moderate mitral regurgitation.. Her overall EF was 50% with lateral wall hypokinesia.  She was seen in consult by Dr Alla German who felt she was not a candidate for surgical intervention. It was felt her AS was moderate, as opposed to critical. She underwent elective native RCA HSRA by Dr Tresa Endo on 08/10/13 (note pending). She tolerated this well and we feel she can be discharged 07/13/13.   The patient presents for post hospital evaluation. He reports doing quite well and other than her bowels not moving as frequently as she would like she currently currently denies nausea, vomiting, fever, chest pain, shortness of breath, orthopnea,  dizziness, PND, cough, congestion, abdominal pain, hematochezia, melena, lower extremity edema, claudication.  Wt Readings from Last 3 Encounters:  07/19/13 119 lb 6.4 oz (54.159 kg)  07/13/13 113 lb 1.5 oz (51.3 kg)  07/13/13 113 lb 1.5 oz (51.3 kg)     Past Medical History  Diagnosis Date  . CHF (congestive heart failure)   . Diabetes mellitus   . Chronic kidney disease   . Hyperlipidemia   . Anemia   . Hypertension   . COPD (chronic obstructive pulmonary disease)   . Peripheral vascular disease   . Coronary artery disease   . Hx of echocardiogram 03/2011    showed a mean gradient that had increased to 41 and a peak gradient that had increase to 70, she has moderate tricuspid regurigitation, mild to moderate mitral regurigitation, and significant LA dilation.    Current Outpatient Prescriptions  Medication Sig Dispense Refill  . acetaminophen (TYLENOL) 500 MG tablet Take 1,000 mg by mouth every 6 (six) hours as needed for moderate pain.      Marland Kitchen amLODipine (NORVASC) 2.5 MG tablet Take 1 tablet (2.5 mg total) by mouth daily.  90 tablet  3  . aspirin 81 MG chewable tablet Chew 1 tablet (81 mg total) by mouth daily.      Marland Kitchen atorvastatin (LIPITOR) 40 MG tablet Take 1 tablet (40 mg total) by mouth daily at 6 PM.  90 tablet  3  . calcium acetate (PHOSLO) 667 MG capsule Take 1,334 mg by mouth 3 (three) times daily with meals.       Marland Kitchen  clopidogrel (PLAVIX) 75 MG tablet Take 1 tablet (75 mg total) by mouth daily with breakfast.  90 tablet  3  . docusate sodium (COLACE) 100 MG capsule Take 100 mg by mouth as needed for mild constipation.       . insulin NPH-insulin regular (NOVOLIN 70/30) (70-30) 100 UNIT/ML injection Inject 10 Units into the skin daily as needed (when blood sugar is low). As directed per sliding scale      . metoprolol tartrate (LOPRESSOR) 25 MG tablet Take 1 tablet (25 mg total) by mouth 2 (two) times daily.  180 tablet  3  . multivitamin (RENA-VIT) TABS tablet Take 1 tablet  by mouth at bedtime.  90 tablet  3  . nitroGLYCERIN (NITROSTAT) 0.4 MG SL tablet Place 1 tablet (0.4 mg total) under the tongue every 5 (five) minutes x 3 doses as needed for chest pain.  25 tablet  2   No current facility-administered medications for this visit.    Allergies:    Allergies  Allergen Reactions  . Sulfa Antibiotics Other (See Comments)    unknown  . Codeine Rash    Social History:  The patient  reports that she has been smoking Cigarettes.  She started smoking about 40 years ago. She has been smoking about 0.50 packs per day. She has never used smokeless tobacco. She reports that she does not drink alcohol or use illicit drugs.   Family history:   Family History  Problem Relation Age of Onset  . Heart disease Mother     ROS:  Please see the history of present illness.  All other systems reviewed and negative.   PHYSICAL EXAM: VS:  BP 150/56  Pulse 64  Ht 5\' 4"  (1.626 m)  Wt 119 lb 6.4 oz (54.159 kg)  BMI 20.48 kg/m2 Well nourished, well developed, in no acute distress HEENT: Pupils are equal round react to light accommodation extraocular movements are intact.  Neck: Positive JVDNo cervical lymphadenopathy. Cardiac: Regular rate and rhythm with 3/6 systolic murmur. No rubs or gallops. Lungs:  clear to auscultation bilaterally, no wheezing, rhonchi or rales Abd: soft, nontender, positive bowel sounds all quadrants, no hepatosplenomegaly Ext:  1+  lower extremity edema.  2+ radial and dorsalis pedis pulses. Skin: warm and dry Neuro:  Grossly normal    ASSESSMENT AND PLAN:  Problem List Items Addressed This Visit   Volume overload     Excess volume likely related to her aortic stenosis, grade 3 diastolic dysfunction and mild systolic reduction.  I am also going to recommend compression socks be worn daily.    PAF (paroxysmal atrial fibrillation)     Currently maintaining a regular rhythm based on exam.    HTN (hypertension) (Chronic)     Blood pressure  is mildly elevated at this time. She may need some volume taken off dialysis.    HLD (hyperlipidemia) (Chronic)     Currently treated with Lipitor 40 mg    End stage renal disease- HD MWF in [place] - Primary (Chronic)   DM (diabetes mellitus) (Chronic)   CAD- CABG X 3 09/2009, HSRA/DES to native RCA 07/11/13 (Chronic)

## 2013-07-19 NOTE — Assessment & Plan Note (Signed)
Excess volume likely related to her aortic stenosis, grade 3 diastolic dysfunction and mild systolic reduction.  I am also going to recommend compression socks be worn daily.

## 2013-07-20 ENCOUNTER — Ambulatory Visit: Payer: Medicare Other | Admitting: Physician Assistant

## 2013-07-23 ENCOUNTER — Emergency Department (HOSPITAL_COMMUNITY)
Admission: EM | Admit: 2013-07-23 | Discharge: 2013-07-23 | Disposition: A | Discharge status: Home / Self Care | Payer: Medicare Other | Attending: Emergency Medicine | Admitting: Emergency Medicine

## 2013-07-23 ENCOUNTER — Encounter (HOSPITAL_COMMUNITY): Payer: Self-pay | Admitting: Emergency Medicine

## 2013-07-23 ENCOUNTER — Emergency Department (HOSPITAL_COMMUNITY): Payer: Medicare Other

## 2013-07-23 DIAGNOSIS — N186 End stage renal disease: Secondary | ICD-10-CM | POA: Insufficient documentation

## 2013-07-23 DIAGNOSIS — Z95818 Presence of other cardiac implants and grafts: Secondary | ICD-10-CM | POA: Insufficient documentation

## 2013-07-23 DIAGNOSIS — Z862 Personal history of diseases of the blood and blood-forming organs and certain disorders involving the immune mechanism: Secondary | ICD-10-CM | POA: Insufficient documentation

## 2013-07-23 DIAGNOSIS — E785 Hyperlipidemia, unspecified: Secondary | ICD-10-CM | POA: Insufficient documentation

## 2013-07-23 DIAGNOSIS — I120 Hypertensive chronic kidney disease with stage 5 chronic kidney disease or end stage renal disease: Secondary | ICD-10-CM | POA: Insufficient documentation

## 2013-07-23 DIAGNOSIS — R079 Chest pain, unspecified: Secondary | ICD-10-CM | POA: Insufficient documentation

## 2013-07-23 DIAGNOSIS — Z951 Presence of aortocoronary bypass graft: Secondary | ICD-10-CM | POA: Insufficient documentation

## 2013-07-23 DIAGNOSIS — J4489 Other specified chronic obstructive pulmonary disease: Secondary | ICD-10-CM | POA: Insufficient documentation

## 2013-07-23 DIAGNOSIS — Z7902 Long term (current) use of antithrombotics/antiplatelets: Secondary | ICD-10-CM | POA: Insufficient documentation

## 2013-07-23 DIAGNOSIS — J449 Chronic obstructive pulmonary disease, unspecified: Secondary | ICD-10-CM | POA: Insufficient documentation

## 2013-07-23 DIAGNOSIS — Z992 Dependence on renal dialysis: Secondary | ICD-10-CM | POA: Insufficient documentation

## 2013-07-23 DIAGNOSIS — F17200 Nicotine dependence, unspecified, uncomplicated: Secondary | ICD-10-CM | POA: Insufficient documentation

## 2013-07-23 DIAGNOSIS — E119 Type 2 diabetes mellitus without complications: Secondary | ICD-10-CM | POA: Insufficient documentation

## 2013-07-23 DIAGNOSIS — I509 Heart failure, unspecified: Secondary | ICD-10-CM | POA: Insufficient documentation

## 2013-07-23 DIAGNOSIS — I739 Peripheral vascular disease, unspecified: Secondary | ICD-10-CM | POA: Insufficient documentation

## 2013-07-23 DIAGNOSIS — Z79899 Other long term (current) drug therapy: Secondary | ICD-10-CM | POA: Insufficient documentation

## 2013-07-23 DIAGNOSIS — R609 Edema, unspecified: Secondary | ICD-10-CM | POA: Insufficient documentation

## 2013-07-23 DIAGNOSIS — Z7982 Long term (current) use of aspirin: Secondary | ICD-10-CM | POA: Insufficient documentation

## 2013-07-23 DIAGNOSIS — Z794 Long term (current) use of insulin: Secondary | ICD-10-CM | POA: Insufficient documentation

## 2013-07-23 LAB — CBC WITH DIFFERENTIAL/PLATELET
Basophils Absolute: 0 10*3/uL (ref 0.0–0.1)
Basophils Relative: 0 % (ref 0–1)
Eosinophils Absolute: 1 10*3/uL — ABNORMAL HIGH (ref 0.0–0.7)
Eosinophils Relative: 13 % — ABNORMAL HIGH (ref 0–5)
HCT: 31.5 % — ABNORMAL LOW (ref 36.0–46.0)
Hemoglobin: 10.7 g/dL — ABNORMAL LOW (ref 12.0–15.0)
Lymphocytes Relative: 18 % (ref 12–46)
Lymphs Abs: 1.3 10*3/uL (ref 0.7–4.0)
MCH: 30.9 pg (ref 26.0–34.0)
MCHC: 34 g/dL (ref 30.0–36.0)
MCV: 91 fL (ref 78.0–100.0)
Monocytes Absolute: 0.7 10*3/uL (ref 0.1–1.0)
Monocytes Relative: 9 % (ref 3–12)
Neutro Abs: 4.5 10*3/uL (ref 1.7–7.7)
Neutrophils Relative %: 60 % (ref 43–77)
Platelets: 209 10*3/uL (ref 150–400)
RBC: 3.46 MIL/uL — ABNORMAL LOW (ref 3.87–5.11)
RDW: 16.4 % — ABNORMAL HIGH (ref 11.5–15.5)
WBC: 7.5 10*3/uL (ref 4.0–10.5)

## 2013-07-23 LAB — BASIC METABOLIC PANEL
BUN: 9 mg/dL (ref 6–23)
CO2: 33 mEq/L — ABNORMAL HIGH (ref 19–32)
Calcium: 8.9 mg/dL (ref 8.4–10.5)
Chloride: 99 mEq/L (ref 96–112)
Creatinine, Ser: 2.27 mg/dL — ABNORMAL HIGH (ref 0.50–1.10)
GFR calc Af Amer: 24 mL/min — ABNORMAL LOW (ref >90)
GFR calc non Af Amer: 21 mL/min — ABNORMAL LOW (ref >90)
Glucose, Bld: 108 mg/dL — ABNORMAL HIGH (ref 70–99)
Potassium: 2.5 mEq/L — CL (ref 3.5–5.1)
Sodium: 142 mEq/L (ref 135–145)

## 2013-07-23 LAB — TROPONIN I: Troponin I: <0.3 ng/mL (ref <0.30)

## 2013-07-23 MED ORDER — POTASSIUM CHLORIDE CRYS ER 20 MEQ PO TBCR
20.0000 meq | EXTENDED_RELEASE_TABLET | Freq: Once | ORAL | Status: AC
  Administered 2013-07-23: 20 meq via ORAL
  Filled 2013-07-23: qty 1

## 2013-07-23 NOTE — ED Notes (Signed)
Patient is alert and orientedx4.  Patient was explained discharge instructions and they understood them with no questions.  Dorothy Spark, the patient's son is taking the patient home.

## 2013-07-23 NOTE — ED Notes (Signed)
IV team called en route to deaccess fistula.

## 2013-07-23 NOTE — ED Notes (Signed)
Onset an hour ago at Dialysis center they called EMS and she was brought to the ED.  Patient was given 324mg  of Aspirin and 1 Nitro and her chest pain resolved.  She rated it 5/10.

## 2013-07-23 NOTE — ED Provider Notes (Signed)
CSN: [id number]     Arrival date & time 07/23/13  1125 History   First MD Initiated Contact with Patient 07/23/13 1240     Chief Complaint  Patient presents with  . Chest Pain    Onset an hour ago at Dialysis center they called EMS and she was brought to the ED.  Patient was given 324mg  of Aspirin and 1 Nitro and her chest pain resolved.  She rated it 5/10   (Consider location/radiation/quality/duration/timing/severity/associated sxs/prior Treatment) HPI  72yf with CP. Onset shortly before arrival. Pt with hard time describing it. "It just hurt." Currently resolved. Went away in route to ED. Think it last about an hour total. Denies associated SOB, palpitations, n/v, diaphoresis. Pain did not radiate. No fever or chills. Finished her dialysis session. Felt fine earlier today. No unusual leg pain or swelling.  Past Medical History  Diagnosis Date  . CHF (congestive heart failure)   . Diabetes mellitus   . Chronic kidney disease   . Hyperlipidemia   . Anemia   . Hypertension   . COPD (chronic obstructive pulmonary disease)   . Peripheral vascular disease   . Coronary artery disease   . Hx of echocardiogram 03/2011    showed a mean gradient that had increased to 41 and a peak gradient that had increase to 70, she has moderate tricuspid regurigitation, mild to moderate mitral regurigitation, and significant LA dilation.   Past Surgical History  Procedure Laterality Date  . Coronary artery bypass graft  09/2009    x3 by Dr Alla German with LIMA to the LAD, a vein to the circumflex and a vien to the PDA.  Marland Kitchen Abdominal hysterectomy    . Thoracentesis  11-2009  . Av fistula placement  01-12-2008    left upper arm  . Cardiac catheterization  10/15/2009    which showed low normal LV function with mild inferior hypocontractility.   Family History  Problem Relation Age of Onset  . Heart disease Mother    History  Substance Use Topics  . Smoking status: Current Every Day Smoker -- 0.50  packs/day    Types: Cigarettes    Start date: 06/19/1973  . Smokeless tobacco: Never Used  . Alcohol Use: No   OB History   Grav Para Term Preterm Abortions TAB SAB Ect Mult Living                 Review of Systems  All systems reviewed and negative, other than as noted in HPI.   Allergies  Sulfa antibiotics and Codeine  Home Medications   Current Outpatient Rx  Name  Route  Sig  Dispense  Refill  . acetaminophen (TYLENOL) 500 MG tablet   Oral   Take 1,000 mg by mouth every 6 (six) hours as needed for moderate pain.         Marland Kitchen amLODipine (NORVASC) 2.5 MG tablet   Oral   Take 1 tablet (2.5 mg total) by mouth daily.   90 tablet   3   . aspirin 81 MG chewable tablet   Oral   Chew 1 tablet (81 mg total) by mouth daily.         Marland Kitchen atorvastatin (LIPITOR) 40 MG tablet   Oral   Take 1 tablet (40 mg total) by mouth daily at 6 PM.   90 tablet   3   . clopidogrel (PLAVIX) 75 MG tablet   Oral   Take 1 tablet (75 mg total) by mouth daily with  breakfast.   90 tablet   3   . docusate sodium (COLACE) 100 MG capsule   Oral   Take 100 mg by mouth as needed for mild constipation.          . insulin NPH-insulin regular (NOVOLIN 70/30) (70-30) 100 UNIT/ML injection   Subcutaneous   Inject 10 Units into the skin daily as needed (when blood sugar is low). As directed per sliding scale         . metoprolol tartrate (LOPRESSOR) 25 MG tablet   Oral   Take 1 tablet (25 mg total) by mouth 2 (two) times daily.   180 tablet   3   . multivitamin (RENA-VIT) TABS tablet   Oral   Take 1 tablet by mouth at bedtime.   90 tablet   3   . nitroGLYCERIN (NITROSTAT) 0.4 MG SL tablet   Sublingual   Place 1 tablet (0.4 mg total) under the tongue every 5 (five) minutes x 3 doses as needed for chest pain.   25 tablet   2    BP 113/41  Pulse 64  Temp(Src) 98.5 F (36.9 C) (Oral)  Resp 20  SpO2 100% Physical Exam  Nursing note and vitals reviewed. Constitutional: She  appears well-developed and well-nourished. No distress.  HENT:  Head: Normocephalic and atraumatic.  Eyes: Conjunctivae are normal. Right eye exhibits no discharge. Left eye exhibits no discharge.  Neck: Neck supple.  Cardiovascular: Normal rate, regular rhythm and normal heart sounds.  Exam reveals no gallop and no friction rub.   No murmur heard. Pulmonary/Chest: Effort normal and breath sounds normal. No respiratory distress.  Abdominal: Soft. She exhibits no distension. There is no tenderness.  Musculoskeletal: She exhibits no edema and no tenderness.  Symmetric pitting LE edema. No calf tenderness.   Neurological: She is alert.  Skin: Skin is warm and dry. She is not diaphoretic.  Psychiatric: She has a normal mood and affect. Her behavior is normal. Thought content normal.    ED Course  Procedures (including critical care time) Labs Review Labs Reviewed  CBC WITH DIFFERENTIAL - Abnormal; Notable for the following:    RBC 3.46 (*)    Hemoglobin 10.7 (*)    HCT 31.5 (*)    RDW 16.4 (*)    Eosinophils Relative 13 (*)    Eosinophils Absolute 1.0 (*)    All other components within normal limits  BASIC METABOLIC PANEL - Abnormal; Notable for the following:    Potassium 2.5 (*)    CO2 33 (*)    Glucose, Bld 108 (*)    Creatinine, Ser 2.27 (*)    GFR calc non Af Amer 21 (*)    GFR calc Af Amer 24 (*)    All other components within normal limits  TROPONIN I   Imaging Review Dg Chest 2 View  07/23/2013   CLINICAL DATA:  Chest pain.  EXAM: CHEST  2 VIEW  COMPARISON:  July 04, 2013.  FINDINGS: The cardiac silhouette remains enlarged. The pulmonary vascularity is more distinct but is prominent centrally. There remain coarse lung markings in the right perihilar and infrahilar region but density more peripherally in the right mid lung has resolved. The patient has undergone previous CABG. There is no significant pleural fluid collection. There is no pneumothorax nor  pneumomediastinum. The observed portions of the bony thorax exhibit no acute abnormality.  IMPRESSION: Since the previous study there has been improvement in the appearance of the right lung but there is  persistent atelectasis or pneumonia in the right infrahilar region. Low-grade compensated CHF is suspected as well.   Electronically Signed   By: David  [place]   On: 07/23/2013 12:46    EKG Interpretation    Date/Time:  Monday July 23 2013 11:31:07 EST Ventricular Rate:  64 PR Interval:  203 QRS Duration: 175 QT Interval:  531 QTC Calculation: 548 R Axis:   162 Text Interpretation:  Sinus rhythm Atrial premature complex Right bundle branch block resolution of lateral ST  changes  from last EKG from 07/12/2013 Confirmed by Gianny Sabino  MD, Nasreen Goedecke ([id number]) on 07/23/2013 11:46:57 AM            MDM   1. Chest pain       Raeford Razor, MD 07/24/13 [phone]

## 2013-08-22 ENCOUNTER — Encounter: Payer: Self-pay | Admitting: Cardiovascular Disease

## 2013-09-11 ENCOUNTER — Ambulatory Visit (INDEPENDENT_AMBULATORY_CARE_PROVIDER_SITE_OTHER): Payer: Medicare Other | Admitting: Cardiovascular Disease

## 2013-09-11 ENCOUNTER — Encounter: Payer: Self-pay | Admitting: Cardiovascular Disease

## 2013-09-11 VITALS — BP 140/60 | HR 81 | Ht 64.0 in | Wt 123.8 lb

## 2013-09-11 DIAGNOSIS — I480 Paroxysmal atrial fibrillation: Secondary | ICD-10-CM

## 2013-09-11 DIAGNOSIS — I10 Essential (primary) hypertension: Secondary | ICD-10-CM

## 2013-09-11 DIAGNOSIS — E785 Hyperlipidemia, unspecified: Secondary | ICD-10-CM

## 2013-09-11 DIAGNOSIS — I4891 Unspecified atrial fibrillation: Secondary | ICD-10-CM

## 2013-09-11 DIAGNOSIS — I2510 Atherosclerotic heart disease of native coronary artery without angina pectoris: Secondary | ICD-10-CM

## 2013-09-11 DIAGNOSIS — R079 Chest pain, unspecified: Secondary | ICD-10-CM

## 2013-09-11 DIAGNOSIS — E119 Type 2 diabetes mellitus without complications: Secondary | ICD-10-CM

## 2013-09-11 DIAGNOSIS — I359 Nonrheumatic aortic valve disorder, unspecified: Secondary | ICD-10-CM

## 2013-09-11 DIAGNOSIS — I350 Nonrheumatic aortic (valve) stenosis: Secondary | ICD-10-CM

## 2013-09-11 DIAGNOSIS — N186 End stage renal disease: Secondary | ICD-10-CM

## 2013-09-11 DIAGNOSIS — I214 Non-ST elevation (NSTEMI) myocardial infarction: Secondary | ICD-10-CM

## 2013-09-11 NOTE — Patient Instructions (Signed)
Your physician has recommended you make the following change in your medication: STOP THE AMLODIPINE !!!  ( IF NOT DONE SO ALREADY)  Your physician recommends that you schedule a follow-up appointment in: 6 months.

## 2013-10-06 ENCOUNTER — Inpatient Hospital Stay (HOSPITAL_COMMUNITY): Payer: Medicare Other

## 2013-10-06 ENCOUNTER — Inpatient Hospital Stay (HOSPITAL_COMMUNITY)
Admission: EM | Admit: 2013-10-06 | Discharge: 2013-10-09 | DRG: 280 | Disposition: A | Discharge status: Home / Self Care | Payer: Medicare Other | Source: Other Acute Inpatient Hospital | Attending: Internal Medicine | Admitting: Internal Medicine

## 2013-10-06 ENCOUNTER — Encounter (HOSPITAL_COMMUNITY): Payer: Self-pay | Admitting: Internal Medicine

## 2013-10-06 ENCOUNTER — Encounter: Payer: Self-pay | Admitting: Cardiovascular Disease

## 2013-10-06 DIAGNOSIS — I120 Hypertensive chronic kidney disease with stage 5 chronic kidney disease or end stage renal disease: Secondary | ICD-10-CM | POA: Diagnosis present

## 2013-10-06 DIAGNOSIS — Z992 Dependence on renal dialysis: Secondary | ICD-10-CM

## 2013-10-06 DIAGNOSIS — E785 Hyperlipidemia, unspecified: Secondary | ICD-10-CM

## 2013-10-06 DIAGNOSIS — Z794 Long term (current) use of insulin: Secondary | ICD-10-CM

## 2013-10-06 DIAGNOSIS — I10 Essential (primary) hypertension: Secondary | ICD-10-CM

## 2013-10-06 DIAGNOSIS — F17200 Nicotine dependence, unspecified, uncomplicated: Secondary | ICD-10-CM | POA: Diagnosis present

## 2013-10-06 DIAGNOSIS — I4510 Unspecified right bundle-branch block: Secondary | ICD-10-CM | POA: Diagnosis present

## 2013-10-06 DIAGNOSIS — N2581 Secondary hyperparathyroidism of renal origin: Secondary | ICD-10-CM | POA: Diagnosis present

## 2013-10-06 DIAGNOSIS — I4891 Unspecified atrial fibrillation: Secondary | ICD-10-CM | POA: Diagnosis present

## 2013-10-06 DIAGNOSIS — I509 Heart failure, unspecified: Secondary | ICD-10-CM | POA: Diagnosis present

## 2013-10-06 DIAGNOSIS — Z9861 Coronary angioplasty status: Secondary | ICD-10-CM

## 2013-10-06 DIAGNOSIS — I359 Nonrheumatic aortic valve disorder, unspecified: Secondary | ICD-10-CM

## 2013-10-06 DIAGNOSIS — E1129 Type 2 diabetes mellitus with other diabetic kidney complication: Secondary | ICD-10-CM | POA: Diagnosis present

## 2013-10-06 DIAGNOSIS — Z7902 Long term (current) use of antithrombotics/antiplatelets: Secondary | ICD-10-CM

## 2013-10-06 DIAGNOSIS — M949 Disorder of cartilage, unspecified: Secondary | ICD-10-CM

## 2013-10-06 DIAGNOSIS — Z79899 Other long term (current) drug therapy: Secondary | ICD-10-CM

## 2013-10-06 DIAGNOSIS — K649 Unspecified hemorrhoids: Secondary | ICD-10-CM | POA: Diagnosis present

## 2013-10-06 DIAGNOSIS — Z7982 Long term (current) use of aspirin: Secondary | ICD-10-CM

## 2013-10-06 DIAGNOSIS — I480 Paroxysmal atrial fibrillation: Secondary | ICD-10-CM

## 2013-10-06 DIAGNOSIS — I739 Peripheral vascular disease, unspecified: Secondary | ICD-10-CM | POA: Diagnosis present

## 2013-10-06 DIAGNOSIS — I2789 Other specified pulmonary heart diseases: Secondary | ICD-10-CM | POA: Diagnosis present

## 2013-10-06 DIAGNOSIS — I495 Sick sinus syndrome: Secondary | ICD-10-CM

## 2013-10-06 DIAGNOSIS — I2510 Atherosclerotic heart disease of native coronary artery without angina pectoris: Secondary | ICD-10-CM

## 2013-10-06 DIAGNOSIS — J449 Chronic obstructive pulmonary disease, unspecified: Secondary | ICD-10-CM

## 2013-10-06 DIAGNOSIS — E119 Type 2 diabetes mellitus without complications: Secondary | ICD-10-CM

## 2013-10-06 DIAGNOSIS — N186 End stage renal disease: Secondary | ICD-10-CM

## 2013-10-06 DIAGNOSIS — I214 Non-ST elevation (NSTEMI) myocardial infarction: Principal | ICD-10-CM

## 2013-10-06 DIAGNOSIS — I25810 Atherosclerosis of coronary artery bypass graft(s) without angina pectoris: Secondary | ICD-10-CM | POA: Diagnosis present

## 2013-10-06 DIAGNOSIS — M899 Disorder of bone, unspecified: Secondary | ICD-10-CM | POA: Diagnosis present

## 2013-10-06 DIAGNOSIS — Z8249 Family history of ischemic heart disease and other diseases of the circulatory system: Secondary | ICD-10-CM

## 2013-10-06 DIAGNOSIS — D649 Anemia, unspecified: Secondary | ICD-10-CM | POA: Diagnosis present

## 2013-10-06 DIAGNOSIS — I5032 Chronic diastolic (congestive) heart failure: Secondary | ICD-10-CM | POA: Diagnosis present

## 2013-10-06 DIAGNOSIS — J4489 Other specified chronic obstructive pulmonary disease: Secondary | ICD-10-CM | POA: Diagnosis present

## 2013-10-06 DIAGNOSIS — I252 Old myocardial infarction: Secondary | ICD-10-CM

## 2013-10-06 DIAGNOSIS — R0602 Shortness of breath: Secondary | ICD-10-CM | POA: Diagnosis present

## 2013-10-06 DIAGNOSIS — I080 Rheumatic disorders of both mitral and aortic valves: Secondary | ICD-10-CM | POA: Diagnosis present

## 2013-10-06 DIAGNOSIS — I350 Nonrheumatic aortic (valve) stenosis: Secondary | ICD-10-CM

## 2013-10-06 DIAGNOSIS — R079 Chest pain, unspecified: Secondary | ICD-10-CM

## 2013-10-06 DIAGNOSIS — I4892 Unspecified atrial flutter: Secondary | ICD-10-CM

## 2013-10-06 LAB — COMPREHENSIVE METABOLIC PANEL
ALBUMIN: 3.4 g/dL — AB (ref 3.5–5.2)
ALK PHOS: 154 U/L — AB (ref 39–117)
ALT: 21 U/L (ref 0–35)
AST: 45 U/L — ABNORMAL HIGH (ref 0–37)
BUN: 11 mg/dL (ref 6–23)
CO2: 26 meq/L (ref 19–32)
CREATININE: 2.98 mg/dL — AB (ref 0.50–1.10)
Calcium: 8.9 mg/dL (ref 8.4–10.5)
Chloride: 96 mEq/L (ref 96–112)
GFR calc Af Amer: 17 mL/min — ABNORMAL LOW (ref >90)
GFR, EST NON AFRICAN AMERICAN: 15 mL/min — AB (ref >90)
Glucose, Bld: 207 mg/dL — ABNORMAL HIGH (ref 70–99)
POTASSIUM: 3.8 meq/L (ref 3.7–5.3)
SODIUM: 138 meq/L (ref 137–147)
Total Protein: 7 g/dL (ref 6.0–8.3)

## 2013-10-06 LAB — HEMOGLOBIN A1C
Hgb A1c MFr Bld: 8.5 % — ABNORMAL HIGH (ref <5.7)
Mean Plasma Glucose: 197 mg/dL — ABNORMAL HIGH (ref <117)

## 2013-10-06 LAB — CBC WITH DIFFERENTIAL/PLATELET
BASOS ABS: 0.1 10*3/uL (ref 0.0–0.1)
BASOS PCT: 1 % (ref 0–1)
Eosinophils Absolute: 0.3 10*3/uL (ref 0.0–0.7)
Eosinophils Relative: 5 % (ref 0–5)
HCT: 33.4 % — ABNORMAL LOW (ref 36.0–46.0)
Hemoglobin: 10.6 g/dL — ABNORMAL LOW (ref 12.0–15.0)
LYMPHS PCT: 23 % (ref 12–46)
Lymphs Abs: 1.3 10*3/uL (ref 0.7–4.0)
MCH: 30.9 pg (ref 26.0–34.0)
MCHC: 31.7 g/dL (ref 30.0–36.0)
MCV: 97.4 fL (ref 78.0–100.0)
MONO ABS: 0.9 10*3/uL (ref 0.1–1.0)
Monocytes Relative: 16 % — ABNORMAL HIGH (ref 3–12)
NEUTROS ABS: 3.2 10*3/uL (ref 1.7–7.7)
NEUTROS PCT: 56 % (ref 43–77)
Platelets: 203 10*3/uL (ref 150–400)
RBC: 3.43 MIL/uL — ABNORMAL LOW (ref 3.87–5.11)
RDW: 16.7 % — AB (ref 11.5–15.5)
WBC: 5.8 10*3/uL (ref 4.0–10.5)

## 2013-10-06 LAB — TROPONIN I
Troponin I: 10.42 ng/mL (ref <0.30)
Troponin I: 7.1 ng/mL (ref <0.30)
Troponin I: 3.93 ng/mL (ref <0.30)

## 2013-10-06 LAB — LIPID PANEL
Cholesterol: 122 mg/dL (ref 0–200)
HDL: 89 mg/dL (ref >39)
LDL Cholesterol: 26 mg/dL (ref 0–99)
Total CHOL/HDL Ratio: 1.4 RATIO
Triglycerides: 36 mg/dL (ref <150)
VLDL: 7 mg/dL (ref 0–40)

## 2013-10-06 LAB — HEPARIN LEVEL (UNFRACTIONATED)

## 2013-10-06 LAB — MAGNESIUM: Magnesium: 2 mg/dL (ref 1.5–2.5)

## 2013-10-06 LAB — PROTIME-INR
INR: 1.25 (ref 0.00–1.49)
Prothrombin Time: 15.4 seconds — ABNORMAL HIGH (ref 11.6–15.2)

## 2013-10-06 LAB — MRSA PCR SCREENING: MRSA BY PCR: NEGATIVE

## 2013-10-06 LAB — GLUCOSE, CAPILLARY
Glucose-Capillary: 123 mg/dL — ABNORMAL HIGH (ref 70–99)
Glucose-Capillary: 131 mg/dL — ABNORMAL HIGH (ref 70–99)
Glucose-Capillary: 139 mg/dL — ABNORMAL HIGH (ref 70–99)

## 2013-10-06 MED ORDER — HEPARIN (PORCINE) IN NACL 100-0.45 UNIT/ML-% IJ SOLN
800.0000 [IU]/h | INTRAMUSCULAR | Status: DC
  Administered 2013-10-06: 650 [IU]/h via INTRAVENOUS
  Administered 2013-10-07: 750 [IU]/h via INTRAVENOUS
  Filled 2013-10-06 (×4): qty 250

## 2013-10-06 MED ORDER — ONDANSETRON HCL 4 MG PO TABS
4.0000 mg | ORAL_TABLET | Freq: Four times a day (QID) | ORAL | Status: DC | PRN
  Filled 2013-10-06: qty 1

## 2013-10-06 MED ORDER — ASPIRIN 81 MG PO CHEW
81.0000 mg | CHEWABLE_TABLET | Freq: Every day | ORAL | Status: DC
  Administered 2013-10-06 – 2013-10-09 (×3): 81 mg via ORAL
  Filled 2013-10-06 (×3): qty 1

## 2013-10-06 MED ORDER — SODIUM CHLORIDE 0.9 % IV SOLN
INTRAVENOUS | Status: DC
  Administered 2013-10-06: 10 mL/h via INTRAVENOUS

## 2013-10-06 MED ORDER — SODIUM CHLORIDE 0.9 % IJ SOLN
3.0000 mL | Freq: Two times a day (BID) | INTRAMUSCULAR | Status: DC
  Administered 2013-10-06 – 2013-10-09 (×4): 3 mL via INTRAVENOUS

## 2013-10-06 MED ORDER — RENA-VITE PO TABS
1.0000 | ORAL_TABLET | Freq: Every day | ORAL | Status: DC
  Administered 2013-10-06 – 2013-10-08 (×3): 1 via ORAL
  Filled 2013-10-06 (×4): qty 1

## 2013-10-06 MED ORDER — DOCUSATE SODIUM 100 MG PO CAPS
100.0000 mg | ORAL_CAPSULE | ORAL | Status: DC | PRN
  Filled 2013-10-06: qty 1

## 2013-10-06 MED ORDER — INSULIN ASPART 100 UNIT/ML ~~LOC~~ SOLN
0.0000 [IU] | Freq: Three times a day (TID) | SUBCUTANEOUS | Status: DC
  Administered 2013-10-06: 1 [IU] via SUBCUTANEOUS
  Administered 2013-10-06: 2 [IU] via SUBCUTANEOUS
  Administered 2013-10-06: 1 [IU] via SUBCUTANEOUS
  Administered 2013-10-07: 2 [IU] via SUBCUTANEOUS
  Administered 2013-10-07: 3 [IU] via SUBCUTANEOUS
  Administered 2013-10-07: 1 [IU] via SUBCUTANEOUS

## 2013-10-06 MED ORDER — CALCIUM ACETATE 667 MG PO CAPS
1334.0000 mg | ORAL_CAPSULE | Freq: Three times a day (TID) | ORAL | Status: DC
  Administered 2013-10-06 – 2013-10-09 (×7): 1334 mg via ORAL
  Filled 2013-10-06 (×12): qty 2

## 2013-10-06 MED ORDER — INSULIN ASPART 100 UNIT/ML ~~LOC~~ SOLN: 0.0000 [IU] | Freq: Every day | SUBCUTANEOUS | Status: DC

## 2013-10-06 MED ORDER — ACETAMINOPHEN 325 MG PO TABS
650.0000 mg | ORAL_TABLET | Freq: Four times a day (QID) | ORAL | Status: DC | PRN
  Administered 2013-10-06 – 2013-10-07 (×2): 650 mg via ORAL
  Filled 2013-10-06 (×2): qty 2

## 2013-10-06 MED ORDER — AMIODARONE HCL 200 MG PO TABS
200.0000 mg | ORAL_TABLET | Freq: Two times a day (BID) | ORAL | Status: DC
  Administered 2013-10-06 – 2013-10-09 (×7): 200 mg via ORAL
  Filled 2013-10-06 (×8): qty 1

## 2013-10-06 MED ORDER — ATORVASTATIN CALCIUM 40 MG PO TABS
40.0000 mg | ORAL_TABLET | Freq: Every day | ORAL | Status: DC
  Administered 2013-10-06 – 2013-10-07 (×2): 40 mg via ORAL
  Filled 2013-10-06 (×4): qty 1

## 2013-10-06 MED ORDER — ONDANSETRON HCL 4 MG/2ML IJ SOLN
4.0000 mg | Freq: Four times a day (QID) | INTRAMUSCULAR | Status: DC | PRN
Start: 2013-10-06 — End: 2013-10-09
  Filled 2013-10-06: qty 2

## 2013-10-06 MED ORDER — CLOPIDOGREL BISULFATE 75 MG PO TABS
75.0000 mg | ORAL_TABLET | Freq: Every day | ORAL | Status: DC
  Administered 2013-10-06 – 2013-10-09 (×4): 75 mg via ORAL
  Filled 2013-10-06 (×6): qty 1

## 2013-10-06 MED ORDER — ACETAMINOPHEN 650 MG RE SUPP: 650.0000 mg | Freq: Four times a day (QID) | RECTAL | Status: DC | PRN

## 2013-10-06 NOTE — Consult Note (Signed)
Admit date: 10/06/2013 Referring Physician  Dr. Betti Cruz Primary Physician  Dr. Karna Christmas Primary Cardiologist  Dr. Aleen Campi Reason for Consultation  NSTEMI  HPI:73 year old female active smoker with COPD, CAD s/p CABG X 3 in 2011 with an LIMA to LAD and vein grafts to the PDA and circumflex. She has at least moderate AS by echo. She is on chronic hemodialysis MWF in [place]. She developed chest pain and nausea and rapid atrial fibrillation while on dialysis 07/04/13. She was initially taken to [facility] and then transferred to [facility]. Her cardiac enzymes were positive with a troponin of 10.6. Her atrial fibrilation was treated medically and she converted to NSR. She was placed on IV heparin and nitrates. Cardiac catheterization was performed 07/06/13 by Dr Tresa Endo. This demonstrated a patent LMA to LAD, chronic occlusion of of the native circumflex and the vein graft to the circumflex, and occlusion of the saphenous vein graft to the PDA with a very small patent distal PDA too small to re- graft, Right heart catheterization demonstrated pulmonary hypertension. She had a gradient across the aortic valve with a calculated valve area of 1.1. 2-D echocardiogram demonstrated aortic stenosis with calcified aortic valve, mild/moderate mitral regurgitation.. Her overall EF was 50% with lateral wall hypokinesia.  She was seen in consult by Dr Alla German who felt she was not a candidate for surgical intervention. It was felt her AS was moderate, as opposed to critical. She underwent elective native RCA HSRA by Dr Tresa Endo on 08/10/13 (note pending).   Apparently had HD yesterday and became nauseated and SOB.  Patient is a very poor historian so cannot get an accurate history.  Per the ER MD EMS was called from HD and in the ER she was in atrial tachycardia vs atrial flutter briefly with RVR and spontaneously converted to sinus bradycardia.  She had some chest pain shortly after her HD which radiated to her  back and chest and left arm.  She has not had any since then.  She was found to have elevated troponins and was transferred to [facility].  Cardiology is now asked to consult.  On arrival on unit she was in sinus bradycardia and then went into WCT at a rate of 151bpm and appeared to be SVT vs. Atrial flutter with 2:1 block.  She converted spontaneously with a long pause followed by sinus bradycardia.   PMH:   Past Medical History  Diagnosis Date  . CHF (congestive heart failure)   . Diabetes mellitus   . Chronic kidney disease   . Hyperlipidemia   . Anemia   . Hypertension   . COPD (chronic obstructive pulmonary disease)   . Peripheral vascular disease   . Coronary artery disease   . Hx of echocardiogram 03/2011    showed a mean gradient that had increased to 41 and a peak gradient that had increase to 70, she has moderate tricuspid regurigitation, mild to moderate mitral regurigitation, and significant LA dilation.     PSH:   Past Surgical History  Procedure Laterality Date  . Coronary artery bypass graft  09/2009    x3 by Dr Alla German with LIMA to the LAD, a vein to the circumflex and a vien to the PDA.  Marland Kitchen Abdominal hysterectomy    . Thoracentesis  11-2009  . Av fistula placement  01-12-2008    left upper arm  . Cardiac catheterization  10/15/2009    which showed low normal LV function with mild inferior hypocontractility.    Allergies:  Sulfa antibiotics and Codeine Prior to Admit Meds:   Prescriptions prior to admission  Medication Sig Dispense Refill  . acetaminophen (TYLENOL) 500 MG tablet Take 1,000 mg by mouth every 6 (six) hours as needed for moderate pain.      Marland Kitchen. aspirin 81 MG chewable tablet Chew 1 tablet (81 mg total) by mouth daily.      Marland Kitchen. atorvastatin (LIPITOR) 40 MG tablet Take 1 tablet (40 mg total) by mouth daily at 6 PM.  90 tablet  3  . clopidogrel (PLAVIX) 75 MG tablet Take 1 tablet (75 mg total) by mouth daily with breakfast.  90 tablet  3  . diltiazem (DILACOR XR)  120 MG 24 hr capsule Take 1 capsule by mouth daily.      Marland Kitchen. docusate sodium (COLACE) 100 MG capsule Take 100 mg by mouth as needed for mild constipation.       . insulin NPH-insulin regular (NOVOLIN 70/30) (70-30) 100 UNIT/ML injection Inject 10 Units into the skin daily as needed (when blood sugar is low). As directed per sliding scale      . metoprolol tartrate (LOPRESSOR) 25 MG tablet Take 1 tablet (25 mg total) by mouth 2 (two) times daily.  180 tablet  3  . multivitamin (RENA-VIT) TABS tablet Take 1 tablet by mouth at bedtime.  90 tablet  3  . nitroGLYCERIN (NITROSTAT) 0.4 MG SL tablet Place 1 tablet (0.4 mg total) under the tongue every 5 (five) minutes x 3 doses as needed for chest pain.  25 tablet  2   Fam HX:    Family History  Problem Relation Age of Onset  . Heart disease Mother    Social HX:    History   Social History  . Marital Status: Widowed    Spouse Name: N/A    Number of Children: N/A  . Years of Education: N/A   Occupational History  . Not on file.   Social History Main Topics  . Smoking status: Current Every Day Smoker -- 0.50 packs/day    Types: Cigarettes    Start date: 06/19/1973  . Smokeless tobacco: Never Used  . Alcohol Use: No  . Drug Use: No  . Sexual Activity: No   Other Topics Concern  . Not on file   Social History Narrative  . No narrative on file     ROS:  All 11 ROS were addressed and are negative except what is stated in the HPI  Physical Exam: Blood pressure 121/98, pulse 49, temperature 98.9 F (37.2 C), temperature source Oral, resp. rate 19, height 5\' 4"  (1.626 m), weight 117 lb 11.6 oz (53.4 kg), SpO2 100.00%.    General: Well developed, well nourished, in no acute distress Head: Eyes PERRLA, No xanthomas.   Normal cephalic and atramatic  Lungs:   Crackles at left base Heart:   HRRR S1 S2 Pulses are 2+ & equal.            No carotid bruit. No JVD.  No abdominal bruits. No femoral bruits. Abdomen: Bowel sounds are positive,  abdomen soft and non-tender without masses Extremities:   No clubbing, cyanosis or edema.  DP +1 Neuro: Alert and oriented X 3. Psych:  Good affect, responds appropriately    Labs:   Lab Results  Component Value Date   WBC 7.5 07/23/2013   HGB 10.7* 07/23/2013   HCT 31.5* 07/23/2013   MCV 91.0 07/23/2013   PLT 209 07/23/2013   No results found for this  basename: NA, K, CL, CO2, BUN, CREATININE, CALCIUM, LABALBU, PROT, BILITOT, ALKPHOS, ALT, AST, GLUCOSE,  in the last 168 hours No results found for this basename: PTT   Lab Results  Component Value Date   INR 1.12 06/19/2013   INR 1.49 10/20/2009   INR 1.05 10/18/2009   Lab Results  Component Value Date   CKTOTAL 45 11/11/2010   CKMB 2.2 11/11/2010   TROPONINI <0.30 07/23/2013     Lab Results  Component Value Date   CHOL 152 07/05/2013   Lab Results  Component Value Date   HDL 99 07/05/2013   Lab Results  Component Value Date   LDLCALC 44 07/05/2013   Lab Results  Component Value Date   TRIG 47 07/05/2013   Lab Results  Component Value Date   CHOLHDL 1.5 07/05/2013   No results found for this basename: LDLDIRECT      Radiology:  No results found.  EKG:  Sinus bradycardia at 49bpm with RBBB with new ST changes in V4 and V5 since EKG 06/2013  Assessment & Plan by Problem:  Principal Problem:  Chest pain  Active Problems:  End stage renal disease- HD MWF in [place]  NSTEMI 07/04/13 and 10/06/2013  DM (diabetes mellitus)  HTN (hypertension)  HLD (hyperlipidemia)  COPD (chronic obstructive pulmonary disease)  Aortic stenosis, moderate at cath 07/11/13  PAF (paroxysmal atrial fibrillation)  CAD- CABG X 3 09/2009, HSRA/DES to native RCA 07/11/13  Smoker  1.  NSTEMI with recent complex PCI of the native RCA with HSRA 08/10/2013.  She is currently pain free and has been transitioned from full dose Lovenox to IV Heparin to start this afternoon.  Beta blockers on hold due to bradycardia.  Continue ASA/statin/Plavix.   Will need repeat cath on Monday  2.  History of PAF with transient WCT tonight c/w atrial tachcyardia vs. Atrial flutter with RVR in setting of chronic RBBB - currently in sinus bradycardia.  Would not use beta blocker in the setting of bradycardia.  Will probably ultimately need PPM. 3.  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  Will probably ultimately need PPM.  4.  ESRD on HD 5.  DM 6.  HTN - controlled 7.  COPD 8.  Moderate AS 9.  Moderate pleural effusion on right with right basilar atelectasis with pulmonary vascular congestion improved compared to xray 09/03/2013.  She does have crackles on exam.  Will check a BNP.   Quintella Reichert, MD  10/06/2013  5:10 AM

## 2013-10-06 NOTE — Progress Notes (Signed)
Patient admitted early this AM by Dr. Betti Cruzeddy- please see H&P for full evaluation  Chest pain/NSTEMI  telemetry.  cycle cardiac enzymes. Initial troponin positive at time of hospital, has received therapeutic Lovenox, start heparin drip this afternoon at 2 p.m. continue aspirin and Plavix. Further management as per cardiology Dr. Mayford Knifeurner. Patient had a 2-D echocardiogram on 07/07/2013 that showed EF of 45-50%, probable moderate hypokinesis of the basal-mid inferolateral myocardium, grade 3 diastolic dysfunction, moderately calcified annulus of aortic valve with severe stenosis, mitral valve moderately calcified with mild regurgitation, dilated on left atrium.   Tachycardia  Patient had brief episode of atrial tachycardia/SVT and converted on her own back to sinus bradycardia. 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  -Dr. Mayford Knifeurner saw in consult- plan for cath on monday  Diabetes  Check hemoglobin A1c. Sliding scale insulin. Diabetic diet.   Hypertension  Stable. Resume home antihypertensive medications except metoprolol and diltiazem once verified per pharmacy.   Hyperlipidemia  Continue statin.   COPD  Stable. Patient counseled on smoking cessation, reports that she quit recently, but cannot specify when she quit smoking  Severe aortic stenosis  Stable. Management as per cardiology, and evaluated by thoracic surgery, Dr. Alla GermanVanTright who felt that patient was not a candidate for surgical intervention at this time.   End stage renal disease  Dialysis on M, W, F at [place]. Patient will need nephrology consultation in the morning.  -paged nephology here  Marlin CanaryJessica Suheily Birks DO

## 2013-10-06 NOTE — H&P (Signed)
Patient's PCP: Galvin ProfferHAGUE, IMRAN P, MD Patient's cardiologist: Dr. Tresa EndoKelly  Chief Complaint: Chest pain  History of Present Illness: [name] is a 73 y.o. African American  female with history of NSTEMI, CAD with history of CABG x3 by Dr. Morton PetersVan Tright with LIMA to LAD, vein to the circumflex, and vein to PDA in 2011, HSRA/DES to native RCA 07/11/13, ESRD on HD MWF, DM, AS with pulmonary hypertension, PAF, HTN, Hyperlipidemia, COPD, and tobacco use who presents with the above complaints.  Patient is a poor historian, she was able to provide some history.  She indicated that she had dialysis yesterday morning at 6 a.m. like she normally does.  Then she had chest pain sometime thereafter, she is not sure if she had chest pain in the afternoon or evening, but thinks it was in the evening.  She continued to have chest pain until she was brought to [facility].  Patient was found to be tachycardic with a heart rate of 140s, she received 2 fluid boluses of normal saline 500 cc.  She subsequently converted to sinus bradycardia.  She also had positive troponin of 0.62, she was transferred to [facility] for further care and management.  Currently she denies any chest pain.  Denies any recent fevers, chills, shortness of breath, abdominal pain, diarrhea, headaches or vision changes.  Patient complained of cough few weeks ago but since then has resolved.  Patient reports that she quit smoking recently but could not specify when she quit smoking.  She did complain of some jaw pain with the chest pain.  Also complaining of left arm pain.  Denied any diaphoresis.  Currently denies any jaw pain or left arm pain.  Review of Systems: All systems reviewed with the patient and positive as per history of present illness, otherwise all other systems are negative.  Past Medical History  Diagnosis Date  . CHF (congestive heart failure)   . Diabetes mellitus   . Chronic kidney disease   . Hyperlipidemia   . Anemia   .  Hypertension   . COPD (chronic obstructive pulmonary disease)   . Peripheral vascular disease   . Coronary artery disease   . Hx of echocardiogram 03/2011    showed a mean gradient that had increased to 41 and a peak gradient that had increase to 70, she has moderate tricuspid regurigitation, mild to moderate mitral regurigitation, and significant LA dilation.   Past Surgical History  Procedure Laterality Date  . Coronary artery bypass graft  09/2009    x3 by Dr Alla GermanVanTright with LIMA to the LAD, a vein to the circumflex and a vien to the PDA.  Marland Kitchen. Abdominal hysterectomy    . Thoracentesis  11-2009  . Av fistula placement  01-12-2008    left upper arm  . Cardiac catheterization  10/15/2009    which showed low normal LV function with mild inferior hypocontractility.   Family History  Problem Relation Age of Onset  . Heart disease Mother    History   Social History  . Marital Status: Widowed    Spouse Name: N/A    Number of Children: N/A  . Years of Education: N/A   Occupational History  . Not on file.   Social History Main Topics  . Smoking status: Current Every Day Smoker -- 0.50 packs/day    Types: Cigarettes    Start date: 06/19/1973  . Smokeless tobacco: Never Used  . Alcohol Use: No  . Drug Use: No  .  Sexual Activity: No   Other Topics Concern  . Not on file   Social History Narrative  . No narrative on file   Allergies: Sulfa antibiotics and Codeine  Home Meds: Prior to Admission medications   Medication Sig Start Date End Date Taking? Authorizing Provider  acetaminophen (TYLENOL) 500 MG tablet Take 1,000 mg by mouth every 6 (six) hours as needed for moderate pain.    Historical Provider, MD  aspirin 81 MG chewable tablet Chew 1 tablet (81 mg total) by mouth daily. 07/13/13   Abelino Derrick, PA-C  atorvastatin (LIPITOR) 40 MG tablet Take 1 tablet (40 mg total) by mouth daily at 6 PM. 07/13/13   Abelino Derrick, PA-C  clopidogrel (PLAVIX) 75 MG tablet Take 1 tablet  (75 mg total) by mouth daily with breakfast. 07/13/13   Abelino Derrick, PA-C  diltiazem (DILACOR XR) 120 MG 24 hr capsule Take 1 capsule by mouth daily. 09/03/13   Historical Provider, MD  docusate sodium (COLACE) 100 MG capsule Take 100 mg by mouth as needed for mild constipation.     Historical Provider, MD  insulin NPH-insulin regular (NOVOLIN 70/30) (70-30) 100 UNIT/ML injection Inject 10 Units into the skin daily as needed (when blood sugar is low). As directed per sliding scale    Historical Provider, MD  metoprolol tartrate (LOPRESSOR) 25 MG tablet Take 1 tablet (25 mg total) by mouth 2 (two) times daily. 07/13/13   Abelino Derrick, PA-C  multivitamin (RENA-VIT) TABS tablet Take 1 tablet by mouth at bedtime. 07/13/13   Abelino Derrick, PA-C  nitroGLYCERIN (NITROSTAT) 0.4 MG SL tablet Place 1 tablet (0.4 mg total) under the tongue every 5 (five) minutes x 3 doses as needed for chest pain. 07/13/13   Abelino Derrick, PA-C    Physical Exam: Blood pressure 121/98, pulse 49, temperature 98.9 F (37.2 C), temperature source Oral, resp. rate 19, height 5\' 4"  (1.626 m), weight 53.4 kg (117 lb 11.6 oz), SpO2 100.00%. General: Awake, Oriented x3, No acute distress. HEENT: EOMI, Moist mucous membranes Neck: Supple CV: S1 and S2, grade 5/6 systolic murmur Lungs: Clear to ascultation bilaterally Abdomen: Soft, Nontender, Nondistended, +bowel sounds. Ext: Good pulses. Trace edema. No clubbing or cyanosis noted. Neuro: Cranial Nerves II-XII grossly intact. Has 5/5 motor strength in upper and lower extremities.  Lab results: No results found for this basename: NA, K, CL, CO2, GLUCOSE, BUN, CREATININE, CALCIUM, MG, PHOS,  in the last 72 hours No results found for this basename: AST, ALT, ALKPHOS, BILITOT, PROT, ALBUMIN,  in the last 72 hours No results found for this basename: LIPASE, AMYLASE,  in the last 72 hours No results found for this basename: WBC, NEUTROABS, HGB, HCT, MCV, PLT,  in the last 72  hours No results found for this basename: CKTOTAL, CKMB, CKMBINDEX, TROPONINI,  in the last 72 hours No components found with this basename: POCBNP,  No results found for this basename: DDIMER,  in the last 72 hours No results found for this basename: HGBA1C,  in the last 72 hours No results found for this basename: CHOL, HDL, LDLCALC, TRIG, CHOLHDL, LDLDIRECT,  in the last 72 hours No results found for this basename: TSH, T4TOTAL, FREET3, T3FREE, THYROIDAB,  in the last 72 hours No results found for this basename: VITAMINB12, FOLATE, FERRITIN, TIBC, IRON, RETICCTPCT,  in the last 72 hours Imaging results:  No results found. Other results: EKG: Sinus Bradycardia HR 49.  Assessment & Plan by Problem: Chest pain/NSTEMI  Admit the patient to telemetry.  Continue to cycle cardiac enzymes.  Initial troponin positive at time of hospital, has received therapeutic Lovenox, start heparin drip this afternoon at 2 p.m. continue aspirin and Plavix.  Further management as per cardiology Dr. Mayford Knife.  Patient had a 2-D echocardiogram on 07/07/2013 that showed EF of 45-50%, probable moderate hypokinesis of the basal-mid inferolateral myocardium, grade 3 diastolic dysfunction, moderately calcified annulus of aortic valve with severe stenosis, mitral valve moderately calcified with mild regurgitation, dilated on left atrium.  Tachycardia Patient had brief episode of atrial tachycardia/SVT and converted on her own back to sinus bradycardia.  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  Discussed with Dr. Mayford Knife to see if patient needs pacemaker, further management as per cardiology.  Diabetes Check hemoglobin A1c.  Sliding scale insulin.  Diabetic diet.  Hypertension Stable.  Resume home antihypertensive medications except metoprolol and diltiazem once verified per pharmacy.  Hyperlipidemia Continue statin.  COPD Stable.  Patient counseled on smoking cessation,  reports that she quit recently, but cannot specify when she quit smoking.  Severe aortic stenosis Stable.  Management as per cardiology, and evaluated by thoracic surgery, Dr. Alla German who felt that patient was not a candidate for surgical intervention at this time.  End stage renal disease Dialysis on M, W, F at [place].  Patient will need nephrology consultation in the morning.  Prophylaxis Therapeutic anticoagulation.  CODE STATUS Full code.  Disposition Admit the patient to step down as inpatient.  Time spent on admission, talking to the patient, and coordinating care was: 60 mins.  Camreigh Michie A, MD 10/06/2013, 4:59 AM

## 2013-10-06 NOTE — Progress Notes (Signed)
ANTICOAGULATION CONSULT NOTE - Initial Consult  Pharmacy Consult for Heparin  Indication: chest pain/ACS  Allergies  Allergen Reactions  . Sulfa Antibiotics Other (See Comments)    unknown  . Codeine Rash   Patient Measurements: Height: 5\' 4"  (162.6 cm) Weight: 117 lb 11.6 oz (53.4 kg) IBW/kg (Calculated) : 54.7  Vital Signs: Temp: 98.9 F (37.2 C) (02/07 0400) Temp src: Oral (02/07 0400) BP: 121/98 mmHg (02/07 0400) Pulse Rate: 49 (02/07 0400)  Labs: No results found for this basename: HGB, HCT, PLT, APTT, LABPROT, INR, HEPARINUNFRC, CREATININE, CKTOTAL, CKMB, TROPONINI,  in the last 72 hours  Estimated Creatinine Clearance: 19.4 ml/min (by C-G formula based on Cr of 2.27).  Medical History: Past Medical History  Diagnosis Date  . CHF (congestive heart failure)   . Diabetes mellitus   . Chronic kidney disease   . Hyperlipidemia   . Anemia   . Hypertension   . COPD (chronic obstructive pulmonary disease)   . Peripheral vascular disease   . Coronary artery disease   . Hx of echocardiogram 03/2011    showed a mean gradient that had increased to 41 and a peak gradient that had increase to 70, she has moderate tricuspid regurigitation, mild to moderate mitral regurigitation, and significant LA dilation.    Assessment: -73 [facility] F [facility]/ACS -HD patient -H/H 11.4/35.7 -Plts 180 -INR 1.2 -Lovenox 55 mg [place] x 1 at ~0200  Goal of Therapy:  Heparin level 0.3-0.7 units/ml Monitor platelets by anticoagulation protocol: Yes   Plan:  -Start heparin infusion 650 units/hr (NO BOLUS with recent Lovenox) at 1400 today -2200 HL -Daily CBC/HL -Monitor for bleeding -F/U cardiology plans  Abran DukeLedford, Drequan Ironside 10/06/2013,4:43 AM

## 2013-10-06 NOTE — Progress Notes (Signed)
Patient ID: [name], female   DOB: 1940/12/29, 73 y.o.   MRN: [id number]     HPI: [name] is a 73 y.o. female who has a history of end-stage renal disease, CAD, and aortic stenosis who suffered a non-ST segment elevation myocardial infarction in February, 2011. At that time, she underwent CABG surgery x3 by Dr. Morton PetersVan Tright and a LIMA was placed to the LAD, a vein to the circumflex, and a vein to the PDA. She did have very mild aortic stenosis at time of catheterization with a mean gradient of 10 mm. During her surgery, an intraoperative TEE confirmed mild AS and was not felt that this was severe enough to warrant replacement at that time. In August 2012 or aortic valve gradient had increased to 41 mm mean, with a peak gradient of 70. In November, she developed episodes of chest pain during dialysis as well as atrial fibrillation. She was transported to [facility] from her dialysis and [place] in one 07/06/2013, cardiac catheterization revealed diffusely calcified severe disease involving the right artery with an occluded graft that supplied the PDA. The graft that supplied the obtuse marginal vessel was also occluded and the LIMA to the LAD was patent. She did have moderate aortic valve stenosis. She was turned down for reconsideration of repeat surgery. Consequently, 07/11/2013 I performed complex  HSRA of her severely calcified native RCA. This was very difficult but successful and ultimately tandem 3.0x38 mm DES stents were inserted into the native RCA with all sites being postdilated to 3.5 mm and with a site of residual extrinsic calcium being postdilated to 3.89 in the mid RCA.  Subsequently, she has felt markedly improved without chest pain. I have not seen her since her complex percutaneous cardiac intervention. She presents to the office today for evaluation.  She states recently she went to the emergency room for palpitations and diltiazem 120 mg was prescribed.  Past Medical History    Diagnosis Date  . CHF (congestive heart failure)   . Diabetes mellitus   . Chronic kidney disease   . Hyperlipidemia   . Anemia   . Hypertension   . COPD (chronic obstructive pulmonary disease)   . Peripheral vascular disease   . Coronary artery disease   . Hx of echocardiogram 03/2011    showed a mean gradient that had increased to 41 and a peak gradient that had increase to 70, she has moderate tricuspid regurigitation, mild to moderate mitral regurigitation, and significant LA dilation.    Past Surgical History  Procedure Laterality Date  . Coronary artery bypass graft  09/2009    x3 by Dr Alla GermanVanTright with LIMA to the LAD, a vein to the circumflex and a vien to the PDA.  Marland Kitchen. Abdominal hysterectomy    . Thoracentesis  11-2009  . Av fistula placement  01-12-2008    left upper arm  . Cardiac catheterization  10/15/2009    which showed low normal LV function with mild inferior hypocontractility.    Allergies  Allergen Reactions  . Sulfa Antibiotics Other (See Comments)    unknown  . Codeine Rash    No current facility-administered medications for this visit.   No current outpatient prescriptions on file.   Facility-Administered Medications Ordered in Other Visits  Medication Dose Route Frequency Provider Last Rate Last Dose  . acetaminophen (TYLENOL) tablet 650 mg  650 mg Oral Q6H PRN Cristal FordSrikar A Reddy, MD   650 mg at 10/06/13 1019   Or  .  acetaminophen (TYLENOL) suppository 650 mg  650 mg Rectal Q6H PRN Cristal Ford, MD      . amiodarone (PACERONE) tablet 200 mg  200 mg Oral BID Laurey Morale, MD      . aspirin chewable tablet 81 mg  81 mg Oral Daily Cristal Ford, MD   81 mg at 10/06/13 1019  . atorvastatin (LIPITOR) tablet 40 mg  40 mg Oral q1800 Cristal Ford, MD      . clopidogrel (PLAVIX) tablet 75 mg  75 mg Oral Q breakfast Cristal Ford, MD   75 mg at 10/06/13 0807  . docusate sodium (COLACE) capsule 100 mg  100 mg Oral PRN Cristal Ford, MD      . heparin  ADULT infusion 100 units/mL (25000 units/250 mL)  650 Units/hr Intravenous Continuous Abran Duke, RPH      . insulin aspart (novoLOG) injection 0-5 Units  0-5 Units Subcutaneous QHS Cristal Ford, MD      . insulin aspart (novoLOG) injection 0-9 Units  0-9 Units Subcutaneous TID WC Cristal Ford, MD   2 Units at 10/06/13 [phone]  . multivitamin (RENA-VIT) tablet 1 tablet  1 tablet Oral QHS Cristal Ford, MD      . ondansetron [facility]) tablet 4 mg  4 mg Oral Q6H PRN Cristal Ford, MD       Or  . ondansetron (ZOFRAN) injection 4 mg  4 mg Intravenous Q6H PRN Cristal Ford, MD      . sodium chloride 0.9 % injection 3 mL  3 mL Intravenous Q12H Cristal Ford, MD   3 mL at 10/06/13 1020    History   Social History  . Marital Status: Widowed    Spouse Name: N/A    Number of Children: N/A  . Years of Education: N/A   Occupational History  . Not on file.   Social History Main Topics  . Smoking status: Current Every Day Smoker -- 0.50 packs/day    Types: Cigarettes    Start date: 06/19/1973  . Smokeless tobacco: Never Used  . Alcohol Use: No  . Drug Use: No  . Sexual Activity: No   Other Topics Concern  . Not on file   Social History Narrative  . No narrative on file    Family History  Problem Relation Age of Onset  . Heart disease Mother     ROS is negative for fevers, chills or night sweats.   She denies any new visual changes. She denies significant change in weight. She denies hearing issues. He does note occasional palpitations. She denies PND orthopnea. She denies recurrent anginal symptoms. She denies wheezing, cough, or congestion. She denies abdominal pain. She denies blood in stool or urine. She denies recent edema. There is no history of stroke. She is status post hysterectomy. Has an AV fistula in her left upper extremity. Other comprehensive 14 point system review is negative.  PE BP 140/60  Pulse 81  Ht 5\' 4"  (1.626 m)  Wt 123 lb 12.8 oz (56.155 kg)  BMI  21.24 kg/m2  General: Alert, oriented, no distress.  Skin: normal turgor, no rashes HEENT: Normocephalic, atraumatic. Pupils round and reactive; sclera anicteric;no lid lag. Extraocular muscles intact. Nose without nasal septal hypertrophy Mouth/Parynx benign; Mallinpatti scale 3 Neck: No JVD, transmitted murmur to the carotid; slightly delayed carotid upstroke Lungs: clear to ausculatation and percussion; no wheezing or rales Chest wall: no tenderness to palpitation Heart: RRR, s1  s2 normal 2/6 systolic murmur in aortic area no AR; no S3 gallop, no rub Abdomen: soft, nontender; no hepatosplenomehaly, BS+; abdominal aorta nontender and not dilated by palpation. Back: no CVA tenderness Pulses 2+ soft femoral bruits bilaterally Extremities: no clubbing cyanosis or edema, Homan's sign negative  Neurologic: grossly nonfocal; cranial nerves grossly normal. Psychologic: normal affect and mood.  ECG (independently read by me): Sinus rhythm at 81 beats per minute. 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  LABS:  BMET    Component Value Date/Time   NA 138 10/06/2013 0504   K 3.8 10/06/2013 0504   CL 96 10/06/2013 0504   CO2 26 10/06/2013 0504   GLUCOSE 207* 10/06/2013 0504   BUN 11 10/06/2013 0504   CREATININE 2.98* 10/06/2013 0504   CREATININE 4.32* 06/19/2013 1428   CALCIUM 8.9 10/06/2013 0504   GFRNONAA 15* 10/06/2013 0504   GFRAA 17* 10/06/2013 0504     Hepatic Function Panel     Component Value Date/Time   PROT 7.0 10/06/2013 0504   ALBUMIN 3.4* 10/06/2013 0504   AST 45* 10/06/2013 0504   ALT 21 10/06/2013 0504   ALKPHOS 154* 10/06/2013 0504   BILITOT <0.2* 10/06/2013 0504     CBC    Component Value Date/Time   WBC 5.8 10/06/2013 0504   RBC 3.43* 10/06/2013 0504   HGB 10.6* 10/06/2013 0504   HCT 33.4* 10/06/2013 0504   PLT 203 10/06/2013 0504   MCV 97.4 10/06/2013 0504   MCH 30.9 10/06/2013 0504   MCHC 31.7 10/06/2013 0504   RDW 16.7* 10/06/2013 0504    LYMPHSABS 1.3 10/06/2013 0504   MONOABS 0.9 10/06/2013 0504   EOSABS 0.3 10/06/2013 0504   BASOSABS 0.1 10/06/2013 0504     BNP No results found for this basename: probnp    Lipid Panel     Component Value Date/Time   CHOL 122 10/06/2013 0615   TRIG 36 10/06/2013 0615   HDL 89 10/06/2013 0615   CHOLHDL 1.4 10/06/2013 0615   VLDL 7 10/06/2013 0615   LDLCALC 26 10/06/2013 0615     RADIOLOGY: No results found.    ASSESSMENT AND PLAN: [name] is a 73 year old African American female who suffered a non-ST segment elevation myocardial infarction in 2011 and underwent CABG surgery at that time. Subsequently, she's been found to have graft occlusion to both the vein graft supplying the RCA and a vein graft supplying the marginal vessel and has an intact LIMA graft. She also has developed severe aortic valve stenosis and was turned down for surgery. She underwent successful high-speed rotational arthrectomy of a severely diseased and calcified RCA and had extensive stenting with a 76 mm of DES stent placed. She has felt significantly improved following her successful intervention. She recently had developed palpitations and was placed back on diltiazem. Her heart rate today is controlled and she is in sinus rhythm. She's not having any CHF symptoms presently. She gets her dialysis in [place] on Monday Wednesday and Fridays. Since she is on diltiazem which apparently was started in an emergency room evaluation, I have recommended she stop her amlodipine. She is on aspirin and Plavix for dual anti-platelet therapy. As long as she remains stable, I will see her in 6 months for cardiology reevaluation.     Lennette Bihari, MD, [facility]  10/06/2013 11:12 AM

## 2013-10-06 NOTE — Progress Notes (Addendum)
Patient ID: [name], female   DOB: 1941-01-03, 73 y.o.   MRN: [id number]   SUBJECTIVE: Doing ok this morning.  No chest pain.  Sinus Goldblatt in 50s.  Troponin up to 10.    Marland Kitchen aspirin  81 mg Oral Daily  . atorvastatin  40 mg Oral q1800  . clopidogrel  75 mg Oral Q breakfast  . insulin aspart  0-5 Units Subcutaneous QHS  . insulin aspart  0-9 Units Subcutaneous TID WC  . multivitamin  1 tablet Oral QHS  . sodium chloride  3 mL Intravenous Q12H  heparin gtt    Filed Vitals:   10/06/13 0500 10/06/13 0515 10/06/13 0600 10/06/13 0700  BP: 123/39 140/47 115/41 121/45  Pulse:      Temp:    98.7 F (37.1 C)  TempSrc:    Oral  Resp: 18 18 19 16   Height:      Weight: 53.4 kg (117 lb 11.6 oz)     SpO2: 100% 100% 100% 100%   No intake or output data in the 24 hours ending 10/06/13 0908  LABS: Basic Metabolic Panel:  Recent Labs  13/24/40 0504  NA 138  K 3.8  CL 96  CO2 26  GLUCOSE 207*  BUN 11  CREATININE 2.98*  CALCIUM 8.9  MG 2.0   Liver Function Tests:  Recent Labs  10/06/13 0504  AST 45*  ALT 21  ALKPHOS 154*  BILITOT <0.2*  PROT 7.0  ALBUMIN 3.4*   No results found for this basename: LIPASE, AMYLASE,  in the last 72 hours CBC:  Recent Labs  10/06/13 0504  WBC 5.8  NEUTROABS 3.2  HGB 10.6*  HCT 33.4*  MCV 97.4  PLT 203   Cardiac Enzymes:  Recent Labs  10/06/13 0504  TROPONINI 10.42*   BNP: No components found with this basename: POCBNP,  D-Dimer: No results found for this basename: DDIMER,  in the last 72 hours Hemoglobin A1C: No results found for this basename: HGBA1C,  in the last 72 hours Fasting Lipid Panel: No results found for this basename: CHOL, HDL, LDLCALC, TRIG, CHOLHDL, LDLDIRECT,  in the last 72 hours Thyroid Function Tests: No results found for this basename: TSH, T4TOTAL, FREET3, T3FREE, THYROIDAB,  in the last 72 hours Anemia Panel: No results found for this basename: VITAMINB12, FOLATE, FERRITIN, TIBC, IRON, RETICCTPCT,   in the last 72 hours  RADIOLOGY: No results found.  PHYSICAL EXAM General: NAD Neck: JVP 12 cm, no thyromegaly or thyroid nodule.  Lungs: Decreased breath sounds right base.  CV: Nondisplaced PMI.  Heart regular S1/S2, no S3/S4, 3/6 systolic murmur RUSB with obscured S2.  1+ edema 1/2 to knees bilaterally.   Abdomen: Soft, nontender, no hepatosplenomegaly, no distention.  Neurologic: Alert and oriented x 3.  Psych: Normal affect. Extremities: No clubbing or cyanosis.   TELEMETRY: Reviewed telemetry NSR in 32s:  ASSESSMENT AND PLAN: 73 yo with history of severe COPD, CAD s/p CABG, ESRD, at least moderate AS, and history of paroxysmal atrial fibrillation presented with chest pain after HD.  She was noted to have SVT in hospital (probably atrial flutter).   1. CAD: s/p CABG and intervention to native RCA in 12/14.  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  ECG today shows RBBB, same as past.  Troponin to 10.  No further chest pain.  NSTEMI.  It is not inconceivable that this was a demand ischemia event given noted atrial flutter  with RVR.  However, she has known severe CAD.  - Continue ASA, statin, Plavix.  - On heparin gtt - [facility] is currently pain-free. 2. Atrial flutter: Suspect patient was in atrial flutter at admission and last night.  Has history of PAF.  May be cause of demand ischemia, especially in setting of significant aortic stenosis.  HR in the 50s, NSR currently.   - I am going to start her on amiodarone 200 mg bid to try to keep her in NSR.  However, will need to follow closely as HR may not tolerate. She has COPD but I really think that she is going to tolerate atrial arrhythmias very poorly with her AS, will need to keep ultimate amiodarone dose as low as possible.  - Probably not anticoagulation candidate with frequent falls.  3. ESRD: Will notify renal she is here, will need to be on HD schedule. 4. CHF: EF 45-50%  on prior echo. She does appear to have some volume overload on exam, will need to be controlled by HD.  5. Aortic stenosis: Severe on exam.  Will repeat echo to reassess and also to look at EF.  Not candidate for AVR with COPD and porcelain aorta.  Poor TAVR candidate with ESRD.  6. Decreased breath sounds/crackles right base, needs CXR.   [name] 10/06/2013 9:16 AM

## 2013-10-06 NOTE — Consult Note (Signed)
Pt seen, examined, and agree with above.  ESRD MWF [facility] with severe, complex CAD admitted with NSTEMI and tachy/S[place]ee arrhytmias.  WIll cont MWF HD in house.  Note plan for [facility] on Monday.  Pt currently resting and w/o pain or SOB.

## 2013-10-06 NOTE — Progress Notes (Signed)
  Echocardiogram 2D Echocardiogram has been performed.  [name] 10/06/2013, 11:51 AM

## 2013-10-06 NOTE — Consult Note (Signed)
[name] Renal Consultation Note  Indication for Consultation:  Management of ESRD/hemodialysis; anemia, hypertension/volume and secondary hyperparathyroidism  HPI: [name] is a 73 y.o. female having  had Dialysis Firday 10/05/13, morning with out incident and had chest pain sometime later at home,  she was brought to [name] and found to be tachycardic with a heart rate of 140s, given 2 fluid boluses of normal saline 500 cc. And converted to sinus bradycardia. She has a history of asymptomatic bradycardia at the kidney Baker note to have a heart rate in the 50's the day of admit. She also had positive troponin of 0.62, and was transferred to [name]  for further treatment .  Currently she denies any chest pain eating her lunch/ just back from 2 d echo . Noted Her Troponin today  Up to 10 and large right lung effusion on cxr..Denies any recent fevers, chills, shortness of breath, abdominal pain, diarrhea, headaches or vision changes.    Past Medical History  Diagnosis Date  . CHF (congestive heart failure)   . Diabetes mellitus   . Chronic kidney disease   . Hyperlipidemia   . Anemia   . Hypertension   . COPD (chronic obstructive pulmonary disease)   . Peripheral vascular disease   . Coronary artery disease   . Hx of echocardiogram 03/2011    showed a mean gradient that had increased to 41 and a peak gradient that had increase to 70, she has moderate tricuspid regurigitation, mild to moderate mitral regurigitation, and significant LA dilation.    Past Surgical History  Procedure Laterality Date  . Coronary artery bypass graft  09/2009    x3 by Dr Lawson Fiscal with LIMA to the LAD, a vein to the circumflex and a vien to the PDA.  Marland Kitchen Abdominal hysterectomy    . Thoracentesis  11-2009  . Av fistula placement  01-12-2008    left upper arm  . Cardiac catheterization  10/15/2009    which showed low normal LV function with mild inferior hypocontractility.       Family History  Problem Relation Age of Onset  . Heart disease Mother    Social= she   reports that she has been smoking Cigarettes.  She started smoking about 40 years ago. She has been smoking about 0.50 packs per day. She has never used smokeless tobacco. She reports that she does not drink alcohol or use illicit drugs.   Allergies  Allergen Reactions  . Sulfa Antibiotics Other (See Comments)    unknown  . Codeine Rash    Prior to Admission medications   Medication Sig Start Date End Date Taking? Authorizing Provider  acetaminophen (TYLENOL) 500 MG tablet Take 1,000 mg by mouth every 6 (six) hours as needed for moderate pain.    Historical Provider, MD  aspirin 81 MG chewable tablet Chew 1 tablet (81 mg total) by mouth daily. 07/13/13   Erlene Quan, PA-C  atorvastatin (LIPITOR) 40 MG tablet Take 1 tablet (40 mg total) by mouth daily at 6 PM. 07/13/13   Erlene Quan, PA-C  clopidogrel (PLAVIX) 75 MG tablet Take 1 tablet (75 mg total) by mouth daily with breakfast. 07/13/13   Erlene Quan, PA-C  diltiazem (DILACOR XR) 120 MG 24 hr capsule Take 1 capsule by mouth daily. 09/03/13   Historical Provider, MD  docusate sodium (COLACE) 100 MG capsule Take 100 mg by mouth as needed for mild constipation.     Historical Provider, MD  insulin NPH-insulin regular (NOVOLIN 70/30) (70-30) 100 UNIT/ML injection Inject 10 Units into the skin daily as needed (when blood sugar is low). As directed per sliding scale    Historical Provider, MD  metoprolol tartrate (LOPRESSOR) 25 MG tablet Take 1 tablet (25 mg total) by mouth 2 (two) times daily. 07/13/13   Erlene Quan, PA-C  multivitamin (RENA-VIT) TABS tablet Take 1 tablet by mouth at bedtime. 07/13/13   Erlene Quan, PA-C  nitroGLYCERIN (NITROSTAT) 0.4 MG SL tablet Place 1 tablet (0.4 mg total) under the tongue every 5 (five) minutes x 3 doses as needed for chest pain. 07/13/13   Erlene Quan, PA-C     Results for orders placed during the  hospital encounter of 10/06/13 (from the past 48 hour(s))  MRSA PCR SCREENING     Status: None   Collection Time    10/06/13  4:14 AM      Result Value Range   MRSA by PCR NEGATIVE  NEGATIVE   Comment:            The GeneXpert MRSA Assay (FDA     approved for NASAL specimens     only), is one component of a     comprehensive MRSA colonization     surveillance program. It is not     intended to diagnose MRSA     infection nor to guide or     monitor treatment for     MRSA infections.  CBC WITH DIFFERENTIAL     Status: Abnormal   Collection Time    10/06/13  5:04 AM      Result Value Range   WBC 5.8  4.0 - 10.5 K/uL   RBC 3.43 (*) 3.87 - 5.11 MIL/uL   Hemoglobin 10.6 (*) 12.0 - 15.0 g/dL   HCT 33.4 (*) 36.0 - 46.0 %   MCV 97.4  78.0 - 100.0 fL   MCH 30.9  26.0 - 34.0 pg   MCHC 31.7  30.0 - 36.0 g/dL   RDW 16.7 (*) 11.5 - 15.5 %   Platelets 203  150 - 400 K/uL   Neutrophils Relative % 56  43 - 77 %   Neutro Abs 3.2  1.7 - 7.7 K/uL   Lymphocytes Relative 23  12 - 46 %   Lymphs Abs 1.3  0.7 - 4.0 K/uL   Monocytes Relative 16 (*) 3 - 12 %   Monocytes Absolute 0.9  0.1 - 1.0 K/uL   Eosinophils Relative 5  0 - 5 %   Eosinophils Absolute 0.3  0.0 - 0.7 K/uL   Basophils Relative 1  0 - 1 %   Basophils Absolute 0.1  0.0 - 0.1 K/uL  COMPREHENSIVE METABOLIC PANEL     Status: Abnormal   Collection Time    10/06/13  5:04 AM      Result Value Range   Sodium 138  137 - 147 mEq/L   Potassium 3.8  3.7 - 5.3 mEq/L   Chloride 96  96 - 112 mEq/L   CO2 26  19 - 32 mEq/L   Glucose, Bld 207 (*) 70 - 99 mg/dL   BUN 11  6 - 23 mg/dL   Creatinine, Ser 2.98 (*) 0.50 - 1.10 mg/dL   Calcium 8.9  8.4 - 10.5 mg/dL   Total Protein 7.0  6.0 - 8.3 g/dL   Albumin 3.4 (*) 3.5 - 5.2 g/dL   AST 45 (*) 0 - 37 U/L   ALT 21  0 - 35 U/L   Alkaline Phosphatase 154 (*) 39 - 117 U/L   Total Bilirubin <0.2 (*) 0.3 - 1.2 mg/dL   GFR calc non Af Amer 15 (*) >90 mL/min   GFR calc Af Amer 17 (*) >90 mL/min    Comment: (NOTE)     The eGFR has been calculated using the CKD EPI equation.     This calculation has not been validated in all clinical situations.     eGFR's persistently <90 mL/min signify possible Chronic Kidney     Disease.  PROTIME-INR     Status: Abnormal   Collection Time    10/06/13  5:04 AM      Result Value Range   Prothrombin Time 15.4 (*) 11.6 - 15.2 seconds   INR 1.25  0.00 - 1.49  TROPONIN I     Status: Abnormal   Collection Time    10/06/13  5:04 AM      Result Value Range   Troponin I 10.42 (*) <0.30 ng/mL   Comment:            Due to the release kinetics of cTnI,     a negative result within the first hours     of the onset of symptoms does not rule out     myocardial infarction with certainty.     If myocardial infarction is still suspected,     repeat the test at appropriate intervals.     CRITICAL RESULT CALLED TO, READ BACK BY AND VERIFIED WITH:     GREEN A,RN 10/06/13 0639 WAYK  MAGNESIUM     Status: None   Collection Time    10/06/13  5:04 AM      Result Value Range   Magnesium 2.0  1.5 - 2.5 mg/dL  LIPID PANEL     Status: None   Collection Time    10/06/13  6:15 AM      Result Value Range   Cholesterol 122  0 - 200 mg/dL   Triglycerides 36  <150 mg/dL   HDL 89  >39 mg/dL   Total CHOL/HDL Ratio 1.4     VLDL 7  0 - 40 mg/dL   LDL Cholesterol 26  0 - 99 mg/dL   Comment:            Total Cholesterol/HDL:CHD Risk     Coronary Heart Disease Risk Table                         Men   Women      1/2 Average Risk   3.4   3.3      Average Risk       5.0   4.4      2 X Average Risk   9.6   7.1      3 X Average Risk  23.4   11.0                Use the calculated Patient Ratio     above and the CHD Risk Table     to determine the patient's CHD Risk.                ATP III CLASSIFICATION (LDL):      <100     mg/dL   Optimal      100-129  mg/dL   Near or Above  Optimal      130-159  mg/dL   Borderline      160-189  mg/dL   High       >190     mg/dL   Very High    ROS:  She hpi for positives   Physical Exam: Filed Vitals:   10/06/13 0700  BP: 121/45  Pulse:   Temp: 98.7 F (37.1 C)  Resp: 16     General: Alert elderly BF NAD  eating Lunch HEENT: [place], nonicteric,  Neck:  No jvd, supple Heart: Kimmel in 46s regular/ loud #/6 sem RUSB   No rub noted Lungs: Decreased R>L base Abdomen:  Soft, nd, nt Extremities:  No pedal edema Skin: R lower leg with blister ulcer under dressing Neuro: Alert. Ox3, appropriate, mo acute deficits  For her Dialysis Access: pos. Bruit L UA AVF  Dialysis Orders: Baker: [facility]  on MWF . EDW 52 HD [place] 3.0 k, 2.25 ca  Time 3hrs 74mn Heparin  . Access L UA AVFBFR 400 DFR 800    Hectorol2 mcg IV/HD Epogen 1000   Units IV/HD  Venofer  1018mweekly hd    Assessment/Plan 1. NSTEMI- with ho CAD ho CABG and Porcelain AS . Cardiology manging. Note plans for [facility] monday 2. ESRD -  HD MWF ( [facility]) K3.8 and vol stable with Large R pl effusion and symp. For now 3. Atrial Fib= not a good anticoag cand./ Card. Started low dose amiodarone 4. Hypertension/volume  - stable bp and wt 53.4 ( edw 52)/ pl effusion  uf on hd as bp tol. 5. Anemia  - hgb 10.6 ESA AND iron on hd 6. Metabolic bone disease -  Vit d and  Binder / fu ca .phos 7. AORTIC STENOSIS- 2 d echo just done/ card rx 8. Copd - needs to stop smoking/ "stopped sometime in Dec" 9. Nutrition - renal Carb mod/ renal vit  DaErnest HaberPA-C [id number]/02/2014, 11:35 AM

## 2013-10-07 DIAGNOSIS — I10 Essential (primary) hypertension: Secondary | ICD-10-CM

## 2013-10-07 DIAGNOSIS — I214 Non-ST elevation (NSTEMI) myocardial infarction: Secondary | ICD-10-CM

## 2013-10-07 DIAGNOSIS — N186 End stage renal disease: Secondary | ICD-10-CM

## 2013-10-07 LAB — CBC
HEMATOCRIT: 32.7 % — AB (ref 36.0–46.0)
HEMOGLOBIN: 10.3 g/dL — AB (ref 12.0–15.0)
MCH: 30.9 pg (ref 26.0–34.0)
MCHC: 31.5 g/dL (ref 30.0–36.0)
MCV: 98.2 fL (ref 78.0–100.0)
Platelets: 207 10*3/uL (ref 150–400)
RBC: 3.33 MIL/uL — ABNORMAL LOW (ref 3.87–5.11)
RDW: 16.7 % — ABNORMAL HIGH (ref 11.5–15.5)
WBC: 7.5 10*3/uL (ref 4.0–10.5)

## 2013-10-07 LAB — HEMOGLOBIN AND HEMATOCRIT, BLOOD
HCT: 33.5 % — ABNORMAL LOW (ref 36.0–46.0)
HEMATOCRIT: 30.6 % — AB (ref 36.0–46.0)
HEMOGLOBIN: 10.7 g/dL — AB (ref 12.0–15.0)
Hemoglobin: 9.8 g/dL — ABNORMAL LOW (ref 12.0–15.0)

## 2013-10-07 LAB — HEPARIN LEVEL (UNFRACTIONATED)
Heparin Unfractionated: 0.34 IU/mL (ref 0.30–0.70)
Heparin Unfractionated: 0.25 IU/mL — ABNORMAL LOW (ref 0.30–0.70)
Heparin Unfractionated: 0.29 IU/mL — ABNORMAL LOW (ref 0.30–0.70)

## 2013-10-07 LAB — GLUCOSE, CAPILLARY
GLUCOSE-CAPILLARY: 187 mg/dL — AB (ref 70–99)
GLUCOSE-CAPILLARY: 141 mg/dL — AB (ref 70–99)
GLUCOSE-CAPILLARY: 207 mg/dL — AB (ref 70–99)
Glucose-Capillary: 118 mg/dL — ABNORMAL HIGH (ref 70–99)

## 2013-10-07 MED ORDER — SODIUM CHLORIDE 0.9 % IJ SOLN
3.0000 mL | Freq: Two times a day (BID) | INTRAMUSCULAR | Status: DC
  Administered 2013-10-08: 3 mL via INTRAVENOUS

## 2013-10-07 MED ORDER — ASPIRIN 81 MG PO CHEW
81.0000 mg | CHEWABLE_TABLET | ORAL | Status: AC
  Administered 2013-10-08: 81 mg via ORAL
  Filled 2013-10-07: qty 1

## 2013-10-07 MED ORDER — SODIUM CHLORIDE 0.9 % IV SOLN: 250.0000 mL | INTRAVENOUS | Status: DC | PRN

## 2013-10-07 MED ORDER — SODIUM CHLORIDE 0.9 % IJ SOLN: 3.0000 mL | INTRAMUSCULAR | Status: DC | PRN

## 2013-10-07 NOTE — Progress Notes (Signed)
I saw the patient and agree with the above assessment and plan.    No new events.  [facility]

## 2013-10-07 NOTE — Progress Notes (Signed)
[facility] PROGRESS NOTE  [name] A [name] ZOX:[id number] DOB: [birth year] DOA: 10/06/2013 PCP: Galvin ProfferHAGUE, IMRAN P, MD  Assessment/Plan: Chest pain/NSTEMI  -heparin gtt -cardiology consult  -2-D echocardiogram on 07/07/2013 that showed EF of 45-50%, probable moderate hypokinesis of the basal-mid inferolateral myocardium, grade 3 diastolic dysfunction, moderately calcified annulus of aortic valve with severe stenosis, mitral valve moderately calcified with mild regurgitation, dilated on left atrium.   Tachycardia/Near Patient had brief episode of atrial tachycardia/SVT and converted on her own back to sinus bradycardia -holding meds  Presume hemorrhoidal bleed -H/H stable -continue to monitor  Diabetes  Check hemoglobin A1c. Sliding scale insulin. Diabetic diet.   Hypertension  Stable  Hyperlipidemia  Continue statin.   COPD  Stable. Patient counseled on smoking cessation, reports that she quit recently, but cannot specify when she quit smoking.   Severe aortic stenosis  Stable. Management as per cardiology, and evaluated by thoracic surgery, Dr. Alla GermanVanTright who felt that patient was not a candidate for surgical intervention at this time.   End stage renal disease  [place], W, F [place].  -Nephro consult   Code Status: full Family Communication: patient Disposition Plan:    Consultants:  Renal- HD MWF  cards  Procedures:  HD- 2/9  Cath-2/9  Antibiotics:    HPI/Subjective: Had episode of BRB last night- no further bleeding  Objective: Filed Vitals:   10/07/13 0400  BP: 150/46  Pulse:   Temp:   Resp: 20    Intake/Output Summary (Last 24 hours) at 10/07/13 0741 Last data filed at 10/07/13 0600  Gross per 24 hour  Intake 1412.75 ml  Output      2 ml  Net 1410.75 ml   Filed Weights   10/06/13 0400 10/06/13 0500  Weight: 53.4 kg (117 lb 11.6 oz) 53.4 kg (117 lb 11.6 oz)    Exam:   General:  A+Ox3, NAD  Cardiovascular:  rrr  Respiratory: clear anterior  Abdomen: +BS, soft  Musculoskeletal: moves all 4 ext, no swlling  Data Reviewed: Basic Metabolic Panel:  Recent Labs Lab 10/06/13 0504  NA 138  K 3.8  CL 96  CO2 26  GLUCOSE 207*  BUN 11  CREATININE 2.98*  CALCIUM 8.9  MG 2.0   Liver Function Tests:  Recent Labs Lab 10/06/13 0504  AST 45*  ALT 21  ALKPHOS 154*  BILITOT <0.2*  PROT 7.0  ALBUMIN 3.4*   No results found for this basename: LIPASE, AMYLASE,  in the last 168 hours No results found for this basename: AMMONIA,  in the last 168 hours CBC:  Recent Labs Lab 10/06/13 0504 10/07/13 0330  WBC 5.8 7.5  NEUTROABS 3.2  --   HGB 10.6* 10.3*  HCT 33.4* 32.7*  MCV 97.4 98.2  PLT 203 207   Cardiac Enzymes:  Recent Labs Lab 10/06/13 0504 10/06/13 1435 10/06/13 2116  TROPONINI 10.42* 7.10* 3.93*   BNP (last 3 results) No results found for this basename: PROBNP,  in the last 8760 hours CBG:  Recent Labs Lab 10/06/13 1314 10/06/13 1744 10/06/13 2205  GLUCAP 123* 131* 139*    Recent Results (from the past 240 hour(s))  MRSA PCR SCREENING     Status: None   Collection Time    10/06/13  4:14 AM      Result Value Range Status   MRSA by PCR NEGATIVE  NEGATIVE Final   Comment:            The GeneXpert MRSA Assay (FDA  approved for NASAL specimens     only), is one component of a     comprehensive MRSA colonization     surveillance program. It is not     intended to diagnose MRSA     infection nor to guide or     monitor treatment for     MRSA infections.     Studies: Dg Chest 2 View  10/06/2013   CLINICAL DATA:  History of CHF, diabetes, hypertension, COPD  EXAM: CHEST  2 VIEW  COMPARISON:  DG CHEST PORTABLE dated 10/05/2013  FINDINGS: The cardiac silhouette is enlarged. A stable moderate sized right pleural effusions appreciated. There is slight increase conspicuity of lung parenchymal density in the aerated right lung base. No further focal regions of  consolidation or focal infiltrates appreciated. Patient is status post median sternotomy and coronary artery bypass grafting. The osseous structures unremarkable. There is also slight decrease in conspicuity of interstitial findings.  IMPRESSION: Stable right pleural effusion. Slight increase conspicuity of the atelectasis versus infiltrate right lung base. Findings which also may reflect mild decreased pulmonary edema. Surveillance evaluation recommended.   Electronically Signed   By: Salome Holmes M.D.   On: 10/06/2013 13:07    Scheduled Meds: . amiodarone  200 mg Oral BID  . aspirin  81 mg Oral Daily  . atorvastatin  40 mg Oral q1800  . calcium acetate  1,334 mg Oral TID WC  . clopidogrel  75 mg Oral Q breakfast  . insulin aspart  0-5 Units Subcutaneous QHS  . insulin aspart  0-9 Units Subcutaneous TID WC  . multivitamin  1 tablet Oral QHS  . sodium chloride  3 mL Intravenous Q12H   Continuous Infusions: . sodium chloride 10 mL/hr (10/06/13 1329)  . heparin 650 Units/hr (10/06/13 1900)    Principal Problem:   Chest pain Active Problems:   End stage renal disease- HD MWF in [place]   NSTEMI 07/04/13 and 10/06/2013   DM (diabetes mellitus)   HTN (hypertension)   HLD (hyperlipidemia)   COPD (chronic obstructive pulmonary disease)   Aortic stenosis, moderate at cath 07/11/13   PAF (paroxysmal atrial fibrillation)   CAD- CABG X 3 09/2009, HSRA/DES to native RCA 07/11/13   Smoker    Time spent: 35 min    VANN, JESSICA  [facility] Pager [phone]. If 7PM-7AM, please contact night-coverage at [email or web address], password [facility] 10/07/2013, 7:41 AM  LOS: 1 day

## 2013-10-07 NOTE — Progress Notes (Signed)
ANTICOAGULATION CONSULT NOTE  Pharmacy Consult for Heparin  Indication: chest pain/ACS  Allergies  Allergen Reactions  . Codeine Rash  . Sulfa Antibiotics Other (See Comments)    unknown   Patient Measurements: Height: 5\' 4"  (162.6 cm) Weight: 117 lb 11.6 oz (53.4 kg) IBW/kg (Calculated) : 54.7  Vital Signs: Temp: 99 F (37.2 C) (02/08 0342) Temp src: Oral (02/08 0342) BP: 121/46 mmHg (02/08 0324) Pulse Rate: 64 (02/08 0324)  Labs:  Recent Labs  10/06/13 0504 10/06/13 1435 10/06/13 2116 10/06/13 2215 10/07/13 0330  HGB 10.6*  --   --   --  10.3*  HCT 33.4*  --   --   --  32.7*  PLT 203  --   --   --  207  LABPROT 15.4*  --   --   --   --   INR 1.25  --   --   --   --   HEPARINUNFRC  --   --   --  >2.20* 0.34  CREATININE 2.98*  --   --   --   --   TROPONINI 10.42* 7.10* 3.93*  --   --     Estimated Creatinine Clearance: 14.8 ml/min (by C-G formula based on Cr of 2.98).  Medical History: Past Medical History  Diagnosis Date  . CHF (congestive heart failure)   . Diabetes mellitus   . Chronic kidney disease   . Hyperlipidemia   . Anemia   . Hypertension   . COPD (chronic obstructive pulmonary disease)   . Peripheral vascular disease   . Coronary artery disease   . Hx of echocardiogram 03/2011    showed a mean gradient that had increased to 41 and a peak gradient that had increase to 70, she has moderate tricuspid regurigitation, mild to moderate mitral regurigitation, and significant LA dilation.    Assessment: -73 [facility] F [facility] -HD patient -First HL of >2.2 was drawn right next to infusion, repeat HL this AM is 0.34 -Of note, per NP Lenny Pastelom Callahan, the pt had a small amount blood in her stool and requests that we aim for low end of heparin goal   Goal of Therapy:  Heparin level 0.3-0.7 units/ml-aim for low end per Lenny Pastelom Callahan, NP Monitor platelets by anticoagulation protocol: Yes   Plan:  -Continue heparin infusion at 650  units/hr -1200 HL to confirm -Daily CBC/HL -Monitor for bleeding -F/U cardiology plans  Abran DukeLedford, Peggye Poon 10/07/2013,4:27 AM

## 2013-10-07 NOTE — Progress Notes (Addendum)
Patient ID: [name], female   DOB: 1941/08/15, 73 y.o.   MRN: [id number]    SUBJECTIVE: Doing ok this morning.  No chest pain.  No further arrythmias on telemetry.  Troponin peaked at 10.    . amiodarone  200 mg Oral BID  . aspirin  81 mg Oral Daily  . atorvastatin  40 mg Oral q1800  . calcium acetate  1,334 mg Oral TID WC  . clopidogrel  75 mg Oral Q breakfast  . insulin aspart  0-5 Units Subcutaneous QHS  . insulin aspart  0-9 Units Subcutaneous TID WC  . multivitamin  1 tablet Oral QHS  . sodium chloride  3 mL Intravenous Q12H  heparin gtt    Filed Vitals:   10/07/13 0324 10/07/13 0342 10/07/13 0400 10/07/13 0840  BP: 121/46  150/46 143/42  Pulse: 64   59  Temp:  99 F (37.2 C)  98.6 F (37 C)  TempSrc:  Oral  Oral  Resp: 20  20 20   Height:      Weight:      SpO2: 97%  100% 100%    Intake/Output Summary (Last 24 hours) at 10/07/13 0855 Last data filed at 10/07/13 0600  Gross per 24 hour  Intake 1412.75 ml  Output      2 ml  Net 1410.75 ml    LABS: Basic Metabolic Panel:  Recent Labs  08/65/7801/03/13 0504  NA 138  K 3.8  CL 96  CO2 26  GLUCOSE 207*  BUN 11  CREATININE 2.98*  CALCIUM 8.9  MG 2.0   Liver Function Tests:  Recent Labs  10/06/13 0504  AST 45*  ALT 21  ALKPHOS 154*  BILITOT <0.2*  PROT 7.0  ALBUMIN 3.4*   No results found for this basename: LIPASE, AMYLASE,  in the last 72 hours CBC:  Recent Labs  10/06/13 0504 10/07/13 0330  WBC 5.8 7.5  NEUTROABS 3.2  --   HGB 10.6* 10.3*  HCT 33.4* 32.7*  MCV 97.4 98.2  PLT 203 207   Cardiac Enzymes:  Recent Labs  10/06/13 0504 10/06/13 1435 10/06/13 2116  TROPONINI 10.42* 7.10* 3.93*   BNP: No components found with this basename: POCBNP,  D-Dimer: No results found for this basename: DDIMER,  in the last 72 hours Hemoglobin A1C:  Recent Labs  10/06/13 1400  HGBA1C 8.5*   Fasting Lipid Panel:  Recent Labs  10/06/13 0615  CHOL 122  HDL 89  LDLCALC 26  TRIG 36    CHOLHDL 1.4   Thyroid Function Tests: No results found for this basename: TSH, T4TOTAL, FREET3, T3FREE, THYROIDAB,  in the last 72 hours Anemia Panel: No results found for this basename: VITAMINB12, FOLATE, FERRITIN, TIBC, IRON, RETICCTPCT,  in the last 72 hours  RADIOLOGY: No results found.  PHYSICAL EXAM General: NAD Neck: JVP 12 cm, no thyromegaly or thyroid nodule.  Lungs: Decreased breath sounds right base.  CV: Nondisplaced PMI.  Heart regular S1/S2, no S3/S4, 3/6 systolic murmur RUSB with obscured S2.  1+ edema 1/2 to knees bilaterally.   Abdomen: Soft, nontender, no hepatosplenomegaly, no distention.  Neurologic: Alert and oriented x 3.  Psych: Normal affect. Extremities: No clubbing or cyanosis.   TELEMETRY: Reviewed telemetry NSR in 60s:  ASSESSMENT AND PLAN: 73 yo with history of severe COPD, CAD s/p CABG, ESRD, at least moderate AS, and history of paroxysmal atrial fibrillation presented with chest pain after HD.  She was noted to have SVT in hospital (probably  atrial flutter).   1. CAD: s/p CABG and intervention to native RCA in 12/14.  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  ECG shows RBBB, same as past.  Troponin to 10.  No further chest pain.  NSTEMI.  It is not inconceivable that this was a demand ischemia event given noted atrial flutter with RVR.  However, she has known severe CAD.  - Continue ASA, statin, Plavix.  - On heparin gtt - Plan for [facility] on Monday as she is currently pain-free. 2. Atrial flutter: Suspect patient was in atrial flutter at admission and Friday night.  Has history of PAF.  May be cause of demand ischemia, especially in setting of severe aortic stenosis.  HR in the 60s, NSR currently.  I started her on amiodarone 200 mg bid to try to keep her in NSR.  She has COPD but I really think that she is going to tolerate atrial arrhythmias very poorly with her AS, will need to keep ultimate amiodarone dose as low as  possible.  - Probably not anticoagulation candidate with frequent falls.  3. ESRD: Will need HD arranged on Monday in addition to cardiac cath.  4. Chronic diastolic CHF: EF 21-30% with severe AS.  She does appear to have some volume overload on exam but is comfortable, will need to be controlled by HD.  5. Aortic stenosis: Severe by echo yesterday.  Not candidate for AVR with COPD and porcelain aorta.  Poor TAVR candidate with ESRD.  6. Decreased breath sounds/crackles right base corresponds to right pleural effusion.   7. GI: Patient had some red blood around stool yesterday, may be hemorrhoids.  Hemoglobin stable.  Follow.   Marca Ancona 10/07/2013 8:55 AM

## 2013-10-07 NOTE — Progress Notes (Signed)
Nursing: Patient was assisted to bedside commode. Noted to have liquid bright red blood present in the commode along with medium amount of brown color stool. Pharmacist Tammy SoursGreg notified and paged placed to [facility] on call. Dr. Claiborne Billingsallahan notified of current findings , assessment , and vital signs. Orders to keep heparin at current rate noted. Patient reports no additional pain of discomfort at present time. Dr. Claiborne Billingsallahan at bedside presently.

## 2013-10-07 NOTE — Progress Notes (Signed)
Subjective:  No cos Objective Vital signs in last 24 hours: Filed Vitals:   10/07/13 0324 10/07/13 0342 10/07/13 0400 10/07/13 0840  BP: 121/46  150/46 143/42  Pulse: 64   59  Temp:  99 F (37.2 C)  98.6 F (37 C)  TempSrc:  Oral  Oral  Resp: 20  20 20   Height:      Weight:      SpO2: 97%  100% 100%   Weight change:   Labs: Basic Metabolic Panel:  Recent Labs Lab 10/06/13 0504  NA 138  K 3.8  CL 96  CO2 26  GLUCOSE 207*  BUN 11  CREATININE 2.98*  CALCIUM 8.9   Liver Function Tests:  Recent Labs Lab 10/06/13 0504  AST 45*  ALT 21  ALKPHOS 154*  BILITOT <0.2*  PROT 7.0  ALBUMIN 3.4*    Recent Labs Lab 10/06/13 0504 10/07/13 0330  WBC 5.8 7.5  NEUTROABS 3.2  --   HGB 10.6* 10.3*  HCT 33.4* 32.7*  MCV 97.4 98.2  PLT 203 207   Cardiac Enzymes:  Recent Labs Lab 10/06/13 0504 10/06/13 1435 10/06/13 2116  TROPONINI 10.42* 7.10* 3.93*   CBG:  Recent Labs Lab 10/06/13 1314 10/06/13 1744 10/06/13 2205 10/07/13 0843  GLUCAP 123* 131* 139* 187*    Studies/Results: Dg Chest 2 View  10/06/2013   CLINICAL DATA:  History of CHF, diabetes, hypertension, COPD  EXAM: CHEST  2 VIEW  COMPARISON:  DG CHEST PORTABLE dated 10/05/2013  FINDINGS: The cardiac silhouette is enlarged. A stable moderate sized right pleural effusions appreciated. There is slight increase conspicuity of lung parenchymal density in the aerated right lung base. No further focal regions of consolidation or focal infiltrates appreciated. Patient is status post median sternotomy and coronary artery bypass grafting. The osseous structures unremarkable. There is also slight decrease in conspicuity of interstitial findings.  IMPRESSION: Stable right pleural effusion. Slight increase conspicuity of the atelectasis versus infiltrate right lung base. Findings which also may reflect mild decreased pulmonary edema. Surveillance evaluation recommended.   Electronically Signed   By: Salome Holmes M.D.    On: 10/06/2013 13:07   Medications: . sodium chloride 10 mL/hr (10/06/13 1329)  . heparin 650 Units/hr (10/06/13 1900)   . amiodarone  200 mg Oral BID  . aspirin  81 mg Oral Daily  . atorvastatin  40 mg Oral q1800  . calcium acetate  1,334 mg Oral TID WC  . clopidogrel  75 mg Oral Q breakfast  . insulin aspart  0-5 Units Subcutaneous QHS  . insulin aspart  0-9 Units Subcutaneous TID WC  . multivitamin  1 tablet Oral QHS  . sodium chloride  3 mL Intravenous Q12H    Physical Exam: General: Alert elderly BF NAD Heart: RRR/ loud 3/6 sem RUSB No rub noted  Lungs: Decreased R>L base  Abdomen: Soft, nd, nt  Extremities: No pedal edema  Skin: R lower leg with blister ulcer under dressing   Dialysis Access: pos. Bruit L UA AVF   Dialysis Orders: Center: [facility] on MWF .  EDW 52 HD [facility] 3.0 k, 2.25 ca Time 3hrs Heparin . Access L UA AVFBFR 400 DFR 800  Hectorol2 mcg IV/HD Epogen 1000 Units IV/HD Venofer 100mg  weekly hd   Assessment/Plan  1. NSTEMI- with ho CAD ho CABG and Porcelain AS . Cardiology manging. Note plans for LHC monday 2. ESRD - HD MWF ( [facility]) K3.8 and vol stable with Large R pl effusion and symp.  For now 3. Atrial Fib/ now in SR= not a good anticoagulin candidate with   Fall risk / gi bleed? Hemorrhoids bleeding  Yesterday hgb stable "refused rectal exam" / HO PAF/Card. Started low dose amiodarone 4. Hypertension/volume - stable bp and wt 53.4 ( edw 52)/ pl effusion uf on hd as bp tol./ uf to edw  With hd keep sbp>100 5. Anemia - hgb 10.6>10.3 noted stool with red blood yest ? Hemorrhoids/ /no hep with hd/ ESA AND iron on hd 6. Metabolic bone disease - Vit d and Binder / fu ca .phos pre hd 7. AORTIC STENOSIS- 2 d echo just done/ card rx 8. Copd - needs to stop smoking/ "stopped sometime in Dec" 9. Nutrition - renal Carb mod/ renal vit  Lenny Pastelavid Erbie Arment, PA-C [facility] Beeper [phone] 10/07/2013,12:16 PM  LOS: 1 day

## 2013-10-07 NOTE — Progress Notes (Signed)
[facility] progress note. Chief complaint. Bowel movement with small amount of blood. History of present illness. This 73 year old female with multiple medical problems including coronary artery disease, CHF, end-stage renal disease presented with chest pain and elevated troponin. Patient is currently on aspirin, Plavix, and heparin drip with cardiology plan probable left heart catheterization on next Monday. The patient had a bowel movement nursing noted small amount of associated bright red blood. I came up to see the patient and found her alert and in no distress. She states she had some rectal bleeding more distantly and ultimately had a colonoscopy with 3 polyps removed. Patient denies any abdominal pain currently. Physical exam. Vital signs. Temperature 90.9, pulse 64, respiration 20, blood pressure 121/46. O2 sats 97%. General appearance. Frail elderly female who is alert and in no distress. Cardiac. Rate and rhythm regular with occasional irregular beat and 2/6 systolic ejection murmur. Lungs. Breath sounds are clear but reduced in the bases. Abdomen. Soft with positive bowel sounds. No pain with palpation. Impression/plan. Problem #1. Lower GI bleed. I do not visualize the stool or bright red blood but from nursing description this was a small amount. Possibly internal hemorrhoids but patient was reluctant to allow digital rectal exam. I discussed the case with Doctor Lovell SheehanJenkins supervising physician and we agree to continue heparin, aspirin, Plavix for the time being and monitor hemoglobin. I ordered hemoglobin/hematocrit every 8 hours for a total of 4 events. Nursing will notify of any further evidence of bleeding.

## 2013-10-07 NOTE — Progress Notes (Addendum)
ANTICOAGULATION CONSULT NOTE  Pharmacy Consult for Heparin  Indication: chest pain/ACS  Allergies  Allergen Reactions  . Codeine Rash  . Sulfa Antibiotics Other (See Comments)    unknown   Patient Measurements: Height: 5\' 4"  (162.6 cm) Weight: 117 lb 11.6 oz (53.4 kg) IBW/kg (Calculated) : 54.7  Vital Signs: Temp: 98.8 F (37.1 C) (02/08 1250) Temp src: Oral (02/08 1250) BP: 143/46 mmHg (02/08 1250) Pulse Rate: 59 (02/08 1250)  Labs:  Recent Labs  10/06/13 0504 10/06/13 1435 10/06/13 2116 10/06/13 2215 10/07/13 0330 10/07/13 1300 10/07/13 1305  HGB 10.6*  --   --   --  10.3* 10.7*  --   HCT 33.4*  --   --   --  32.7* 33.5*  --   PLT 203  --   --   --  207  --   --   LABPROT 15.4*  --   --   --   --   --   --   INR 1.25  --   --   --   --   --   --   HEPARINUNFRC  --   --   --  >2.20* 0.34  --  0.25*  CREATININE 2.98*  --   --   --   --   --   --   TROPONINI 10.42* 7.10* 3.93*  --   --   --   --     Estimated Creatinine Clearance: 14.8 ml/min (by C-G formula based on Cr of 2.98).  Medical History: Past Medical History  Diagnosis Date  . CHF (congestive heart failure)   . Diabetes mellitus   . Chronic kidney disease   . Hyperlipidemia   . Anemia   . Hypertension   . COPD (chronic obstructive pulmonary disease)   . Peripheral vascular disease   . Coronary artery disease   . Hx of echocardiogram 03/2011    showed a mean gradient that had increased to 41 and a peak gradient that had increase to 70, she has moderate tricuspid regurigitation, mild to moderate mitral regurigitation, and significant LA dilation.    Assessment: -73 [facility], HD patient. First HL of >2.2 was drawn right next to infusion, repeat HL this AM is 0.34, now 0.25 at 1305. Of note, per NP Lenny Pastelom Callahan, the pt had a small amount blood in her stool and requests that we aim for low end of heparin goal. Source likely hemorrhoidal and does not outweigh benefits  at this time.   Goal of Therapy:  Heparin level 0.3-0.7 units/ml-aim for low end per Lenny Pastelom Callahan, NP Monitor platelets by anticoagulation protocol: Yes   Plan:  Increase heparin infusion slightly to 750 units/hr 2300 HL Daily CBC/HL Monitor for bleeding F/U cardiology plans, cath anticipated for Monday  Lodie Waheed B. Artelia Larocheung, PharmD Clinical Pharmacist - Resident Phone: [phone] Pager: [phone] 10/07/2013 3:05 PM

## 2013-10-08 ENCOUNTER — Encounter (HOSPITAL_COMMUNITY)
Admission: EM | Disposition: A | Discharge status: Home / Self Care | Payer: Self-pay | Source: Other Acute Inpatient Hospital | Attending: Internal Medicine

## 2013-10-08 DIAGNOSIS — I2510 Atherosclerotic heart disease of native coronary artery without angina pectoris: Secondary | ICD-10-CM

## 2013-10-08 HISTORY — PX: LEFT HEART CATHETERIZATION WITH CORONARY ANGIOGRAM: SHX5451

## 2013-10-08 LAB — BASIC METABOLIC PANEL
BUN: 41 mg/dL — AB (ref 6–23)
CALCIUM: 9 mg/dL (ref 8.4–10.5)
CO2: 18 mEq/L — ABNORMAL LOW (ref 19–32)
Chloride: 90 mEq/L — ABNORMAL LOW (ref 96–112)
Creatinine, Ser: 5.64 mg/dL — ABNORMAL HIGH (ref 0.50–1.10)
GFR, EST AFRICAN AMERICAN: 8 mL/min — AB (ref >90)
GFR, EST NON AFRICAN AMERICAN: 7 mL/min — AB (ref >90)
GLUCOSE: 107 mg/dL — AB (ref 70–99)
POTASSIUM: 4.6 meq/L (ref 3.7–5.3)
Sodium: 129 mEq/L — ABNORMAL LOW (ref 137–147)

## 2013-10-08 LAB — CBC
HCT: 30.3 % — ABNORMAL LOW (ref 36.0–46.0)
HEMOGLOBIN: 9.7 g/dL — AB (ref 12.0–15.0)
MCH: 30.7 pg (ref 26.0–34.0)
MCHC: 32 g/dL (ref 30.0–36.0)
MCV: 95.9 fL (ref 78.0–100.0)
Platelets: 189 10*3/uL (ref 150–400)
RBC: 3.16 MIL/uL — ABNORMAL LOW (ref 3.87–5.11)
RDW: 16.1 % — AB (ref 11.5–15.5)
WBC: 8 10*3/uL (ref 4.0–10.5)

## 2013-10-08 LAB — GLUCOSE, CAPILLARY
GLUCOSE-CAPILLARY: 160 mg/dL — AB (ref 70–99)
Glucose-Capillary: 118 mg/dL — ABNORMAL HIGH (ref 70–99)
Glucose-Capillary: 85 mg/dL (ref 70–99)
Glucose-Capillary: 108 mg/dL — ABNORMAL HIGH (ref 70–99)

## 2013-10-08 LAB — HEPARIN LEVEL (UNFRACTIONATED): Heparin Unfractionated: 0.37 IU/mL (ref 0.30–0.70)

## 2013-10-08 LAB — PROTIME-INR
INR: 1.21 (ref 0.00–1.49)
Prothrombin Time: 15 seconds (ref 11.6–15.2)

## 2013-10-08 LAB — POCT ACTIVATED CLOTTING TIME: Activated Clotting Time: 127 seconds

## 2013-10-08 SURGERY — LEFT HEART CATHETERIZATION WITH CORONARY ANGIOGRAM
Anesthesia: LOCAL

## 2013-10-08 MED ORDER — HEPARIN SODIUM (PORCINE) 5000 UNIT/ML IJ SOLN
5000.0000 [IU] | Freq: Three times a day (TID) | INTRAMUSCULAR | Status: DC
  Administered 2013-10-08 – 2013-10-09 (×3): 5000 [IU] via SUBCUTANEOUS
  Filled 2013-10-08 (×6): qty 1

## 2013-10-08 MED ORDER — ACETAMINOPHEN 325 MG PO TABS: 650.0000 mg | ORAL_TABLET | ORAL | Status: DC | PRN

## 2013-10-08 MED ORDER — ONDANSETRON HCL 4 MG/2ML IJ SOLN
4.0000 mg | Freq: Four times a day (QID) | INTRAMUSCULAR | Status: DC | PRN
  Administered 2013-10-09: 4 mg via INTRAVENOUS

## 2013-10-08 MED ORDER — LIDOCAINE HCL (PF) 1 % IJ SOLN
INTRAMUSCULAR | Status: AC
  Filled 2013-10-08: qty 30

## 2013-10-08 MED ORDER — MIDAZOLAM HCL 2 MG/2ML IJ SOLN
INTRAMUSCULAR | Status: AC
  Filled 2013-10-08: qty 2

## 2013-10-08 MED ORDER — HEPARIN (PORCINE) IN NACL 2-0.9 UNIT/ML-% IJ SOLN
INTRAMUSCULAR | Status: AC
  Filled 2013-10-08: qty 1000

## 2013-10-08 MED ORDER — NITROGLYCERIN 0.2 MG/ML ON CALL CATH LAB
INTRAVENOUS | Status: AC
  Filled 2013-10-08: qty 1

## 2013-10-08 NOTE — Progress Notes (Signed)
  [facility] Progress Note   Subjective: No complaints  Filed Vitals:   10/08/13 0400 10/08/13 0500 10/08/13 0744 10/08/13 0800  BP: 144/51  158/41 160/46  Pulse: 60     Temp:   98.3 F (36.8 C)   TempSrc:   Oral   Resp: 19  22 21   Height:      Weight:  58.3 kg (128 lb 8.5 oz)    SpO2: 99%  98% 99%  Exam Alert, no distress lying flat No jvd Chest bibasilar fine rales, mostly clear RRR harsh SEM rusb, no rub or gallop Abd soft, nt/nd, no ascites Mild LE edema bilat pretibial LUA fistula patent Neuro is nf, ox3  Dialysis: MWF [facility] 3.5h  52kg   3K/2.25 [facility]  Heparin none  LUA AVF    Hect 2   EPO 1000   Venofer 100 mg/wk  Assessment: 1 NSTEMI, hx CABG / AS- for LHC today 2 ESRD, prob vol excess 3 Afib- in sinus rhythm, not AC candidate per cardiology, amio started 4 2HPTH, vit D, binders 5 Anemia, Hb 9's on esa, IV Fe 6 Aortic stenosis 7 COPD  Plan- HD today schedule around LHC, max UF , no hep   Vinson Moselleob Kienan Doublin MD pager [phone]    cell [phone] 10/08/2013, 9:22 AM   Recent Labs Lab 10/06/13 0504 10/08/13 0421  NA 138 129*  K 3.8 4.6  CL 96 90*  CO2 26 18*  GLUCOSE 207* 107*  BUN 11 41*  CREATININE 2.98* 5.64*  CALCIUM 8.9 9.0    Recent Labs Lab 10/06/13 0504  AST 45*  ALT 21  ALKPHOS 154*  BILITOT <0.2*  PROT 7.0  ALBUMIN 3.4*    Recent Labs Lab 10/06/13 0504 10/07/13 0330 10/07/13 1300 10/07/13 1958 10/08/13 0421  WBC 5.8 7.5  --   --  8.0  NEUTROABS 3.2  --   --   --   --   HGB 10.6* 10.3* 10.7* 9.8* 9.7*  HCT 33.4* 32.7* 33.5* 30.6* 30.3*  MCV 97.4 98.2  --   --  95.9  PLT 203 207  --   --  189   . amiodarone  200 mg Oral BID  . aspirin  81 mg Oral Daily  . atorvastatin  40 mg Oral q1800  . calcium acetate  1,334 mg Oral TID WC  . clopidogrel  75 mg Oral Q breakfast  . insulin aspart  0-5 Units Subcutaneous QHS  . insulin aspart  0-9 Units Subcutaneous TID WC  . multivitamin  1 tablet Oral QHS  . sodium  chloride  3 mL Intravenous Q12H  . sodium chloride  3 mL Intravenous Q12H   . sodium chloride 10 mL/hr (10/06/13 1329)  . heparin 800 Units/hr (10/08/13 0020)   sodium chloride, acetaminophen, acetaminophen, docusate sodium, ondansetron (ZOFRAN) IV, ondansetron, sodium chloride

## 2013-10-08 NOTE — Progress Notes (Signed)
    Subjective:  Feels weak. States 'haven't slept all night.' no shortness of breath or chest pain today. Just back from cath lab and having lunch now.  Objective:  Vital Signs in the last 24 hours: Temp:  [98.1 F (36.7 C)-100.3 F (37.9 C)] 98.2 F (36.8 C) (02/09 1350) Pulse Rate:  [60-66] 64 (02/09 1202) Resp:  [14-22] 18 (02/09 1350) BP: (119-171)/(33-51) 171/51 mmHg (02/09 1350) SpO2:  [94 %-100 %] 99 % (02/09 1350) Weight:  [128 lb 8.5 oz (58.3 kg)] 128 lb 8.5 oz (58.3 kg) (02/09 0500)  Intake/Output from previous day: 02/08 0701 - 02/09 0700 In: 692 [P.O.:360; I.V.:332] Out: -   Physical Exam: Pt is alert and oriented, chronically ill-appearing woman in NAD HEENT: normal Neck: JVP - normal Lungs: CTA bilaterally CV: RRR with grade 3/6 harsh systolic murmur at the RUSB, absent A2 Abd: soft, NT, Positive BS, no hepatomegaly Ext: no C/C/E Skin: warm/dry no rash   Lab Results:  Recent Labs  10/07/13 0330  10/07/13 1958 10/08/13 0421  WBC 7.5  --   --  8.0  HGB 10.3*  < > 9.8* 9.7*  PLT 207  --   --  189  < > = values in this interval not displayed.  Recent Labs  10/06/13 0504 10/08/13 0421  NA 138 129*  K 3.8 4.6  CL 96 90*  CO2 26 18*  GLUCOSE 207* 107*  BUN 11 41*  CREATININE 2.98* 5.64*    Recent Labs  10/06/13 1435 10/06/13 2116  TROPONINI 7.10* 3.93*    Cardiac Studies: CATH: IMPRESSIONS:  1. Widely patent left main coronary artery. 2. 70% mid left anterior descending artery stenosis with patent LIMA to LAD. 3. Large left circumflex artery with occluded large OM 1. SVG to OM is occluded. 4. Patent stents in the heavily calcified right coronary artery. Variable expansion of the previously placed stents due to heavy calcification. Occluded SVG to RCA. 5. Left ventricular systolic function not assessed. RECOMMENDATION: Continue medical therapy. No significant change in coronary anatomy from November 2014, post RCA intervention. I suspect  her non-STEMI was due to ischemia in the circumflex territory in the setting of atrial flutter with rapid ventricular response and severe aortic stenosis. She has many cardiac and other medical issues. There are no straightforward percutaneous options for revascularization. Hopefully, maintaining sinus rhythm will help avoid further ischemia.   Tele: Sinus rhythm  Assessment/Plan:  1. NSTEMI - severe multivessel disease with stable anatomy at cath today. Plan med Rx as recommended.  2. Atrial flutter, now in sinus on oral amiodarone. No anticoagulation with frequent falls. Continue ASA/plavix.  3. Severe AS - conservative therapy for now.  4. Chronic diastolic heart failure - hemodialysis for volume removal  5. ESRD - per nephrology   Tonny BollmanMichael Reeya Bound, M.D. 10/08/2013, 2:05 PM

## 2013-10-08 NOTE — Progress Notes (Signed)
[place] TEAM 1 - Stepdown/ICU TEAM Progress Note  [name] [name] DOB: 08-23-1941 DOA: 10/06/2013 PCP: Galvin Proffer, MD  Admit HPI / Brief Narrative: 73 yo F w/ hx of CAD with history of CABG x3 07/11/13, ESRD on HD MWF, DM, AS with pulmonary hypertension, PAF, HTN, Hyperlipidemia, COPD, and tobacco use who presented w/ chest pain.. She reported chest pain following HD so she was brought to [facility]. Patient was found to be tachycardic with a heart rate of 140s and received 1L of IVF. She subsequently converted to sinus bradycardia. She also had positive troponin of 0.62.  She was transferred to [facility] for further care and management.   HPI/Subjective: Pt has no complaints at present, other than being hungry.  She denies current cp or so.  No n/v, or abdom pain.    Assessment/Plan:  Chest pain / NSTEMI / Hx CAD s/p CABG Dec 2014 -as per Cardiology - for cath today - on heparin gtt -trop peaked at 10.42 -2-D echocardiogram on 07/07/2013 showed EF of 45-50%, probable moderate hypokinesis of the basal-mid inferolateral myocardium, grade 3 diastolic dysfunction, moderately calcified annulus of aortic valve with severe stenosis, mitral valve moderately calcified with mild regurgitation, dilated on left atrium -not a candidate for redo CABG with porcelain aorta and severe COPD  Tachy/Lagerstrom - Parox atrial fib/flutter Patient had brief episode of atrial tachycardia/SVT and converted on her own back to sinus bradycardia - Cards following - amio started - not anticoag candidate per Cards due to freq falls  Severe aortic stenosis  Stable - management as per Cardiology - conservative management only option   Chronic diastolic CHF Volume management per HD   Presumed hemorrhoidal bleed  Patient had some red blood on stool 2/7 - Hgb has been stable - no indication for further testing at this time   COPD Well compensated at this time   Diabetes  A1c is 8.5, but CBG  here well controlled - follow trend w/o change today   Hypertension  BP has been trending up - pt due for HD today - follow post HD w/o med changes yet   Hyperlipidemia  Continue statin  End stage renal disease  Dialysis on M, W, F - Nephrology following   Code Status: FULL Family Communication: no family present at time of exam Disposition Plan: SDU > tele bed   Consultants: Cardiology Nephrology  Procedures: 2/9 - cardiac cath - pending  2/7 - TTE - EF 60-65% - no WMA - severe AoS   Antibiotics: none  DVT prophylaxis: IV heparin  Objective: Blood pressure 146/48, pulse 60, temperature 98.3 F (36.8 C), temperature source Oral, resp. rate 17, height 5\' 4"  (1.626 m), weight 58.3 kg (128 lb 8.5 oz), SpO2 99.00%.  Intake/Output Summary (Last 24 hours) at 10/08/13 1109 Last data filed at 10/08/13 1000  Gross per 24 hour  Intake    473 ml  Output      0 ml  Net    473 ml   Exam: General: No acute respiratory distress Lungs: Clear to auscultation bilaterally without wheezes or crackles Cardiovascular: bradycardic at 60bpm - prominent 3/6 holosystolic M - no rub  Abdomen: Nontender, nondistended, soft, bowel sounds positive, no rebound, no ascites, no appreciable mass Extremities: No significant cyanosis, clubbing, or edema bilateral lower extremities  Data Reviewed: Basic Metabolic Panel:  Recent Labs Lab 10/06/13 0504 10/08/13 0421  NA 138 129*  K 3.8 4.6  CL 96 90*  CO2  26 18*  GLUCOSE 207* 107*  BUN 11 41*  CREATININE 2.98* 5.64*  CALCIUM 8.9 9.0  MG 2.0  --    Liver Function Tests:  Recent Labs Lab 10/06/13 0504  AST 45*  ALT 21  ALKPHOS 154*  BILITOT <0.2*  PROT 7.0  ALBUMIN 3.4*   CBC:  Recent Labs Lab 10/06/13 0504 10/07/13 0330 10/07/13 1300 10/07/13 1958 10/08/13 0421  WBC 5.8 7.5  --   --  8.0  NEUTROABS 3.2  --   --   --   --   HGB 10.6* 10.3* 10.7* 9.8* 9.7*  HCT 33.4* 32.7* 33.5* 30.6* 30.3*  MCV 97.4 98.2  --   --  95.9   PLT 203 207  --   --  189   Cardiac Enzymes:  Recent Labs Lab 10/06/13 0504 10/06/13 1435 10/06/13 2116  TROPONINI 10.42* 7.10* 3.93*   CBG:  Recent Labs Lab 10/07/13 0843 10/07/13 1254 10/07/13 1648 10/07/13 2152 10/08/13 0741  GLUCAP 187* 141* 207* 118* 118*    Recent Results (from the past 240 hour(s))  MRSA PCR SCREENING     Status: None   Collection Time    10/06/13  4:14 AM      Result Value Range Status   MRSA by PCR NEGATIVE  NEGATIVE Final   Comment:            The GeneXpert MRSA Assay (FDA     approved for NASAL specimens     only), is one component of a     comprehensive MRSA colonization     surveillance program. It is not     intended to diagnose MRSA     infection nor to guide or     monitor treatment for     MRSA infections.     Studies:  Recent x-ray studies have been reviewed in detail by the Attending Physician  Scheduled Meds:  Scheduled Meds: . amiodarone  200 mg Oral BID  . aspirin  81 mg Oral Daily  . atorvastatin  40 mg Oral q1800  . calcium acetate  1,334 mg Oral TID WC  . clopidogrel  75 mg Oral Q breakfast  . insulin aspart  0-5 Units Subcutaneous QHS  . insulin aspart  0-9 Units Subcutaneous TID WC  . multivitamin  1 tablet Oral QHS  . sodium chloride  3 mL Intravenous Q12H  . sodium chloride  3 mL Intravenous Q12H    Time spent on care of this patient: 35 mins   Ascension [facility] Clares HospitalMCCLUNG,Zilla Shartzer T  [facility] Office  [phone] Pager - Text Page per Loretha StaplerAmion as per below:  On-Call/Text Page:      [email or web address]      password [email or web address]  If 7PM-7AM, please contact night-coverage [email or web address] Password [email or web address] 10/08/2013, 11:09 AM   LOS: 2 days

## 2013-10-08 NOTE — Interval H&P Note (Signed)
Cath Lab Visit (complete for each Cath Lab visit)  Clinical Evaluation Leading to the Procedure:   ACS: yes  Non-ACS:    Anginal Classification: CCS IV  Anti-ischemic medical therapy: Minimal Therapy (1 class of medications)  Non-Invasive Test Results: No non-invasive testing performed  Prior CABG: Previous CABG      History and Physical Interval Note:  10/08/2013 11:48 AM  [name]  has presented today for surgery, with the diagnosis of cp  The various methods of treatment have been discussed with the patient and family. After consideration of risks, benefits and other options for treatment, the patient has consented to  Procedure(s): LEFT HEART CATHETERIZATION WITH CORONARY ANGIOGRAM (N/A) as a surgical intervention .  The patient's history has been reviewed, patient examined, no change in status, stable for surgery.  I have reviewed the patient's chart and labs.  Questions were answered to the patient's satisfaction.     Lonnie Rosado S.

## 2013-10-08 NOTE — Progress Notes (Addendum)
ANTICOAGULATION CONSULT NOTE  Pharmacy Consult for Heparin  Indication: chest pain/ACS  Allergies  Allergen Reactions  . Codeine Rash  . Sulfa Antibiotics Other (See Comments)    unknown   Patient Measurements: Height: 5\' 4"  (162.6 cm) Weight: 117 lb 11.6 oz (53.4 kg) IBW/kg (Calculated) : 54.7  Vital Signs: Temp: 100.3 F (37.9 C) (02/08 2018) Temp src: Oral (02/08 2018) BP: 119/39 mmHg (02/08 2253) Pulse Rate: 66 (02/08 2018)  Labs:  Recent Labs  10/06/13 0504 10/06/13 1435 10/06/13 2116  10/07/13 0330 10/07/13 1300 10/07/13 1305 10/07/13 1958 10/07/13 2250  HGB 10.6*  --   --   --  10.3* 10.7*  --  9.8*  --   HCT 33.4*  --   --   --  32.7* 33.5*  --  30.6*  --   PLT 203  --   --   --  207  --   --   --   --   LABPROT 15.4*  --   --   --   --   --   --   --   --   INR 1.25  --   --   --   --   --   --   --   --   HEPARINUNFRC  --   --   --   < > 0.34  --  0.25*  --  0.29*  CREATININE 2.98*  --   --   --   --   --   --   --   --   TROPONINI 10.42* 7.10* 3.93*  --   --   --   --   --   --   < > = values in this interval not displayed.  Estimated Creatinine Clearance: 14.8 ml/min (by C-G formula based on Cr of 2.98).  Medical History: Past Medical History  Diagnosis Date  . CHF (congestive heart failure)   . Diabetes mellitus   . Chronic kidney disease   . Hyperlipidemia   . Anemia   . Hypertension   . COPD (chronic obstructive pulmonary disease)   . Peripheral vascular disease   . Coronary artery disease   . Hx of echocardiogram 03/2011    showed a mean gradient that had increased to 41 and a peak gradient that had increase to 70, she has moderate tricuspid regurigitation, mild to moderate mitral regurigitation, and significant LA dilation.    Assessment: -73 [facility]ACS -HD patient -HL is 0.29, slight drop in Hgb to 9.8 (continue to monitor) -Of note, per NP Lenny Pastelom Callahan, the pt had a small amount blood in her stool  and requests that we aim for low end of heparin goal (per RN tonight, continue to have some blood in stool, will continue to monitor)  Goal of Therapy:  Heparin level 0.3-0.7 units/ml-aim for low end per Lenny Pastelom Callahan, NP Monitor platelets by anticoagulation protocol: Yes   Plan:  -Increase heparin drip slightly to 800 units/hr given aiming for low end of goal -0900 HL -Daily CBC/HL -Monitor for bleeding, trend Hgb -F/U cardiology plans  Abran DukeLedford, James 10/08/2013,12:13 AM  12:14 PM  Heparin level drawn prior to the cath lab was within goal on 800 units/hr.  Follow up after cath to determine ongoing anticoagulation plans.  Thank you for allowing pharmacy to be a part of this patients care team.  Lovenia KimJulie Americus Perkey Pharm.D., BCPS Clinical Pharmacist 10/08/2013 12:15 PM Pager: [phone] Phone: [phone]

## 2013-10-08 NOTE — Progress Notes (Signed)
10/08/2013  1120  To cath lab.  [name], [name]

## 2013-10-08 NOTE — Progress Notes (Signed)
10/08/2013 1420  To Dialysis   BR til 172  Rt fem site level 0.  Observed by this nurse and dialysis nurse. Gabriela Giannelli, [name]

## 2013-10-08 NOTE — H&P (View-Only) (Signed)
Patient ID: [name], female   DOB: 1941/08/15, 73 y.o.   MRN: [id number]    SUBJECTIVE: Doing ok this morning.  No chest pain.  No further arrythmias on telemetry.  Troponin peaked at 10.    . amiodarone  200 mg Oral BID  . aspirin  81 mg Oral Daily  . atorvastatin  40 mg Oral q1800  . calcium acetate  1,334 mg Oral TID WC  . clopidogrel  75 mg Oral Q breakfast  . insulin aspart  0-5 Units Subcutaneous QHS  . insulin aspart  0-9 Units Subcutaneous TID WC  . multivitamin  1 tablet Oral QHS  . sodium chloride  3 mL Intravenous Q12H  heparin gtt    Filed Vitals:   10/07/13 0324 10/07/13 0342 10/07/13 0400 10/07/13 0840  BP: 121/46  150/46 143/42  Pulse: 64   59  Temp:  99 F (37.2 C)  98.6 F (37 C)  TempSrc:  Oral  Oral  Resp: 20  20 20   Height:      Weight:      SpO2: 97%  100% 100%    Intake/Output Summary (Last 24 hours) at 10/07/13 0855 Last data filed at 10/07/13 0600  Gross per 24 hour  Intake 1412.75 ml  Output      2 ml  Net 1410.75 ml    LABS: Basic Metabolic Panel:  Recent Labs  08/65/7801/03/13 0504  NA 138  K 3.8  CL 96  CO2 26  GLUCOSE 207*  BUN 11  CREATININE 2.98*  CALCIUM 8.9  MG 2.0   Liver Function Tests:  Recent Labs  10/06/13 0504  AST 45*  ALT 21  ALKPHOS 154*  BILITOT <0.2*  PROT 7.0  ALBUMIN 3.4*   No results found for this basename: LIPASE, AMYLASE,  in the last 72 hours CBC:  Recent Labs  10/06/13 0504 10/07/13 0330  WBC 5.8 7.5  NEUTROABS 3.2  --   HGB 10.6* 10.3*  HCT 33.4* 32.7*  MCV 97.4 98.2  PLT 203 207   Cardiac Enzymes:  Recent Labs  10/06/13 0504 10/06/13 1435 10/06/13 2116  TROPONINI 10.42* 7.10* 3.93*   BNP: No components found with this basename: POCBNP,  D-Dimer: No results found for this basename: DDIMER,  in the last 72 hours Hemoglobin A1C:  Recent Labs  10/06/13 1400  HGBA1C 8.5*   Fasting Lipid Panel:  Recent Labs  10/06/13 0615  CHOL 122  HDL 89  LDLCALC 26  TRIG 36    CHOLHDL 1.4   Thyroid Function Tests: No results found for this basename: TSH, T4TOTAL, FREET3, T3FREE, THYROIDAB,  in the last 72 hours Anemia Panel: No results found for this basename: VITAMINB12, FOLATE, FERRITIN, TIBC, IRON, RETICCTPCT,  in the last 72 hours  RADIOLOGY: No results found.  PHYSICAL EXAM General: NAD Neck: JVP 12 cm, no thyromegaly or thyroid nodule.  Lungs: Decreased breath sounds right base.  CV: Nondisplaced PMI.  Heart regular S1/S2, no S3/S4, 3/6 systolic murmur RUSB with obscured S2.  1+ edema 1/2 to knees bilaterally.   Abdomen: Soft, nontender, no hepatosplenomegaly, no distention.  Neurologic: Alert and oriented x 3.  Psych: Normal affect. Extremities: No clubbing or cyanosis.   TELEMETRY: Reviewed telemetry NSR in 60s:  ASSESSMENT AND PLAN: 73 yo with history of severe COPD, CAD s/p CABG, ESRD, at least moderate AS, and history of paroxysmal atrial fibrillation presented with chest pain after HD.  She was noted to have SVT in hospital (probably  atrial flutter).   1. CAD: s/p CABG and intervention to native RCA in 12/14.  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  ECG shows RBBB, same as past.  Troponin to 10.  No further chest pain.  NSTEMI.  It is not inconceivable that this was a demand ischemia event given noted atrial flutter with RVR.  However, she has known severe CAD.  - Continue ASA, statin, Plavix.  - On heparin gtt - Plan for [facility] on Monday as she is currently pain-free. 2. Atrial flutter: Suspect patient was in atrial flutter at admission and Friday night.  Has history of PAF.  May be cause of demand ischemia, especially in setting of severe aortic stenosis.  HR in the 60s, NSR currently.  I started her on amiodarone 200 mg bid to try to keep her in NSR.  She has COPD but I really think that she is going to tolerate atrial arrhythmias very poorly with her AS, will need to keep ultimate amiodarone dose as low as  possible.  - Probably not anticoagulation candidate with frequent falls.  3. ESRD: Will need HD arranged on Monday in addition to cardiac cath.  4. Chronic diastolic CHF: EF 21-30% with severe AS.  She does appear to have some volume overload on exam but is comfortable, will need to be controlled by HD.  5. Aortic stenosis: Severe by echo yesterday.  Not candidate for AVR with COPD and porcelain aorta.  Poor TAVR candidate with ESRD.  6. Decreased breath sounds/crackles right base corresponds to right pleural effusion.   7. GI: Patient had some red blood around stool yesterday, may be hemorrhoids.  Hemoglobin stable.  Follow.   Marca Ancona 10/07/2013 8:55 AM

## 2013-10-08 NOTE — CV Procedure (Addendum)
    PROCEDURE:  Selective coronary angiography, bypass angiography.  INDICATIONS:  Non-STEMI  The risks, benefits, and details of the procedure were explained to the patient.  The patient verbalized understanding and wanted to proceed.  Informed written consent was obtained.  PROCEDURE TECHNIQUE:  After Xylocaine anesthesia a 26F sheath was placed in the right femoral artery with a single anterior needle wall stick.   Left coronary angiography was done using a Judkins L4 guide catheter.  Right coronary angiography was done using a Judkins R4 guide catheter.  SVG angiography was done with the JR 4 catheter. The LIMA catheter was used to engage the left internal mammary graft. Manual compression will be used for hemostasis.   CONTRAST:  Total of 105 cc.  COMPLICATIONS:  None.    HEMODYNAMICS:  Aortic pressure was 140/51    ANGIOGRAPHIC DATA:   The left main coronary artery is a large vessel which is widely patent..  The left anterior descending artery is a large vessel which wraps around the apex. There several diagonal vessels proximally which are medium size and moderately diseased. There is a larger diagonal off the mid LAD which is patent. After this large diagonal, there is a focal 70% mid LAD stenosis. There is some mild competitive flow noted in distal LAD.  The LIMA to LAD is patent..  The left circumflex artery is a large vessel proximally. There is an OM 1 which is occluded in the midportion at the distal edge of an apparent stent. There are minimal collaterals to this distribution.  The SVG to circumflex system is occluded.  The SVG to RCA system is occluded.  The right coronary artery is a large dominant vessel. There are multiple stents throughout the vessel. The posterior lateral artery is medium-sized and patent. The posterior descending artery is medium size with moderate disease. In the mid vessel, there is a portion of the stented segment which appears under dilated. In  looking back at the old report, this area was post dilated with a 3.75 balloon to high pressure. Due to calcification, the stent did not expand any more. There is TIMI 3 flow. In the cranial view, the unemployment does not appear to be as significant.  LEFT VENTRICULOGRAM:  Left ventricular angiogram was not done due to the known severe aortic stenosis.  IMPRESSIONS:  1. Widely patent left main coronary artery. 2. 70% mid left anterior descending artery stenosis with patent LIMA to LAD. 3. Large left circumflex artery with occluded large OM 1. SVG to OM is occluded. 4. Patent stents in the heavily calcified right coronary artery.  Variable expansion of the previously placed stents due to heavy calcification.  Occluded SVG to RCA. 5. Left ventricular systolic function not assessed.  RECOMMENDATION:  Continue medical therapy. No significant change in coronary anatomy from November 2014, post RCA intervention. I suspect her non-STEMI was due to ischemia in the circumflex territory in the setting of atrial flutter with rapid ventricular response and severe aortic stenosis. She has many cardiac and other medical issues. There are no straightforward percutaneous options for revascularization. Hopefully, maintaining sinus rhythm will help avoid further ischemia.

## 2013-10-09 DIAGNOSIS — E785 Hyperlipidemia, unspecified: Secondary | ICD-10-CM

## 2013-10-09 DIAGNOSIS — R0602 Shortness of breath: Secondary | ICD-10-CM | POA: Diagnosis present

## 2013-10-09 LAB — CBC
HEMATOCRIT: 30.2 % — AB (ref 36.0–46.0)
Hemoglobin: 9.8 g/dL — ABNORMAL LOW (ref 12.0–15.0)
MCH: 30.6 pg (ref 26.0–34.0)
MCHC: 32.5 g/dL (ref 30.0–36.0)
MCV: 94.4 fL (ref 78.0–100.0)
Platelets: 216 10*3/uL (ref 150–400)
RBC: 3.2 MIL/uL — AB (ref 3.87–5.11)
RDW: 15.8 % — AB (ref 11.5–15.5)
WBC: 6.9 10*3/uL (ref 4.0–10.5)

## 2013-10-09 LAB — GLUCOSE, CAPILLARY
GLUCOSE-CAPILLARY: 88 mg/dL (ref 70–99)
GLUCOSE-CAPILLARY: 84 mg/dL (ref 70–99)
GLUCOSE-CAPILLARY: 93 mg/dL (ref 70–99)
GLUCOSE-CAPILLARY: 104 mg/dL — AB (ref 70–99)

## 2013-10-09 MED ORDER — AMIODARONE HCL 200 MG PO TABS: 200.0000 mg | ORAL_TABLET | Freq: Two times a day (BID) | ORAL | Status: DC

## 2013-10-09 NOTE — Progress Notes (Signed)
Subjective: No CP or SOB Objective: Filed Vitals:   10/08/13 1810 10/08/13 1901 10/08/13 2009 10/09/13 0500  BP: 148/59 149/49  126/47  Pulse: 105   76  Temp: 98.6 F (37 C)  101.1 F (38.4 C) 99.1 F (37.3 C)  TempSrc: Oral  Oral Oral  Resp: 17   18  Height:      Weight: 116 lb 2.9 oz (52.7 kg)   115 lb 1.3 oz (52.2 kg)  SpO2: 100% 98%  92%   Weight change: -12 lb 5.5 oz (-5.6 kg)  Intake/Output Summary (Last 24 hours) at 10/09/13 0830 Last data filed at 10/08/13 1810  Gross per 24 hour  Intake    174 ml  Output   3900 ml  Net  -3726 ml    General: Alert, awake, oriented x3, in no acute distress Neck:  JVP is normal Heart: Regular rate and rhythm, Gr iii/vi systolic murur base Lungs: Clear to auscultation.  No rales or wheezes. Exemities:  No edema.   Neuro: Grossly intact, nonfocal.   Lab Results: Results for orders placed during the hospital encounter of 10/06/13 (from the past 24 hour(s))  HEPARIN LEVEL (UNFRACTIONATED)     Status: None   Collection Time    10/08/13 10:38 AM      Result Value Range   Heparin Unfractionated 0.37  0.30 - 0.70 IU/mL  POCT ACTIVATED CLOTTING TIME     Status: None   Collection Time    10/08/13 12:31 PM      Result Value Range   Activated Clotting Time 127    GLUCOSE, CAPILLARY     Status: None   Collection Time    10/08/13 12:52 PM      Result Value Range   Glucose-Capillary 88  70 - 99 mg/dL  GLUCOSE, CAPILLARY     Status: None   Collection Time    10/08/13  1:52 PM      Result Value Range   Glucose-Capillary 85  70 - 99 mg/dL  GLUCOSE, CAPILLARY     Status: Abnormal   Collection Time    10/08/13  9:51 PM      Result Value Range   Glucose-Capillary 108 (*) 70 - 99 mg/dL  CBC     Status: Abnormal   Collection Time    10/09/13  2:40 AM      Result Value Range   WBC 6.9  4.0 - 10.5 K/uL   RBC 3.20 (*) 3.87 - 5.11 MIL/uL   Hemoglobin 9.8 (*) 12.0 - 15.0 g/dL   HCT 16.1 (*) 09.6 - 04.5 %   MCV 94.4  78.0 - 100.0 fL   MCH 30.6  26.0 - 34.0 pg   MCHC 32.5  30.0 - 36.0 g/dL   RDW 40.9 (*) 81.1 - 91.4 %   Platelets 216  150 - 400 K/uL  GLUCOSE, CAPILLARY     Status: None   Collection Time    10/09/13  6:17 AM      Result Value Range   Glucose-Capillary 84  70 - 99 mg/dL    Studies/Results: @RISRSLT24 @  Medications: Reviewed   @PROBHOSP @  1.  NSTEMI  Cath yesterday showed:  70% mild LAD  Patent LIMA to LAD;  LCx 100^  SVG to OM occluded; Patent stent to RCA.  Heavy calcification.  SVG to RCA occluded.   Plan for medical Rx.    2.  Atrial flutter On amiodarone  No anticoag due to freq falls  ASA  and plavix  3.  AS  Severe  Will follow  4.  Diastolic CHF  Gets dialysis.  5   Renal  On dialysis.   Will make sure patient has f/u with [facility].    LOS: 3 days   Dietrich PatesPaula Salina Stanfield 10/09/2013, 8:30 AM

## 2013-10-09 NOTE — Progress Notes (Signed)
Patient has been on 2L while in the hospital; O2 sats without 2L is 85% at rest; paged MD for order for home O2.  BARNETT, Geroge BasemanHEATHER M

## 2013-10-09 NOTE — Progress Notes (Signed)
SATURATION QUALIFICATIONS: (This note is used to comply with regulatory documentation for home oxygen)  Patient Saturations on Room Air at Rest = 84-85%  Patient Saturations on Room Air while Ambulating = NA%  Patient Saturations on NA Liters of oxygen while Ambulating = NA%  Please briefly explain why patient needs home oxygen: Patient required 2L of 02 to maintain o2 sats greater than 90% while at hospital. O2 sats while patient at rest 84-85%. Patient will need home 02 to maintain a normal 02 SAT. [name], [name]

## 2013-10-09 NOTE — Discharge Summary (Addendum)
Physician Discharge Summary  GERRIANNE AYDELOTT ZOX:[id number] DOB: 1941/06/12 DOA: 10/06/2013  PCP: Galvin Proffer, MD  Admit date: 10/06/2013 Discharge date: 10/09/2013  Time spent: 45 minutes  Recommendations for Outpatient Follow-up:   Chest pain / NSTEMI / Hx CAD s/p CABG Dec 2014  -Cardiac catheterization on 2/9 Cath shows continued diastolic CHF with moderate -severe multivalve disease. 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 Nicki Guadalajara (cardiology)  Pulmonary hypertension -See chest pain/NSTEMI   Tachy/[place]ell - Parox atrial fib/flutter  -Continue amiodarone per cardiology -Not candidate for anticoagulations secondary to frequent falls -Continue Plavix + aspirin  Severe aortic stenosis  Stable - management as per Cardiology - conservative management only option   Chronic diastolic CHF  Volume management per HD   Presumed hemorrhoidal bleed  -Patient had some red blood on stool 2/7  - Hgb has been stable  - no indication for further testing at this time   COPD  -Well compensated at this time   SOB SATURATION QUALIFICATIONS: (This note is used to comply with regulatory documentation for home oxygen)  Patient Saturations on Room Air at Rest = 84-85%  Patient Saturations on Room Air while Ambulating = NA%  Patient Saturations on NA Liters of oxygen while Ambulating = NA%  Please briefly explain why patient needs home oxygen: Patient required 2L of 02 to maintain o2 sats greater  than 90% while at hospital. O2 sats while patient at rest 84-85%. Patient will need home 02 to maintain a normal 02 SAT. [facility]  -Patient meets criteria for home O2 24 hours per day. -Start patient at 2 L O2 via [place] titrate to maintain SpO2 89-92%  Diabetes  -Hemoglobin A1C= 8.5, consultation to address with PCP in order to obtain better control. -Hemoglobin A1c goal<7   Hypertension  -BP within AHA/ADA guidelines     Hyperlipidemia  -Continue Lipitor 40mg   daily    End stage renal disease  Dialysis on M, W, F - Nephrology following      Discharge Diagnoses:  Principal Problem:   Chest pain Active Problems:   End stage renal disease- HD MWF in [place]   NSTEMI 07/04/13 and 10/06/2013   DM (diabetes mellitus)   HTN (hypertension)   HLD (hyperlipidemia)   COPD (chronic obstructive pulmonary disease)   Aortic stenosis, moderate at cath 07/11/13   PAF (paroxysmal atrial fibrillation)   CAD- CABG X 3 09/2009, HSRA/DES to native RCA 07/11/13   Smoker   SOB (shortness of breath)   Discharge Condition: Stable  Diet recommendation: ADA/AHA  Filed Weights   10/08/13 0500 10/08/13 1810 10/09/13 0500  Weight: 58.3 kg (128 lb 8.5 oz) 52.7 kg (116 lb 2.9 oz) 52.2 kg (115 lb 1.3 oz)    History of present illness:  [name] is a 73 y.o. BF PMHx NSTEMI, CAD with history of CABG x3 by Dr. Morton Peters with LIMA to LAD, vein to the circumflex, and vein to PDA in 2011, HSRA/DES to native RCA 07/11/13, ESRD on HD MWF, DM, AS with pulmonary hypertension, PAF, HTN,  Hyperlipidemia, COPD, and tobacco use who presents with the above complaints. Patient is a poor historian, she was able to provide some history. She indicated that she had dialysis yesterday morning at 6 a.m. like she normally does. Then she had chest pain sometime thereafter, she is not sure if she had chest pain in the afternoon or evening, but thinks it was in the evening. She continued to have chest pain until she was  brought to [facility]. Patient was found to be tachycardic with a heart rate of 140s, she received 2 fluid boluses of normal saline 500 cc. She subsequently converted to sinus bradycardia. She also had positive troponin of 0.62, she was transferred to  for further care and management. Currently she denies any chest pain. Denies any recent fevers, chills, shortness of breath, abdominal pain, diarrhea, headaches or vision changes. Patient complained of cough few weeks ago but since then has resolved. Patient reports that she quit smoking recently but could not specify when she quit smoking. She did complain of some jaw pain with the chest pain. Also complaining of left arm pain. Denied any diaphoresis. Currently denies any jaw pain or left arm pain. 2/10 S/P catheterization on 2/9 per cardiology progressive intervention at this time would continue patient on aspirin/Plavix and hemodialysis for volume overload secondary to chronic diastolic heart failure. Patient did receive her scheduled dialysis yesterday.     Consultants:  Dr Everette Rank (cardiology)  Dr. Delano Metz (nephrology)    Procedures:  PTCA 10/08/2013  NSTEMI Cath: - 70% mild LAD Patent LIMA to LAD;  -LCx 100^ SVG to OM occluded;  -Patent stent to RCA. Heavy calcification.  -SVG to RCA occluded.   Echocardiogram 10/06/2013 - Left ventricle: The cavity size was normal. Moderate concentric hypertrophy. - Systolic function was normal.  -LVEF= 60% to 65%. Wall motion was normal;  -Doppler parameters c/w restrictive physiology, indicative of decreased left ventricular diastolic compliance and/or increased left atrial pressure. - Aortic valve: There was moderate to severe stenosis. Valve area: 0.45cm^2(VTI). Valve area: 0.45cm^2 (Vmax). - Mitral valve: Calcified annulus. Mildly thickened, mildly calcified leaflets . Mild regurgitation directed centrally. Valve area by continuity equation (using LVOT flow): 0.91cm^2. -  Left atrium: The atrium was mildly dilated.  - Right ventricle: The cavity size was mildly dilated. Systolic function was moderately to severely reduced. - Right atrium: The atrium was mildly dilated. - Tricuspid valve: Moderate-severe regurgitation directed centrally. The acceleration rate of the regurgitant jet was reduced, consistent with a low dP/dt. - Pulmonary arteries: PA peak pressure: 52mm Hg (S).        Antibiotics:    Discharge Exam: Filed Vitals:   10/09/13 1031 10/09/13 1425 10/09/13 1530 10/09/13 1540  BP: 128/43 120/47    Pulse: 67 70    Temp:  98.6 F (37 C)    TempSrc:  Oral    Resp:  18    Height:      Weight:      SpO2: 95% 96% 99% 85%    General: A./O. x4, NAD Cardiovascular: Regular rhythm and rate, 4/5 holosystolic murmur, negative rubs or gallops, DP/PT pulse one plus bilateral Respiratory: Clear to auscultation bilateral  Discharge Instructions     Medication List    STOP taking these medications       diltiazem 120 MG 24 hr capsule  Commonly known as:  DILACOR XR     metoprolol tartrate 25 MG tablet  Commonly known as:  [employer]  TAKE these medications       amiodarone 200 MG tablet  Commonly known as:  PACERONE  Take 1 tablet (200 mg total) by mouth 2 (two) times daily.     aspirin 81 MG chewable tablet  Chew 1 tablet (81 mg total) by mouth daily.     atorvastatin 40 MG tablet  Commonly known as:  LIPITOR  Take 1 tablet (40 mg total) by mouth daily at 6 PM.     clopidogrel 75 MG tablet  Commonly known as:  PLAVIX  Take 1 tablet (75 mg total) by mouth daily with breakfast.     insulin NPH-regular Human (70-30) 100 UNIT/ML injection  Commonly known as:  NOVOLIN 70/30  Inject 10 Units into the skin daily as needed (when blood sugar is low). As directed per sliding scale     multivitamin Tabs tablet  Take 1 tablet by mouth at bedtime.     nitroGLYCERIN 0.4 MG SL tablet  Commonly known as:  NITROSTAT  Place 1 tablet  (0.4 mg total) under the tongue every 5 (five) minutes x 3 doses as needed for chest pain.       Allergies  Allergen Reactions  . Codeine Rash  . Sulfa Antibiotics Other (See Comments)    unknown   Follow-up Information   Follow up with HAGUE, Myrene GalasIMRAN P, MD In 1 week.   Specialty:  Internal Medicine   Contact information:   [address] [place] [address] [phone]       Follow up with Nicki GuadalajaraKELLY,THOMAS A, MD In 2 weeks.   Specialty:  Cardiology   Contact information:   [address] [place] [address] [phone]        The results of significant diagnostics from this hospitalization (including imaging, microbiology, ancillary and laboratory) are listed below for reference.    Significant Diagnostic Studies: Dg Chest 2 View  10/06/2013   CLINICAL DATA:  History of CHF, diabetes, hypertension, COPD  EXAM: CHEST  2 VIEW  COMPARISON:  DG CHEST PORTABLE dated 10/05/2013  FINDINGS: The cardiac silhouette is enlarged. A stable moderate sized right pleural effusions appreciated. There is slight increase conspicuity of lung parenchymal density in the aerated right lung base. No further focal regions of consolidation or focal infiltrates appreciated. Patient is status post median sternotomy and coronary artery bypass grafting. The osseous structures unremarkable. There is also slight decrease in conspicuity of interstitial findings.  IMPRESSION: Stable right pleural effusion. Slight increase conspicuity of the atelectasis versus infiltrate right lung base. Findings which also may reflect mild decreased pulmonary edema. Surveillance evaluation recommended.   Electronically Signed   By: Salome HolmesHector  Cooper M.D.   On: 10/06/2013 13:07    Microbiology: Recent Results (from the past 240 hour(s))  MRSA PCR SCREENING     Status: None   Collection Time    10/06/13  4:14 AM      Result Value Range Status   MRSA by PCR NEGATIVE  NEGATIVE Final   Comment:            The  GeneXpert MRSA Assay (FDA     approved for NASAL specimens     only), is one component of a     comprehensive MRSA colonization     surveillance program. It is not     intended to diagnose MRSA     infection nor to guide or     monitor treatment for     MRSA infections.  Labs: Basic Metabolic Panel:  Recent Labs Lab 10/06/13 0504 10/08/13 0421  NA 138 129*  K 3.8 4.6  CL 96 90*  CO2 26 18*  GLUCOSE 207* 107*  BUN 11 41*  CREATININE 2.98* 5.64*  CALCIUM 8.9 9.0  MG 2.0  --    Liver Function Tests:  Recent Labs Lab 10/06/13 0504  AST 45*  ALT 21  ALKPHOS 154*  BILITOT <0.2*  PROT 7.0  ALBUMIN 3.4*   No results found for this basename: LIPASE, AMYLASE,  in the last 168 hours No results found for this basename: AMMONIA,  in the last 168 hours CBC:  Recent Labs Lab 10/06/13 0504 10/07/13 0330 10/07/13 1300 10/07/13 1958 10/08/13 0421 10/09/13 0240  WBC 5.8 7.5  --   --  8.0 6.9  NEUTROABS 3.2  --   --   --   --   --   HGB 10.6* 10.3* 10.7* 9.8* 9.7* 9.8*  HCT 33.4* 32.7* 33.5* 30.6* 30.3* 30.2*  MCV 97.4 98.2  --   --  95.9 94.4  PLT 203 207  --   --  189 216   Cardiac Enzymes:  Recent Labs Lab 10/06/13 0504 10/06/13 1435 10/06/13 2116  TROPONINI 10.42* 7.10* 3.93*   BNP: BNP (last 3 results) No results found for this basename: PROBNP,  in the last 8760 hours CBG:  Recent Labs Lab 10/08/13 1252 10/08/13 1352 10/08/13 2151 10/09/13 0617 10/09/13 1122  GLUCAP 88 85 108* 84 93       Signed:  Carolyne Littles, MD [facility] [phone] pager

## 2013-10-09 NOTE — Care Management Note (Signed)
    Page 1 of 1   10/09/2013     4:30:48 PM   CARE MANAGEMENT NOTE 10/09/2013  Patient:  [name]   Account Number:  [id number]  Date Initiated:  10/09/2013  Documentation initiated by:  Zaelyn Noack  Subjective/Objective Assessment:   PT ADM WITH CP, HTN ON 2/7.  PTA, PT INDEPENDENT; SHE HAS HEMODIALYSIS ON MWF.     Action/Plan:   PT'S SATS 85% ON RA AT REST.  WILL NEED HOME OXYGEN.  ORDER WRITTEN, AND REFERAL TO [facility] FOR HOME O2 SET UP.  PORTABLE O2 TANK DELIVERED TO ROOM PRIOR TO DC.   Anticipated DC Date:  10/09/2013   Anticipated DC Plan:  HOME/SELF CARE      DC Planning Services  CM consult      Choice offered to / List presented to:     DME arranged  [facility]      DME agency  [facility]        Status of service:  Completed, signed off Medicare Important Message given?   (If response is "NO", the following Medicare IM given date fields will be blank) Date Medicare IM given:   Date Additional Medicare IM given:    Discharge Disposition:  HOME/SELF CARE  Per UR Regulation:  Reviewed for med. necessity/level of care/duration of stay  If discussed at Long Length of Stay Meetings, dates discussed:    Comments:

## 2013-10-09 NOTE — Progress Notes (Signed)
Utilization review completed. Donzell Coller, RN, BSN. 

## 2013-10-09 NOTE — Progress Notes (Signed)
  [facility] Progress Note   Subjective: No complaints , 4kg off with HD yest  Filed Vitals:   10/08/13 1810 10/08/13 1901 10/08/13 2009 10/09/13 0500  BP: 148/59 149/49  126/47  Pulse: 105   76  Temp: 98.6 F (37 C)  101.1 F (38.4 C) 99.1 F (37.3 C)  TempSrc: Oral  Oral Oral  Resp: 17   18  Height:      Weight: 52.7 kg (116 lb 2.9 oz)   52.2 kg (115 lb 1.3 oz)  SpO2: 100% 98%  92%  Exam Alert, no distress lying flat No jvd Chest bibasilar fine rales, mostly clear RRR harsh SEM rusb, no rub or gallop Abd soft, nt/nd, no ascites Mild LE edema bilat pretibial LUA fistula patent Neuro is nf, ox3  Dialysis: MWF [place] 3.5h  52kg   3K/2.25 [place]  Heparin none  LUA AVF    Hect 2   EPO 1000   Venofer 100 mg/wk  Assessment: 1 NSTEMI, per cath yest multivessel disease, 2/3 grafts occluded, stable anatomy from Nov cath- medical Rx per cardiology 2 ESRD, vol down w hd yest, at dry wt now 3 Afib- in sinus rhythm, not AC candidate per cardiology, amio started 4 2HPTH, vit D, binders 5 Anemia, Hb 9's on esa, IV Fe 6 Aortic stenosis, severe- medical Rx 7 COPD  Plan- cont MWF HD while inpatient   Vinson Moselleob Sena Clouatre MD pager [phone]    cell [phone] 10/09/2013, 9:19 AM   Recent Labs Lab 10/06/13 0504 10/08/13 0421  NA 138 129*  K 3.8 4.6  CL 96 90*  CO2 26 18*  GLUCOSE 207* 107*  BUN 11 41*  CREATININE 2.98* 5.64*  CALCIUM 8.9 9.0    Recent Labs Lab 10/06/13 0504  AST 45*  ALT 21  ALKPHOS 154*  BILITOT <0.2*  PROT 7.0  ALBUMIN 3.4*    Recent Labs Lab 10/06/13 0504 10/07/13 0330  10/07/13 1958 10/08/13 0421 10/09/13 0240  WBC 5.8 7.5  --   --  8.0 6.9  NEUTROABS 3.2  --   --   --   --   --   HGB 10.6* 10.3*  < > 9.8* 9.7* 9.8*  HCT 33.4* 32.7*  < > 30.6* 30.3* 30.2*  MCV 97.4 98.2  --   --  95.9 94.4  PLT 203 207  --   --  189 216  < > = values in this interval not displayed. Marland Kitchen. amiodarone  200 mg Oral BID  . aspirin  81 mg Oral  Daily  . atorvastatin  40 mg Oral q1800  . calcium acetate  1,334 mg Oral TID WC  . clopidogrel  75 mg Oral Q breakfast  . heparin subcutaneous  5,000 Units Subcutaneous Q8H  . insulin aspart  0-5 Units Subcutaneous QHS  . insulin aspart  0-9 Units Subcutaneous TID WC  . multivitamin  1 tablet Oral QHS  . sodium chloride  3 mL Intravenous Q12H   . sodium chloride Stopped (10/09/13 0840)   acetaminophen, acetaminophen, docusate sodium, ondansetron (ZOFRAN) IV, ondansetron (ZOFRAN) IV, ondansetron

## 2013-10-18 ENCOUNTER — Ambulatory Visit: Payer: Medicare Other | Admitting: Cardiovascular Disease

## 2013-10-19 ENCOUNTER — Ambulatory Visit: Payer: Medicare Other | Admitting: Physician Assistant

## 2013-10-22 ENCOUNTER — Emergency Department (HOSPITAL_COMMUNITY): Payer: Medicare Other

## 2013-10-22 ENCOUNTER — Inpatient Hospital Stay (HOSPITAL_COMMUNITY)
Admission: EM | Admit: 2013-10-22 | Discharge: 2013-10-27 | DRG: 308 | Disposition: A | Discharge status: Home / Self Care | Payer: Medicare Other | Attending: Cardiovascular Disease | Admitting: Cardiovascular Disease

## 2013-10-22 ENCOUNTER — Encounter (HOSPITAL_COMMUNITY): Payer: Self-pay | Admitting: Emergency Medicine

## 2013-10-22 DIAGNOSIS — I080 Rheumatic disorders of both mitral and aortic valves: Secondary | ICD-10-CM | POA: Diagnosis present

## 2013-10-22 DIAGNOSIS — J4489 Other specified chronic obstructive pulmonary disease: Secondary | ICD-10-CM | POA: Diagnosis present

## 2013-10-22 DIAGNOSIS — I739 Peripheral vascular disease, unspecified: Secondary | ICD-10-CM | POA: Diagnosis present

## 2013-10-22 DIAGNOSIS — I498 Other specified cardiac arrhythmias: Secondary | ICD-10-CM | POA: Diagnosis present

## 2013-10-22 DIAGNOSIS — I509 Heart failure, unspecified: Secondary | ICD-10-CM | POA: Diagnosis present

## 2013-10-22 DIAGNOSIS — I5032 Chronic diastolic (congestive) heart failure: Secondary | ICD-10-CM | POA: Diagnosis present

## 2013-10-22 DIAGNOSIS — Z992 Dependence on renal dialysis: Secondary | ICD-10-CM

## 2013-10-22 DIAGNOSIS — I252 Old myocardial infarction: Secondary | ICD-10-CM

## 2013-10-22 DIAGNOSIS — Z7902 Long term (current) use of antithrombotics/antiplatelets: Secondary | ICD-10-CM

## 2013-10-22 DIAGNOSIS — I25810 Atherosclerosis of coronary artery bypass graft(s) without angina pectoris: Secondary | ICD-10-CM | POA: Diagnosis present

## 2013-10-22 DIAGNOSIS — Z888 Allergy status to other drugs, medicaments and biological substances status: Secondary | ICD-10-CM

## 2013-10-22 DIAGNOSIS — Z87891 Personal history of nicotine dependence: Secondary | ICD-10-CM

## 2013-10-22 DIAGNOSIS — I2582 Chronic total occlusion of coronary artery: Secondary | ICD-10-CM | POA: Diagnosis present

## 2013-10-22 DIAGNOSIS — I50810 Right heart failure, unspecified: Secondary | ICD-10-CM | POA: Diagnosis present

## 2013-10-22 DIAGNOSIS — Z882 Allergy status to sulfonamides status: Secondary | ICD-10-CM

## 2013-10-22 DIAGNOSIS — I2510 Atherosclerotic heart disease of native coronary artery without angina pectoris: Secondary | ICD-10-CM | POA: Diagnosis present

## 2013-10-22 DIAGNOSIS — I480 Paroxysmal atrial fibrillation: Secondary | ICD-10-CM | POA: Diagnosis present

## 2013-10-22 DIAGNOSIS — I50812 Chronic right heart failure: Secondary | ICD-10-CM

## 2013-10-22 DIAGNOSIS — I4892 Unspecified atrial flutter: Secondary | ICD-10-CM | POA: Diagnosis not present

## 2013-10-22 DIAGNOSIS — E785 Hyperlipidemia, unspecified: Secondary | ICD-10-CM | POA: Diagnosis present

## 2013-10-22 DIAGNOSIS — Z794 Long term (current) use of insulin: Secondary | ICD-10-CM

## 2013-10-22 DIAGNOSIS — E1129 Type 2 diabetes mellitus with other diabetic kidney complication: Secondary | ICD-10-CM | POA: Diagnosis present

## 2013-10-22 DIAGNOSIS — I359 Nonrheumatic aortic valve disorder, unspecified: Secondary | ICD-10-CM

## 2013-10-22 DIAGNOSIS — N2581 Secondary hyperparathyroidism of renal origin: Secondary | ICD-10-CM | POA: Diagnosis present

## 2013-10-22 DIAGNOSIS — I959 Hypotension, unspecified: Secondary | ICD-10-CM

## 2013-10-22 DIAGNOSIS — Z7982 Long term (current) use of aspirin: Secondary | ICD-10-CM

## 2013-10-22 DIAGNOSIS — I471 Supraventricular tachycardia: Secondary | ICD-10-CM

## 2013-10-22 DIAGNOSIS — R079 Chest pain, unspecified: Secondary | ICD-10-CM | POA: Diagnosis present

## 2013-10-22 DIAGNOSIS — I4949 Other premature depolarization: Secondary | ICD-10-CM | POA: Diagnosis present

## 2013-10-22 DIAGNOSIS — J449 Chronic obstructive pulmonary disease, unspecified: Secondary | ICD-10-CM | POA: Diagnosis present

## 2013-10-22 DIAGNOSIS — N186 End stage renal disease: Secondary | ICD-10-CM | POA: Diagnosis present

## 2013-10-22 DIAGNOSIS — Z79899 Other long term (current) drug therapy: Secondary | ICD-10-CM

## 2013-10-22 DIAGNOSIS — F17200 Nicotine dependence, unspecified, uncomplicated: Secondary | ICD-10-CM

## 2013-10-22 DIAGNOSIS — I120 Hypertensive chronic kidney disease with stage 5 chronic kidney disease or end stage renal disease: Secondary | ICD-10-CM | POA: Diagnosis present

## 2013-10-22 DIAGNOSIS — E119 Type 2 diabetes mellitus without complications: Secondary | ICD-10-CM | POA: Diagnosis present

## 2013-10-22 DIAGNOSIS — I4719 Other supraventricular tachycardia: Secondary | ICD-10-CM

## 2013-10-22 DIAGNOSIS — N039 Chronic nephritic syndrome with unspecified morphologic changes: Secondary | ICD-10-CM

## 2013-10-22 DIAGNOSIS — I2489 Other forms of acute ischemic heart disease: Secondary | ICD-10-CM | POA: Diagnosis present

## 2013-10-22 DIAGNOSIS — I452 Bifascicular block: Secondary | ICD-10-CM | POA: Diagnosis present

## 2013-10-22 DIAGNOSIS — Z8249 Family history of ischemic heart disease and other diseases of the circulatory system: Secondary | ICD-10-CM

## 2013-10-22 DIAGNOSIS — I2789 Other specified pulmonary heart diseases: Secondary | ICD-10-CM | POA: Diagnosis present

## 2013-10-22 DIAGNOSIS — D631 Anemia in chronic kidney disease: Secondary | ICD-10-CM | POA: Diagnosis present

## 2013-10-22 DIAGNOSIS — I4891 Unspecified atrial fibrillation: Principal | ICD-10-CM | POA: Diagnosis present

## 2013-10-22 DIAGNOSIS — I10 Essential (primary) hypertension: Secondary | ICD-10-CM | POA: Diagnosis present

## 2013-10-22 DIAGNOSIS — J90 Pleural effusion, not elsewhere classified: Secondary | ICD-10-CM | POA: Diagnosis not present

## 2013-10-22 DIAGNOSIS — E8779 Other fluid overload: Secondary | ICD-10-CM | POA: Diagnosis not present

## 2013-10-22 DIAGNOSIS — I350 Nonrheumatic aortic (valve) stenosis: Secondary | ICD-10-CM | POA: Diagnosis present

## 2013-10-22 DIAGNOSIS — I248 Other forms of acute ischemic heart disease: Secondary | ICD-10-CM | POA: Diagnosis present

## 2013-10-22 HISTORY — DX: Paroxysmal atrial fibrillation: I48.0

## 2013-10-22 HISTORY — DX: Dependence on renal dialysis: Z99.2

## 2013-10-22 HISTORY — DX: Dependence on renal dialysis: N18.6

## 2013-10-22 HISTORY — DX: Chronic right heart failure: I50.812

## 2013-10-22 LAB — COMPREHENSIVE METABOLIC PANEL
ALBUMIN: 2.6 g/dL — AB (ref 3.5–5.2)
ALK PHOS: 105 U/L (ref 39–117)
ALT: 13 U/L (ref 0–35)
AST: 17 U/L (ref 0–37)
BUN: 11 mg/dL (ref 6–23)
CO2: 25 mEq/L (ref 19–32)
Calcium: 7.5 mg/dL — ABNORMAL LOW (ref 8.4–10.5)
Chloride: 96 mEq/L (ref 96–112)
Creatinine, Ser: 2.43 mg/dL — ABNORMAL HIGH (ref 0.50–1.10)
GFR calc Af Amer: 22 mL/min — ABNORMAL LOW (ref >90)
GFR calc non Af Amer: 19 mL/min — ABNORMAL LOW (ref >90)
Glucose, Bld: 382 mg/dL — ABNORMAL HIGH (ref 70–99)
POTASSIUM: 3.3 meq/L — AB (ref 3.7–5.3)
Sodium: 139 mEq/L (ref 137–147)
Total Protein: 5.9 g/dL — ABNORMAL LOW (ref 6.0–8.3)

## 2013-10-22 LAB — CBC WITH DIFFERENTIAL/PLATELET
BASOS ABS: 0 10*3/uL (ref 0.0–0.1)
BASOS PCT: 1 % (ref 0–1)
Eosinophils Absolute: 0.5 10*3/uL (ref 0.0–0.7)
Eosinophils Relative: 7 % — ABNORMAL HIGH (ref 0–5)
HCT: 29.8 % — ABNORMAL LOW (ref 36.0–46.0)
HEMOGLOBIN: 9.6 g/dL — AB (ref 12.0–15.0)
Lymphocytes Relative: 22 % (ref 12–46)
Lymphs Abs: 1.5 10*3/uL (ref 0.7–4.0)
MCH: 30.4 pg (ref 26.0–34.0)
MCHC: 32.2 g/dL (ref 30.0–36.0)
MCV: 94.3 fL (ref 78.0–100.0)
MONOS PCT: 10 % (ref 3–12)
Monocytes Absolute: 0.7 10*3/uL (ref 0.1–1.0)
NEUTROS ABS: 4.2 10*3/uL (ref 1.7–7.7)
NEUTROS PCT: 61 % (ref 43–77)
PLATELETS: 227 10*3/uL (ref 150–400)
RBC: 3.16 MIL/uL — ABNORMAL LOW (ref 3.87–5.11)
RDW: 15.6 % — AB (ref 11.5–15.5)
WBC: 7 10*3/uL (ref 4.0–10.5)

## 2013-10-22 LAB — PRO B NATRIURETIC PEPTIDE: Pro B Natriuretic peptide (BNP): >70000 pg/mL — ABNORMAL HIGH (ref 0–125)

## 2013-10-22 LAB — TROPONIN I: Troponin I: <0.3 ng/mL (ref <0.30)

## 2013-10-22 MED ORDER — RENA-VITE PO TABS
1.0000 | ORAL_TABLET | Freq: Every day | ORAL | Status: DC
  Administered 2013-10-23 – 2013-10-25 (×4): 1 via ORAL
  Administered 2013-10-26: 22:00:00 via ORAL
  Filled 2013-10-22 (×6): qty 1

## 2013-10-22 MED ORDER — ASPIRIN 81 MG PO CHEW
81.0000 mg | CHEWABLE_TABLET | Freq: Every day | ORAL | Status: DC
  Administered 2013-10-23 – 2013-10-27 (×5): 81 mg via ORAL
  Filled 2013-10-22 (×5): qty 1

## 2013-10-22 MED ORDER — AMIODARONE HCL IN DEXTROSE 360-4.14 MG/200ML-% IV SOLN
60.0000 mg/h | Freq: Once | INTRAVENOUS | Status: AC
  Administered 2013-10-22: 60 mg/h via INTRAVENOUS
  Filled 2013-10-22: qty 200

## 2013-10-22 MED ORDER — ATORVASTATIN CALCIUM 40 MG PO TABS
40.0000 mg | ORAL_TABLET | Freq: Every day | ORAL | Status: DC
  Administered 2013-10-23 – 2013-10-27 (×5): 40 mg via ORAL
  Filled 2013-10-22 (×6): qty 1

## 2013-10-22 MED ORDER — CLOPIDOGREL BISULFATE 75 MG PO TABS
75.0000 mg | ORAL_TABLET | Freq: Every day | ORAL | Status: DC
  Administered 2013-10-23 – 2013-10-27 (×5): 75 mg via ORAL
  Filled 2013-10-22 (×6): qty 1

## 2013-10-22 MED ORDER — AMIODARONE IV BOLUS ONLY 150 MG/100ML
150.0000 mg | Freq: Once | INTRAVENOUS | Status: AC
  Administered 2013-10-22: 150 mg via INTRAVENOUS
  Filled 2013-10-22: qty 100

## 2013-10-22 MED ORDER — NITROGLYCERIN 0.4 MG SL SUBL: 0.4000 mg | SUBLINGUAL_TABLET | SUBLINGUAL | Status: DC | PRN

## 2013-10-22 MED ORDER — AMIODARONE HCL 200 MG PO TABS
400.0000 mg | ORAL_TABLET | Freq: Every day | ORAL | Status: DC
  Administered 2013-10-23: 400 mg via ORAL
  Filled 2013-10-22: qty 2

## 2013-10-22 MED ORDER — INSULIN ASPART 100 UNIT/ML ~~LOC~~ SOLN
0.0000 [IU] | Freq: Three times a day (TID) | SUBCUTANEOUS | Status: DC
  Administered 2013-10-23: 3 [IU] via SUBCUTANEOUS
  Administered 2013-10-24: 2 [IU] via SUBCUTANEOUS
  Administered 2013-10-24 – 2013-10-25 (×2): 1 [IU] via SUBCUTANEOUS
  Administered 2013-10-25 – 2013-10-26 (×3): 2 [IU] via SUBCUTANEOUS
  Administered 2013-10-27: 3 [IU] via SUBCUTANEOUS
  Administered 2013-10-27: 2 [IU] via SUBCUTANEOUS
  Administered 2013-10-27: 1 [IU] via SUBCUTANEOUS

## 2013-10-22 NOTE — ED Notes (Signed)
Pt was given a candy bar by a friend and ate it before asking.  Educated pt on importance of asking before consuming any foods.  Pt voices understanding.

## 2013-10-22 NOTE — ED Notes (Signed)
Phelebotomy at bedside.

## 2013-10-22 NOTE — H&P (Signed)
Cardiologist:  [name] is an 73 y.o. female.   Chief Complaint:  HPI:   The patient is a 73 yo female with a history of end-stage renal disease, CAD, and aortic stenosis who suffered a non-ST segment elevation myocardial infarction in February, 2011. At that time, she underwent CABG surgery x3 by Dr. Nils Pyle and a LIMA was placed to the LAD, a vein to the circumflex, and a vein to the PDA. She did have very mild aortic stenosis at time of catheterization with a mean gradient of 10 mm. During her surgery, an intraoperative TEE confirmed mild AS and was not felt that this was severe enough to warrant replacement at that time. In August 2012 or aortic valve gradient had increased to 41 mm mean, with a peak gradient of 70. In November, she developed episodes of chest pain during dialysis as well as atrial fibrillation. She was transported to [place] from her dialysis and [place] in one 07/06/2013, cardiac catheterization revealed diffusely calcified severe disease involving the right artery with an occluded graft that supplied the PDA. The graft that supplied the obtuse marginal vessel was also occluded and the LIMA to the LAD was patent. She did have moderate aortic valve stenosis. She was turned down for reconsideration of repeat surgery. On 07/11/2013 Dr. Claiborne Billings performed complex HSRA of her severely calcified native RCA.  Tandem 3.0 x 38 mm DES stents were inserted into the native RCA.   She was admitted 2/7-2/10 with an NSTEMI.  She was in atrial tach or afib rvr at that time.  Cardiac cath on 2/9 revealed: 1. Widely patent left main coronary artery. 2. 70% mid left anterior descending artery stenosis with patent LIMA to LAD. 3. Large left circumflex artery with occluded large OM 1. SVG to OM is occluded. 4. Patent stents in the heavily calcified right coronary artery. Variable expansion of the previously placed stents due to heavy calcification. Occluded SVG to RCA. 5. Left  ventricular systolic function not assessed.  2D echo on 10/06/13 revealed EF of 60-65%, normal wall motion, diastolic dysfunction.  Moderate to Severe AS.    She presents today with CP which started while at HD.  This happens frequently while there.  She is pain free currently.  She does report some nausea at the time.  No SOB, diaphoresis, vomiting, dizziness.  The patient currently denies fever, orthopnea, PND, cough, congestion, abdominal pain, hematochezia, melena.  She was given a bolus of Amio and she converted.    Medications: Prior to Admission medications   Medication Sig Start Date End Date Taking? Authorizing Provider  amiodarone (PACERONE) 200 MG tablet Take 1 tablet (200 mg total) by mouth 2 (two) times daily. 10/09/13  Yes Allie Bossier, MD  amLODipine (NORVASC) 2.5 MG tablet Take 2.5 mg by mouth daily. 10/16/13  Yes Historical Provider, MD  aspirin 81 MG chewable tablet Chew 1 tablet (81 mg total) by mouth daily. 07/13/13  Yes Erlene Quan, PA-C  atorvastatin (LIPITOR) 40 MG tablet Take 1 tablet (40 mg total) by mouth daily at 6 PM. 07/13/13  Yes Erlene Quan, PA-C  clopidogrel (PLAVIX) 75 MG tablet Take 1 tablet (75 mg total) by mouth daily with breakfast. 07/13/13  Yes Doreene Burke Kilroy, PA-C  diltiazem (DILACOR XR) 120 MG 24 hr capsule Take 120 mg by mouth daily. 09/03/13  Yes Historical Provider, MD  insulin NPH-insulin regular (NOVOLIN 70/30) (70-30) 100 UNIT/ML injection Inject 10 Units into  the skin daily as needed (when blood sugar is low). As directed per sliding scale   Yes Historical Provider, MD  metoprolol tartrate (LOPRESSOR) 25 MG tablet Take 25 mg by mouth 2 (two) times daily. 10/16/13  Yes Historical Provider, MD  multivitamin (RENA-VIT) TABS tablet Take 1 tablet by mouth at bedtime. 07/13/13  Yes Luke K Kilroy, PA-C  nitroGLYCERIN (NITROSTAT) 0.4 MG SL tablet Place 1 tablet (0.4 mg total) under the tongue every 5 (five) minutes x 3 doses as needed for chest pain. 07/13/13   Yes Erlene Quan, PA-C     Past Medical History  Diagnosis Date  . CHF (congestive heart failure)   . Diabetes mellitus   . Chronic kidney disease   . Hyperlipidemia   . Anemia   . Hypertension   . COPD (chronic obstructive pulmonary disease)   . Peripheral vascular disease   . Coronary artery disease   . Hx of echocardiogram 03/2011    showed a mean gradient that had increased to 41 and a peak gradient that had increase to 70, she has moderate tricuspid regurigitation, mild to moderate mitral regurigitation, and significant LA dilation.    Past Surgical History  Procedure Laterality Date  . Coronary artery bypass graft  09/2009    x3 by Dr Lawson Fiscal with LIMA to the LAD, a vein to the circumflex and a vien to the PDA.  Marland Kitchen Abdominal hysterectomy    . Thoracentesis  11-2009  . Av fistula placement  01-12-2008    left upper arm  . Cardiac catheterization  10/15/2009    which showed low normal LV function with mild inferior hypocontractility.    Family History  Problem Relation Age of Onset  . Heart disease Mother    Social History:  reports that she has quit smoking. Her smoking use included Cigarettes. She started smoking about 40 years ago. She smoked 0.50 packs per day. She has never used smokeless tobacco. She reports that she does not drink alcohol or use illicit drugs.  Allergies:  Allergies  Allergen Reactions  . Codeine Rash  . Sulfa Antibiotics Other (See Comments)    unknown     (Not in a hospital admission)  Results for orders placed during the hospital encounter of 10/22/13 (from the past 48 hour(s))  CBC WITH DIFFERENTIAL     Status: Abnormal   Collection Time    10/22/13  2:39 PM      Result Value Ref Range   WBC 7.0  4.0 - 10.5 K/uL   RBC 3.16 (*) 3.87 - 5.11 MIL/uL   Hemoglobin 9.6 (*) 12.0 - 15.0 g/dL   Comment: REPEATED TO VERIFY   HCT 29.8 (*) 36.0 - 46.0 %   MCV 94.3  78.0 - 100.0 fL   MCH 30.4  26.0 - 34.0 pg   MCHC 32.2  30.0 - 36.0 g/dL    RDW 15.6 (*) 11.5 - 15.5 %   Platelets 227  150 - 400 K/uL   Neutrophils Relative % 61  43 - 77 %   Neutro Abs 4.2  1.7 - 7.7 K/uL   Lymphocytes Relative 22  12 - 46 %   Lymphs Abs 1.5  0.7 - 4.0 K/uL   Monocytes Relative 10  3 - 12 %   Monocytes Absolute 0.7  0.1 - 1.0 K/uL   Eosinophils Relative 7 (*) 0 - 5 %   Eosinophils Absolute 0.5  0.0 - 0.7 K/uL   Basophils Relative 1  0 -  1 %   Basophils Absolute 0.0  0.0 - 0.1 K/uL  COMPREHENSIVE METABOLIC PANEL     Status: Abnormal   Collection Time    10/22/13  2:39 PM      Result Value Ref Range   Sodium 139  137 - 147 mEq/L   Potassium 3.3 (*) 3.7 - 5.3 mEq/L   Chloride 96  96 - 112 mEq/L   CO2 25  19 - 32 mEq/L   Glucose, Bld 382 (*) 70 - 99 mg/dL   BUN 11  6 - 23 mg/dL   Creatinine, Ser 2.43 (*) 0.50 - 1.10 mg/dL   Calcium 7.5 (*) 8.4 - 10.5 mg/dL   Total Protein 5.9 (*) 6.0 - 8.3 g/dL   Albumin 2.6 (*) 3.5 - 5.2 g/dL   AST 17  0 - 37 U/L   ALT 13  0 - 35 U/L   Alkaline Phosphatase 105  39 - 117 U/L   Total Bilirubin <0.2 (*) 0.3 - 1.2 mg/dL   GFR calc non Af Amer 19 (*) >90 mL/min   GFR calc Af Amer 22 (*) >90 mL/min   Comment: (NOTE)     The eGFR has been calculated using the CKD EPI equation.     This calculation has not been validated in all clinical situations.     eGFR's persistently <90 mL/min signify possible Chronic Kidney     Disease.  TROPONIN I     Status: None   Collection Time    10/22/13  2:39 PM      Result Value Ref Range   Troponin I <0.30  <0.30 ng/mL   Comment:            Due to the release kinetics of cTnI,     a negative result within the first hours     of the onset of symptoms does not rule out     myocardial infarction with certainty.     If myocardial infarction is still suspected,     repeat the test at appropriate intervals.  PRO B NATRIURETIC PEPTIDE     Status: Abnormal   Collection Time    10/22/13  2:39 PM      Result Value Ref Range   Pro B Natriuretic peptide (BNP) >70000.0 (*)  0 - 125 pg/mL   Dg Chest Port 1 View  10/22/2013   CLINICAL DATA:  Chest pain  EXAM: PORTABLE CHEST - 1 VIEW  COMPARISON:  10/06/2013  FINDINGS: Cardiomegaly again noted. Status post CABG. Stable right pleural effusion with right basilar atelectasis or infiltrate. No pulmonary edema.  IMPRESSION: Cardiomegaly. Status post CABG. Stable right pleural effusion with right basilar atelectasis or infiltrate.   Electronically Signed   By: Lahoma Crocker M.D.   On: 10/22/2013 13:05    Review of Systems  Constitutional: Negative for fever and diaphoresis.  HENT: Negative for congestion and sore throat.   Respiratory: Negative for cough and shortness of breath.   Cardiovascular: Positive for chest pain (Resolved) and leg swelling. Negative for orthopnea.  Gastrointestinal: Positive for nausea. Negative for vomiting, blood in stool and melena.  Genitourinary: Negative for hematuria.       She reports minimal UOP, but does have some.  Musculoskeletal: Negative for neck pain.  Neurological: Negative for dizziness.  All other systems reviewed and are negative.    Blood pressure 127/51, pulse 64, temperature 97.7 F (36.5 C), temperature source Oral, resp. rate 19, SpO2 100.00%. Physical Exam  Nursing note  and vitals reviewed. Constitutional: She is oriented to person, place, and time. She appears well-developed and well-nourished. No distress.  HENT:  Head: Normocephalic and atraumatic.  Mouth/Throat: Oropharynx is clear and moist. No oropharyngeal exudate.  Eyes: EOM are normal. Pupils are equal, round, and reactive to light. No scleral icterus.  Neck: Normal range of motion. Neck supple.  Cardiovascular: Normal rate, regular rhythm, S1 normal and S2 normal.   Murmur heard.  Systolic murmur is present with a grade of 3/6  Pulses:      Radial pulses are 1+ on the right side, and 1+ on the left side.       Dorsalis pedis pulses are 1+ on the right side, and 1+ on the left side.  Respiratory: She has  no wheezes. She has no rales.  Decreased BS bilaterally.  Bilateral rales.  No wheezing  GI: Soft. Bowel sounds are normal. She exhibits no distension. There is no tenderness.  Musculoskeletal:  1+ LEE  Lymphadenopathy:    She has no cervical adenopathy.  Neurological: She is alert and oriented to person, place, and time. She exhibits normal muscle tone.  Skin: Skin is warm and dry.  Psychiatric: She has a normal mood and affect.     Assessment/Plan Principal Problem:   Chest pain Active Problems:   Aortic stenosis, moderate at cath 07/11/13   PAF (paroxysmal atrial fibrillation)   End stage renal disease- HD MWF in [place]   DM (diabetes mellitus)   HTN (hypertension)   HLD (hyperlipidemia)   COPD (chronic obstructive pulmonary disease)   CAD- CABG X 3 09/2009, HSRA/DES to native RCA 07/11/13  Plan:  Admitting with CP likely from demand ischemia secondary to mod to sev AS and rapid Afib.  I would not be surprised if her troponin becomes positive.   No plan for cath.  Will admit to telemetry.   Increase dose of PO amio to 474m daily.  Monitor on tele.  HTarri Fuller2/23/2015, 5:59 PM  I have personally seen and examined this patient with BTarri Fuller PA-C. I agree with the assessment and plan as outlined above. She is known to have severe AS by echo February 2015 with mean gradient 43 mmHg. Now with atrial fib with RVR causing chest pain, likely due to demand with known underlying CAD and severe AS. She is now in sinus and chest pain free. Continue amiodarone and home meds. Monitor on telemetry. She has been deemed not to be a redo surgical candidate by Dr. VPrescott Gumin November 2014. She has undergone complex coronary angioplasty since then. Now with severe AS. She will not be a candidate for traditional AVR. We can consider TAVR as an option in the future but she would be a high risk candidate even for TAVR given her ESRD on HD, severe COPD, porcelain aorta, poor functional status.    [name] 10/22/2013 7:54 PM

## 2013-10-22 NOTE — ED Notes (Signed)
Renal diet ordered 

## 2013-10-22 NOTE — ED Notes (Signed)
IV team at bedside 

## 2013-10-22 NOTE — ED Notes (Signed)
IV team will pull dialysis needles from graft

## 2013-10-22 NOTE — ED Notes (Signed)
Spoke with [name] in the main lab and she states she did receive the redrawn samples.

## 2013-10-22 NOTE — ED Notes (Signed)
Bedside report given to [place] Hill[place], [place]

## 2013-10-22 NOTE — ED Notes (Signed)
Lab called by Diplomatic Services operational officersecretary spoke with [name]. States that blood was never received. Phlebotomy at bedside for redraw.

## 2013-10-22 NOTE — ED Provider Notes (Addendum)
CSN: [id number]     Arrival date & time 10/22/13  1051 History   First MD Initiated Contact with Patient 10/22/13 1113     Chief Complaint  Patient presents with  . Chest Pain     (Consider location/radiation/quality/duration/timing/severity/associated sxs/prior Treatment) Patient is a 73 y.o. female presenting with chest pain. The history is provided by the patient (the pt states she was at dialysis and became having chest pain).  Chest Pain Pain location:  Substernal area Pain quality: aching   Pain radiates to:  Does not radiate Pain radiates to the back: no   Pain severity:  Moderate Onset quality:  Sudden Timing:  Constant Progression:  Waxing and waning Chronicity:  Recurrent Context: not breathing   Associated symptoms: palpitations   Associated symptoms: no abdominal pain, no back pain, no cough, no fatigue and no headache     Past Medical History  Diagnosis Date  . CHF (congestive heart failure)   . Diabetes mellitus   . Chronic kidney disease   . Hyperlipidemia   . Anemia   . Hypertension   . COPD (chronic obstructive pulmonary disease)   . Peripheral vascular disease   . Coronary artery disease   . Hx of echocardiogram 03/2011    showed a mean gradient that had increased to 41 and a peak gradient that had increase to 70, she has moderate tricuspid regurigitation, mild to moderate mitral regurigitation, and significant LA dilation.   Past Surgical History  Procedure Laterality Date  . Coronary artery bypass graft  09/2009    x3 by Dr Alla German with LIMA to the LAD, a vein to the circumflex and a vien to the PDA.  Marland Kitchen Abdominal hysterectomy    . Thoracentesis  11-2009  . Av fistula placement  01-12-2008    left upper arm  . Cardiac catheterization  10/15/2009    which showed low normal LV function with mild inferior hypocontractility.   Family History  Problem Relation Age of Onset  . Heart disease Mother    History  Substance Use Topics  . Smoking  status: Former Smoker -- 0.50 packs/day    Types: Cigarettes    Start date: 06/19/1973  . Smokeless tobacco: Never Used  . Alcohol Use: No   OB History   Grav Para Term Preterm Abortions TAB SAB Ect Mult Living                 Review of Systems  Constitutional: Negative for appetite change and fatigue.  HENT: Negative for congestion, ear discharge and sinus pressure.   Eyes: Negative for discharge.  Respiratory: Negative for cough.   Cardiovascular: Positive for chest pain and palpitations.  Gastrointestinal: Negative for abdominal pain and diarrhea.  Genitourinary: Negative for frequency and hematuria.  Musculoskeletal: Negative for back pain.  Skin: Negative for rash.  Neurological: Negative for seizures and headaches.  Psychiatric/Behavioral: Negative for hallucinations.      Allergies  Codeine and Sulfa antibiotics  Home Medications   Current Outpatient Rx  Name  Route  Sig  Dispense  Refill  . amiodarone (PACERONE) 200 MG tablet   Oral   Take 1 tablet (200 mg total) by mouth 2 (two) times daily.   60 tablet   0   . aspirin 81 MG chewable tablet   Oral   Chew 1 tablet (81 mg total) by mouth daily.         Marland Kitchen atorvastatin (LIPITOR) 40 MG tablet   Oral   Take 1  tablet (40 mg total) by mouth daily at 6 PM.   90 tablet   3   . clopidogrel (PLAVIX) 75 MG tablet   Oral   Take 1 tablet (75 mg total) by mouth daily with breakfast.   90 tablet   3   . insulin NPH-insulin regular (NOVOLIN 70/30) (70-30) 100 UNIT/ML injection   Subcutaneous   Inject 10 Units into the skin daily as needed (when blood sugar is low). As directed per sliding scale         . multivitamin (RENA-VIT) TABS tablet   Oral   Take 1 tablet by mouth at bedtime.   90 tablet   3   . nitroGLYCERIN (NITROSTAT) 0.4 MG SL tablet   Sublingual   Place 1 tablet (0.4 mg total) under the tongue every 5 (five) minutes x 3 doses as needed for chest pain.   25 tablet   2    BP 93/59  Pulse  122  Temp(Src) 97.7 F (36.5 C) (Oral)  Resp 22  SpO2 99% Physical Exam  Constitutional: She is oriented to person, place, and time. She appears well-developed.  HENT:  Head: Normocephalic.  Eyes: Conjunctivae and EOM are normal. No scleral icterus.  Neck: Neck supple. No thyromegaly present.  Cardiovascular: Exam reveals no gallop and no friction rub.   No murmur heard. Rapid regular heart rate at 140  Pulmonary/Chest: No stridor. She has no wheezes. She has no rales. She exhibits no tenderness.  Abdominal: She exhibits no distension. There is no tenderness. There is no rebound.  Musculoskeletal: Normal range of motion. She exhibits no edema.  Lymphadenopathy:    She has no cervical adenopathy.  Neurological: She is oriented to person, place, and time. She exhibits normal muscle tone. Coordination normal.  Skin: No rash noted. No erythema.  Psychiatric: She has a normal mood and affect. Her behavior is normal.    ED Course  Procedures (including critical care time) Labs Review Labs Reviewed  CBC WITH DIFFERENTIAL  COMPREHENSIVE METABOLIC PANEL  TROPONIN I  PRO B NATRIURETIC PEPTIDE   Imaging Review Dg Chest Port 1 View  10/22/2013   CLINICAL DATA:  Chest pain  EXAM: PORTABLE CHEST - 1 VIEW  COMPARISON:  10/06/2013  FINDINGS: Cardiomegaly again noted. Status post CABG. Stable right pleural effusion with right basilar atelectasis or infiltrate. No pulmonary edema.  IMPRESSION: Cardiomegaly. Status post CABG. Stable right pleural effusion with right basilar atelectasis or infiltrate.   Electronically Signed   By: Natasha Mead M.D.   On: 10/22/2013 13:05    EKG Interpretation    Date/Time:  Monday October 22 2013 11:10:01 EST Ventricular Rate:  144 PR Interval:  115 QRS Duration: 149 QT Interval:  373 QTC Calculation: 577 R Axis:   158 Text Interpretation:  Sinus tachycardia Right bundle branch block ST depr, consider ischemia, anterolateral lds Confirmed by Cranston Koors  MD,  Charletta Voight ([id number]) on 10/22/2013 3:15:52 PM           CRITICAL CARE Performed by: Maurianna Benard L Total critical care time: 40 Critical care time was exclusive of separately billable procedures and treating other patients. Critical care was necessary to treat or prevent imminent or life-threatening deterioration. Critical care was time spent personally by me on the following activities: development of treatment plan with patient and/or surrogate as well as nursing, discussions with consultants, evaluation of patient's response to treatment, examination of patient, obtaining history from patient or surrogate, ordering and performing treatments and interventions, ordering and  review of laboratory studies, ordering and review of radiographic studies, pulse oximetry and re-evaluation of patient's condition.  MDM  Chest pain with tachycardia.  I spoke with cardiology and they recommended IV amiodarone Final diagnoses:  None    I spoke with cardiology and they are going to see the pt    Benny LennertJoseph L Naika Noto, MD 10/22/13 1517  Benny LennertJoseph L Dorman Calderwood, MD 10/22/13 1540

## 2013-10-22 NOTE — ED Notes (Signed)
Amiodarone gtt decreased to 30mg /hr per Dr. Aileen PilotZammitt.  Pt resting quietly at this time with no complaints voiced.

## 2013-10-22 NOTE — ED Notes (Signed)
Per EMS- Pt coming from dialysis where she was almost finished with her dialysis when she developed cp. Her Initial BP was 90/50. Her venous access to her left fistula was accessed and she was given 1800 ml from diaylsis for low bp. RBBB on EKG, HR 120, BP increased to 120/70. Pt denies any CP at this time. Given 324 aspirin. Pt is alert and oriented. No peripheral IV access established.

## 2013-10-22 NOTE — ED Notes (Signed)
MD at bedside. 

## 2013-10-22 NOTE — ED Notes (Signed)
Pt resting quietly at this time with no complaints.  Monitor NSR

## 2013-10-22 NOTE — ED Notes (Signed)
IV team notified of fistula access and need for IV access.

## 2013-10-23 DIAGNOSIS — I471 Supraventricular tachycardia: Secondary | ICD-10-CM

## 2013-10-23 DIAGNOSIS — I4719 Other supraventricular tachycardia: Secondary | ICD-10-CM

## 2013-10-23 DIAGNOSIS — I959 Hypotension, unspecified: Secondary | ICD-10-CM

## 2013-10-23 DIAGNOSIS — I498 Other specified cardiac arrhythmias: Secondary | ICD-10-CM

## 2013-10-23 LAB — GLUCOSE, CAPILLARY
GLUCOSE-CAPILLARY: 115 mg/dL — AB (ref 70–99)
GLUCOSE-CAPILLARY: 155 mg/dL — AB (ref 70–99)
Glucose-Capillary: 243 mg/dL — ABNORMAL HIGH (ref 70–99)

## 2013-10-23 LAB — TROPONIN I
Troponin I: 0.45 ng/mL (ref <0.30)
Troponin I: <0.3 ng/mL (ref <0.30)

## 2013-10-23 LAB — HEPARIN LEVEL (UNFRACTIONATED)
Heparin Unfractionated: 0.23 IU/mL — ABNORMAL LOW (ref 0.30–0.70)
Heparin Unfractionated: 0.14 IU/mL — ABNORMAL LOW (ref 0.30–0.70)

## 2013-10-23 MED ORDER — DIPHENHYDRAMINE HCL 25 MG PO CAPS
25.0000 mg | ORAL_CAPSULE | Freq: Four times a day (QID) | ORAL | Status: DC | PRN
  Administered 2013-10-23 – 2013-10-26 (×3): 25 mg via ORAL
  Filled 2013-10-23 (×2): qty 1

## 2013-10-23 MED ORDER — PENTAFLUOROPROP-TETRAFLUOROETH EX AERO: 1.0000 "application " | INHALATION_SPRAY | CUTANEOUS | Status: DC | PRN

## 2013-10-23 MED ORDER — ALTEPLASE 2 MG IJ SOLR
2.0000 mg | Freq: Once | INTRAMUSCULAR | Status: AC | PRN
  Filled 2013-10-23: qty 2

## 2013-10-23 MED ORDER — LIDOCAINE-PRILOCAINE 2.5-2.5 % EX CREA: 1.0000 "application " | TOPICAL_CREAM | CUTANEOUS | Status: DC | PRN

## 2013-10-23 MED ORDER — SODIUM CHLORIDE 0.9 % IV SOLN: 100.0000 mL | INTRAVENOUS | Status: DC | PRN

## 2013-10-23 MED ORDER — AMIODARONE LOAD VIA INFUSION
150.0000 mg | Freq: Once | INTRAVENOUS | Status: AC
  Administered 2013-10-23: 150 mg via INTRAVENOUS
  Filled 2013-10-23: qty 83.34

## 2013-10-23 MED ORDER — AMIODARONE HCL IN DEXTROSE 360-4.14 MG/200ML-% IV SOLN
30.0000 mg/h | INTRAVENOUS | Status: DC
  Administered 2013-10-24 – 2013-10-25 (×4): 30 mg/h via INTRAVENOUS
  Filled 2013-10-23 (×9): qty 200

## 2013-10-23 MED ORDER — HEPARIN (PORCINE) IN NACL 100-0.45 UNIT/ML-% IJ SOLN
1000.0000 [IU]/h | INTRAMUSCULAR | Status: DC
  Administered 2013-10-23: 650 [IU]/h via INTRAVENOUS
  Administered 2013-10-23 – 2013-10-24 (×2): 900 [IU]/h via INTRAVENOUS
  Administered 2013-10-24 – 2013-10-25 (×2): 1000 [IU]/h via INTRAVENOUS
  Filled 2013-10-23 (×5): qty 250

## 2013-10-23 MED ORDER — NEPRO/CARBSTEADY PO LIQD
237.0000 mL | ORAL | Status: DC | PRN
  Filled 2013-10-23: qty 237

## 2013-10-23 MED ORDER — HEPARIN BOLUS VIA INFUSION
3000.0000 [IU] | Freq: Once | INTRAVENOUS | Status: AC
  Administered 2013-10-23: 3000 [IU] via INTRAVENOUS
  Filled 2013-10-23: qty 3000

## 2013-10-23 MED ORDER — DIPHENHYDRAMINE HCL 25 MG PO CAPS
ORAL_CAPSULE | ORAL | Status: AC
  Filled 2013-10-23: qty 1

## 2013-10-23 MED ORDER — ALTEPLASE 2 MG IJ SOLR: 2.0000 mg | Freq: Once | INTRAMUSCULAR | Status: DC | PRN

## 2013-10-23 MED ORDER — LIDOCAINE HCL (PF) 1 % IJ SOLN: 5.0000 mL | INTRAMUSCULAR | Status: DC | PRN

## 2013-10-23 MED ORDER — ADENOSINE 12 MG/4ML IV SOLN
12.0000 mg | Freq: Once | INTRAVENOUS | Status: DC
  Filled 2013-10-23: qty 4

## 2013-10-23 MED ORDER — AMIODARONE HCL IN DEXTROSE 360-4.14 MG/200ML-% IV SOLN
60.0000 mg/h | INTRAVENOUS | Status: AC
  Administered 2013-10-23: 60 mg/h via INTRAVENOUS
  Filled 2013-10-23: qty 200

## 2013-10-23 MED ORDER — ADENOSINE 6 MG/2ML IV SOLN: 6.0000 mg | Freq: Once | INTRAVENOUS | Status: DC

## 2013-10-23 MED ORDER — HEPARIN BOLUS VIA INFUSION
1500.0000 [IU] | Freq: Once | INTRAVENOUS | Status: AC
  Administered 2013-10-23: 1500 [IU] via INTRAVENOUS
  Filled 2013-10-23: qty 1500

## 2013-10-23 MED ORDER — HEPARIN SODIUM (PORCINE) 1000 UNIT/ML DIALYSIS: 1000.0000 [IU] | INTRAMUSCULAR | Status: DC | PRN

## 2013-10-23 MED ORDER — HEPARIN SODIUM (PORCINE) 1000 UNIT/ML DIALYSIS
1000.0000 [IU] | INTRAMUSCULAR | Status: DC | PRN
  Filled 2013-10-23: qty 1

## 2013-10-23 MED ORDER — NEPRO/CARBSTEADY PO LIQD: 237.0000 mL | ORAL | Status: DC | PRN

## 2013-10-23 NOTE — Progress Notes (Signed)
UR completed. Patient changed to inpatient- requiring IV heparin gtt  

## 2013-10-23 NOTE — Progress Notes (Signed)
ANTICOAGULATION CONSULT NOTE - Follow Up Consult  Pharmacy Consult for Heparin Indication: chest pain/ACS  Allergies  Allergen Reactions  . Codeine Rash  . Sulfa Antibiotics Other (See Comments)    unknown    Patient Measurements: Height: 5\' 4"  (162.6 cm) Weight: 119 lb 11.4 oz (54.3 kg) IBW/kg (Calculated) : 54.7  Vital Signs: Temp: 98.5 F (36.9 C) (02/24 2105) Temp src: Oral (02/24 2105) BP: 109/47 mmHg (02/24 2105) Pulse Rate: 74 (02/24 2105)  Labs:  Recent Labs  10/22/13 1439 10/23/13 0008 10/23/13 0525 10/23/13 1115 10/23/13 2038  HGB 9.6*  --   --   --   --   HCT 29.8*  --   --   --   --   PLT 227  --   --   --   --   HEPARINUNFRC  --   --   --  0.23* 0.14*  CREATININE 2.43*  --   --   --   --   TROPONINI <0.30 0.45* <0.30 <0.30  --     Estimated Creatinine Clearance: 18.5 ml/min (by C-G formula based on Cr of 2.43).   Medications:  Heparin 750 units/hr  Assessment: 72yof continuing on heparin for CP/ACS. Heparin level (0.14) is subtherapeutic - will rebolus, increase rate and follow-up 8 hour heparin level. - No CBC this AM - No significant bleeding reported - No problems with line/infusion per RN  Goal of Therapy:  Heparin level 0.3-0.7 units/ml Monitor platelets by anticoagulation protocol: Yes   Plan:  1. Heparin IV bolus 1500 units x 1  2. Increase heparin drip to 900 units/hr (9 ml/hr) 3. Check heparin level 8 hours after rate increase   Cleon DewDulaney, [place] [phone] 10/23/2013,10:13 PM

## 2013-10-23 NOTE — Progress Notes (Signed)
ANTICOAGULATION CONSULT NOTE - Initial Consult  Pharmacy Consult for heparin Indication: chest pain/ACS  Allergies  Allergen Reactions  . Codeine Rash  . Sulfa Antibiotics Other (See Comments)    unknown    Patient Measurements: Height: 5\' 4"  (162.6 cm) Weight: 126 lb 9.6 oz (57.425 kg) IBW/kg (Calculated) : 54.7 Heparin Dosing Weight: 54.7 kg  Vital Signs: Temp: 98.2 F (36.8 C) (02/24 0423) Temp src: Oral (02/24 0423) BP: 147/53 mmHg (02/24 0423) Pulse Rate: 60 (02/24 0423)  Labs:  Recent Labs  10/22/13 1439 10/23/13 0008 10/23/13 0525 10/23/13 1115  HGB 9.6*  --   --   --   HCT 29.8*  --   --   --   PLT 227  --   --   --   HEPARINUNFRC  --   --   --  0.23*  CREATININE 2.43*  --   --   --   TROPONINI <0.30 0.45* <0.30 <0.30    Estimated Creatinine Clearance: 18.6 ml/min (by C-G formula based on Cr of 2.43).   Medical History: Past Medical History  Diagnosis Date  . CHF (congestive heart failure)   . Diabetes mellitus   . Chronic kidney disease   . Hyperlipidemia   . Anemia   . Hypertension   . COPD (chronic obstructive pulmonary disease)   . Peripheral vascular disease   . Coronary artery disease   . Hx of echocardiogram 03/2011    showed a mean gradient that had increased to 41 and a peak gradient that had increase to 70, she has moderate tricuspid regurigitation, mild to moderate mitral regurigitation, and significant LA dilation.    Medications:  Prescriptions prior to admission  Medication Sig Dispense Refill  . amiodarone (PACERONE) 200 MG tablet Take 1 tablet (200 mg total) by mouth 2 (two) times daily.  60 tablet  0  . amLODipine (NORVASC) 2.5 MG tablet Take 2.5 mg by mouth daily.      Marland Kitchen. aspirin 81 MG chewable tablet Chew 1 tablet (81 mg total) by mouth daily.      Marland Kitchen. atorvastatin (LIPITOR) 40 MG tablet Take 1 tablet (40 mg total) by mouth daily at 6 PM.  90 tablet  3  . clopidogrel (PLAVIX) 75 MG tablet Take 1 tablet (75 mg total) by mouth  daily with breakfast.  90 tablet  3  . diltiazem (DILACOR XR) 120 MG 24 hr capsule Take 120 mg by mouth daily.      . insulin NPH-insulin regular (NOVOLIN 70/30) (70-30) 100 UNIT/ML injection Inject 10 Units into the skin daily as needed (when blood sugar is low). As directed per sliding scale      . metoprolol tartrate (LOPRESSOR) 25 MG tablet Take 25 mg by mouth 2 (two) times daily.      . multivitamin (RENA-VIT) TABS tablet Take 1 tablet by mouth at bedtime.  90 tablet  3  . nitroGLYCERIN (NITROSTAT) 0.4 MG SL tablet Place 1 tablet (0.4 mg total) under the tongue every 5 (five) minutes x 3 doses as needed for chest pain.  25 tablet  2    Assessment: 73 year old female with ESRD and significant cardiac history.  Pt underwent CABG x3 in 2011, arteries re-occluded and pt underwent PCI with stenting in 2014. Pt presents now with (+) troponin x1, chest pain, medical management.  Pharmacy consulted to dose heparin, hgb 9.6, plts wnl.  No bleeding noted.  HL subtherapeutic.  Goal of Therapy:  Heparin level 0.3-0.7 units/ml  Monitor platelets by anticoagulation protocol: Yes   Plan: Increase heaprin gtt to 750 units/hr Daily HL and CBC Next HL 2100   Agapito Games, PharmD, BCPS Clinical Pharmacist Pager: [phone] 10/23/2013 1:05 PM

## 2013-10-23 NOTE — Progress Notes (Signed)
HD tx d/c'd early d/t HR 140's Dr. Marisue HumbleSanford notified and Cardiology notified as well. Dr Pamella PertBensinhon at bedside pt converted to NSR on her own. Pt then transferred back to 2W29. VSS no c/o per patient at this time

## 2013-10-23 NOTE — Progress Notes (Signed)
ANTICOAGULATION CONSULT NOTE - Initial Consult  Pharmacy Consult for Heparin Indication: chest pain/ACS  Allergies  Allergen Reactions  . Codeine Rash  . Sulfa Antibiotics Other (See Comments)    unknown    Patient Measurements: Weight: 119 lb 6.4 oz (54.159 kg)  Vital Signs: Temp: 98.3 F (36.8 C) (02/23 2239) Temp src: Oral (02/23 2239) BP: 125/49 mmHg (02/23 2239) Pulse Rate: 57 (02/23 2239)  Labs:  Recent Labs  10/22/13 1439 10/23/13 0008  HGB 9.6*  --   HCT 29.8*  --   PLT 227  --   CREATININE 2.43*  --   TROPONINI <0.30 0.45*    The CrCl is unknown because both a height and weight (above a minimum accepted value) are required for this calculation.   Medical History: Past Medical History  Diagnosis Date  . CHF (congestive heart failure)   . Diabetes mellitus   . Chronic kidney disease   . Hyperlipidemia   . Anemia   . Hypertension   . COPD (chronic obstructive pulmonary disease)   . Peripheral vascular disease   . Coronary artery disease   . Hx of echocardiogram 03/2011    showed a mean gradient that had increased to 41 and a peak gradient that had increase to 70, she has moderate tricuspid regurigitation, mild to moderate mitral regurigitation, and significant LA dilation.    Medications:  Prescriptions prior to admission  Medication Sig Dispense Refill  . amiodarone (PACERONE) 200 MG tablet Take 1 tablet (200 mg total) by mouth 2 (two) times daily.  60 tablet  0  . amLODipine (NORVASC) 2.5 MG tablet Take 2.5 mg by mouth daily.      Marland Kitchen. aspirin 81 MG chewable tablet Chew 1 tablet (81 mg total) by mouth daily.      Marland Kitchen. atorvastatin (LIPITOR) 40 MG tablet Take 1 tablet (40 mg total) by mouth daily at 6 PM.  90 tablet  3  . clopidogrel (PLAVIX) 75 MG tablet Take 1 tablet (75 mg total) by mouth daily with breakfast.  90 tablet  3  . diltiazem (DILACOR XR) 120 MG 24 hr capsule Take 120 mg by mouth daily.      . insulin NPH-insulin regular (NOVOLIN 70/30)  (70-30) 100 UNIT/ML injection Inject 10 Units into the skin daily as needed (when blood sugar is low). As directed per sliding scale      . metoprolol tartrate (LOPRESSOR) 25 MG tablet Take 25 mg by mouth 2 (two) times daily.      . multivitamin (RENA-VIT) TABS tablet Take 1 tablet by mouth at bedtime.  90 tablet  3  . nitroGLYCERIN (NITROSTAT) 0.4 MG SL tablet Place 1 tablet (0.4 mg total) under the tongue every 5 (five) minutes x 3 doses as needed for chest pain.  25 tablet  2    Assessment: 73 yo female with chest pain, elevated cardiac markers, for heparin  Goal of Therapy:  Heparin level 0.3-0.7 units/ml Monitor platelets by anticoagulation protocol: Yes   Plan:  Heparin 3000 units IV bolus, then 650 units/hr Check heparin level in 8 hours.  Eddie Candlebbott, Conny Situ Vernon 10/23/2013,2:07 AM

## 2013-10-23 NOTE — Progress Notes (Signed)
Patient Name: [name] Date of Encounter: 10/23/2013     Principal Problem:   Chest pain Active Problems:   End stage renal disease- HD MWF in [place]   DM (diabetes mellitus)   HTN (hypertension)   HLD (hyperlipidemia)   COPD (chronic obstructive pulmonary disease)   Aortic stenosis, moderate at cath 07/11/13   PAF (paroxysmal atrial fibrillation)   CAD- CABG X 3 09/2009, HSRA/DES to native RCA 07/11/13   Atrial fibrillation with RVR    SUBJECTIVE  No further chest pain.  No SOB   CURRENT MEDS . amiodarone  400 mg Oral Daily  . aspirin  81 mg Oral Daily  . atorvastatin  40 mg Oral q1800  . clopidogrel  75 mg Oral Q breakfast  . insulin aspart  0-9 Units Subcutaneous TID WC  . multivitamin  1 tablet Oral QHS    OBJECTIVE  Filed Vitals:   10/22/13 2239 10/22/13 2256 10/23/13 0423 10/23/13 1000  BP: 125/49  147/53   Pulse: 57  60   Temp: 98.3 F (36.8 C)  98.2 F (36.8 C)   TempSrc: Oral  Oral   Resp: 16  18   Height:    5\' 4"  (1.626 m)  Weight:  119 lb 6.4 oz (54.159 kg) 126 lb 9.6 oz (57.425 kg) 126 lb 9.6 oz (57.425 kg)  SpO2: 96%  100%     Intake/Output Summary (Last 24 hours) at 10/23/13 1200 Last data filed at 10/23/13 1002  Gross per 24 hour  Intake    440 ml  Output      0 ml  Net    440 ml   Filed Weights   10/22/13 2256 10/23/13 0423 10/23/13 1000  Weight: 119 lb 6.4 oz (54.159 kg) 126 lb 9.6 oz (57.425 kg) 126 lb 9.6 oz (57.425 kg)    PHYSICAL EXAM  General: Pleasant, NAD. Lungs:  Resp regular and unlabored, CTA. Heart: RRR no s3, systolic murmur Abdomen: Soft, non-tender, non-distended, BS + x 4.  Extremities: No edema  Accessory Clinical Findings  CBC  Recent Labs  10/22/13 1439  WBC 7.0  NEUTROABS 4.2  HGB 9.6*  HCT 29.8*  MCV 94.3  PLT 227   Basic Metabolic Panel  Recent Labs  10/22/13 1439  NA 139  K 3.3*  CL 96  CO2 25  GLUCOSE 382*  BUN 11  CREATININE 2.43*  CALCIUM 7.5*   Liver Function  Tests  Recent Labs  10/22/13 1439  AST 17  ALT 13  ALKPHOS 105  BILITOT <0.2*  PROT 5.9*  ALBUMIN 2.6*    Cardiac Enzymes  Recent Labs  10/22/13 1439 10/23/13 0008 10/23/13 0525  TROPONINI <0.30 0.45* <0.30   TELE NSR with frequent PVCs   EKG: HR 144 Sinus tachycardia Right bundle branch block Radiology/Studies   Dg Chest Port 1 View  10/22/2013   CLINICAL DATA:  Chest pain  EXAM: PORTABLE CHEST - 1 VIEW  COMPARISON:  10/06/2013  FINDINGS: Cardiomegaly again noted. Status post CABG. Stable right pleural effusion with right basilar atelectasis or infiltrate. No pulmonary edema.  IMPRESSION: Cardiomegaly. Status post CABG. Stable right pleural effusion with right basilar atelectasis or infiltrate.     ASSESSMENT AND PLAN  AS:  Medical management.    Atrial fib:  NSR.  No change in therapy. Continue oral amiodarone.    Chest pain:  The troponin was only mildly elevated secondary to atrial fib.  Medical management.   Rollene RotundaJames Weslee Prestage 10/23/2013

## 2013-10-23 NOTE — Progress Notes (Signed)
CRITICAL VALUE ALERT  Critical value received:  Troponin  Date of notification:  10/23/2013  Time of notification:  12:47am  Critical value read back:yes  Nurse who received alert:  Chesley Miresreama Davis   MD notified (1st page):  [place]   Time of first page:  12:48am   Responding MD:  Shirlee LatchMcLean  Time MD responded:  12:53am

## 2013-10-23 NOTE — Progress Notes (Signed)
  Cardiology Cross Cover Note  CTSP in hemodialysis due to tachycardia and CP  73 yo woman with known CAD, AS and ESRD. Admitted with CP while in HD. Initial ECG felt to be AF but looks more like Atach or atypical flutter. Started on amio and converted.  In HD again developed regular tachycardia with HR 140-145 associated with hypotension (SBP 70s) and CP. HD stopped.   ECG with ATach vs atypical flutter 140s.  Failed carotid massage and other vagal maneuvers.   On exam no obvious distress JVP up  Cor tach reg 2/6 AS murmur Lungs clear Ab soft Extremities warm. No significant edema  With help of Rapid Response Team I was preparing to give her adenosine and she broke spontaneously.  Will continue amiodarone load - give another bolus now and resume IV. May need EP to see.   The patient is critically ill with multiple organ systems failure and requires high complexity decision making for assessment and support, frequent evaluation and titration of therapies, application of advanced monitoring technologies and extensive interpretation of multiple databases.   Critical Care Time devoted to patient care services described in this note is 35 Minutes.  Daniel Bensimhon,MD 7:01 PM

## 2013-10-23 NOTE — Consult Note (Signed)
Renal Service Consult Note [facility]  Hadassah Paiseggy A Avallone 10/23/2013 Maree KrabbeSCHERTZ,Iyla Balzarini D Requesting Physician:  Dr Tresa EndoKelly  Reason for Consult:  ESRD pt with known CAD and episode of CP HPI: The patient is a 73 y.o. year-old with hx of CAD, ESRD, DM, AS and COPD.  Patient presented with CP onset late into HD session yesterday.  No associated symptoms. AFib was present and she converted after IV amio bolus. Admitted by cardiology, IV heparin. Troponins are neg, pro BNP very high, CXR with large R effusion.  No sob or cough, no orthopnea.   Pt underwent CABG in 2011.  Most recent cath 07/06/13 for CP episode showed occlusive graft disease and moderate AS; pt was not a candidate for repeat surgery and a complex PCI/rotablator/stenting procedure of calcified native RCA was done by cardiology.   ROS  no skin rash  no HA  no blurred vision  no jt pain  no confusion  Past Medical History  Past Medical History  Diagnosis Date  . CHF (congestive heart failure)   . Diabetes mellitus   . Chronic kidney disease   . Hyperlipidemia   . Anemia   . Hypertension   . COPD (chronic obstructive pulmonary disease)   . Peripheral vascular disease   . Coronary artery disease   . Hx of echocardiogram 03/2011    showed a mean gradient that had increased to 41 and a peak gradient that had increase to 70, she has moderate tricuspid regurigitation, mild to moderate mitral regurigitation, and significant LA dilation.   Past Surgical History  Past Surgical History  Procedure Laterality Date  . Coronary artery bypass graft  09/2009    x3 by Dr Alla GermanVanTright with LIMA to the LAD, a vein to the circumflex and a vien to the PDA.  Marland Kitchen. Abdominal hysterectomy    . Thoracentesis  11-2009  . Av fistula placement  01-12-2008    left upper arm  . Cardiac catheterization  10/15/2009    which showed low normal LV function with mild inferior hypocontractility.   Family History  Family History  Problem Relation  Age of Onset  . Heart disease Mother    Social History  reports that she has quit smoking. Her smoking use included Cigarettes. She started smoking about 40 years ago. She smoked 0.50 packs per day. She has never used smokeless tobacco. She reports that she does not drink alcohol or use illicit drugs. Allergies  Allergies  Allergen Reactions  . Codeine Rash  . Sulfa Antibiotics Other (See Comments)    unknown   Home medications Prior to Admission medications   Medication Sig Start Date End Date Taking? Authorizing Provider  amiodarone (PACERONE) 200 MG tablet Take 1 tablet (200 mg total) by mouth 2 (two) times daily. 10/09/13  Yes Drema Dallasurtis J Woods, MD  amLODipine (NORVASC) 2.5 MG tablet Take 2.5 mg by mouth daily. 10/16/13  Yes Historical Provider, MD  aspirin 81 MG chewable tablet Chew 1 tablet (81 mg total) by mouth daily. 07/13/13  Yes Abelino DerrickLuke K Kilroy, PA-C  atorvastatin (LIPITOR) 40 MG tablet Take 1 tablet (40 mg total) by mouth daily at 6 PM. 07/13/13  Yes Abelino DerrickLuke K Kilroy, PA-C  clopidogrel (PLAVIX) 75 MG tablet Take 1 tablet (75 mg total) by mouth daily with breakfast. 07/13/13  Yes Eda PaschalLuke K Kilroy, PA-C  diltiazem (DILACOR XR) 120 MG 24 hr capsule Take 120 mg by mouth daily. 09/03/13  Yes Historical Provider, MD  insulin NPH-insulin regular (NOVOLIN 70/30) (  70-30) 100 UNIT/ML injection Inject 10 Units into the skin daily as needed (when blood sugar is low). As directed per sliding scale   Yes Historical Provider, MD  metoprolol tartrate (LOPRESSOR) 25 MG tablet Take 25 mg by mouth 2 (two) times daily. 10/16/13  Yes Historical Provider, MD  multivitamin (RENA-VIT) TABS tablet Take 1 tablet by mouth at bedtime. 07/13/13  Yes Luke K Kilroy, PA-C  nitroGLYCERIN (NITROSTAT) 0.4 MG SL tablet Place 1 tablet (0.4 mg total) under the tongue every 5 (five) minutes x 3 doses as needed for chest pain. 07/13/13  Yes Abelino Derrick, PA-C   Liver Function Tests  Recent Labs Lab 10/22/13 1439  AST 17  ALT 13   ALKPHOS 105  BILITOT <0.2*  PROT 5.9*  ALBUMIN 2.6*   No results found for this basename: LIPASE, AMYLASE,  in the last 168 hours CBC  Recent Labs Lab 10/22/13 1439  WBC 7.0  NEUTROABS 4.2  HGB 9.6*  HCT 29.8*  MCV 94.3  PLT 227   Basic Metabolic Panel  Recent Labs Lab 10/22/13 1439  NA 139  K 3.3*  CL 96  CO2 25  GLUCOSE 382*  BUN 11  CREATININE 2.43*  CALCIUM 7.5*    Filed Vitals:   10/22/13 2239 10/22/13 2256 10/23/13 0423 10/23/13 1000  BP: 125/49  147/53   Pulse: 57  60   Temp: 98.3 F (36.8 C)  98.2 F (36.8 C)   TempSrc: Oral  Oral   Resp: 16  18   Height:    5\' 4"  (1.626 m)  Weight:  54.159 kg (119 lb 6.4 oz) 57.425 kg (126 lb 9.6 oz) 57.425 kg (126 lb 9.6 oz)  SpO2: 96%  100%    Exam Alert, no distress, periorbital edema No rash, cyanosis or gangrene Sclera anicteric, throat clear +JVD Rales L base, rales R lung 2/3 up RRR harsh 3/6 SEM over precordium, no rub or gallop Abd soft, nt, nd, no ascites Bilat mild pitting ankle edema Neuro is nf, ox3 LUA AV fistula patent  CXR large R effusion  Dialysis: MWF [facility] 3.5h  F160  52kg  3K/2.25 [place]   L AVF Prof 4   Heparin none Hectorol 2     Epo 2200   Assessment: 1 Chest pain / hx CABG- trop's neg, vol overloaded 2 ESRD on HD 3 Volume excess- large pleural effusion, leg edema, 5kg up 4 AS, moderate 5 DM 6 COPD 7 HTN- on low dose norvasc, BB and po dilt 8 Anemia- Hb 9.6  Plan- HD today and tomorrow, discussed fluid management with patient; will need daily HD to get volume down   Vinson Moselle MD (pgr) [phone]    (c) [phone] 10/23/2013, 11:54 AM

## 2013-10-23 NOTE — Progress Notes (Signed)
Inpatient Diabetes Program Recommendations  AACE/ADA: New Consensus Statement on Inpatient Glycemic Control (2013)  Target Ranges:  Prepandial:   less than 140 mg/dL      Peak postprandial:   less than 180 mg/dL (1-2 hours)      Critically ill patients:  140 - 180 mg/dL  Results for [name], [name] (MRN [id number]) as of 10/23/2013 11:17  Ref. Range 10/23/2013 05:55  Glucose-Capillary Latest Range: 70-99 mg/dL 409243 (H)   Inpatient Diabetes Program Recommendations Insulin - Basal: consider adding low dose basal Levemir 5 units Thank you  [name] BSN, RN,CDE Inpatient Diabetes Coordinator [phone] (team pager)

## 2013-10-24 ENCOUNTER — Inpatient Hospital Stay (HOSPITAL_COMMUNITY): Payer: Medicare Other

## 2013-10-24 LAB — RENAL FUNCTION PANEL
Albumin: 3 g/dL — ABNORMAL LOW (ref 3.5–5.2)
BUN: 21 mg/dL (ref 6–23)
CHLORIDE: 90 meq/L — AB (ref 96–112)
CO2: 23 mEq/L (ref 19–32)
Calcium: 9 mg/dL (ref 8.4–10.5)
Creatinine, Ser: 3.75 mg/dL — ABNORMAL HIGH (ref 0.50–1.10)
GFR calc Af Amer: 13 mL/min — ABNORMAL LOW (ref >90)
GFR, EST NON AFRICAN AMERICAN: 11 mL/min — AB (ref >90)
Glucose, Bld: 164 mg/dL — ABNORMAL HIGH (ref 70–99)
PHOSPHORUS: 4.8 mg/dL — AB (ref 2.3–4.6)
POTASSIUM: 4.8 meq/L (ref 3.7–5.3)
Sodium: 132 mEq/L — ABNORMAL LOW (ref 137–147)

## 2013-10-24 LAB — GLUCOSE, CAPILLARY
GLUCOSE-CAPILLARY: 133 mg/dL — AB (ref 70–99)
Glucose-Capillary: 186 mg/dL — ABNORMAL HIGH (ref 70–99)
Glucose-Capillary: 75 mg/dL (ref 70–99)
Glucose-Capillary: 174 mg/dL — ABNORMAL HIGH (ref 70–99)

## 2013-10-24 LAB — CBC
HCT: 31.5 % — ABNORMAL LOW (ref 36.0–46.0)
Hemoglobin: 10.1 g/dL — ABNORMAL LOW (ref 12.0–15.0)
MCH: 30.4 pg (ref 26.0–34.0)
MCHC: 32.1 g/dL (ref 30.0–36.0)
MCV: 94.9 fL (ref 78.0–100.0)
PLATELETS: 221 10*3/uL (ref 150–400)
RBC: 3.32 MIL/uL — ABNORMAL LOW (ref 3.87–5.11)
RDW: 15.9 % — ABNORMAL HIGH (ref 11.5–15.5)
WBC: 8.2 10*3/uL (ref 4.0–10.5)

## 2013-10-24 LAB — HEPARIN LEVEL (UNFRACTIONATED)
HEPARIN UNFRACTIONATED: 1.62 [IU]/mL — AB (ref 0.30–0.70)
Heparin Unfractionated: 0.31 IU/mL (ref 0.30–0.70)

## 2013-10-24 MED ORDER — METOPROLOL TARTRATE 12.5 MG HALF TABLET
12.5000 mg | ORAL_TABLET | Freq: Two times a day (BID) | ORAL | Status: DC
  Administered 2013-10-24 – 2013-10-26 (×2): 12.5 mg via ORAL
  Filled 2013-10-24 (×10): qty 1

## 2013-10-24 NOTE — Progress Notes (Signed)
Patient Name: [name] Date of Encounter: 10/24/2013  Principal Problem:   Chest pain Active Problems:   End stage renal disease- HD MWF in [place]   DM (diabetes mellitus)   HTN (hypertension)   HLD (hyperlipidemia)   COPD (chronic obstructive pulmonary disease)   Aortic stenosis, moderate at cath 07/11/13   PAF (paroxysmal atrial fibrillation)   CAD- CABG X 3 09/2009, HSRA/DES to native RCA 07/11/13   Atrial fibrillation with RVR   Atrial tachycardia   Hypotension arterial    SUBJECTIVE: Had chest pain last pm, at HD during atrial tach, none now.  OBJECTIVE Filed Vitals:   10/23/13 1805 10/23/13 1845 10/23/13 2105 10/24/13 0506  BP: 106/71 111/65 109/47 154/51  Pulse: 140 141 74 64  Temp: 97.9 F (36.6 C)  98.5 F (36.9 C) 97.7 F (36.5 C)  TempSrc: Oral  Oral Oral  Resp:   20 18  Height:      Weight: 119 lb 11.4 oz (54.3 kg)   120 lb 5.9 oz (54.6 kg)  SpO2: 100%  95% 92%    Intake/Output Summary (Last 24 hours) at 10/24/13 1028 Last data filed at 10/23/13 2300  Gross per 24 hour  Intake 138.06 ml  Output   1197 ml  Net -1058.94 ml   Filed Weights   10/23/13 1600 10/23/13 1805 10/24/13 0506  Weight: 121 lb 14.6 oz (55.3 kg) 119 lb 11.4 oz (54.3 kg) 120 lb 5.9 oz (54.6 kg)    PHYSICAL EXAM General: Well developed, well nourished, female in no acute distress. Head: Normocephalic, atraumatic.  Neck: Supple without bruits, JVD 12 cm. Lungs:  Resp regular and unlabored, rales bilateral bases. Heart: RRR, S1, S2, no S3, S4, 3/6 murmur; no rub. Abdomen: Soft, non-tender, non-distended, BS + x 4.  Extremities: No clubbing, cyanosis, no edema.  Neuro: Alert and oriented X 3. Moves all extremities spontaneously. Psych: Normal affect.  LABS: CBC: Recent Labs  10/22/13 1439 10/24/13 0235  WBC 7.0 8.2  NEUTROABS 4.2  --   HGB 9.6* 10.1*  HCT 29.8* 31.5*  MCV 94.3 94.9  PLT 227 221   Basic Metabolic Panel: Recent Labs  10/22/13 1439  NA 139   K 3.3*  CL 96  CO2 25  GLUCOSE 382*  BUN 11  CREATININE 2.43*  CALCIUM 7.5*   Liver Function Tests: Recent Labs  10/22/13 1439  AST 17  ALT 13  ALKPHOS 105  BILITOT <0.2*  PROT 5.9*  ALBUMIN 2.6*   Cardiac Enzymes: Recent Labs  10/23/13 0008 10/23/13 0525 10/23/13 1115  TROPONINI 0.45* <0.30 <0.30    BNP: Pro B Natriuretic peptide (BNP)  Date/Time Value Ref Range Status  10/22/2013  2:39 PM >70000.0* 0 - 125 pg/mL Final    TELE:  SR       Radiology/Studies: Dg Chest Port 1 View  10/22/2013   CLINICAL DATA:  Chest pain  EXAM: PORTABLE CHEST - 1 VIEW  COMPARISON:  10/06/2013  FINDINGS: Cardiomegaly again noted. Status post CABG. Stable right pleural effusion with right basilar atelectasis or infiltrate. No pulmonary edema.  IMPRESSION: Cardiomegaly. Status post CABG. Stable right pleural effusion with right basilar atelectasis or infiltrate.   Electronically Signed   By: Natasha Mead M.D.   On: 10/22/2013 13:05     Current Medications:  . adenosine (ADENOCARD) IV  12 mg Intravenous Once  . adenosine (ADENOCARD) IV  6 mg Intravenous Once  . aspirin  81 mg Oral Daily  .  atorvastatin  40 mg Oral q1800  . clopidogrel  75 mg Oral Q breakfast  . insulin aspart  0-9 Units Subcutaneous TID WC  . multivitamin  1 tablet Oral QHS   . amiodarone (NEXTERONE PREMIX) 360 mg/200 mL dextrose 30 mg/hr (10/24/13 0755)  . heparin 900 Units/hr (10/24/13 0753)    ASSESSMENT AND PLAN: 73 yo female with ESRD, CAD, AS, preserved EF, CABG 2011, LIMA-LAD only patent graft at cath 06/2013, PCI of RCA. NSTEMI 10/06/2013 insetting of atrial tach or fib during HD. Medical Rx.  Admitted 02/23 w/ chest pain during HD, in setting of rapid ?atrial tach.   Principal Problem:   Chest pain - initial troponin some elevation, otherwise normal, felt demand ischemia in the setting of tachycardia, medical therapy w/ ASA, statin. Restart metoprolol at 12.5 bid.  Active Problems:   End stage renal  disease- HD MWF in [place] - per renal team    DM (diabetes mellitus) - SSI    HTN (hypertension) - good control    HLD (hyperlipidemia) - on statin    COPD (chronic obstructive pulmonary disease) - not nebs, inhalers at home    Aortic stenosis, Mean gradient: 43mm Hg (S). Peak gradient: 81mm Hg (S) at echo 02/07 - Volume mgt w/ HD, MD advise if pt needs TAVR eval.    PAF (paroxysmal atrial fibrillation) - episodic, seems to happen at HD, on IV amio, was on 200 mg BID PTA. Continue IV Rx for now.     CAD- CABG X 3 09/2009, HSRA/DES to native RCA 07/11/13 - on     Atrial fibrillation with RVR - ECG reviewed, rate is regular, seems atrial tach. If so, MD advise on changing heparin to DVT lovenox    Atrial tachycardia - see above    Hypotension arterial - follow.  Plan - d/c when medically stable.  Signed, Theodore Demarkhonda Barrett , PA-C 10:28 AM 10/24/2013  History and all data above reviewed.  Patient examined.  I agree with the findings as above.  Events of last night noted.  No chest pain currently The patient exam reveals COR:RRR  ,  Lungs: Clear  ,  Abd: Positive bowel sounds, no rebound no guarding, Ext Oozing from dialysis fistula  .  All available labs, radiology testing, previous records reviewed. Agree with documented assessment and plan. Atrial arrhythmia:  Plan to continue IV amio for the next 24 - 48 hours.  Then switch back to PO.  Fayrene FearingJames Reanna Scoggin  10:59 AM  10/24/2013

## 2013-10-24 NOTE — Procedures (Signed)
I was present at this dialysis session, have reviewed the session itself and made  appropriate changes  Vinson Moselleob Narciso Stoutenburg MD (pgr) [phone]    [phone] 10/24/2013, 4:27 PM

## 2013-10-24 NOTE — Progress Notes (Signed)
ANTICOAGULATION CONSULT NOTE - Initial Consult  Pharmacy Consult for heparin Indication: chest pain/ACS  Allergies  Allergen Reactions  . Codeine Rash  . Sulfa Antibiotics Other (See Comments)    unknown    Patient Measurements: Height: 5\' 4"  (162.6 cm) Weight: 120 lb 5.9 oz (54.6 kg) IBW/kg (Calculated) : 54.7 Heparin Dosing Weight: 54.7 kg  Vital Signs: Temp: 97.7 F (36.5 C) (02/25 0506) Temp src: Oral (02/25 0506) BP: 154/51 mmHg (02/25 0506) Pulse Rate: 64 (02/25 0506)  Labs:  Recent Labs  10/22/13 1439 10/23/13 0008 10/23/13 0525 10/23/13 1115 10/23/13 2038 10/24/13 0235  HGB 9.6*  --   --   --   --  10.1*  HCT 29.8*  --   --   --   --  31.5*  PLT 227  --   --   --   --  221  HEPARINUNFRC  --   --   --  0.23* 0.14* 0.31  CREATININE 2.43*  --   --   --   --   --   TROPONINI <0.30 0.45* <0.30 <0.30  --   --     Estimated Creatinine Clearance: 18.6 ml/min (by C-G formula based on Cr of 2.43).   Medical History: Past Medical History  Diagnosis Date  . CHF (congestive heart failure)   . Diabetes mellitus   . Chronic kidney disease   . Hyperlipidemia   . Anemia   . Hypertension   . COPD (chronic obstructive pulmonary disease)   . Peripheral vascular disease   . Coronary artery disease   . Hx of echocardiogram 03/2011    showed a mean gradient that had increased to 41 and a peak gradient that had increase to 70, she has moderate tricuspid regurigitation, mild to moderate mitral regurigitation, and significant LA dilation.    Medications:  Prescriptions prior to admission  Medication Sig Dispense Refill  . amiodarone (PACERONE) 200 MG tablet Take 1 tablet (200 mg total) by mouth 2 (two) times daily.  60 tablet  0  . amLODipine (NORVASC) 2.5 MG tablet Take 2.5 mg by mouth daily.      Marland Kitchen aspirin 81 MG chewable tablet Chew 1 tablet (81 mg total) by mouth daily.      Marland Kitchen atorvastatin (LIPITOR) 40 MG tablet Take 1 tablet (40 mg total) by mouth daily at 6 PM.   90 tablet  3  . clopidogrel (PLAVIX) 75 MG tablet Take 1 tablet (75 mg total) by mouth daily with breakfast.  90 tablet  3  . diltiazem (DILACOR XR) 120 MG 24 hr capsule Take 120 mg by mouth daily.      . insulin NPH-insulin regular (NOVOLIN 70/30) (70-30) 100 UNIT/ML injection Inject 10 Units into the skin daily as needed (when blood sugar is low). As directed per sliding scale      . metoprolol tartrate (LOPRESSOR) 25 MG tablet Take 25 mg by mouth 2 (two) times daily.      . multivitamin (RENA-VIT) TABS tablet Take 1 tablet by mouth at bedtime.  90 tablet  3  . nitroGLYCERIN (NITROSTAT) 0.4 MG SL tablet Place 1 tablet (0.4 mg total) under the tongue every 5 (five) minutes x 3 doses as needed for chest pain.  25 tablet  2    Assessment: 73 year old female with ESRD and significant cardiac history.  Pt underwent CABG x3 in 2011, arteries re-occluded and pt underwent PCI with stenting in 2014. Pt presents now with (+) troponin x1,  chest pain, medical management.  Pt has h/o AFib but was not on anticoagulation prior to admission, she is also currently on amiodarone gtt. HL this morning is therapeutic, but on low side, level was drawn early, I anticipate the level is actually lower.  Pharmacy consulted to dose heparin, hgb 10.1 plts wnl.  No bleeding noted.  Goal of Therapy:  Heparin level 0.3-0.7 units/ml Monitor platelets by anticoagulation protocol: Yes   Plan: Increase heaprin gtt to 1000 units/hr Daily HL and CBC Next HL 2000  Agapito GamesAlison Jacalynn Buzzell, PharmD, BCPS Clinical Pharmacist Pager: [phone] 10/24/2013 11:54 AM

## 2013-10-24 NOTE — Progress Notes (Signed)
   [facility] Progress Note   Subjective: Had rapid atrial arrythmia during HD last night, 2kg off, HD stopped early. Broke to NSR spont, then IV amio started per cardiology. In nsr today, no complaints, no hx afib  Filed Vitals:   10/23/13 1805 10/23/13 1845 10/23/13 2105 10/24/13 0506  BP: 106/71 111/65 109/47 154/51  Pulse: 140 141 74 64  Temp: 97.9 F (36.6 C)  98.5 F (36.9 C) 97.7 F (36.5 C)  TempSrc: Oral  Oral Oral  Resp:   20 18  Height:      Weight: 54.3 kg (119 lb 11.4 oz)   54.6 kg (120 lb 5.9 oz)  SpO2: 100%  95% 92%  Exam  Alert, no distress, calm +JVD  Bilat basilar rales  RRR harsh 3/6 SEM over precordium, no rub or gallop  Abd soft, nt, nd, no ascites  Bilat mild pitting ankle edema  Neuro is nf, ox3  LUA AV fistula patent   CXR large R effusion   Dialysis: MWF [facility]  3.5h F160 52kg 3K/2.25 Bath L AVF Prof 4 Heparin none  Hectorol 2 Epo 2200   Assessment:  1 Chest pain / hx CABG- trop's neg 2 ESRD on HD  3 Volume excess / R pleural effusion 4 AS, moderate  5 DM  6 COPD  7 HTN- norvasc / dilt / metoprolol at home, none here so far 8 Anemia- hb 9.6, no esa for now 9 Afib / RVR- IV amio   Plan- HD again today, cont to decrease volume, high K bath, max UF 2.5. CXR after HD today   Vinson Moselleob Twyla Dais MD pager [phone]    cell [phone] 10/24/2013, 11:34 AM   Recent Labs Lab 10/22/13 1439  NA 139  K 3.3*  CL 96  CO2 25  GLUCOSE 382*  BUN 11  CREATININE 2.43*  CALCIUM 7.5*    Recent Labs Lab 10/22/13 1439  AST 17  ALT 13  ALKPHOS 105  BILITOT <0.2*  PROT 5.9*  ALBUMIN 2.6*    Recent Labs Lab 10/22/13 1439 10/24/13 0235  WBC 7.0 8.2  NEUTROABS 4.2  --   HGB 9.6* 10.1*  HCT 29.8* 31.5*  MCV 94.3 94.9  PLT 227 221   . adenosine (ADENOCARD) IV  12 mg Intravenous Once  . adenosine (ADENOCARD) IV  6 mg Intravenous Once  . aspirin  81 mg Oral Daily  . atorvastatin  40 mg Oral q1800  . clopidogrel  75 mg Oral  Q breakfast  . insulin aspart  0-9 Units Subcutaneous TID WC  . multivitamin  1 tablet Oral QHS   . amiodarone (NEXTERONE PREMIX) 360 mg/200 mL dextrose 30 mg/hr (10/24/13 0755)  . heparin 900 Units/hr (10/24/13 0753)   sodium chloride, sodium chloride, diphenhydrAMINE, feeding supplement (NEPRO CARB STEADY), heparin, lidocaine (PF), lidocaine-prilocaine, nitroGLYCERIN, pentafluoroprop-tetrafluoroeth

## 2013-10-24 NOTE — Progress Notes (Signed)
ANTICOAGULATION CONSULT NOTE - Initial Consult  Pharmacy Consult for heparin Indication: chest pain/ACS  Allergies  Allergen Reactions  . Codeine Rash  . Sulfa Antibiotics Other (See Comments)    unknown    Patient Measurements: Height: 5\' 4"  (162.6 cm) Weight:  (Per MD not to weigh pt post-dialysis.) IBW/kg (Calculated) : 54.7 Heparin Dosing Weight: 54.7 kg  Vital Signs: Temp: 98.5 F (36.9 C) (02/25 2036) Temp src: Oral (02/25 2036) BP: 123/43 mmHg (02/25 2036) Pulse Rate: 65 (02/25 2036)  Labs:  Recent Labs  10/22/13 1439 10/23/13 0008 10/23/13 0525  10/23/13 1115 10/23/13 2038 10/24/13 0235 10/24/13 1417 10/24/13 2024  HGB 9.6*  --   --   --   --   --  10.1*  --   --   HCT 29.8*  --   --   --   --   --  31.5*  --   --   PLT 227  --   --   --   --   --  221  --   --   HEPARINUNFRC  --   --   --   < > 0.23* 0.14* 0.31  --  1.62*  CREATININE 2.43*  --   --   --   --   --   --  3.75*  --   TROPONINI <0.30 0.45* <0.30  --  <0.30  --   --   --   --   < > = values in this interval not displayed.  Estimated Creatinine Clearance: 12 ml/min (by C-G formula based on Cr of 3.75).   Medical History: Past Medical History  Diagnosis Date  . CHF (congestive heart failure)   . Diabetes mellitus   . Chronic kidney disease   . Hyperlipidemia   . Anemia   . Hypertension   . COPD (chronic obstructive pulmonary disease)   . Peripheral vascular disease   . Coronary artery disease   . Hx of echocardiogram 03/2011    showed a mean gradient that had increased to 41 and a peak gradient that had increase to 70, she has moderate tricuspid regurigitation, mild to moderate mitral regurigitation, and significant LA dilation.    Medications:  Prescriptions prior to admission  Medication Sig Dispense Refill  . amiodarone (PACERONE) 200 MG tablet Take 1 tablet (200 mg total) by mouth 2 (two) times daily.  60 tablet  0  . amLODipine (NORVASC) 2.5 MG tablet Take 2.5 mg by mouth  daily.      Marland Kitchen aspirin 81 MG chewable tablet Chew 1 tablet (81 mg total) by mouth daily.      Marland Kitchen atorvastatin (LIPITOR) 40 MG tablet Take 1 tablet (40 mg total) by mouth daily at 6 PM.  90 tablet  3  . clopidogrel (PLAVIX) 75 MG tablet Take 1 tablet (75 mg total) by mouth daily with breakfast.  90 tablet  3  . diltiazem (DILACOR XR) 120 MG 24 hr capsule Take 120 mg by mouth daily.      . insulin NPH-insulin regular (NOVOLIN 70/30) (70-30) 100 UNIT/ML injection Inject 10 Units into the skin daily as needed (when blood sugar is low). As directed per sliding scale      . metoprolol tartrate (LOPRESSOR) 25 MG tablet Take 25 mg by mouth 2 (two) times daily.      . multivitamin (RENA-VIT) TABS tablet Take 1 tablet by mouth at bedtime.  90 tablet  3  . nitroGLYCERIN (NITROSTAT) 0.4 MG SL  tablet Place 1 tablet (0.4 mg total) under the tongue every 5 (five) minutes x 3 doses as needed for chest pain.  25 tablet  2    Assessment: 73 year old female with ESRD and significant cardiac history.  Pt underwent CABG x3 in 2011, arteries re-occluded and pt underwent PCI with stenting in 2014. Pt presents now with (+) troponin x1, chest pain, medical management.  Pt has h/o AFib but was not on anticoagulation prior to admission, she is also currently on amiodarone gtt. HL this morning 0.31 is therapeutic, but on low side,No bleeding noted.  Heparin drip rate increased 1000 uts/hr and recheck HL 1.62 - however pt with HD graft in L so restricted to lab draws - IV heparin in forearm and bandage in hand and elbow pt has not idea where HL drawn from - to prevent restick assume 1.62 is lab error from blood draw near IV line - recheck AML -  Should we do Foot stick for HL for accuracy/ check in AM   Goal of Therapy:  Heparin level 0.3-0.7 units/ml Monitor platelets by anticoagulation protocol: Yes   Plan: Continue Heparin drip 1000 uts/hr Daily HL, CBC  Leota SauersLisa Fremon Zacharia Pharm.D. CPP, BCPS Clinical  Pharmacist [phone] 10/24/2013 10:31 PM

## 2013-10-24 NOTE — Progress Notes (Deleted)
Patients daughter requested that CBG checks be stopped. Will notify MD. Rise PaganiniURRY, [place] Espina R

## 2013-10-24 NOTE — Procedures (Deleted)
I was present at this dialysis session, have reviewed the session itself and made  appropriate changes  Rob Glory Graefe MD (pgr) [phone]    (c) [phone] 10/24/2013, 4:20 PM   

## 2013-10-25 ENCOUNTER — Encounter (HOSPITAL_COMMUNITY): Payer: Self-pay | Admitting: Nephrology

## 2013-10-25 ENCOUNTER — Ambulatory Visit: Payer: Medicare Other | Admitting: Physician Assistant

## 2013-10-25 DIAGNOSIS — I50810 Right heart failure, unspecified: Secondary | ICD-10-CM | POA: Diagnosis present

## 2013-10-25 DIAGNOSIS — I50812 Chronic right heart failure: Secondary | ICD-10-CM

## 2013-10-25 LAB — CBC
HCT: 32.4 % — ABNORMAL LOW (ref 36.0–46.0)
Hemoglobin: 10.6 g/dL — ABNORMAL LOW (ref 12.0–15.0)
MCH: 30.7 pg (ref 26.0–34.0)
MCHC: 32.7 g/dL (ref 30.0–36.0)
MCV: 93.9 fL (ref 78.0–100.0)
Platelets: 203 10*3/uL (ref 150–400)
RBC: 3.45 MIL/uL — AB (ref 3.87–5.11)
RDW: 15.7 % — AB (ref 11.5–15.5)
WBC: 6.6 10*3/uL (ref 4.0–10.5)

## 2013-10-25 LAB — GLUCOSE, CAPILLARY
GLUCOSE-CAPILLARY: 128 mg/dL — AB (ref 70–99)
GLUCOSE-CAPILLARY: 69 mg/dL — AB (ref 70–99)
GLUCOSE-CAPILLARY: 121 mg/dL — AB (ref 70–99)
Glucose-Capillary: 177 mg/dL — ABNORMAL HIGH (ref 70–99)
Glucose-Capillary: 115 mg/dL — ABNORMAL HIGH (ref 70–99)

## 2013-10-25 LAB — HEPARIN LEVEL (UNFRACTIONATED): HEPARIN UNFRACTIONATED: 0.6 [IU]/mL (ref 0.30–0.70)

## 2013-10-25 NOTE — Progress Notes (Signed)
ANTICOAGULATION CONSULT NOTE - Initial Consult  Pharmacy Consult for heparin Indication: chest pain/ACS  Allergies  Allergen Reactions  . Codeine Rash  . Sulfa Antibiotics Other (See Comments)    unknown    Patient Measurements: Height: 5\' 4"  (162.6 cm) Weight: 119 lb 0.8 oz (54 kg) IBW/kg (Calculated) : 54.7 Heparin Dosing Weight: 54.7 kg  Vital Signs: Temp: 98.5 F (36.9 C) (02/26 0451) Temp src: Oral (02/26 0451) BP: 133/44 mmHg (02/26 0451) Pulse Rate: 54 (02/26 1017)  Labs:  Recent Labs  10/22/13 1439 10/23/13 0008 10/23/13 0525 10/23/13 1115  10/24/13 0235 10/24/13 1417 10/24/13 2024 10/25/13 0645  HGB 9.6*  --   --   --   --  10.1*  --   --  10.6*  HCT 29.8*  --   --   --   --  31.5*  --   --  32.4*  PLT 227  --   --   --   --  221  --   --  203  HEPARINUNFRC  --   --   --  0.23*  < > 0.31  --  1.62* 0.60  CREATININE 2.43*  --   --   --   --   --  3.75*  --   --   TROPONINI <0.30 0.45* <0.30 <0.30  --   --   --   --   --   < > = values in this interval not displayed.  Estimated Creatinine Clearance: 11.9 ml/min (by C-G formula based on Cr of 3.75).   Medical History: Past Medical History  Diagnosis Date  . CHF (congestive heart failure)     diast SHF, RV failure, pulm HTN  . Diabetes mellitus   . ESRD on hemodialysis     HD MWF in [place]   . Hyperlipidemia   . Anemia   . Hypertension   . COPD (chronic obstructive pulmonary disease)     not on home oxygen  . Peripheral vascular disease   . Coronary artery disease     Hx CABG. Most recent cath 07/06/13 for CP episode showed occlusive graft disease and moderate AS; pt was not a candidate for repeat surgery and a complex PCI/rotablator/stenting procedure of calcified native RCA was done by cardiology  . Hx of echocardiogram 03/2011    showed a mean gradient that had increased to 41 and a peak gradient that had increase to 70, she has moderate tricuspid regurigitation, mild to moderate mitral  regurigitation, and significant LA dilation.  Marland Kitchen PAF (paroxysmal atrial fibrillation)   . Chronic right-sided heart failure 10/25/2013    Medications:  Prescriptions prior to admission  Medication Sig Dispense Refill  . amiodarone (PACERONE) 200 MG tablet Take 1 tablet (200 mg total) by mouth 2 (two) times daily.  60 tablet  0  . amLODipine (NORVASC) 2.5 MG tablet Take 2.5 mg by mouth daily.      Marland Kitchen aspirin 81 MG chewable tablet Chew 1 tablet (81 mg total) by mouth daily.      Marland Kitchen atorvastatin (LIPITOR) 40 MG tablet Take 1 tablet (40 mg total) by mouth daily at 6 PM.  90 tablet  3  . clopidogrel (PLAVIX) 75 MG tablet Take 1 tablet (75 mg total) by mouth daily with breakfast.  90 tablet  3  . diltiazem (DILACOR XR) 120 MG 24 hr capsule Take 120 mg by mouth daily.      . insulin NPH-insulin regular (NOVOLIN 70/30) (70-30) 100 UNIT/ML  injection Inject 10 Units into the skin daily as needed (when blood sugar is low). As directed per sliding scale      . metoprolol tartrate (LOPRESSOR) 25 MG tablet Take 25 mg by mouth 2 (two) times daily.      . multivitamin (RENA-VIT) TABS tablet Take 1 tablet by mouth at bedtime.  90 tablet  3  . nitroGLYCERIN (NITROSTAT) 0.4 MG SL tablet Place 1 tablet (0.4 mg total) under the tongue every 5 (five) minutes x 3 doses as needed for chest pain.  25 tablet  2    Assessment: 73 year old female with ESRD and significant cardiac history.  Pt underwent CABG x3 in 2011, arteries re-occluded and pt underwent PCI with stenting in 2014. Pt presents now with (+) troponin x1, chest pain, medical management.  Pt has h/o AFib but was not on anticoagulation prior to admission, she is also currently on amiodarone gtt. HL this morning is therapeutic, hgb 10.6 plts wnl.  No bleeding noted.  Goal of Therapy:  Heparin level 0.3-0.7 units/ml Monitor platelets by anticoagulation protocol: Yes   Plan: Continue heparin gtt at 1000 units/hr Daily HL and CBC Next HL with am  labs  Agapito GamesAlison Reason Helzer, PharmD, BCPS Clinical Pharmacist Pager: [phone] 10/25/2013 10:40 AM

## 2013-10-25 NOTE — Progress Notes (Signed)
  [facility] Progress Note   Subjective: No complaints  Filed Vitals:   10/24/13 1730 10/24/13 1738 10/24/13 2036 10/25/13 0451  BP: 150/57 163/66 123/43 133/44  Pulse: 70 71 65 54  Temp:  98.4 F (36.9 C) 98.5 F (36.9 C) 98.5 F (36.9 C)  TempSrc:  Oral Oral Oral  Resp:  20 20 18   Height:      Weight:    54 kg (119 lb 0.8 oz)  SpO2:  100% 100% 90%  Exam  Alert, no distress, calm +JVD  Bilat basilar rales  RRR harsh 3/6 SEM over precordium, no rub or gallop  Abd soft, nt, nd, no ascites  Bilat mild pitting ankle edema  Neuro is nf, ox3  LUA AV fistula patent   CXR large R effusion no better than admit   Dialysis: MWF [place]  3.5h F160 52kg 3K/2.25 Bath L AVF Prof 4 Heparin none  Hectorol 2 Epo 2200   Assessment:  1 Chest pain / hx CABG- trop's neg; not candidate for redo surgery, had complex PCI last admit to native vessel 2 ESRD on HD  3 Pulm HTN / severe RV dysfunction / moderate aortic stenosis 4 Afib / RVR- IV amio and po lowdose MTP 5 HTN- was on norvasc, MTP and dilt at home; MTP here 6 Anemia- Hb 9.6, no esa for now 7 2HPT- Ca up, holding vit D, no binders    Plan- complex situation with RV failure, pulm HTN and AS with hypotension on HD causing recurrent/persistent volume overload.  Also afib w RVR requiring rate control agents. Will likely require more dialysis (short and long-term), considering 4 days per week.  Discussed with patient , she was not happy, but I don't see other options. If there are options to BB/diltiazem for afib rate control, please consider.  HD again today and tomorrow.    Vinson Moselleob Whitley Patchen MD pager [phone]    cell [phone] 10/25/2013, 9:17 AM   Recent Labs Lab 10/22/13 1439 10/24/13 1417  NA 139 132*  K 3.3* 4.8  CL 96 90*  CO2 25 23  GLUCOSE 382* 164*  BUN 11 21  CREATININE 2.43* 3.75*  CALCIUM 7.5* 9.0  PHOS  --  4.8*    Recent Labs Lab 10/22/13 1439 10/24/13 1417  AST 17  --   ALT 13  --    ALKPHOS 105  --   BILITOT <0.2*  --   PROT 5.9*  --   ALBUMIN 2.6* 3.0*    Recent Labs Lab 10/22/13 1439 10/24/13 0235 10/25/13 0645  WBC 7.0 8.2 6.6  NEUTROABS 4.2  --   --   HGB 9.6* 10.1* 10.6*  HCT 29.8* 31.5* 32.4*  MCV 94.3 94.9 93.9  PLT 227 221 203   . adenosine (ADENOCARD) IV  12 mg Intravenous Once  . adenosine (ADENOCARD) IV  6 mg Intravenous Once  . aspirin  81 mg Oral Daily  . atorvastatin  40 mg Oral q1800  . clopidogrel  75 mg Oral Q breakfast  . insulin aspart  0-9 Units Subcutaneous TID WC  . metoprolol tartrate  12.5 mg Oral BID  . multivitamin  1 tablet Oral QHS   . amiodarone (NEXTERONE PREMIX) 360 mg/200 mL dextrose 30 mg/hr (10/25/13 0609)  . heparin 1,000 Units/hr (10/24/13 1213)   diphenhydrAMINE, nitroGLYCERIN

## 2013-10-25 NOTE — Progress Notes (Signed)
SUBJECTIVE:  No pain.  No SOB   PHYSICAL EXAM Filed Vitals:   10/24/13 1730 10/24/13 1738 10/24/13 2036 10/25/13 0451  BP: 150/57 163/66 123/43 133/44  Pulse: 70 71 65 54  Temp:  98.4 F (36.9 C) 98.5 F (36.9 C) 98.5 F (36.9 C)  TempSrc:  Oral Oral Oral  Resp:  20 20 18   Height:      Weight:    119 lb 0.8 oz (54 kg)  SpO2:  100% 100% 90%   General:  No distress Lungs:  Clear Heart:  RRR Abdomen:  Positive bowel sounds, no rebound no guarding Extremities:  No edema   LABS:  Results for orders placed during the hospital encounter of 10/22/13 (from the past 24 hour(s))  GLUCOSE, CAPILLARY     Status: Abnormal   Collection Time    10/24/13 11:24 AM      Result Value Ref Range   Glucose-Capillary 133 (*) 70 - 99 mg/dL   Comment 1 Notify RN    RENAL FUNCTION PANEL     Status: Abnormal   Collection Time    10/24/13  2:17 PM      Result Value Ref Range   Sodium 132 (*) 137 - 147 mEq/L   Potassium 4.8  3.7 - 5.3 mEq/L   Chloride 90 (*) 96 - 112 mEq/L   CO2 23  19 - 32 mEq/L   Glucose, Bld 164 (*) 70 - 99 mg/dL   BUN 21  6 - 23 mg/dL   Creatinine, Ser 1.61 (*) 0.50 - 1.10 mg/dL   Calcium 9.0  8.4 - 09.6 mg/dL   Phosphorus 4.8 (*) 2.3 - 4.6 mg/dL   Albumin 3.0 (*) 3.5 - 5.2 g/dL   GFR calc non Af Amer 11 (*) >90 mL/min   GFR calc Af Amer 13 (*) >90 mL/min  GLUCOSE, CAPILLARY     Status: None   Collection Time    10/24/13  7:01 PM      Result Value Ref Range   Glucose-Capillary 75  70 - 99 mg/dL  HEPARIN LEVEL (UNFRACTIONATED)     Status: Abnormal   Collection Time    10/24/13  8:24 PM      Result Value Ref Range   Heparin Unfractionated 1.62 (*) 0.30 - 0.70 IU/mL  GLUCOSE, CAPILLARY     Status: Abnormal   Collection Time    10/24/13  9:39 PM      Result Value Ref Range   Glucose-Capillary 174 (*) 70 - 99 mg/dL   Comment 1 Documented in Chart     Comment 2 Notify RN    GLUCOSE, CAPILLARY     Status: Abnormal   Collection Time    10/25/13  6:11 AM   Result Value Ref Range   Glucose-Capillary 177 (*) 70 - 99 mg/dL   Comment 1 Documented in Chart     Comment 2 Notify RN    CBC     Status: Abnormal   Collection Time    10/25/13  6:45 AM      Result Value Ref Range   WBC 6.6  4.0 - 10.5 K/uL   RBC 3.45 (*) 3.87 - 5.11 MIL/uL   Hemoglobin 10.6 (*) 12.0 - 15.0 g/dL   HCT 04.5 (*) 40.9 - 81.1 %   MCV 93.9  78.0 - 100.0 fL   MCH 30.7  26.0 - 34.0 pg   MCHC 32.7  30.0 - 36.0 g/dL   RDW 91.4 (*) 78.2 -  15.5 %   Platelets 203  150 - 400 K/uL    Intake/Output Summary (Last 24 hours) at 10/25/13 0743 Last data filed at 10/24/13 1738  Gross per 24 hour  Intake    480 ml  Output   2500 ml  Net  -2020 ml    ASSESSMENT AND PLAN:  ATRIAL FIB:  NSR. Continue IV amio.  I will stop it in the AM and change to PO and we can see how she does on dialysis tomorrow.  Possibly home tomorrow.   AS:  Medical management.    CAD  No further chest pain.  Continue medical management.     Fayrene FearingJames East West Surgery Center LPochrein 10/25/2013 7:43 AM

## 2013-10-26 LAB — RENAL FUNCTION PANEL
Albumin: 2.8 g/dL — ABNORMAL LOW (ref 3.5–5.2)
BUN: 13 mg/dL (ref 6–23)
CALCIUM: 9 mg/dL (ref 8.4–10.5)
CO2: 26 meq/L (ref 19–32)
Chloride: 90 mEq/L — ABNORMAL LOW (ref 96–112)
Creatinine, Ser: 2.8 mg/dL — ABNORMAL HIGH (ref 0.50–1.10)
GFR, EST AFRICAN AMERICAN: 19 mL/min — AB (ref >90)
GFR, EST NON AFRICAN AMERICAN: 16 mL/min — AB (ref >90)
Glucose, Bld: 163 mg/dL — ABNORMAL HIGH (ref 70–99)
PHOSPHORUS: 4.1 mg/dL (ref 2.3–4.6)
POTASSIUM: 3.6 meq/L — AB (ref 3.7–5.3)
SODIUM: 132 meq/L — AB (ref 137–147)

## 2013-10-26 LAB — GLUCOSE, CAPILLARY
Glucose-Capillary: 159 mg/dL — ABNORMAL HIGH (ref 70–99)
Glucose-Capillary: 197 mg/dL — ABNORMAL HIGH (ref 70–99)
Glucose-Capillary: 92 mg/dL (ref 70–99)
Glucose-Capillary: 266 mg/dL — ABNORMAL HIGH (ref 70–99)

## 2013-10-26 LAB — CBC
HCT: 32.9 % — ABNORMAL LOW (ref 36.0–46.0)
HEMOGLOBIN: 10.7 g/dL — AB (ref 12.0–15.0)
MCH: 30.9 pg (ref 26.0–34.0)
MCHC: 32.5 g/dL (ref 30.0–36.0)
MCV: 95.1 fL (ref 78.0–100.0)
Platelets: 198 10*3/uL (ref 150–400)
RBC: 3.46 MIL/uL — ABNORMAL LOW (ref 3.87–5.11)
RDW: 15.9 % — ABNORMAL HIGH (ref 11.5–15.5)
WBC: 5.8 10*3/uL (ref 4.0–10.5)

## 2013-10-26 LAB — HEPARIN LEVEL (UNFRACTIONATED): Heparin Unfractionated: 0.71 IU/mL — ABNORMAL HIGH (ref 0.30–0.70)

## 2013-10-26 MED ORDER — AMIODARONE HCL 200 MG PO TABS
400.0000 mg | ORAL_TABLET | Freq: Two times a day (BID) | ORAL | Status: DC
  Administered 2013-10-26 (×2): 400 mg via ORAL
  Filled 2013-10-26 (×7): qty 2

## 2013-10-26 MED ORDER — HEPARIN (PORCINE) IN NACL 100-0.45 UNIT/ML-% IJ SOLN
850.0000 [IU]/h | INTRAMUSCULAR | Status: DC
  Filled 2013-10-26: qty 250

## 2013-10-26 MED ORDER — DOCUSATE SODIUM 100 MG PO CAPS
100.0000 mg | ORAL_CAPSULE | Freq: Every day | ORAL | Status: DC | PRN
  Administered 2013-10-26: 100 mg via ORAL
  Filled 2013-10-26: qty 1

## 2013-10-26 NOTE — Progress Notes (Signed)
Patient's fistula has continued bleeding, became profuse.  Required 30 minutes of pressure being held to stop the bleed.  Pressure dressing applied and will continue to assess.  Pharmacy notified that Heparin is still being held until reassessed by morning rounding physician.    Arva ChafeLester, Colon Rueth Michelle

## 2013-10-26 NOTE — Progress Notes (Addendum)
  [facility] Progress Note   Subjective: No complaints, on nasal O2. Denies SOB. 2.5kg off w stable BP yest,   Filed Vitals:   10/25/13 1628 10/25/13 2119 10/26/13 0450 10/26/13 0555  BP: 125/50 110/50 143/77   Pulse: 52 53 54   Temp: 98 F (36.7 C) 98.3 F (36.8 C) 98.1 F (36.7 C)   TempSrc: Axillary Oral Oral   Resp: 18 18 18    Height:      Weight: 54.1 kg (119 lb 4.3 oz)   55.293 kg (121 lb 14.4 oz)  SpO2: 100% 100% 97%   Exam  Alert, no distress, calm +JVD  Decreased BS R lung 1/2 up, rales L base  RRR harsh 3/6 SEM over precordium, no rub or gallop  Abd soft, nt, nd, no ascites  No LE edema  Neuro is nf, ox3  LUA AV fistula patent   CXR large R effusion, no better on 2/25 film   Dialysis: MWF [place]  3.5h F160 52kg 3K/2.25 Bath L AVF Prof 4 Heparin none  Hectorol 2 Epo 2200   Assessment:  1 Large R pleural effusion / vol overload- unable to make progress w volume, pt resistant to extra HD and staying on machine longer. Not losing wt in spite of daily HD suggesting fluid noncompliance, which pt denies.  Consider R thoracentesis, will d/w primary 2 Chest pain / hx CABG- trop's neg; not candidate for redo surgery, had complex PCI last admit to native vessel 3 Pulm HTN / severe RV dysfunction / moderate aortic stenosis 4 Afib / RVR- IV amio and po lowdose MTP 5 HTN- was on norvasc, MTP and dilt at home; MTP here 6 Anemia- Hb 9.6, no esa for now 7 2HPT- Ca up, holding vit D, no binders  ESRD on HD    Plan- HD today   Vinson Moselleob Shavelle Runkel MD pager [phone]    cell [phone] 10/26/2013, 11:20 AM   Recent Labs Lab 10/22/13 1439 10/24/13 1417  NA 139 132*  K 3.3* 4.8  CL 96 90*  CO2 25 23  GLUCOSE 382* 164*  BUN 11 21  CREATININE 2.43* 3.75*  CALCIUM 7.5* 9.0  PHOS  --  4.8*    Recent Labs Lab 10/22/13 1439 10/24/13 1417  AST 17  --   ALT 13  --   ALKPHOS 105  --   BILITOT <0.2*  --   PROT 5.9*  --   ALBUMIN 2.6* 3.0*    Recent  Labs Lab 10/22/13 1439 10/24/13 0235 10/25/13 0645 10/26/13 0450  WBC 7.0 8.2 6.6 5.8  NEUTROABS 4.2  --   --   --   HGB 9.6* 10.1* 10.6* 10.7*  HCT 29.8* 31.5* 32.4* 32.9*  MCV 94.3 94.9 93.9 95.1  PLT 227 221 203 198   . adenosine (ADENOCARD) IV  12 mg Intravenous Once  . adenosine (ADENOCARD) IV  6 mg Intravenous Once  . amiodarone  400 mg Oral BID  . aspirin  81 mg Oral Daily  . atorvastatin  40 mg Oral q1800  . clopidogrel  75 mg Oral Q breakfast  . insulin aspart  0-9 Units Subcutaneous TID WC  . metoprolol tartrate  12.5 mg Oral BID  . multivitamin  1 tablet Oral QHS     diphenhydrAMINE, nitroGLYCERIN

## 2013-10-26 NOTE — Progress Notes (Signed)
Patient bleeding non-stop from fistula this morning.  Patient states that she feels okay and that this has occurred before during this admission.  Heparin level just drawn by lab.  Vitals stable.  Bleeding under control, but still a slow bleed requiring pressure.  Notified pharmacy and they said they will wait to see Heparin level before deciding whether or not to turn off the drip for now.  They advised that if I feel the drip needs to be stopped that I can call physician.  Paged MD on-call, awaiting call-back.  Will continue to monitor.  Arva ChafeLester, Yailene Badia Michelle

## 2013-10-26 NOTE — Progress Notes (Signed)
6:57 AM.   Pt's fistula bleeding x30 minutes with pressure despite holding heparin x30" and decreasing rate. Md will reassess on rounds this am per night coverage. Instructed RN to hold heparin until that time.   Janice CoffinEarl, Taveon Enyeart Jonathan

## 2013-10-26 NOTE — Progress Notes (Signed)
MD on-call notified of bleeding and instructed to hold the Heparin until hearing back from the pharmacy.  Also instructed to call morning physician when they arrive to reassess need for Heparin.  Pharmacy then called and stated that the Heparin level came back high and instructed me to hold the Heparin for 30 minutes and then resume at a lower rate.  [name], [name]

## 2013-10-26 NOTE — Care Management Note (Unsigned)
    Page 1 of 1   10/26/2013     4:02:16 PM   CARE MANAGEMENT NOTE 10/26/2013  Patient:  [name],[name]   Account Number:  [id number]  Date Initiated:  10/26/2013  Documentation initiated by:  Analeigh Aries  Subjective/Objective Assessment:   PT ADM ON 2/23 WITH CHEST PAIN, AFIB, CHF.  PTA, PT RESIDES AT HOME ALONE.     Action/Plan:   WOULD BENEFIT FROM P.T. CONSULT TO ASSESS FOR HOME NEEDS.   Anticipated DC Date:  10/27/2013   Anticipated DC Plan:  HOME W HOME HEALTH SERVICES      DC Planning Services  CM consult      Choice offered to / List presented to:             Status of service:  In process, will continue to follow Medicare Important Message given?   (If response is "NO", the following Medicare IM given date fields will be blank) Date Medicare IM given:   Date Additional Medicare IM given:    Discharge Disposition:    Per UR Regulation:  Reviewed for med. necessity/level of care/duration of stay  If discussed at Long Length of Stay Meetings, dates discussed:    Comments:

## 2013-10-26 NOTE — Clinical Documentation Improvement (Signed)
-   Chronic Right Sided Heart Failure documented by Nephrology   - Right Ventricle is dilated, RV systolic function moderate to severely reduced, moderate to severe tricuspid regurgitation, moderate to severe AS and EF 60-65% per Echo 10/06/2013   Please document in the progress notes and discharge summary if a specific Acuity and Type of Heart Failure provides greater specificity regarding the patient's chronic right sided heart failure:  ACUITY  - Acute  - Chronic  - Acute on Chronic  And   TYPE  - Systolic  - Diastolic  - Combined  Thank You,  Jerral Ralphathy R Kayleigh Broadwell ,RN BSN CCDS Certified Clinical Documentation Specialist:  [phone]   [facility]- Health Information Management

## 2013-10-26 NOTE — Progress Notes (Signed)
SUBJECTIVE:  No pain.  No SOB.  Fistula bleeding overnight.  No atrial flutter.    PHYSICAL EXAM Filed Vitals:   10/25/13 1628 10/25/13 2119 10/26/13 0450 10/26/13 0555  BP: 125/50 110/50 143/77   Pulse: 52 53 54   Temp: 98 F (36.7 C) 98.3 F (36.8 C) 98.1 F (36.7 C)   TempSrc: Axillary Oral Oral   Resp: 18 18 18    Height:      Weight: 119 lb 4.3 oz (54.1 kg)   121 lb 14.4 oz (55.293 kg)  SpO2: 100% 100% 97%    General:  No distress Lungs:  Clear Heart:  RRR Abdomen:  Positive bowel sounds, no rebound no guarding Extremities:  No edema   LABS:  Results for orders placed during the hospital encounter of 10/22/13 (from the past 24 hour(s))  GLUCOSE, CAPILLARY     Status: Abnormal   Collection Time    10/25/13 11:25 AM      Result Value Ref Range   Glucose-Capillary 128 (*) 70 - 99 mg/dL  GLUCOSE, CAPILLARY     Status: Abnormal   Collection Time    10/25/13  5:10 PM      Result Value Ref Range   Glucose-Capillary 69 (*) 70 - 99 mg/dL   Comment 1 Documented in Chart     Comment 2 Notify RN    GLUCOSE, CAPILLARY     Status: Abnormal   Collection Time    10/25/13  6:16 PM      Result Value Ref Range   Glucose-Capillary 121 (*) 70 - 99 mg/dL  GLUCOSE, CAPILLARY     Status: Abnormal   Collection Time    10/25/13  9:13 PM      Result Value Ref Range   Glucose-Capillary 115 (*) 70 - 99 mg/dL   Comment 1 Documented in Chart     Comment 2 Notify RN    CBC     Status: Abnormal   Collection Time    10/26/13  4:50 AM      Result Value Ref Range   WBC 5.8  4.0 - 10.5 K/uL   RBC 3.46 (*) 3.87 - 5.11 MIL/uL   Hemoglobin 10.7 (*) 12.0 - 15.0 g/dL   HCT 16.132.9 (*) 09.636.0 - 04.546.0 %   MCV 95.1  78.0 - 100.0 fL   MCH 30.9  26.0 - 34.0 pg   MCHC 32.5  30.0 - 36.0 g/dL   RDW 40.915.9 (*) 81.111.5 - 91.415.5 %   Platelets 198  150 - 400 K/uL  HEPARIN LEVEL (UNFRACTIONATED)     Status: Abnormal   Collection Time    10/26/13  4:50 AM      Result Value Ref Range   Heparin Unfractionated  0.71 (*) 0.30 - 0.70 IU/mL  GLUCOSE, CAPILLARY     Status: Abnormal   Collection Time    10/26/13  6:20 AM      Result Value Ref Range   Glucose-Capillary 159 (*) 70 - 99 mg/dL    Intake/Output Summary (Last 24 hours) at 10/26/13 0746 Last data filed at 10/26/13 0519  Gross per 24 hour  Intake  560.4 ml  Output   2500 ml  Net -1939.6 ml    ASSESSMENT AND PLAN:  ATRIAL FIB:  NSR. I will stop IV amiodarone and restart PO.  I will stop IV heparin.  She will remain on Plavix and ASA.  Per Dr. Alford HighlandMcLean's admission note not a good warfarin candidate with  frequent falls.    AS:  Medical management.    CAD  No further chest pain.  Continue medical management.   ESRD:  Bleeding from fistula.  I will ask renal to evaluate.  She is to have dialysis today.  Hbg stable.      Rollene Rotunda 10/26/2013 7:46 AM

## 2013-10-26 NOTE — Progress Notes (Signed)
ANTICOAGULATION CONSULT NOTE - Follow Up Consult  Pharmacy Consult for heparin Indication: chest pain/ACS  Labs:  Recent Labs  10/23/13 1115  10/24/13 0235 10/24/13 1417 10/24/13 2024 10/25/13 0645 10/26/13 0450  HGB  --   < > 10.1*  --   --  10.6* 10.7*  HCT  --   --  31.5*  --   --  32.4* 32.9*  PLT  --   --  221  --   --  203 198  HEPARINUNFRC 0.23*  < > 0.31  --  1.62* 0.60 0.71*  CREATININE  --   --   --  3.75*  --   --   --   TROPONINI <0.30  --   --   --   --   --   --   < > = values in this interval not displayed.   Assessment: 73yo female now bleeding from fistula site x6510min, holding pressure made the bleeding more manageable but continues to ooze; heparin level this am slightly above goal, H/H low but stable.  Goal of Therapy:  Heparin level 0.3-0.7 units/ml   Plan:  Will hold heparin gtt x2430min then resume at lower rate of 850 units/hr and check level in 8hr.  Vernard GamblesVeronda Rylee Nuzum, PharmD, BCPS  10/26/2013,5:53 AM

## 2013-10-27 ENCOUNTER — Inpatient Hospital Stay (HOSPITAL_COMMUNITY): Payer: Medicare Other

## 2013-10-27 HISTORY — PX: THORACENTESIS: SHX235

## 2013-10-27 LAB — GLUCOSE, CAPILLARY
GLUCOSE-CAPILLARY: 127 mg/dL — AB (ref 70–99)
GLUCOSE-CAPILLARY: 219 mg/dL — AB (ref 70–99)
Glucose-Capillary: 157 mg/dL — ABNORMAL HIGH (ref 70–99)

## 2013-10-27 LAB — CBC
HCT: 28.6 % — ABNORMAL LOW (ref 36.0–46.0)
Hemoglobin: 9.1 g/dL — ABNORMAL LOW (ref 12.0–15.0)
MCH: 30.2 pg (ref 26.0–34.0)
MCHC: 31.8 g/dL (ref 30.0–36.0)
MCV: 95 fL (ref 78.0–100.0)
PLATELETS: 193 10*3/uL (ref 150–400)
RBC: 3.01 MIL/uL — ABNORMAL LOW (ref 3.87–5.11)
RDW: 15.8 % — AB (ref 11.5–15.5)
WBC: 6.9 10*3/uL (ref 4.0–10.5)

## 2013-10-27 MED ORDER — METOPROLOL TARTRATE 25 MG PO TABS: 12.5000 mg | ORAL_TABLET | Freq: Two times a day (BID) | ORAL | Status: DC

## 2013-10-27 MED ORDER — AMIODARONE HCL 200 MG PO TABS: ORAL_TABLET | ORAL | Status: DC

## 2013-10-27 MED ORDER — ONDANSETRON HCL 4 MG PO TABS
4.0000 mg | ORAL_TABLET | Freq: Four times a day (QID) | ORAL | Status: DC | PRN
  Administered 2013-10-27: 4 mg via ORAL
  Filled 2013-10-27: qty 1

## 2013-10-27 NOTE — Progress Notes (Signed)
C/O nausea order received for  Zofran tablet 4 mg every 6 hrs prn. We will continue to monitor.

## 2013-10-27 NOTE — Progress Notes (Addendum)
  [facility] Progress Note   Subjective: another 2.9kg off yest with stable BP, wt down to 51.5 post HD, no complaints  Filed Vitals:   10/27/13 1101 10/27/13 1117 10/27/13 1124 10/27/13 1126  BP: 112/38 107/31 112/32 105/33  Pulse:      Temp:      TempSrc:      Resp:      Height:      Weight:      SpO2:      Exam  Alert, no distress, calm No jvd Decreased BS R lung 1/2 up, clear on L RRR harsh 3/6 SEM over precordium, no rub or gallop  Abd soft, nt, nd, no ascites  No LE edema  Neuro is nf, ox3  LUA AV fistula patent   CXR large R effusion, no better on 2/25 film ECHO- nl LV, RV mod-severe dysfxn   Dialysis: MWF [facility]  3.5h F160 52kg 3K/2.25 [place] L AVF Prof 4 Heparin none  Hectorol 2 Epo 2200   Assessment:  1 Large R pleural effusion- s/p thoracentesis today 2 Chest pain / hx CABG- trop's neg; not candidate for redo surgery, had complex PCI last admit to native vessel 3 Pulm HTN / severe RV dysfunction / moderate aortic stenosis 4 Afib / RVR- amiodarone and lowdose MTP 5 HTN/volume- currently at dry wt, breathing ok; BP's low normal, no BP meds on lowdose MTP for afib 6 Anemia- Hb 9.6, no esa for now 7 2HPT- Ca up, holding vit D, no binders 8 ESRD on hemodialysis    Plan- stable for d/c from renal standpoint   Vinson Moselleob Nour Scalise MD pager [phone]    cell [phone] 10/27/2013, 11:53 AM   Recent Labs Lab 10/22/13 1439 10/24/13 1417 10/26/13 1428  NA 139 132* 132*  K 3.3* 4.8 3.6*  CL 96 90* 90*  CO2 25 23 26   GLUCOSE 382* 164* 163*  BUN 11 21 13   CREATININE 2.43* 3.75* 2.80*  CALCIUM 7.5* 9.0 9.0  PHOS  --  4.8* 4.1    Recent Labs Lab 10/22/13 1439 10/24/13 1417 10/26/13 1428  AST 17  --   --   ALT 13  --   --   ALKPHOS 105  --   --   BILITOT <0.2*  --   --   PROT 5.9*  --   --   ALBUMIN 2.6* 3.0* 2.8*    Recent Labs Lab 10/22/13 1439  10/25/13 0645 10/26/13 0450 10/27/13 0345  WBC 7.0  < > 6.6 5.8 6.9  NEUTROABS 4.2   --   --   --   --   HGB 9.6*  < > 10.6* 10.7* 9.1*  HCT 29.8*  < > 32.4* 32.9* 28.6*  MCV 94.3  < > 93.9 95.1 95.0  PLT 227  < > 203 198 193  < > = values in this interval not displayed. Marland Kitchen. adenosine (ADENOCARD) IV  12 mg Intravenous Once  . adenosine (ADENOCARD) IV  6 mg Intravenous Once  . amiodarone  400 mg Oral BID  . aspirin  81 mg Oral Daily  . atorvastatin  40 mg Oral q1800  . clopidogrel  75 mg Oral Q breakfast  . insulin aspart  0-9 Units Subcutaneous TID WC  . metoprolol tartrate  12.5 mg Oral BID  . multivitamin  1 tablet Oral QHS     diphenhydrAMINE, docusate sodium, nitroGLYCERIN, ondansetron

## 2013-10-27 NOTE — Progress Notes (Addendum)
Pt discharged per Md order and protocol. Discharge instructions reviewed with patient and all questions answered. Pt aware of follow up appointments. Pt aware medications have been called into her pharmacy and will need to be picked up.

## 2013-10-27 NOTE — Evaluation (Signed)
Physical Therapy Evaluation Patient Details Name: [name] MRN: [id number] DOB: 08-Feb-1941 Today's Date: 10/27/2013 Time: 4098-1191 PT Time Calculation (min): 30 min  PT Assessment / Plan / Recommendation History of Present Illness  Adm with CP and atrial fibrillation; converted to NSR with medication. 2/28 thoracentesis to remove 1L of fluid. PMHx-DM, ESRD on HD, CAD, CABG  Clinical Impression  Patient evaluated by Physical Therapy with no further acute PT needs identified. All education has been completed and the patient has no further questions. Pt agrees she is slightly weak after hospitalization, however refuses HHPT reporting she will regain her strength with walking at home and going to/from OP dialysis. Pt aware that her MD may order HHPT even after she is discharged if she changes her mind.  PT is signing off. Thank you for this referral.     PT Assessment  Patent does not need any further PT services    Follow Up Recommendations  No PT follow up;Supervision/Assistance - 24 hour    Does the patient have the potential to tolerate intense rehabilitation      Barriers to Discharge        Equipment Recommendations  None recommended by PT    Recommendations for Other Services     Frequency      Precautions / Restrictions Precautions Precautions: Fall   Pertinent Vitals/Pain Denied pain; no dyspnea SaO2 94% on 2L throughout      Mobility  Bed Mobility Overal bed mobility: Modified Independent General bed mobility comments: with rail Transfers Overall transfer level: Needs assistance Equipment used: Rolling walker (2 wheeled) Transfers: Sit to/from Stand Sit to Stand: Supervision General transfer comment: vc for safe use of RW Ambulation/Gait Ambulation/Gait assistance: Min guard Ambulation Distance (Feet): 70 Feet Assistive device: Rolling walker (2 wheeled) Gait Pattern/deviations: Step-through pattern;Decreased stride length;Drifts right/left;Trunk  flexed Gait velocity interpretation: Below normal speed for age/gender General Gait Details: able to avoid obstacles in her path; slight stagger to her Left, self-corrected with bil UE support on RW    Exercises     PT Diagnosis:    PT Problem List:   PT Treatment Interventions:       PT Goals(Current goals can be found in the care plan section) Acute Rehab PT Goals Patient Stated Goal: go home today PT Goal Formulation: No goals set, d/c therapy  Visit Information  Last PT Received On: 10/27/13 Assistance Needed: +1 Reason Eval/Treat Not Completed: Patient at procedure or test/unavailable History of Present Illness: Adm with CP and atrial fibrillation; converted to NSR with medication. 2/28 thoracentesis to remove 1L of fluid. PMHx-DM, ESRD on HD, CAD, CABG       Prior Functioning  Home Living Family/patient expects to be discharged to:: Private residence Living Arrangements: Non-relatives/Friends (3 males live with her) Available Help at Discharge: Friend(s);Available 24 hours/day Type of Home: House Home Access: Stairs to enter [employer] of Steps: 6 Entrance Stairs-Rails: Right;Left Home Layout: One level Home Equipment: Walker - 4 wheels;Bedside commode;Shower seat Prior Function Level of Independence: Needs assistance Gait / Transfers Assistance Needed: reports she always has assist going up/down steps; inside home is modified independent Comments: uses borrowed cane most of the time Communication Communication: No difficulties    Cognition  Cognition Arousal/Alertness: Awake/alert Behavior During Therapy: WFL for tasks assessed/performed Overall Cognitive Status: Within Functional Limits for tasks assessed    Extremity/Trunk Assessment Upper Extremity Assessment Upper Extremity Assessment: Generalized weakness Lower Extremity Assessment Lower Extremity Assessment: Generalized weakness Cervical / Trunk Assessment  Cervical / Trunk Assessment:  Kyphotic   Balance Balance Overall balance assessment: History of Falls (>6 months ago per pt)  End of Session PT - End of Session Equipment Utilized During Treatment: Gait belt;Oxygen Activity Tolerance: Patient limited by fatigue Patient left: in chair;with call bell/phone within reach Nurse Communication: Mobility status;Other (comment) (pt eager to d/c home todya)  GP     Myan Locatelli 10/27/2013, 2:36 PM Pager [phone]

## 2013-10-27 NOTE — Discharge Summary (Signed)
ELECTROPHYSIOLOGY DISCHARGE SUMMARY   Patient ID: [name],  MRN: [id number], DOB/AGE: 10/20/1940 73 y.o.  Admit date: 10/22/2013 Discharge date: 10/27/2013  Primary Care Physician: Angelina PihImran Hague, MD Primary Cardiologist: Nicki Guadalajarahomas Kelly, MD  Primary Discharge Diagnosis:  1. Atrial fibrillation - per Dr. Wyline MoodBranch, plan for amio 400mg  bid x7 days, then 400mg  q day x 3 weeks, then 200mg  maintenance dose  2. Aortic stenosis - moderate to severe, medical management   3. Pleural effusion in setting of RV failure and pulmonary hypertension - s/p thoracentesis today (1 liter of clear amber fluid removed; specimen was not sent for labs) 4. Chest pain with known CAD s/p CABG and complex PCI - serial troponin negative - not candidate for redo surgery   Secondary Discharge Diagnoses:  1. ESRD on HD 2. Anemia 3. Chronic diastolic HF 4. PVD 5. COPD 6. DM  Procedures This Admission:  1. US guided thoracentesis FINDINGS: A total of approximately 1 L of clear, amber colored fluid was removed. A fluid sample was notsent for laboratory analysis. IMPRESSION: Successful ultrasound guided right thoracentesis yielding 1 L of pleural fluid. Read by: Brayton ElKevin Bruning PA-C PROCEDURE: An ultrasound guided thoracentesis was thoroughly discussed with the patient and questions answered. The benefits, risks, alternatives and complications were also discussed. The patient understands and wishes to proceed with the procedure. Written consent was obtained. Ultrasound was performed to localize and mark an adequate pocket of fluid in the right chest. The area was then prepped and draped in the normal sterile fashion. 1% Lidocaine was used for local anesthesia. Under ultrasound guidance a 19 gauge Yueh catheter was introduced. Thoracentesis was performed. The catheter was removed and a dressing applied. Complications: None immediate  History: Ms. [name] is a 73 year old woman with COPD, ongoing tobacco  abuse, CAD s/p CABG in 2011 (LIMA to LAD and vein grafts to the PDA and circumflex), moderate to severe AS and ESRD on HD.   She presented in Nov 2014 with chest pain, nausea and rapid atrial fibrillation while on dialysis. [facility]. Her cardiac enzymes were positive with a troponin of 10.6. Her atrial fibrilation was treated medically and she converted to SR. She was placed on IV heparin and nitrates. Cardiac catheterization was performed 07/06/2013 by Dr Tresa EndoKelly. This demonstrated a patent LMA to LAD, chronic occlusion of of the native circumflex and the vein graft to the circumflex, and occlusion of the saphenous vein graft to the PDA with a very small patent distal PDA too small to re- graft. Right heart catheterization demonstrated pulmonary hypertension. She had a gradient across the aortic valve with a calculated valve area of 1.1. Echocardiogram demonstrated aortic stenosis with calcified aortic valve, mild/moderate mitral regurgitation. LVEF was 50% with lateral wall HK. She was seen in consult by Dr. Alla GermanVanTright who felt she was not a candidate for surgical intervention. She underwent elective native RCA HSRA/PCI by Dr. Tresa EndoKelly on 07/11/2013.  Hospital Course: On 10/22/2013 she was admitted with chest pain and rapid AFib. She converted to SR shortly after admission with resolution of her symptoms. She was continued on PO amiodarone. On 10/23/2013 while in hemodialysis she developed a regular tachycardia with HR 140-145 bpm associated with hypotension (SBP 70s) and CP. HD stopped. ECG showed atrial tach vs atypical flutter. She was then started on IV amiodarone. She converted to SR. Nephrology following and per note - "complex situation with RV failure, pulm HTN and AS with hypotension  on HD causing recurrent/persistent volume overload. Also afib w RVR requiring rate control agents. Will likely require more dialysis (short and long-term),  considering 4 days per week." She developed a large right sided pleural effusion requiring thoracentesis on 10/27/2013. She tolerated this well without immediate complication. She has been evaluated by Nephrology and deemed stable for discharge home today. She has been evaluated by Physical Therapy who recommended no PT services. She has been seen, examined and deemed stable for discharge today by Dr. Dina Rich. She will follow-up in the office in 1-2 weeks.  Discharge Vitals: Blood pressure 116/55, pulse 53, temperature 97.6 F (36.4 C), temperature source Oral, resp. rate 19, height 5\' 4"  (1.626 m), weight 115 lb 3.2 oz (52.254 kg), SpO2 99.00%.   Labs: Lab Results  Component Value Date   WBC 6.9 10/27/2013   HGB 9.1* 10/27/2013   HCT 28.6* 10/27/2013   MCV 95.0 10/27/2013   PLT 193 10/27/2013    Recent Labs Lab 10/22/13 1439  10/26/13 1428  NA 139  < > 132*  K 3.3*  < > 3.6*  CL 96  < > 90*  CO2 25  < > 26  BUN 11  < > 13  CREATININE 2.43*  < > 2.80*  CALCIUM 7.5*  < > 9.0  PROT 5.9*  --   --   BILITOT <0.2*  --   --   ALKPHOS 105  --   --   ALT 13  --   --   AST 17  --   --   GLUCOSE 382*  < > 163*  < > = values in this interval not displayed. Lab Results  Component Value Date   CKTOTAL 45 11/11/2010   CKMB 2.2 11/11/2010   TROPONINI <0.30 10/23/2013     Disposition:  The patient is being discharged in stable condition.  Follow-up:     Follow-up Information   Follow up with Abelino Derrick, PA-C On 11/06/2013. (At 3:40 PM for hospital follow-up (Dr. Landry Dyke PA))    Specialty:  Cardiology   Contact information:   [address] [place] [address] [phone]      Discharge Medications:    Medication List    STOP taking these medications       amLODipine 2.5 MG tablet  Commonly known as:  NORVASC     diltiazem 120 MG 24 hr capsule  Commonly known as:  DILACOR XR      TAKE these medications       amiodarone 200 MG tablet  Commonly  known as:  PACERONE  Take 2 tabs (400 mg) twice daily for 7 days, then take 2 tabs (400 mg) once daily for 3 weeks then take 1 tab (200 mg) once daily thereafter     aspirin 81 MG chewable tablet  Chew 1 tablet (81 mg total) by mouth daily.     atorvastatin 40 MG tablet  Commonly known as:  LIPITOR  Take 1 tablet (40 mg total) by mouth daily at 6 PM.     clopidogrel 75 MG tablet  Commonly known as:  PLAVIX  Take 1 tablet (75 mg total) by mouth daily with breakfast.     insulin NPH-regular Human (70-30) 100 UNIT/ML injection  Commonly known as:  NOVOLIN 70/30  Inject 10 Units into the skin daily as needed (when blood sugar is low). As directed per sliding scale     metoprolol tartrate 25 MG tablet  Commonly known as:  LOPRESSOR  Take 0.5 tablets (12.5 mg total) by mouth 2 (two) times daily.     multivitamin Tabs tablet  Take 1 tablet by mouth at bedtime.     nitroGLYCERIN 0.4 MG SL tablet  Commonly known as:  NITROSTAT  Place 1 tablet (0.4 mg total) under the tongue every 5 (five) minutes x 3 doses as needed for chest pain.       Duration of Discharge Encounter: Greater than 30 minutes including physician time.  Limmie Patricia, PA-C 10/27/2013, 4:33 PM

## 2013-10-27 NOTE — Progress Notes (Signed)
PT Cancellation Note  Patient Details Name: [name] MRN: [id number] DOB: 11-Nov-1940   Cancelled Treatment:    Reason Eval/Treat Not Completed: Patient at procedure or test/unavailable. At ultrasound for ?thoracentesis   Sydell Prowell 10/27/2013, 11:45 AM Pager [phone]

## 2013-10-27 NOTE — Progress Notes (Signed)
Patient ID: Hadassah Pais, female   DOB: 1941/05/21, 73 y.o.   MRN: [id number]       Subjective:    No events overnight.   Objective:   Temp:  [97.4 F (36.3 C)-98.6 F (37 C)] 98.6 F (37 C) (02/28 0442) Pulse Rate:  [46-54] 52 (02/28 1000) Resp:  [19-25] 21 (02/28 0442) BP: (94-134)/(31-60) 105/33 mmHg (02/28 1126) SpO2:  [96 %-100 %] 96 % (02/28 0442) Weight:  [113 lb 8.6 oz (51.5 kg)-126 lb 5.2 oz (57.3 kg)] 115 lb 3.2 oz (52.254 kg) (02/28 0442) Last BM Date: 10/24/13  Filed Weights   10/26/13 1324 10/26/13 1712 10/27/13 0442  Weight: 126 lb 5.2 oz (57.3 kg) 113 lb 8.6 oz (51.5 kg) 115 lb 3.2 oz (52.254 kg)    Intake/Output Summary (Last 24 hours) at 10/27/13 1258 Last data filed at 10/26/13 2139  Gross per 24 hour  Intake    360 ml  Output   2900 ml  Net  -2540 ml    Telemetry:sinus bradycardia  Exam:  General:NAD  Resp:CTAB  Cardiac:RRR, 3/6 systolic murmur RUSB, no JVD  GI: abdomen, soft, NT, ND  MSK: no LE edema  Neuro: no focal deficits   Lab Results:  Basic Metabolic Panel:  Recent Labs Lab 10/22/13 1439 10/24/13 1417 10/26/13 1428  NA 139 132* 132*  K 3.3* 4.8 3.6*  CL 96 90* 90*  CO2 25 23 26   GLUCOSE 382* 164* 163*  BUN 11 21 13   CREATININE 2.43* 3.75* 2.80*  CALCIUM 7.5* 9.0 9.0    Liver Function Tests:  Recent Labs Lab 10/22/13 1439 10/24/13 1417 10/26/13 1428  AST 17  --   --   ALT 13  --   --   ALKPHOS 105  --   --   BILITOT <0.2*  --   --   PROT 5.9*  --   --   ALBUMIN 2.6* 3.0* 2.8*    CBC:  Recent Labs Lab 10/25/13 0645 10/26/13 0450 10/27/13 0345  WBC 6.6 5.8 6.9  HGB 10.6* 10.7* 9.1*  HCT 32.4* 32.9* 28.6*  MCV 93.9 95.1 95.0  PLT 203 198 193    Cardiac Enzymes:  Recent Labs Lab 10/23/13 0008 10/23/13 0525 10/23/13 1115  TROPONINI 0.45* <0.30 <0.30    BNP:  Recent Labs  10/22/13 1439  PROBNP >70000.0*    Coagulation: No results found for this basename: INR,  in the last 168  hours  ECG:   Medications:   Scheduled Medications: . adenosine (ADENOCARD) IV  12 mg Intravenous Once  . adenosine (ADENOCARD) IV  6 mg Intravenous Once  . amiodarone  400 mg Oral BID  . aspirin  81 mg Oral Daily  . atorvastatin  40 mg Oral q1800  . clopidogrel  75 mg Oral Q breakfast  . insulin aspart  0-9 Units Subcutaneous TID WC  . metoprolol tartrate  12.5 mg Oral BID  . multivitamin  1 tablet Oral QHS     Infusions:     PRN Medications:  diphenhydrAMINE, docusate sodium, nitroGLYCERIN, ondansetron     Assessment/Plan   1. ATRIAL FIB:  - IV amio converted to oral yesterday, she is managed on ASA and plavix per primary cardiogists notes, not on coumadin because of bleeding risk.  -plan for amio 400mg  bid x7 days, then 400mg  q day x 3 weeks, then 200mg  maintence  2. AS: - moderate to severe, medical management at this time.   3. CAD No further chest  pain. Continue medical management.   4. ESRD: - followed by renal, ok for discharge per most recent   5. Pleural effusion - s/p drainage yesterday, 1 liter of clear amber fluid removed. Specimen was not sent for labs  6. Chronic diastolic heart failure - appears euvolemic on exam, denies any SOB, DOE or orthopnea.   7. Dispo -await PT evaluation, otherwise medically okay with cardio follow up 1-2 weeks.    Dina RichJonathan Landry Lookingbill, M.D., F.A.C.C.

## 2013-10-27 NOTE — Procedures (Signed)
Successful US guided right thoracentesis. Yielded 1L of clear amber colored fluid fluid. Pt tolerated procedure well. No immediate complications.  Specimen was not sent for labs. CXR ordered.  Brayton ElBRUNING, Chasyn Cinque PA-C 10/27/2013 11:32 AM

## 2013-10-27 NOTE — Progress Notes (Signed)
Patient Name: [name] Date of Encounter: 10/27/2013  Principal Problem:   Chest pain Active Problems:   End stage renal disease   DM (diabetes mellitus)   HTN (hypertension)   HLD (hyperlipidemia)   COPD (chronic obstructive pulmonary disease)   Aortic stenosis, moderate at cath 07/11/13   PAF (paroxysmal atrial fibrillation)   CAD- CABG X 3 09/2009, HSRA/DES to native RCA 07/11/13   Atrial fibrillation with RVR   Atrial tachycardia   Hypotension arterial   Chronic right-sided heart failure    SUBJECTIVE: No chest pain, agrees to thoracentesis, has family at home. Uses cane to get around.  OBJECTIVE Filed Vitals:   10/26/13 1630 10/26/13 1712 10/26/13 2137 10/27/13 0442  BP: 112/49 121/50 112/50 134/60  Pulse: 47 47 51 54  Temp:  97.4 F (36.3 C) 97.9 F (36.6 C) 98.6 F (37 C)  TempSrc:  Oral Oral Oral  Resp: 23 20 20 21   Height:      Weight:  113 lb 8.6 oz (51.5 kg)  115 lb 3.2 oz (52.254 kg)  SpO2: 99% 100% 99% 96%    Intake/Output Summary (Last 24 hours) at 10/27/13 1610 Last data filed at 10/26/13 2139  Gross per 24 hour  Intake    720 ml  Output   2900 ml  Net  -2180 ml   Filed Weights   10/26/13 1324 10/26/13 1712 10/27/13 0442  Weight: 126 lb 5.2 oz (57.3 kg) 113 lb 8.6 oz (51.5 kg) 115 lb 3.2 oz (52.254 kg)    PHYSICAL EXAM General: Well developed, well nourished, female in no acute distress. Head: Normocephalic, atraumatic.  Neck: Supple without bruits, JVD 9 cm. Lungs:  Resp regular and unlabored, decreased BS bases R>L. Heart: RRR, S1, S2, no S3, S4, harsh murmur; no rub. Abdomen: Soft, non-tender, non-distended, BS + x 4.  Extremities: No clubbing, cyanosis, no edema.  Neuro: Alert and oriented X 3. Moves all extremities spontaneously. Psych: Normal affect.  LABS: CBC: Recent Labs  10/26/13 0450 10/27/13 0345  WBC 5.8 6.9  HGB 10.7* 9.1*  HCT 32.9* 28.6*  MCV 95.1 95.0  PLT 198 193   Basic Metabolic Panel: Recent  Labs  96/04/54 1417 10/26/13 1428  NA 132* 132*  K 4.8 3.6*  CL 90* 90*  CO2 23 26  GLUCOSE 164* 163*  BUN 21 13  CREATININE 3.75* 2.80*  CALCIUM 9.0 9.0  PHOS 4.8* 4.1   Liver Function Tests: Recent Labs  10/24/13 1417 10/26/13 1428  ALBUMIN 3.0* 2.8*   BNP: Pro B Natriuretic peptide (BNP)  Date/Time Value Ref Range Status  10/22/2013  2:39 PM >70000.0* 0 - 125 pg/mL Final   TELE:        Radiology/Studies: Dg Chest Port 1 View 10/24/2013   CLINICAL DATA:  Hemodialysis today.  Followup pleural effusion.  EXAM: PORTABLE CHEST - 1 VIEW  COMPARISON:  Q 11/16/2013  FINDINGS: Moderate to large pleural effusion on the right appears slightly larger than it did on the prior study.  No pulmonary edema. No convincing infiltrate. No left pleural effusion.  Changes from CABG surgery are stable. The cardiac silhouette is normal in size. Normal mediastinal contours.  IMPRESSION: 1. Moderate to large right pleural effusion has mildly increased from the prior study. No other change. No pulmonary edema or convincing infiltrate.   Electronically Signed   By: Amie Portland M.D.   On: 10/24/2013 19:41    Current Medications:  . adenosine (ADENOCARD) IV  12 mg Intravenous Once  . adenosine (ADENOCARD) IV  6 mg Intravenous Once  . amiodarone  400 mg Oral BID  . aspirin  81 mg Oral Daily  . atorvastatin  40 mg Oral q1800  . clopidogrel  75 mg Oral Q breakfast  . insulin aspart  0-9 Units Subcutaneous TID WC  . metoprolol tartrate  12.5 mg Oral BID  . multivitamin  1 tablet Oral QHS      ASSESSMENT AND PLAN: 73 yo female with ESRD, CAD, AS, preserved EF, CABG 2011, LIMA-LAD only patent graft at cath 06/2013, PCI of RCA. NSTEMI 10/06/2013 insetting of atrial tach or fib during HD. Medical Rx. Admitted 02/23 w/ chest pain during HD, in setting of rapid ?atrial tach.   Principal Problem:  Chest pain - initial troponin some elevation, otherwise normal, felt demand ischemia in the setting of  tachycardia, medical therapy w/ ASA, statin and metoprolol at 12.5 bid. 1 dose held daily due to low HR. MD advise on change to low-dose Coreg.  Active Problems:  End stage renal disease- HD MWF in [place] - per renal team  Lg right pleural effusion -  thoracentesis by IR ordered, they will do later today.  DM (diabetes mellitus) - SSI   HTN (hypertension) - good control w/ SBP 90s-130s.  HLD (hyperlipidemia) - on statin   COPD (chronic obstructive pulmonary disease) - not nebs, inhalers at home   Aortic stenosis, Mean gradient: 43mm Hg (S). Peak gradient: 81mm Hg (S) at echo 02/07 - Volume mgt w/ HD, MD advise if pt needs TAVR eval.   PAF (paroxysmal atrial fibrillation) - episodic, happened at HD, improved w/ IV amio, now on 400 mg BID, was on 200 mg BID PTA. Continue current dose for now. If nausea continues, may need to decrease.  CAD- CABG X 3 09/2009, HSRA/DES to native RCA 07/11/13 - on ASA, Plavix, BB, statin  Atrial fibrillation with RVR/Atrial tachycardia - see above  Hypotension arterial - improved  Principal Problem:   Chest pain -  Active Problems:   End stage renal disease   DM (diabetes mellitus)   HTN (hypertension)   HLD (hyperlipidemia)   COPD (chronic obstructive pulmonary disease)   Aortic stenosis, moderate at cath 07/11/13   PAF (paroxysmal atrial fibrillation)   CAD- CABG X 3 09/2009, HSRA/DES to native RCA 07/11/13   Atrial fibrillation with RVR   Atrial tachycardia   Hypotension arterial   Chronic right-sided heart failure  Plan - d/c when medically stable.   Melida QuitterSigned, Kidada Ging , PA-C 8:08 AM 10/27/2013

## 2013-10-29 ENCOUNTER — Encounter (HOSPITAL_COMMUNITY)
Admission: EM | Disposition: A | Discharge status: Skilled Nursing Facility | Payer: Self-pay | Source: Home / Self Care | Attending: Internal Medicine

## 2013-10-29 ENCOUNTER — Emergency Department (HOSPITAL_COMMUNITY): Payer: Medicare Other

## 2013-10-29 ENCOUNTER — Inpatient Hospital Stay (HOSPITAL_COMMUNITY)
Admission: EM | Admit: 2013-10-29 | Discharge: 2013-11-12 | DRG: 371 | Disposition: A | Discharge status: Skilled Nursing Facility | Payer: Medicare Other | Attending: Internal Medicine | Admitting: Internal Medicine

## 2013-10-29 ENCOUNTER — Encounter (HOSPITAL_COMMUNITY): Payer: Self-pay | Admitting: Emergency Medicine

## 2013-10-29 ENCOUNTER — Ambulatory Visit (HOSPITAL_COMMUNITY): Admit: 2013-10-29 | Payer: Self-pay | Admitting: Cardiology

## 2013-10-29 DIAGNOSIS — I4891 Unspecified atrial fibrillation: Secondary | ICD-10-CM | POA: Diagnosis present

## 2013-10-29 DIAGNOSIS — I50812 Chronic right heart failure: Secondary | ICD-10-CM | POA: Diagnosis present

## 2013-10-29 DIAGNOSIS — E785 Hyperlipidemia, unspecified: Secondary | ICD-10-CM | POA: Diagnosis present

## 2013-10-29 DIAGNOSIS — Z7982 Long term (current) use of aspirin: Secondary | ICD-10-CM

## 2013-10-29 DIAGNOSIS — K5900 Constipation, unspecified: Secondary | ICD-10-CM | POA: Diagnosis present

## 2013-10-29 DIAGNOSIS — I2510 Atherosclerotic heart disease of native coronary artery without angina pectoris: Secondary | ICD-10-CM | POA: Diagnosis present

## 2013-10-29 DIAGNOSIS — R000 Tachycardia, unspecified: Secondary | ICD-10-CM | POA: Diagnosis not present

## 2013-10-29 DIAGNOSIS — E871 Hypo-osmolality and hyponatremia: Secondary | ICD-10-CM | POA: Diagnosis not present

## 2013-10-29 DIAGNOSIS — Z66 Do not resuscitate: Secondary | ICD-10-CM | POA: Diagnosis not present

## 2013-10-29 DIAGNOSIS — E46 Unspecified protein-calorie malnutrition: Secondary | ICD-10-CM | POA: Diagnosis present

## 2013-10-29 DIAGNOSIS — Z951 Presence of aortocoronary bypass graft: Secondary | ICD-10-CM

## 2013-10-29 DIAGNOSIS — J4489 Other specified chronic obstructive pulmonary disease: Secondary | ICD-10-CM | POA: Diagnosis present

## 2013-10-29 DIAGNOSIS — I350 Nonrheumatic aortic (valve) stenosis: Secondary | ICD-10-CM | POA: Diagnosis present

## 2013-10-29 DIAGNOSIS — Z794 Long term (current) use of insulin: Secondary | ICD-10-CM

## 2013-10-29 DIAGNOSIS — I214 Non-ST elevation (NSTEMI) myocardial infarction: Secondary | ICD-10-CM | POA: Diagnosis not present

## 2013-10-29 DIAGNOSIS — N2581 Secondary hyperparathyroidism of renal origin: Secondary | ICD-10-CM | POA: Diagnosis present

## 2013-10-29 DIAGNOSIS — E1129 Type 2 diabetes mellitus with other diabetic kidney complication: Secondary | ICD-10-CM | POA: Diagnosis present

## 2013-10-29 DIAGNOSIS — I480 Paroxysmal atrial fibrillation: Secondary | ICD-10-CM

## 2013-10-29 DIAGNOSIS — A0472 Enterocolitis due to Clostridium difficile, not specified as recurrent: Secondary | ICD-10-CM

## 2013-10-29 DIAGNOSIS — I252 Old myocardial infarction: Secondary | ICD-10-CM

## 2013-10-29 DIAGNOSIS — I10 Essential (primary) hypertension: Secondary | ICD-10-CM | POA: Diagnosis present

## 2013-10-29 DIAGNOSIS — R001 Bradycardia, unspecified: Secondary | ICD-10-CM | POA: Diagnosis present

## 2013-10-29 DIAGNOSIS — I498 Other specified cardiac arrhythmias: Secondary | ICD-10-CM

## 2013-10-29 DIAGNOSIS — E119 Type 2 diabetes mellitus without complications: Secondary | ICD-10-CM | POA: Diagnosis present

## 2013-10-29 DIAGNOSIS — Z87891 Personal history of nicotine dependence: Secondary | ICD-10-CM

## 2013-10-29 DIAGNOSIS — Y9210 Unspecified residential institution as the place of occurrence of the external cause: Secondary | ICD-10-CM | POA: Diagnosis present

## 2013-10-29 DIAGNOSIS — Z992 Dependence on renal dialysis: Secondary | ICD-10-CM

## 2013-10-29 DIAGNOSIS — J449 Chronic obstructive pulmonary disease, unspecified: Secondary | ICD-10-CM | POA: Diagnosis present

## 2013-10-29 DIAGNOSIS — I359 Nonrheumatic aortic valve disorder, unspecified: Secondary | ICD-10-CM

## 2013-10-29 DIAGNOSIS — I2789 Other specified pulmonary heart diseases: Secondary | ICD-10-CM | POA: Diagnosis present

## 2013-10-29 DIAGNOSIS — I25810 Atherosclerosis of coronary artery bypass graft(s) without angina pectoris: Secondary | ICD-10-CM

## 2013-10-29 DIAGNOSIS — K644 Residual hemorrhoidal skin tags: Secondary | ICD-10-CM | POA: Diagnosis present

## 2013-10-29 DIAGNOSIS — N186 End stage renal disease: Secondary | ICD-10-CM | POA: Diagnosis present

## 2013-10-29 DIAGNOSIS — I469 Cardiac arrest, cause unspecified: Secondary | ICD-10-CM | POA: Diagnosis present

## 2013-10-29 DIAGNOSIS — F17200 Nicotine dependence, unspecified, uncomplicated: Secondary | ICD-10-CM | POA: Diagnosis present

## 2013-10-29 DIAGNOSIS — I471 Supraventricular tachycardia: Secondary | ICD-10-CM

## 2013-10-29 DIAGNOSIS — E861 Hypovolemia: Secondary | ICD-10-CM | POA: Diagnosis not present

## 2013-10-29 DIAGNOSIS — I953 Hypotension of hemodialysis: Secondary | ICD-10-CM | POA: Diagnosis present

## 2013-10-29 DIAGNOSIS — I959 Hypotension, unspecified: Secondary | ICD-10-CM | POA: Diagnosis not present

## 2013-10-29 DIAGNOSIS — I509 Heart failure, unspecified: Secondary | ICD-10-CM | POA: Diagnosis present

## 2013-10-29 DIAGNOSIS — I5032 Chronic diastolic (congestive) heart failure: Secondary | ICD-10-CM | POA: Diagnosis present

## 2013-10-29 DIAGNOSIS — I120 Hypertensive chronic kidney disease with stage 5 chronic kidney disease or end stage renal disease: Secondary | ICD-10-CM | POA: Diagnosis present

## 2013-10-29 DIAGNOSIS — J90 Pleural effusion, not elsewhere classified: Secondary | ICD-10-CM | POA: Diagnosis present

## 2013-10-29 DIAGNOSIS — T465X5A Adverse effect of other antihypertensive drugs, initial encounter: Secondary | ICD-10-CM | POA: Diagnosis present

## 2013-10-29 DIAGNOSIS — I495 Sick sinus syndrome: Secondary | ICD-10-CM | POA: Diagnosis present

## 2013-10-29 DIAGNOSIS — M949 Disorder of cartilage, unspecified: Secondary | ICD-10-CM

## 2013-10-29 DIAGNOSIS — K625 Hemorrhage of anus and rectum: Secondary | ICD-10-CM

## 2013-10-29 DIAGNOSIS — K648 Other hemorrhoids: Secondary | ICD-10-CM | POA: Diagnosis present

## 2013-10-29 DIAGNOSIS — M899 Disorder of bone, unspecified: Secondary | ICD-10-CM | POA: Diagnosis present

## 2013-10-29 DIAGNOSIS — D649 Anemia, unspecified: Secondary | ICD-10-CM | POA: Diagnosis present

## 2013-10-29 DIAGNOSIS — I739 Peripheral vascular disease, unspecified: Secondary | ICD-10-CM | POA: Diagnosis present

## 2013-10-29 HISTORY — DX: Nonrheumatic aortic (valve) stenosis: I35.0

## 2013-10-29 LAB — CBC WITH DIFFERENTIAL/PLATELET
Basophils Absolute: 0 10*3/uL (ref 0.0–0.1)
Basophils Relative: 0 % (ref 0–1)
EOS ABS: 0 10*3/uL (ref 0.0–0.7)
Eosinophils Relative: 0 % (ref 0–5)
HEMATOCRIT: 30.9 % — AB (ref 36.0–46.0)
Hemoglobin: 10.1 g/dL — ABNORMAL LOW (ref 12.0–15.0)
LYMPHS ABS: 0.6 10*3/uL — AB (ref 0.7–4.0)
LYMPHS PCT: 3 % — AB (ref 12–46)
MCH: 31.2 pg (ref 26.0–34.0)
MCHC: 32.7 g/dL (ref 30.0–36.0)
MCV: 95.4 fL (ref 78.0–100.0)
MONOS PCT: 9 % (ref 3–12)
Monocytes Absolute: 1.8 10*3/uL — ABNORMAL HIGH (ref 0.1–1.0)
Neutro Abs: 16.8 10*3/uL — ABNORMAL HIGH (ref 1.7–7.7)
Neutrophils Relative %: 88 % — ABNORMAL HIGH (ref 43–77)
Platelets: 170 10*3/uL (ref 150–400)
RBC: 3.24 MIL/uL — ABNORMAL LOW (ref 3.87–5.11)
RDW: 15.9 % — ABNORMAL HIGH (ref 11.5–15.5)
WBC: 19.2 10*3/uL — ABNORMAL HIGH (ref 4.0–10.5)

## 2013-10-29 LAB — GLUCOSE, CAPILLARY
GLUCOSE-CAPILLARY: 145 mg/dL — AB (ref 70–99)
Glucose-Capillary: 229 mg/dL — ABNORMAL HIGH (ref 70–99)

## 2013-10-29 LAB — I-STAT TROPONIN, ED: Troponin i, poc: 0.04 ng/mL (ref 0.00–0.08)

## 2013-10-29 LAB — I-STAT CHEM 8, ED
BUN: 29 mg/dL — AB (ref 6–23)
CREATININE: 3.6 mg/dL — AB (ref 0.50–1.10)
Calcium, Ion: 1.13 mmol/L (ref 1.13–1.30)
Chloride: 97 mEq/L (ref 96–112)
GLUCOSE: 250 mg/dL — AB (ref 70–99)
HCT: 36 % (ref 36.0–46.0)
HEMOGLOBIN: 12.2 g/dL (ref 12.0–15.0)
POTASSIUM: 3.6 meq/L — AB (ref 3.7–5.3)
Sodium: 138 mEq/L (ref 137–147)
TCO2: 26 mmol/L (ref 0–100)

## 2013-10-29 LAB — HEPATIC FUNCTION PANEL
ALBUMIN: 3.5 g/dL (ref 3.5–5.2)
ALK PHOS: 157 U/L — AB (ref 39–117)
ALT: 20 U/L (ref 0–35)
AST: 27 U/L (ref 0–37)
BILIRUBIN TOTAL: 0.4 mg/dL (ref 0.3–1.2)
Total Protein: 7.2 g/dL (ref 6.0–8.3)

## 2013-10-29 LAB — MAGNESIUM: Magnesium: 2 mg/dL (ref 1.5–2.5)

## 2013-10-29 LAB — CBG MONITORING, ED: Glucose-Capillary: 227 mg/dL — ABNORMAL HIGH (ref 70–99)

## 2013-10-29 LAB — TROPONIN I

## 2013-10-29 LAB — I-STAT CG4 LACTIC ACID, ED: LACTIC ACID, VENOUS: 3.43 mmol/L — AB (ref 0.5–2.2)

## 2013-10-29 LAB — POC OCCULT BLOOD, ED: Fecal Occult Bld: POSITIVE — AB

## 2013-10-29 LAB — MRSA PCR SCREENING: MRSA by PCR: NEGATIVE

## 2013-10-29 SURGERY — LEFT HEART CATHETERIZATION WITH CORONARY ANGIOGRAM
Anesthesia: LOCAL

## 2013-10-29 MED ORDER — DOCUSATE SODIUM 100 MG PO CAPS: 100.0000 mg | ORAL_CAPSULE | Freq: Every day | ORAL | Status: DC

## 2013-10-29 MED ORDER — DARBEPOETIN ALFA-POLYSORBATE 25 MCG/0.42ML IJ SOLN
12.5000 ug | INTRAMUSCULAR | Status: DC
  Filled 2013-10-29: qty 0.42

## 2013-10-29 MED ORDER — INSULIN ASPART 100 UNIT/ML ~~LOC~~ SOLN
0.0000 [IU] | Freq: Three times a day (TID) | SUBCUTANEOUS | Status: DC
  Administered 2013-10-29: 3 [IU] via SUBCUTANEOUS
  Administered 2013-10-30 (×2): 2 [IU] via SUBCUTANEOUS

## 2013-10-29 MED ORDER — HEPARIN SODIUM (PORCINE) 5000 UNIT/ML IJ SOLN
5000.0000 [IU] | Freq: Three times a day (TID) | INTRAMUSCULAR | Status: DC
Start: 2013-10-29 — End: 2013-11-09
  Administered 2013-10-29 – 2013-11-09 (×30): 5000 [IU] via SUBCUTANEOUS
  Filled 2013-10-29 (×35): qty 1

## 2013-10-29 MED ORDER — ATORVASTATIN CALCIUM 40 MG PO TABS
40.0000 mg | ORAL_TABLET | Freq: Every day | ORAL | Status: DC
  Administered 2013-10-29 – 2013-11-11 (×13): 40 mg via ORAL
  Filled 2013-10-29 (×17): qty 1

## 2013-10-29 MED ORDER — AMIODARONE HCL 200 MG PO TABS
400.0000 mg | ORAL_TABLET | Freq: Two times a day (BID) | ORAL | Status: DC
  Filled 2013-10-29: qty 2

## 2013-10-29 MED ORDER — SODIUM CHLORIDE 0.9 % IV BOLUS (SEPSIS)
250.0000 mL | Freq: Once | INTRAVENOUS | Status: AC
  Administered 2013-10-29: 250 mL via INTRAVENOUS

## 2013-10-29 MED ORDER — SODIUM CHLORIDE 0.9 % IJ SOLN
3.0000 mL | Freq: Two times a day (BID) | INTRAMUSCULAR | Status: DC
  Administered 2013-10-29 – 2013-11-11 (×24): 3 mL via INTRAVENOUS

## 2013-10-29 MED ORDER — ASPIRIN 81 MG PO CHEW
81.0000 mg | CHEWABLE_TABLET | Freq: Every day | ORAL | Status: DC
  Administered 2013-10-30 – 2013-11-12 (×14): 81 mg via ORAL
  Filled 2013-10-29 (×15): qty 1

## 2013-10-29 MED ORDER — ACETAMINOPHEN 650 MG RE SUPP: 650.0000 mg | Freq: Four times a day (QID) | RECTAL | Status: DC | PRN

## 2013-10-29 MED ORDER — CLOPIDOGREL BISULFATE 75 MG PO TABS
75.0000 mg | ORAL_TABLET | Freq: Every day | ORAL | Status: DC
  Administered 2013-10-30 – 2013-11-05 (×7): 75 mg via ORAL
  Filled 2013-10-29 (×9): qty 1

## 2013-10-29 MED ORDER — AMIODARONE HCL 200 MG PO TABS
200.0000 mg | ORAL_TABLET | Freq: Every day | ORAL | Status: DC
  Administered 2013-10-29 – 2013-11-12 (×15): 200 mg via ORAL
  Filled 2013-10-29 (×16): qty 1

## 2013-10-29 MED ORDER — DARBEPOETIN ALFA-POLYSORBATE 25 MCG/0.42ML IJ SOLN: 12.5000 ug | INTRAMUSCULAR | Status: DC

## 2013-10-29 MED ORDER — ACETAMINOPHEN 325 MG PO TABS
650.0000 mg | ORAL_TABLET | Freq: Four times a day (QID) | ORAL | Status: DC | PRN
  Administered 2013-11-02 – 2013-11-12 (×15): 650 mg via ORAL
  Filled 2013-10-29 (×15): qty 2

## 2013-10-29 MED ORDER — DOXERCALCIFEROL 4 MCG/2ML IV SOLN
2.0000 ug | INTRAVENOUS | Status: DC
  Administered 2013-10-31 – 2013-11-07 (×3): 2 ug via INTRAVENOUS
  Filled 2013-10-29 (×6): qty 2

## 2013-10-29 NOTE — ED Provider Notes (Signed)
I saw and evaluated the patient, reviewed the resident's note and I agree with the findings and plan.   .Face to face Exam:  General:  Awake HEENT:  Atraumatic Resp:  Normal effort Abd:  Nondistended Neuro:No focal weakness  CRITICAL CARE Performed by: Nelva NayBEATON,Shonn Farruggia L Total critical care time: 30 min Critical care time was exclusive of separately billable procedures and treating other patients. Critical care was necessary to treat or prevent imminent or life-threatening deterioration. Critical care was time spent personally by me on the following activities: development of treatment plan with patient and/or surrogate as well as nursing, discussions with consultants, evaluation of patient's response to treatment, examination of patient, obtaining history from patient or surrogate, ordering and performing treatments and interventions, ordering and review of laboratory studies, ordering and review of radiographic studies, pulse oximetry and re-evaluation of patient's condition.    Nelia Shiobert L Porfirio Bollier, MD 10/29/13 1250

## 2013-10-29 NOTE — Progress Notes (Signed)
    Responded to code stemi page to be with patient. Per EMS: pt from dialysis center. pt arrived to ED alert and responsive with some lethargy.  Code stemi cancelled. No family . Will follow as needed.  10/29/13 0900  Clinical Encounter Type  Visited With Patient;Health care provider  Visit Type Spiritual support  Referral From Nurse  Spiritual Encounters  Spiritual Needs Emotional  Fae PippinWatlington, Jahlia Omura, Chaplain, pager [phone]

## 2013-10-29 NOTE — Progress Notes (Signed)
2 HD needles d/c'ed from left arm, pressure held x 30 min, bleeding is controled. Pressure dressing applied. Consuello Masseimmons, Lavontae Cornia M

## 2013-10-29 NOTE — ED Provider Notes (Signed)
CSN: [id number]     Arrival date & time 10/29/13  4098 History   First MD Initiated Contact with Patient 10/29/13 [phone]     Chief Complaint  Patient presents with  . post arrest     HPI  73 year old woman with COPD, ongoing tobacco abuse, CAD s/p CABG in 2011 (LIMA to LAD and vein grafts to the PDA and circumflex), moderate to severe AS and ESRD on HD. Cardiac catheterization was performed 07/06/13 by Dr Tresa Endo. This demonstrated a patent left IMA to LAD, chronic occlusion of of the native circumflex and the vein graft to the circumflex, and occlusion of the saphenous vein graft to the PDA with a very small patent distal PDA too small to re- graft, Right heart catheterization demonstrated pulmonary hypertension. Patient [facility] from [facility]  after becoming unresponsive at nursing home per report patient received 5 minutes of chest compressions before regaining consciousness.  AED did not call for shock. No other information is available at this time. The patient is denying any complaints. She is alert and oriented to person and place.    Past Medical History  Diagnosis Date  . CHF (congestive heart failure)     diast SHF, RV failure, pulm HTN  . Diabetes mellitus   . ESRD on hemodialysis     HD MWF in [place]   . Hyperlipidemia   . Anemia   . Hypertension   . COPD (chronic obstructive pulmonary disease)     not on home oxygen  . Peripheral vascular disease   . Coronary artery disease     Hx CABG. Most recent cath 07/06/13 for CP episode showed occlusive graft disease and moderate AS; pt was not a candidate for repeat surgery and a complex PCI/rotablator/stenting procedure of calcified native RCA was done by cardiology  . Hx of echocardiogram 03/2011    showed a mean gradient that had increased to 41 and a peak gradient that had increase to 70, she has moderate tricuspid regurigitation, mild to moderate mitral regurigitation, and significant LA dilation.  Marland Kitchen PAF (paroxysmal atrial  fibrillation)   . Chronic right-sided heart failure 10/25/2013   Past Surgical History  Procedure Laterality Date  . Coronary artery bypass graft  09/2009    x3 by Dr Alla German with LIMA to the LAD, a vein to the circumflex and a vien to the PDA.  Marland Kitchen Abdominal hysterectomy    . Thoracentesis  11-2009  . Av fistula placement  01-12-2008    left upper arm  . Cardiac catheterization  10/15/2009    which showed low normal LV function with mild inferior hypocontractility.   Family History  Problem Relation Age of Onset  . Heart disease Mother    History  Substance Use Topics  . Smoking status: Former Smoker -- 0.50 packs/day    Types: Cigarettes    Start date: 06/19/1973  . Smokeless tobacco: Never Used  . Alcohol Use: No   OB History   Grav Para Term Preterm Abortions TAB SAB Ect Mult Living                 Review of Systems  Constitutional: Negative for fever, chills, activity change and appetite change.  HENT: Negative for congestion, ear pain and rhinorrhea.   Eyes: Negative for pain.  Respiratory: Negative for cough and shortness of breath.   Cardiovascular: Negative for chest pain and palpitations.  Gastrointestinal: Negative for nausea, vomiting and abdominal pain.  Genitourinary: Negative for dysuria, difficulty urinating and pelvic  pain.  Musculoskeletal: Negative for back pain and neck pain.  Skin: Negative for rash and wound.  Neurological: Negative for weakness and headaches.  Psychiatric/Behavioral: Negative for behavioral problems, confusion and agitation.      Allergies  Codeine and Sulfa antibiotics  Home Medications   Current Outpatient Rx  Name  Route  Sig  Dispense  Refill  . amiodarone (PACERONE) 200 MG tablet      Take 2 tabs (400 mg) twice daily for 7 days, then take 2 tabs (400 mg) once daily for 3 weeks then take 1 tab (200 mg) once daily thereafter   60 tablet   0   . aspirin 81 MG chewable tablet   Oral   Chew 1 tablet (81 mg total) by  mouth daily.         Marland Kitchen. atorvastatin (LIPITOR) 40 MG tablet   Oral   Take 1 tablet (40 mg total) by mouth daily at 6 PM.   90 tablet   3   . clopidogrel (PLAVIX) 75 MG tablet   Oral   Take 1 tablet (75 mg total) by mouth daily with breakfast.   90 tablet   3   . insulin NPH-insulin regular (NOVOLIN 70/30) (70-30) 100 UNIT/ML injection   Subcutaneous   Inject 10 Units into the skin daily as needed (when blood sugar is low). As directed per sliding scale         . metoprolol tartrate (LOPRESSOR) 25 MG tablet   Oral   Take 0.5 tablets (12.5 mg total) by mouth 2 (two) times daily.   60 tablet   4   . multivitamin (RENA-VIT) TABS tablet   Oral   Take 1 tablet by mouth at bedtime.   90 tablet   3   . nitroGLYCERIN (NITROSTAT) 0.4 MG SL tablet   Sublingual   Place 1 tablet (0.4 mg total) under the tongue every 5 (five) minutes x 3 doses as needed for chest pain.   25 tablet   2    BP 122/36  Pulse 50  Temp(Src) 97.8 F (36.6 C) (Oral)  Resp 18  SpO2 94% Physical Exam  Constitutional: She is oriented to person, place, and time. She appears well-developed and well-nourished. No distress.  HENT:  Head: Normocephalic and atraumatic.  Nose: Nose normal.  Mouth/Throat: Oropharynx is clear and moist.  Eyes: EOM are normal. Pupils are equal, round, and reactive to light.  Neck: Normal range of motion. Neck supple. No tracheal deviation present.  Cardiovascular: Regular rhythm and intact distal pulses.   Murmur heard. bradycardic   Pulmonary/Chest: Effort normal and breath sounds normal. She has no rales.  Abdominal: Soft. Bowel sounds are normal. She exhibits no distension. There is no tenderness. There is no rebound and no guarding.  Musculoskeletal: Normal range of motion. She exhibits no tenderness.  Neurological: She is alert and oriented to person, place, and time.  Skin: Skin is warm and dry. No rash noted.  Psychiatric: She has a normal mood and affect. Her  behavior is normal.    ED Course  Procedures (including critical care time) Labs Review Labs Reviewed  CBC WITH DIFFERENTIAL - Abnormal; Notable for the following:    WBC 19.2 (*)    RBC 3.24 (*)    Hemoglobin 10.1 (*)    HCT 30.9 (*)    RDW 15.9 (*)    Neutrophils Relative % 88 (*)    Neutro Abs 16.8 (*)    Lymphocytes Relative 3 (*)  Lymphs Abs 0.6 (*)    Monocytes Absolute 1.8 (*)    All other components within normal limits  HEPATIC FUNCTION PANEL - Abnormal; Notable for the following:    Alkaline Phosphatase 157 (*)    All other components within normal limits  I-STAT CHEM 8, ED - Abnormal; Notable for the following:    Potassium 3.6 (*)    BUN 29 (*)    Creatinine, Ser 3.60 (*)    Glucose, Bld 250 (*)    All other components within normal limits  CBG MONITORING, ED - Abnormal; Notable for the following:    Glucose-Capillary 227 (*)    All other components within normal limits  I-STAT CG4 LACTIC ACID, ED - Abnormal; Notable for the following:    Lactic Acid, Venous 3.43 (*)    All other components within normal limits  POC OCCULT BLOOD, ED - Abnormal; Notable for the following:    Fecal Occult Bld POSITIVE (*)    All other components within normal limits  CBG MONITORING, ED  Rosezena Sensor, ED   Imaging Review Dg Chest 1 View  10/27/2013   CLINICAL DATA:  Status post right thoracentesis  EXAM: CHEST - 1 VIEW  COMPARISON:  US THORACENTESIS ASP PLEURAL SPACE W/IMG GUIDE dated 10/27/2013  FINDINGS: Status post CABG. Moderate cardiac enlargement. Left lung is clear.  On the right side, there is a small, decreased right pleural effusion status post thoracentesis. There is no pneumothorax.  IMPRESSION: No pneumothorax   Electronically Signed   By: Esperanza Heir M.D.   On: 10/27/2013 12:07   Dg Chest Portable 1 View  10/29/2013   CLINICAL DATA:  Cardiopulmonary arrest. Hypotension. Shortness of breath.  EXAM: PORTABLE CHEST - 1 VIEW  COMPARISON:  10/27/2013  FINDINGS:  Prior CABG. Moderately enlarged cardiopericardial silhouette with low lung volumes, small right pleural effusion, and interstitial accentuation. Low lung volumes. No pneumothorax or well-defined rib fracture.  IMPRESSION: 1. Cardiomegaly potentially with mild interstitial edema and a small right pleural effusion.   Electronically Signed   By: Herbie Baltimore M.D.   On: 10/29/2013 09:55     MDM   Final diagnoses:  Cardiac arrest  Sinus bradycardia  Chronic right-sided heart failure   73 yo F in NAD non toxic appearing who presents after episode of unresponsiveness and subsequent CPR by staff. On arrival patient is alert and oriented x 2. She has no complaints. Will obtain screening labs and imaging. Case co managed with my attending Dr. Radford Pax.   10:29 AM Spoke with Dr. Darrol Angel who felt patient would be appropriate for hospitalist team.   Case discussed with medicine team. Patient admitted for further care. Patient remained HDS throughout ED course. Lab work remarkable for elevated lactic acid and leukocytosis. CXR with Cardiomegaly potentially with mild interstitial edema and a small right pleural effusion. Patient denies cough or fevers- clinically does not appear to be PNA.    Nadara Mustard, MD 10/29/13 1146

## 2013-10-29 NOTE — Consult Note (Signed)
[facility] Renal Consultation Note    Indication for Consultation:  Management of ESRD/hemodialysis; anemia, hypertension/volume and secondary hyperparathyroidism  HPI: [name] is a 73 y.o. female with ESRD on MWF dialysis in [place] who was just discharged 2/28 following an admission for rapid atrial fibrillation and started on amiodarone..  She also had a thoracentesis of 1 L clear amber fluid 2/28. She has mod - severe AS, known CAD s/p CABG and complex PCI and not a candidate for redo surgery.  She was about one hour into her HD treatment today was noted to be staring, pulseless with agonal breathing, CPR/chest compressions were started but shocking was not indicated per AED  She "woke up" after about 10 minutes of CPR and was talking. She was [facility] emerg[facility].  Evaluation in the ED showed initial troponin 0.04, K 3.6 Hgb 10.1 WBC 19.2 with 89% neutrophils. CXR showed mild interstitial edema and small right pleural effusion. Her goal today pre HD was 3.2 (EDW had been lowered 0.5 last admission). She states the only thing different was that she just didn't feel good this am.  Denied CP, SOB, N, V, D.  Did say her "butt hole" hurt and has been having bleeding from it for several months.  Said someone had already looked at it as was checking on it. Pt seems confused to my interview.  Says she has no recollection of events and then asked me for candy.    Past Medical History  Diagnosis Date  . CHF (congestive heart failure)     diast SHF, RV failure, pulm HTN  . Diabetes mellitus   . ESRD on hemodialysis     HD MWF in [place]   . Hyperlipidemia   . Anemia   . Hypertension   . COPD (chronic obstructive pulmonary disease)     not on home oxygen  . Peripheral vascular disease   . Coronary artery disease     Hx CABG. Most recent cath 07/06/13 for CP episode showed occlusive graft disease and moderate AS; pt was not a candidate for repeat surgery and a  complex PCI/rotablator/stenting procedure of calcified native RCA was done by cardiology  . Hx of echocardiogram 03/2011    showed a mean gradient that had increased to 41 and a peak gradient that had increase to 70, she has moderate tricuspid regurigitation, mild to moderate mitral regurigitation, and significant LA dilation.  Marland Kitchen. PAF (paroxysmal atrial fibrillation)   . Chronic right-sided heart failure 10/25/2013   Past Surgical History  Procedure Laterality Date  . Coronary artery bypass graft  09/2009    x3 by Dr Alla GermanVanTright with LIMA to the LAD, a vein to the circumflex and a vien to the PDA.  Marland Kitchen. Abdominal hysterectomy    . Thoracentesis  11-2009  . Av fistula placement  01-12-2008    left upper arm  . Cardiac catheterization  10/15/2009    which showed low normal LV function with mild inferior hypocontractility.   Family History  Problem Relation Age of Onset  . Heart disease Mother    Social History:  reports that she has quit smoking. Her smoking use included Cigarettes. She started smoking about 40 years ago. She smoked 0.50 packs per day. She has never used smokeless tobacco. She reports that she does not drink alcohol or use illicit drugs. Allergies  Allergen Reactions  . Codeine Rash  . Sulfa Antibiotics Other (See Comments)    unknown   Prior to  Admission medications   Medication Sig Start Date End Date Taking? Authorizing Provider  amiodarone (PACERONE) 200 MG tablet Take 2 tabs (400 mg) twice daily for 7 days, then take 2 tabs (400 mg) once daily for 3 weeks then take 1 tab (200 mg) once daily thereafter 10/27/13  Yes Brooke O Edmisten, PA-C  aspirin 81 MG chewable tablet Chew 1 tablet (81 mg total) by mouth daily. 07/13/13  Yes Abelino Derrick, PA-C  atorvastatin (LIPITOR) 40 MG tablet Take 1 tablet (40 mg total) by mouth daily at 6 PM. 07/13/13  Yes Abelino Derrick, PA-C  clopidogrel (PLAVIX) 75 MG tablet Take 1 tablet (75 mg total) by mouth daily with breakfast. 07/13/13  Yes  Eda Paschal Kilroy, PA-C  insulin NPH-insulin regular (NOVOLIN 70/30) (70-30) 100 UNIT/ML injection Inject 10 Units into the skin daily as needed (when blood sugar is low). As directed per sliding scale   Yes Historical Provider, MD  metoprolol tartrate (LOPRESSOR) 25 MG tablet Take 0.5 tablets (12.5 mg total) by mouth 2 (two) times daily. 10/27/13  Yes Brooke O Edmisten, PA-C  multivitamin (RENA-VIT) TABS tablet Take 1 tablet by mouth at bedtime. 07/13/13  Yes Luke K Kilroy, PA-C  nitroGLYCERIN (NITROSTAT) 0.4 MG SL tablet Place 1 tablet (0.4 mg total) under the tongue every 5 (five) minutes x 3 doses as needed for chest pain. 07/13/13   Abelino Derrick, PA-C   No current facility-administered medications for this encounter.   Current Outpatient Prescriptions  Medication Sig Dispense Refill  . amiodarone (PACERONE) 200 MG tablet Take 2 tabs (400 mg) twice daily for 7 days, then take 2 tabs (400 mg) once daily for 3 weeks then take 1 tab (200 mg) once daily thereafter  60 tablet  0  . aspirin 81 MG chewable tablet Chew 1 tablet (81 mg total) by mouth daily.      Marland Kitchen atorvastatin (LIPITOR) 40 MG tablet Take 1 tablet (40 mg total) by mouth daily at 6 PM.  90 tablet  3  . clopidogrel (PLAVIX) 75 MG tablet Take 1 tablet (75 mg total) by mouth daily with breakfast.  90 tablet  3  . insulin NPH-insulin regular (NOVOLIN 70/30) (70-30) 100 UNIT/ML injection Inject 10 Units into the skin daily as needed (when blood sugar is low). As directed per sliding scale      . metoprolol tartrate (LOPRESSOR) 25 MG tablet Take 0.5 tablets (12.5 mg total) by mouth 2 (two) times daily.  60 tablet  4  . multivitamin (RENA-VIT) TABS tablet Take 1 tablet by mouth at bedtime.  90 tablet  3  . nitroGLYCERIN (NITROSTAT) 0.4 MG SL tablet Place 1 tablet (0.4 mg total) under the tongue every 5 (five) minutes x 3 doses as needed for chest pain.  25 tablet  2   Labs: Basic Metabolic Panel:  Recent Labs Lab 10/22/13 1439 10/24/13 1417  10/26/13 1428 10/29/13 0955  NA 139 132* 132* 138  K 3.3* 4.8 3.6* 3.6*  CL 96 90* 90* 97  CO2 25 23 26   --   GLUCOSE 382* 164* 163* 250*  BUN 11 21 13  29*  CREATININE 2.43* 3.75* 2.80* 3.60*  CALCIUM 7.5* 9.0 9.0  --   PHOS  --  4.8* 4.1  --    Liver Function Tests:  Recent Labs Lab 10/22/13 1439 10/24/13 1417 10/26/13 1428 10/29/13 0944  AST 17  --   --  27  ALT 13  --   --  20  ALKPHOS 105  --   --  157*  BILITOT <0.2*  --   --  0.4  PROT 5.9*  --   --  7.2  ALBUMIN 2.6* 3.0* 2.8* 3.5  CBC:  Recent Labs Lab 10/22/13 1439 10/24/13 0235 10/25/13 0645 10/26/13 0450 10/27/13 0345 10/29/13 0944 10/29/13 0955  WBC 7.0 8.2 6.6 5.8 6.9 19.2*  --   NEUTROABS 4.2  --   --   --   --  16.8*  --   HGB 9.6* 10.1* 10.6* 10.7* 9.1* 10.1* 12.2  HCT 29.8* 31.5* 32.4* 32.9* 28.6* 30.9* 36.0  MCV 94.3 94.9 93.9 95.1 95.0 95.4  --   PLT 227 221 203 198 193 170  --    Cardiac Enzymes:  Recent Labs Lab 10/22/13 1439 10/23/13 0008 10/23/13 0525 10/23/13 1115  TROPONINI <0.30 0.45* <0.30 <0.30   CBG:  Recent Labs Lab 10/26/13 2131 10/27/13 0621 10/27/13 1210 10/27/13 1613 10/29/13 0916  GLUCAP 266* 157* 127* 219* 227*   Studies/Results: Dg Chest Portable 1 View  10/29/2013   CLINICAL DATA:  Cardiopulmonary arrest. Hypotension. Shortness of breath.  EXAM: PORTABLE CHEST - 1 VIEW  COMPARISON:  10/27/2013  FINDINGS: Prior CABG. Moderately enlarged cardiopericardial silhouette with low lung volumes, small right pleural effusion, and interstitial accentuation. Low lung volumes. No pneumothorax or well-defined rib fracture.  IMPRESSION: 1. Cardiomegaly potentially with mild interstitial edema and a small right pleural effusion.   Electronically Signed   By: Herbie Baltimore M.D.   On: 10/29/2013 09:55   ROS: As per HPI otherwise neg  Physical Exam: Filed Vitals:   10/29/13 1210 10/29/13 1230 10/29/13 1300 10/29/13 1311  BP:  132/41 125/39 125/39  Pulse:  49 47 50   Temp:      TempSrc:      Resp:  19 19 16   SpO2: 99% 100% 95% 94%     General: Well developed, well nourished, in no acute distress sleeping and rouses easily Head: Normocephalic, atraumatic, sclera non-icteric, mucus membranes are moist; dentition poor Neck: Supple. JVD not elevated. Lungs: Dim BS, Clear bilaterally to auscultation without wheezes, rales, or rhonchi. Breathing is unlabored. Heart:  bradycardia with 3/6 murmur Abdomen: Soft, non-tender, non-distended with normoactive bowel sounds. No rebound/guarding. No obvious abdominal masses. Lower extremities: 2 + edema skinned knee with dressing, fungal nails Neuro: Alert and oriented X 3. Moves all extremities spontaneously. Psych:  Responds to questions appropriately with a normal affect. Dialysis Access: left AVF+ bruit  Dialysis Orders: Dialysis: MWF [facility]  3.5h F160 52kg 3K/2.25 Bath L AVF Prof 4 Heparin none  Hectorol 2 Epo 2200 Hgb 10.5 2/18 sats 24% sat and ferritin 1600 in January; iPTH 433 1/28  Assessment/Plan: 1. Cardiac arrest during HD today - cards seeing; no EKG availalble; Dr. Eliott Nine said post CPR rhythm on AED was idioventricular; initial troponin 0.04- was it related to relatively large amount of fluid to take vs some type of arrythmia ? 2. ESRD -  MWF - post HD K 3.6 after 1 hour of HD - on 3 K bath - plan HD in am; use 4 K bath - need to keep K up with hx of atrial fibrillation; favor increasing time to 4 hours due to complex cardiac issues and provide more gently volume removal- I agree; if this doesn't help, could consider 4 day per week HD; will dialyze 3.5 hour on Tuesday - monitor her and  again Wed to get volume down and back on schedule. 3. Hypertension/volume  -  norvasc and diltiazem stopped last admission; on metoprolol only 4. Anemia  - ESA not given last admission; Hgb 10.1 - resume with HD Aranesp 12.5 5. Metabolic bone disease - resume hectorol and follow Ca levels 6. Nutrition - renal carb mod  diet 7. DM - per primary 8. COPD 9. AS  10. Atrial fibrillation - on new amiodarone load and BB  Sheffield Slider, PA-C [facility] [phone] 10/29/2013, 1:32 PM   Patient seen and examined, agree with above note with above modifications. Episode at dialysis today which maybe responded to CPR or was transient.  Will monitor with HD, try to achieve more gently volume removal and keep potassium up and see how she tolerates- will increase regular time up to 4 hours.  Appreciate cardiology input as well.  Annie Sable, MD 10/29/2013

## 2013-10-29 NOTE — Progress Notes (Signed)
Patients heart rate consistently in the 40's-50's. Paged MD and told ok to give the 200mg  amiodarone PO. Will give medication and continue to monitor.

## 2013-10-29 NOTE — ED Notes (Signed)
Per EMS: pt from dialysis center with CPR x 5 min per staff with no shock advised; pt arrived to ED alert and responsive with some lethargy; pt denies pain; pt incontinent of feces; pt cool to touch; 20g R AC

## 2013-10-29 NOTE — Progress Notes (Signed)
Night Float Interim Progress Note  Upon char review this evening, BP noted to be 90/30's. Called RN to confirm blood pressure and went to evaluate patient. Repeat blood pressure 90/40's and manual BP 88/39. Patient was sleeping but easily arousable and responding to questions with no complaints at this time.   Vitals reviewed. General: resting in bed, NAD, became irritated when answering questions HEENT: EOMI Cardiac: bradycardia, loud 4/6 DEM heard loudest RUSB Pulm: clear to auscultation bilaterally anterior exam only Abd: soft, BS present Ext: moving all 4 extremities Neuro: alert and oriented X3  [name] is a 73 year old African American female with CAD s/p CABG, severe AS, and ESRD on HD admitted s/p cardiac arrest and also noted to have BRBPR. Received ~1 hour of scheduled HD today.   -given current hypotension, will give small bolus of IVF (250cc) for now and monitor closely. Will likely need to hydrate more but with caution given CHF and ESRD.   Signed: Darden PalmerSamaya Jullien Granquist, MD PGY-2, Internal Medicine Resident Pager: [phone]  10/29/2013,11:42 PM

## 2013-10-29 NOTE — H&P (Signed)
Internal Medicine Attending Admission Note Date: 10/29/2013  Patient name: [name] Medical record number: [id number] Date of birth: [birth year] Age: 73 y.o. Gender: female  I saw and evaluated the patient. I reviewed the resident's note and I agree with the resident's findings and plan as documented in the resident's note, with the following additional comments.  Chief Complaint(s): Cardiac arrest  History - key components related to admission: Patient is a 73 year old woman with history of coronary artery disease status post CABG and PCI, recently hospitalized on the cardiology service with chest pain and rapid atrial fibrillation and started on amiodarone, moderate to severe aortic stenosis, end-stage renal disease on hemodialysis, atrial fibrillation, and other problems as outlined in the medical history, who reportedly became unresponsive and pulseless during hemodialysis today.  CPR was initiated and patient was transferred to the emergency department.  Patient is currently alert and without acute complaints; she does not remember any of these events today.  She denies chest pain, shortness of breath, or other problems.   Physical Exam - key components related to admission:  Filed Vitals:   10/29/13 1311 10/29/13 1400 10/29/13 1500 10/29/13 1513  BP: 125/39 105/60 120/37 120/37  Pulse: 50 40    Temp:  98 F (36.7 C) 98.5 F (36.9 C)   TempSrc:  Oral Oral   Resp: 16 15 18 21   Height:  5\' 4"  (1.626 m)    Weight:  119 lb 4.3 oz (54.1 kg)    SpO2: 94% 100% 100%     General: Alert, oriented; in no acute distress Lungs: Bibasilar crackles Heart: 3/6 harsh systolic murmur; no S3, no S4 Abdomen: Bowel sounds present, soft, nontender Extremities: No edema   Lab results:   Basic Metabolic Panel:  Recent Labs  40/98/11 0955  NA 138  K 3.6*  CL 97  GLUCOSE 250*  BUN 29*  CREATININE 3.60*    Liver Function Tests:  Recent Labs  10/29/13 0944  AST 27  ALT 20   ALKPHOS 157*  BILITOT 0.4  PROT 7.2  ALBUMIN 3.5     CBC:  Recent Labs  10/27/13 0345 10/29/13 0944 10/29/13 0955  WBC 6.9 19.2*  --   HGB 9.1* 10.1* 12.2  HCT 28.6* 30.9* 36.0  MCV 95.0 95.4  --   PLT 193 170  --     Recent Labs  10/29/13 0944  NEUTROABS 16.8*  LYMPHSABS 0.6*  MONOABS 1.8*  EOSABS 0.0  BASOSABS 0.0      CBG:  Recent Labs  10/26/13 2131 10/27/13 0621 10/27/13 1210 10/27/13 1613 10/29/13 0916 10/29/13 1621  GLUCAP 266* 157* 127* 219* 227* 229*     Imaging results:  Dg Chest Portable 1 View  10/29/2013   CLINICAL DATA:  Cardiopulmonary arrest. Hypotension. Shortness of breath.  EXAM: PORTABLE CHEST - 1 VIEW  COMPARISON:  10/27/2013  FINDINGS: Prior CABG. Moderately enlarged cardiopericardial silhouette with low lung volumes, small right pleural effusion, and interstitial accentuation. Low lung volumes. No pneumothorax or well-defined rib fracture.  IMPRESSION: 1. Cardiomegaly potentially with mild interstitial edema and a small right pleural effusion.   Electronically Signed   By: Herbie Baltimore M.D.   On: 10/29/2013 09:55    Other results: EKG: Sinus bradycardia; left axis deviation; right bundle branch block; T wave abnormality, consider lateral ischemia   Assessment & Plan by Problem:  1.  Cardiac arrest.  Patient is currently alert and without symptoms.  Cardiology was consulted, and noted bradycardia with an idioventricular  escape rhythm seen on AED interrogation; their plan is to consult EP for consideration of a pacemaker.  They also decreased patient's amiodarone dose.  Plan is monitor; serial cardiac enzymes; decision on pacemaker as per cardiology.  2.  End-stage renal disease on hemodialysis.  Plan is continue hemodialysis as per nephrology consult.  3.  Other problems and plans as per the resident physician's note.

## 2013-10-29 NOTE — H&P (Signed)
Date: 10/29/2013               Patient Name:  BRETTANY SYDNEY MRN: [id number]  DOB: [birth year] Age / Sex: 73 y.o., female   PCP: Shelbie Ammons, MD         Medical Service: Internal Medicine Teaching Service         Attending Physician: Dr. Margarito Liner    First Contact: Dr. Glendell Docker Pager: [phone]  Second Contact: Dr. Burtis Junes Pager: [phone]       After Hours (After 5p/  First Contact Pager: [phone]  weekends / holidays): Second Contact Pager: [phone]   Chief Complaint: Cardiac arrest  History of Present Illness:  The patient is a 73 yo woman, history of CAD s/p CABG, moderate-severe Aortic Stenosis, ESRD on MWF HD, pAfib, presenting with cardiac arrest.  This morning, the patient was in her usual self when she presented for her regular HD session.  After about 1 hour of HD, she became unresponsive, and when checked by a nurse and MD there, was found to be pulseless.  CPR was initiated, and continued for about 10 minutes, followed by ROSC.  An AED was connected during the code, but did not advise shock.  No rhythm strip or EKG was printed.  Following the event, the patient's BP was 165/65, with a pulse of 43.  On arrival to our ED, the patient is hemodynamically stable, with a pulse in the 50's, noting no chest pain, palpitations, shortness of breath, or lightheadedness.  The patient has no memory of the event, but is otherwise alert and oriented.  Of note, the patient was recently discharged 2/28 after a hospitalization for atrial fibrillation, discharged on amiodarone and metoprolol.  The patient's only symptom at present is bilateral "crampy" lower abdominal pain, which she notes has been intermittent over the last 3 months, with intermittent constipation and BRBPR during this time period, most recently today, with no NSAID or alcohol use.  Meds: No current facility-administered medications for this encounter.   Current Outpatient Prescriptions  Medication Sig Dispense Refill  .  amiodarone (PACERONE) 200 MG tablet Take 2 tabs (400 mg) twice daily for 7 days, then take 2 tabs (400 mg) once daily for 3 weeks then take 1 tab (200 mg) once daily thereafter  60 tablet  0  . aspirin 81 MG chewable tablet Chew 1 tablet (81 mg total) by mouth daily.      Marland Kitchen atorvastatin (LIPITOR) 40 MG tablet Take 1 tablet (40 mg total) by mouth daily at 6 PM.  90 tablet  3  . clopidogrel (PLAVIX) 75 MG tablet Take 1 tablet (75 mg total) by mouth daily with breakfast.  90 tablet  3  . insulin NPH-insulin regular (NOVOLIN 70/30) (70-30) 100 UNIT/ML injection Inject 10 Units into the skin daily as needed (when blood sugar is low). As directed per sliding scale      . metoprolol tartrate (LOPRESSOR) 25 MG tablet Take 0.5 tablets (12.5 mg total) by mouth 2 (two) times daily.  60 tablet  4  . multivitamin (RENA-VIT) TABS tablet Take 1 tablet by mouth at bedtime.  90 tablet  3  . nitroGLYCERIN (NITROSTAT) 0.4 MG SL tablet Place 1 tablet (0.4 mg total) under the tongue every 5 (five) minutes x 3 doses as needed for chest pain.  25 tablet  2    Allergies: Allergies as of 10/29/2013 - Review Complete 10/29/2013  Allergen Reaction Noted  . Codeine Rash 01/11/2012  .  Sulfa antibiotics Other (See Comments) 01/11/2012   Past Medical History  Diagnosis Date  . CHF (congestive heart failure)     diast SHF, RV failure, pulm HTN  . Diabetes mellitus   . ESRD on hemodialysis     HD MWF in [place]   . Hyperlipidemia   . Anemia   . Hypertension   . COPD (chronic obstructive pulmonary disease)     not on home oxygen  . Peripheral vascular disease   . Coronary artery disease     Hx CABG. Most recent cath 07/06/13 for CP episode showed occlusive graft disease and moderate AS; pt was not a candidate for repeat surgery and a complex PCI/rotablator/stenting procedure of calcified native RCA was done by cardiology  . Hx of echocardiogram 03/2011    showed a mean gradient that had increased to 41 and a peak  gradient that had increase to 70, she has moderate tricuspid regurigitation, mild to moderate mitral regurigitation, and significant LA dilation.  Marland Kitchen PAF (paroxysmal atrial fibrillation)   . Chronic right-sided heart failure 10/25/2013   Past Surgical History  Procedure Laterality Date  . Coronary artery bypass graft  09/2009    x3 by Dr Alla German with LIMA to the LAD, a vein to the circumflex and a vien to the PDA.  Marland Kitchen Abdominal hysterectomy    . Thoracentesis  11-2009  . Av fistula placement  01-12-2008    left upper arm  . Cardiac catheterization  10/15/2009    which showed low normal LV function with mild inferior hypocontractility.   Family History  Problem Relation Age of Onset  . Heart disease Mother    History   Social History  . Marital Status: Widowed    Spouse Name: N/A    Number of Children: N/A  . Years of Education: N/A   Occupational History  . Not on file.   Social History Main Topics  . Smoking status: Former Smoker -- 0.50 packs/day    Types: Cigarettes    Start date: 06/19/1973  . Smokeless tobacco: Never Used  . Alcohol Use: No  . Drug Use: No  . Sexual Activity: No   Other Topics Concern  . Not on file   Social History Narrative  . No narrative on file    Review of Systems: General: no fevers, chills, changes in weight, changes in appetite Skin: no rash HEENT: no blurry vision, hearing changes, sore throat Pulm: +chronic cough for years, no dyspnea, wheezing CV: no chest pain, palpitations, shortness of breath Abd: See HPI, no nausea, vomiting GU: no dysuria, hematuria, polyuria Ext: no arthralgias, myalgias Neuro: no weakness, numbness, or tingling  Physical Exam: Blood pressure 122/36, pulse 50, temperature 97.8 F (36.6 C), temperature source Oral, resp. rate 18, SpO2 94.00%. General: alert, cooperative, and in no apparent distress HEENT: PERRL, EOMI, oropharynx non-erythematous Neck: supple Lungs: clear to ascultation bilaterally,  normal work of respiration, no wheezes, rales, ronchi Heart: bradycardic, regular rhythm, grade III/VI systolic murmur best heard at RUSB Abdomen: soft, mildly tender to LLQ and RLQ palpation, non-distended, no guarding or rebound tenderness Rectal: several external hemorrhoids noted, internal hemorrhoids palpated, bright red blood on glove after DRE Extremities: 1+ bilateral pitting edema Neurologic: alert & oriented X3, cranial nerves II-XII intact, strength grossly intact, sensation intact to light touch   Lab results: Basic Metabolic Panel:  Recent Labs  16/10/96 1428 10/29/13 0955  NA 132* 138  K 3.6* 3.6*  CL 90* 97  CO2 26  --  GLUCOSE 163* 250*  BUN 13 29*  CREATININE 2.80* 3.60*  CALCIUM 9.0  --   PHOS 4.1  --    Liver Function Tests:  Recent Labs  10/26/13 1428 10/29/13 0944  AST  --  27  ALT  --  20  ALKPHOS  --  157*  BILITOT  --  0.4  PROT  --  7.2  ALBUMIN 2.8* 3.5   CBC:  Recent Labs  10/27/13 0345 10/29/13 0944 10/29/13 0955  WBC 6.9 19.2*  --   NEUTROABS  --  16.8*  --   HGB 9.1* 10.1* 12.2  HCT 28.6* 30.9* 36.0  MCV 95.0 95.4  --   PLT 193 170  --    Cardiac Enzymes: Troponin I: 0.04  CBG:  Recent Labs  10/26/13 1810 10/26/13 2131 10/27/13 0621 10/27/13 1210 10/27/13 1613 10/29/13 0916  GLUCAP 92 266* 157* 127* 219* 227*   Imaging results:  Dg Chest 1 View  10/27/2013   CLINICAL DATA:  Status post right thoracentesis  EXAM: CHEST - 1 VIEW  COMPARISON:  US THORACENTESIS ASP PLEURAL SPACE W/IMG GUIDE dated 10/27/2013  FINDINGS: Status post CABG. Moderate cardiac enlargement. Left lung is clear.  On the right side, there is a small, decreased right pleural effusion status post thoracentesis. There is no pneumothorax.  IMPRESSION: No pneumothorax   Electronically Signed   By: Esperanza Heir M.D.   On: 10/27/2013 12:07   Dg Chest Portable 1 View  10/29/2013   CLINICAL DATA:  Cardiopulmonary arrest. Hypotension. Shortness of breath.   EXAM: PORTABLE CHEST - 1 VIEW  COMPARISON:  10/27/2013  FINDINGS: Prior CABG. Moderately enlarged cardiopericardial silhouette with low lung volumes, small right pleural effusion, and interstitial accentuation. Low lung volumes. No pneumothorax or well-defined rib fracture.  IMPRESSION: 1. Cardiomegaly potentially with mild interstitial edema and a small right pleural effusion.   Electronically Signed   By: Herbie Baltimore M.D.   On: 10/29/2013 09:55   US Thoracentesis Asp Pleural Space W/img Guide  10/27/2013   CLINICAL DATA:  Shortness of breath, right-sided pleural effusion. Request therapeutic thoracentesis.  EXAM: ULTRASOUND GUIDED right THORACENTESIS  COMPARISON:  None.  FINDINGS: A total of approximately 1 L of clear, amber colored fluid was removed. A fluid sample was notsent for laboratory analysis.  IMPRESSION: Successful ultrasound guided right thoracentesis yielding 1 L of pleural fluid.  Read by: Brayton El PA-C  PROCEDURE: An ultrasound guided thoracentesis was thoroughly discussed with the patient and questions answered. The benefits, risks, alternatives and complications were also discussed. The patient understands and wishes to proceed with the procedure. Written consent was obtained.  Ultrasound was performed to localize and mark an adequate pocket of fluid in the right chest. The area was then prepped and draped in the normal sterile fashion. 1% Lidocaine was used for local anesthesia. Under ultrasound guidance a 19 gauge Yueh catheter was introduced. Thoracentesis was performed. The catheter was removed and a dressing applied.  Complications:  None immediate   Electronically Signed   By: Simonne Come M.D.   On: 10/27/2013 11:45   Cardiac Cath 07/11/13 Summary of findings: -RCA: 60% proximal stenosis, 90% mid-vessel stenosis, 99% acute margin stenosis, severe diffuse calcification -low-level balloon dilatation at all sites, with tandem stenting with 3.0x38 mm DES stents postdilated to  3.5 mm with the focal mid segment, there was marked improvement in vessel size with most sites being reduced to 0%.  -There was still mild residual waist the mid level  which was severely calcified but there did appear to be excellent stent apposition. This site was postdilated to 3.89 mm.  IMPRESSION:  Difficult but successful complex percutaneous coronary intervention of a severely calcified diffusely diseased right coronary artery with temporary pacemaker insertion, High-Speed Rotational Atherectomy, Cutting Balloon atherotomy, PTCA, and tandem stenting with two 3.0x38 mm DES stents with all sites being postdilated to 3.5 mm but with a residual mid RCA site with significant residual extrinsic calcium being postdilated to 3.89 mm.   Other results: EKG: sinus Kne[place]ley, TWI in V1-V3 seen on prior, reversal of inverted T waves in V4-V6 compared to 2/24, no ST abnormalities  Assessment & Plan by Problem: The patient is a 73 yo woman, history of pAfib, CAD, recently discharged after an admission for afib, presenting after cardiac arrest.  # Cardiac Arrest - reported by HD center, though no rhythm strips obtained at the time.  This most likely represents bradycardic arrest (HR 43 after ROSC) vs arrythmia (history of afib, CAD, prolonged QT).  Less likely could represent syncope from symptomatic Aortic Stenosis given volume changes with HD.  No current chest pain to suggest acute ischemia as an inciting factor, trop neg x1, EKG with no significant changes. -admit to stepdown, with telemetry monitoring -cardiology consulted, appreciate assistance with this unclear case -continue amiodarone per taper initiated during last hospitalization (currently 400 mg BID) -hold metoprolol -cycle troponins x3 -check TSH, given amiodarone usage and HR fluctuations (recently afib with RVR, now Harvie)  # Bright red blood per rectum - intermittent for the last 3 months per pt report.  The patient reports her last  colonoscopy was with Dr. Jennye BoroughsMisenheimer, GI, 1-2 years ago, with polyps removed.  This most likely represents hemorrhoidal bleeding (multiple hemorrhoids present on DRE) vs diverticulosis.  Hb currently stable at 10.1 (baseline 9-10). -trend CBC's daily -colace for constipation -likely no need for GI consult at this time, as Hb stable, but can consider if Hb drops  # QT prolongation - seen on prior EKG's, though mildly higher today compared to prior (QTc = 549 from 504).  Etiology includes amiodarone usage vs electrolyte imbalance vs thyroid abnormality (though no history) -check Mg, TSH  # ESRD - [facility].  Only received about 1 hour of her scheduled treatment 3/2.  No indication for urgent HD based on labs today. -consulted Renal for routine HD -daily weights -renal function panel in AM  # CAD - s/p CABG x3 in 2011 (LIMA to LAD, vein to circumflex, vein to PDA), s/p cath Nov 2014 (DES to RCA). -continue aspirin, plavix, statin  # DM2 - on home Novolog 70/30 on a sliding scale, pt unclear on dosing. -start SSI while inpatient  # Paroxysmal Afib - recently discharged 2/28 after an admission for afib with RVR, discharged on amiodarone taper and metoprolol.  Currently in sinus bradycardia. ?need for pacer -continue amiodarone -hold metoprolol  # Severe Aortic Stenosis - Echo 10/06/13 showing EF 60-65%, but aortic valve area 0.45. -monitor volume status  Dispo: Disposition is deferred at this time, awaiting improvement of current medical problems. Anticipated discharge in approximately 2-3 day(s).   The patient does have a current PCP (Imran Clarnce FlockP Haque, MD) and does not [facility] after discharge.  Signed: Linward Headlandyan K Aundray Cartlidge, MD 10/29/2013, 11:26 AM

## 2013-10-29 NOTE — Consult Note (Signed)
CARDIOLOGY CONSULT NOTE   Patient ID: [name] MRN: [id number] DOB/AGE: Jan 27, 1941 73 y.o.  Admit date: 10/29/2013  Primary Physician   Galvin Proffer, MD Primary Cardiologist   TK Reason for Consultation   Cardiac arrest  NWG:[name] is a 73 y.o. female with a history of COPD, CAD s/p CABG X 3 in 2011 with an LIMA to LAD and vein grafts to the PDA and circumflex. She was hospitalized 02/23-->02/28 with PAF, pleural effusion, thoracentesis on 2/28 with 1 L removed. During that admission, she had repeated problems with atrial tachycardia versus atypical atrial flutter during hemodialysis. She had been previously started on amiodarone, dosage 200 mg twice a day on admission. She received IV amiodarone for about 24 hours and was discharged on 400 mg twice a day. She was also on Lopressor 12.5 mg twice a day.   She had felt well and went to dialysis today at her usual time. While on dialysis, she was noted to be unresponsive. She was found to be pulseless and apneic. CPR was initiated and an AED was applied. She required approximately 10 minutes of CPR. She did not require shock and did not get any medications. After she regained pulses, she was conscious and alert. She had no complaints of chest pain or shortness of breath. She has not had any palpitations. The AED was interrogated afterwards and showed an idioventricular rhythm, strips not available. EMS was summoned when CPR was initiated and transported her to the emergency room where she was admitted. Currently she feels tired and sleepy. Her heart rate is in the high 40s, low 50s and his sinus bradycardia. She denies any presyncope or syncope. She has not had palpitations since discharge. This is her first dialysis appointment since she was discharged.    Past Medical History  Diagnosis Date  . CHF (congestive heart failure)     diast SHF, RV failure, pulm HTN  . Diabetes mellitus   . ESRD on hemodialysis     HD MWF in  [place]   . Hyperlipidemia   . Anemia   . Hypertension   . COPD (chronic obstructive pulmonary disease)     not on home oxygen  . Peripheral vascular disease   . Coronary artery disease     Hx CABG. Most recent cath 07/06/13 for CP episode showed occlusive graft disease and moderate AS; pt was not a candidate for repeat surgery and a complex PCI/rotablator/stenting procedure of calcified native RCA was done by cardiology  . Hx of echocardiogram 03/2011    showed a mean gradient that had increased to 41 and a peak gradient that had increase to 70, she has moderate tricuspid regurigitation, mild to moderate mitral regurigitation, and significant LA dilation.  Marland Kitchen PAF (paroxysmal atrial fibrillation)   . Chronic right-sided heart failure 10/25/2013     Past Surgical History  Procedure Laterality Date  . Coronary artery bypass graft  09/2009    x3 by Dr Alla German with LIMA to the LAD, a vein to the circumflex and a vien to the PDA.  Marland Kitchen Abdominal hysterectomy    . Thoracentesis  11-2009  . Av fistula placement  01-12-2008    left upper arm  . Cardiac catheterization  10/15/2009    which showed low normal LV function with mild inferior hypocontractility.    Allergies  Allergen Reactions  . Codeine Rash  . Sulfa Antibiotics Other (See Comments)    unknown    I  have reviewed the patient's current medications . amiodarone  400 mg Oral BID  . [START ON 10/30/2013] aspirin  81 mg Oral Daily  . atorvastatin  40 mg Oral q1800  . [START ON 10/30/2013] clopidogrel  75 mg Oral Q breakfast  . [START ON 10/30/2013] darbepoetin (ARANESP) injection - DIALYSIS  12.5 mcg Intravenous To Hemo  . [START ON 11/05/2013] darbepoetin (ARANESP) injection - DIALYSIS  12.5 mcg Intravenous Q Mon-HD  . [START ON 10/30/2013] docusate sodium  100 mg Oral Daily  . [START ON 10/31/2013] doxercalciferol  2 mcg Intravenous Q M,W,F-HD  . heparin  5,000 Units Subcutaneous 3 times per day  . insulin aspart  0-9 Units Subcutaneous  TID WC  . sodium chloride  3 mL Intravenous Q12H     acetaminophen, acetaminophen  Prior to Admission medications   Medication Sig Start Date End Date Taking? Authorizing Provider  amiodarone (PACERONE) 200 MG tablet Take 2 tabs (400 mg) twice daily for 7 days, then take 2 tabs (400 mg) once daily for 3 weeks then take 1 tab (200 mg) once daily thereafter 10/27/13  Yes Cassie O Edmisten, PA-C  aspirin 81 MG chewable tablet Chew 1 tablet (81 mg total) by mouth daily. 07/13/13  Yes Cassie Derrick, PA-C  atorvastatin (LIPITOR) 40 MG tablet Take 1 tablet (40 mg total) by mouth daily at 6 PM. 07/13/13  Yes Cassie Derrick, PA-C  clopidogrel (PLAVIX) 75 MG tablet Take 1 tablet (75 mg total) by mouth daily with breakfast. 07/13/13  Yes Cassie Paschal Kilroy, PA-C  insulin NPH-insulin regular (NOVOLIN 70/30) (70-30) 100 UNIT/ML injection Inject 10 Units into the skin daily as needed (when blood sugar is low). As directed per sliding scale   Yes Historical Provider, MD  metoprolol tartrate (LOPRESSOR) 25 MG tablet Take 0.5 tablets (12.5 mg total) by mouth 2 (two) times daily. 10/27/13  Yes Cassie O Edmisten, PA-C  multivitamin (RENA-VIT) TABS tablet Take 1 tablet by mouth at bedtime. 07/13/13  Yes Cassie K Kilroy, PA-C  nitroGLYCERIN (NITROSTAT) 0.4 MG SL tablet Place 1 tablet (0.4 mg total) under the tongue every 5 (five) minutes x 3 doses as needed for chest pain. 07/13/13   Cassie Derrick, PA-C     History   Social History  . Marital Status: Widowed    Spouse Name: N/A    Number of Children: N/A  . Years of Education: N/A   Occupational History  . Retired    Social History Main Topics  . Smoking status: Former Smoker -- 0.50 packs/day    Types: Cigarettes    Start date: 06/19/1973  . Smokeless tobacco: Never Used  . Alcohol Use: No  . Drug Use: No  . Sexual Activity: No   Other Topics Concern  . Not on file   Social History Narrative   The patient currently lives at home with a friend "[name]", who  helps her around the house.  The patient manages her own medications, using a pill box.    Family Status  Relation Status Death Age  . Mother Alive     Born approx [phone]  . Father Deceased    Family History  Problem Relation Age of Onset  . Heart disease Mother      ROS:  Full 14 point review of systems complete and found to be negative unless listed above.  Physical Exam: Blood pressure 105/60, pulse 40, temperature 98 F (36.7 C), temperature source Oral, resp. rate 15, height 5\' 4"  (  1.626 m), weight 119 lb 4.3 oz (54.1 kg), SpO2 100.00%.  General: Well developed, well nourished, female in no acute distress Head: Eyes PERRLA, No xanthomas.   Normocephalic and atraumatic, oropharynx without edema or exudate. Dentition: poor Lungs: rales bases Heart: HRRR S1 S2, no rub/gallop, 3/6 murmur. pulses are 2+ RUE, 1+ bilat LLE, HD access LUE    Neck: No carotid bruits. No lymphadenopathy.  JVD at 8 cm. Abdomen: Bowel sounds present, abdomen soft and non-tender without masses or hernias noted. Msk:  No spine or cva tenderness. Generalized weakness, no joint deformities or effusions. Extremities: No clubbing or cyanosis. no edema.  Neuro: Alert and oriented X 3. No focal deficits noted. Psych:  Good affect, responds appropriately Skin: No rashes or lesions noted.  Labs:   Lab Results  Component Value Date   WBC 19.2* 10/29/2013   HGB 12.2 10/29/2013   HCT 36.0 10/29/2013   MCV 95.4 10/29/2013   PLT 170 10/29/2013     Recent Labs Lab 10/26/13 1428 10/29/13 0944 10/29/13 0955  NA 132*  --  138  K 3.6*  --  3.6*  CL 90*  --  97  CO2 26  --   --   BUN 13  --  29*  CREATININE 2.80*  --  3.60*  CALCIUM 9.0  --   --   PROT  --  7.2  --   BILITOT  --  0.4  --   ALKPHOS  --  157*  --   ALT  --  20  --   AST  --  27  --   GLUCOSE 163*  --  250*  ALBUMIN 2.8* 3.5  --     Recent Labs  10/29/13 0953  TROPIPOC 0.04    TSH  Date/Time Value Ref Range Status  07/04/2013  4:20 PM 1.251   0.350 - 4.500 uIU/mL Final     Performed at [facility]   Echo: 10/06/2013 Conclusions - Left ventricle: The cavity size was normal. There was moderate concentric hypertrophy. Systolic function was normal. The estimated ejection fraction was in the range of 60% to 65%. Wall motion was normal; there were no regional wall motion abnormalities. Doppler parameters are consistent with restrictive physiology, indicative of decreased left ventricular diastolic compliance and/or increased left atrial pressure. - Aortic valve: There was moderate to severe stenosis. Valve area: 0.45cm^2(VTI). Valve area: 0.45cm^2 (Vmax). - Mitral valve: Calcified annulus. Mildly thickened, mildly calcified leaflets . Mild regurgitation directed centrally. Valve area by continuity equation (using LVOT flow): 0.91cm^2. - Left atrium: The atrium was mildly dilated. - Right ventricle: The cavity size was mildly dilated. Systolic function was moderately to severely reduced. - Right atrium: The atrium was mildly dilated. - Tricuspid valve: Moderate-severe regurgitation directed centrally. The acceleration rate of the regurgitant jet was reduced, consistent with a low dP/dt. - Pulmonary arteries: PA peak pressure: 52mm Hg (S).  ECG:  S Cleverly  Radiology:  Dg Chest Portable 1 View 10/29/2013   CLINICAL DATA:  Cardiopulmonary arrest. Hypotension. Shortness of breath.  EXAM: PORTABLE CHEST - 1 VIEW  COMPARISON:  10/27/2013  FINDINGS: Prior CABG. Moderately enlarged cardiopericardial silhouette with low lung volumes, small right pleural effusion, and interstitial accentuation. Low lung volumes. No pneumothorax or well-defined rib fracture.  IMPRESSION: 1. Cardiomegaly potentially with mild interstitial edema and a small right pleural effusion.   Electronically Signed   By: Herbie BaltimoreWalt  Liebkemann M.D.   On: 10/29/2013 09:55    ASSESSMENT AND  PLAN:   The patient was seen today by Dr. Eldridge Dace, the patient evaluated and  the data reviewed.  Principal Problem:   Cardiac arrest - bradycardic, idioventricular escape rhythm seen on AED interrogation. 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 Have decreased amio down to 200 mg once a day and pt is off BB.  Otherwise, per primary MD and Renal teams. Active Problems:   End stage renal disease   DM (diabetes mellitus)   Aortic stenosis, moderate at cath 07/11/13 - see recent echo report above   Atrial fibrillation - PAF versus atrial flutter or atrial tachycardia with rapid ventricular response. Previously not deemed a candidate for anticoagulation due to fall risk   Sinus bradycardia   Signed: Theodore Demark, PA-C 10/29/2013 2:56 PM Beeper [phone]  Co-Sign MD I have examined the patient and reviewed assessment and plan and discussed with patient.  Agree with above as stated.  Tachy Badeaux syndrome.  AFib.  Idioventricular rhythm noted on AED per report.  Will consult EP to see if pacer would be indicated.  Decreased amiodarone to 200 mg daily.  SHe has had a load over the last week of more than 5 grams.  Eastyn Dattilo S.

## 2013-10-29 NOTE — ED Notes (Signed)
Results of lactic acid given to Dr.  Meyer CoryJ. Arrieta

## 2013-10-29 NOTE — ED Notes (Signed)
IV team paged to de access dialysis site

## 2013-10-29 NOTE — ED Notes (Signed)
IV team at bedside de accessing dialysis site

## 2013-10-29 NOTE — ED Notes (Signed)
Admitting physician at bedside

## 2013-10-30 DIAGNOSIS — N186 End stage renal disease: Secondary | ICD-10-CM

## 2013-10-30 LAB — GLUCOSE, CAPILLARY
GLUCOSE-CAPILLARY: 166 mg/dL — AB (ref 70–99)
GLUCOSE-CAPILLARY: 108 mg/dL — AB (ref 70–99)
Glucose-Capillary: 75 mg/dL (ref 70–99)
Glucose-Capillary: 148 mg/dL — ABNORMAL HIGH (ref 70–99)

## 2013-10-30 LAB — RENAL FUNCTION PANEL
ALBUMIN: 2.8 g/dL — AB (ref 3.5–5.2)
BUN: 33 mg/dL — AB (ref 6–23)
CHLORIDE: 99 meq/L (ref 96–112)
CO2: 26 mEq/L (ref 19–32)
Calcium: 9.3 mg/dL (ref 8.4–10.5)
Creatinine, Ser: 4.64 mg/dL — ABNORMAL HIGH (ref 0.50–1.10)
GFR calc Af Amer: 10 mL/min — ABNORMAL LOW (ref >90)
GFR calc non Af Amer: 9 mL/min — ABNORMAL LOW (ref >90)
Glucose, Bld: 90 mg/dL (ref 70–99)
Phosphorus: 5.5 mg/dL — ABNORMAL HIGH (ref 2.3–4.6)
Potassium: 3.6 mEq/L — ABNORMAL LOW (ref 3.7–5.3)
Sodium: 142 mEq/L (ref 137–147)

## 2013-10-30 LAB — CBC
HEMATOCRIT: 27.1 % — AB (ref 36.0–46.0)
HEMOGLOBIN: 9 g/dL — AB (ref 12.0–15.0)
MCH: 31.1 pg (ref 26.0–34.0)
MCHC: 33.2 g/dL (ref 30.0–36.0)
MCV: 93.8 fL (ref 78.0–100.0)
Platelets: 184 10*3/uL (ref 150–400)
RBC: 2.89 MIL/uL — ABNORMAL LOW (ref 3.87–5.11)
RDW: 16.1 % — AB (ref 11.5–15.5)
WBC: 12.8 10*3/uL — AB (ref 4.0–10.5)

## 2013-10-30 LAB — CLOSTRIDIUM DIFFICILE BY PCR: Toxigenic C. Difficile by PCR: POSITIVE — AB

## 2013-10-30 LAB — TSH: TSH: 1.71 u[IU]/mL (ref 0.350–4.500)

## 2013-10-30 MED ORDER — SODIUM CHLORIDE 0.9 % IV BOLUS (SEPSIS)
250.0000 mL | Freq: Once | INTRAVENOUS | Status: AC
  Administered 2013-10-30: 250 mL via INTRAVENOUS

## 2013-10-30 MED ORDER — DARBEPOETIN ALFA-POLYSORBATE 25 MCG/0.42ML IJ SOLN
12.5000 ug | INTRAMUSCULAR | Status: DC
  Administered 2013-10-31: 12.5 ug via INTRAVENOUS
  Filled 2013-10-30: qty 0.42

## 2013-10-30 MED ORDER — METRONIDAZOLE 500 MG PO TABS
500.0000 mg | ORAL_TABLET | Freq: Three times a day (TID) | ORAL | Status: DC
  Administered 2013-10-30 – 2013-10-31 (×2): 500 mg via ORAL
  Filled 2013-10-30 (×5): qty 1

## 2013-10-30 NOTE — Progress Notes (Signed)
Utilization review completed.  

## 2013-10-30 NOTE — Progress Notes (Signed)
Subjective:  Patient had borderline hypotension overnight. Shereceived 2x 250cc bolus of NS after systolic pressures dropped below 100.  Ms. [name] states she is feeling well this morning, and has no specific concerns or complaints.   Objective: Vital signs in last 24 hours: Filed Vitals:   10/30/13 0000 10/30/13 0343 10/30/13 0400 10/30/13 0710  BP:  119/38  113/39  Pulse: 52 49 51 49  Temp:  97.5 F (36.4 C)  98.7 F (37.1 C)  TempSrc:  Oral  Oral  Resp: 15 18 18 18   Height:  5\' 4"  (1.626 m)    Weight:  119 lb (53.978 kg)    SpO2: 100% 98% 99% 100%   Weight change:   Intake/Output Summary (Last 24 hours) at 10/30/13 1106 Last data filed at 10/30/13 0100  Gross per 24 hour  Intake    503 ml  Output      3 ml  Net    500 ml   Physical Exam  Constitutional: She is oriented to person, place, and time. She appears well-developed and well-nourished. No distress.  HENT:  Head: Normocephalic.  Mouth/Throat: Oropharynx is clear and moist. No oropharyngeal exudate.  Cardiovascular: Normal rate, regular rhythm and intact distal pulses.  Exam reveals no gallop and no friction rub.   Murmur heard. 4/6 systolic crescendo decrescendo murmur in LUSB that radiates to carotids.  Pulmonary/Chest: Effort normal and breath sounds normal. No respiratory distress. She has no wheezes. She has no rales.  Abdominal: Soft. Bowel sounds are normal. She exhibits no distension. There is no tenderness.  Musculoskeletal: She exhibits no edema and no tenderness.  Good bruit in left AVF  Neurological: She is alert and oriented to person, place, and time.  Skin: She is not diaphoretic.  Psychiatric: She has a normal mood and affect. Her behavior is normal.    Lab Results: Basic Metabolic Panel:  Recent Labs Lab 10/26/13 1428 10/29/13 0955 10/29/13 1635 10/30/13 0645  NA 132* 138  --  142  K 3.6* 3.6*  --  3.6*  CL 90* 97  --  99  CO2 26  --   --  26  GLUCOSE 163* 250*  --  90  BUN 13 29*   --  33*  CREATININE 2.80* 3.60*  --  4.64*  CALCIUM 9.0  --   --  9.3  MG  --   --  2.0  --   PHOS 4.1  --   --  5.5*   Liver Function Tests:  Recent Labs Lab 10/29/13 0944 10/30/13 0645  AST 27  --   ALT 20  --   ALKPHOS 157*  --   BILITOT 0.4  --   PROT 7.2  --   ALBUMIN 3.5 2.8*   CBC:  Recent Labs Lab 10/29/13 0944 10/29/13 0955 10/30/13 0645  WBC 19.2*  --  12.8*  NEUTROABS 16.8*  --   --   HGB 10.1* 12.2 9.0*  HCT 30.9* 36.0 27.1*  MCV 95.4  --  93.8  PLT 170  --  184   Cardiac Enzymes:  Recent Labs Lab 10/23/13 1115 10/29/13 1635 10/29/13 2145  TROPONINI <0.30 <0.30 <0.30   CBG:  Recent Labs Lab 10/27/13 1210 10/27/13 1613 10/29/13 0916 10/29/13 1621 10/29/13 2119 10/30/13 0828  GLUCAP 127* 219* 227* 229* 145* 75   Thyroid Function Tests:  Recent Labs Lab 10/29/13 1635  TSH 1.710    Micro Results: Recent Results (from the past 240 hour(s))  MRSA PCR SCREENING     Status: None   Collection Time    10/29/13  1:59 PM      Result Value Ref Range Status   MRSA by PCR NEGATIVE  NEGATIVE Final   Comment:            The GeneXpert MRSA Assay (FDA     approved for NASAL specimens     only), is one component of a     comprehensive MRSA colonization     surveillance program. It is not     intended to diagnose MRSA     infection nor to guide or     monitor treatment for     MRSA infections.   Studies/Results: Dg Chest Portable 1 View  10/29/2013   CLINICAL DATA:  Cardiopulmonary arrest. Hypotension. Shortness of breath.  EXAM: PORTABLE CHEST - 1 VIEW  COMPARISON:  10/27/2013  FINDINGS: Prior CABG. Moderately enlarged cardiopericardial silhouette with low lung volumes, small right pleural effusion, and interstitial accentuation. Low lung volumes. No pneumothorax or well-defined rib fracture.  IMPRESSION: 1. Cardiomegaly potentially with mild interstitial edema and a small right pleural effusion.   Electronically Signed   By: Herbie Baltimore  M.D.   On: 10/29/2013 09:55   Medications: I have reviewed the patient's current medications. Scheduled Meds: . amiodarone  200 mg Oral Daily  . aspirin  81 mg Oral Daily  . atorvastatin  40 mg Oral q1800  . clopidogrel  75 mg Oral Q breakfast  . darbepoetin (ARANESP) injection - DIALYSIS  12.5 mcg Intravenous To Hemo  . [START ON 11/05/2013] darbepoetin (ARANESP) injection - DIALYSIS  12.5 mcg Intravenous Q Mon-HD  . docusate sodium  100 mg Oral Daily  . [START ON 10/31/2013] doxercalciferol  2 mcg Intravenous Q M,W,F-HD  . heparin  5,000 Units Subcutaneous 3 times per day  . insulin aspart  0-9 Units Subcutaneous TID WC  . sodium chloride  3 mL Intravenous Q12H   Continuous Infusions:  PRN Meds:.acetaminophen, acetaminophen Assessment/Plan: Principal Problem:   Cardiac arrest Active Problems:   End stage renal disease   DM (diabetes mellitus)   HTN (hypertension)   Aortic stenosis, moderate at cath 07/11/13   PAF (paroxysmal atrial fibrillation)   Atrial fibrillation   Sinus bradycardia   Aortic stenosis  Cardiac Arrest - reported by HD center. This may represent bradycardic arrest (HR in the 40s) vs arrythmia (history of Afib, CAD, prolonged QT) vs syncope from symptomatic aortic stenosis given volume changes with HD. 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 No current chest pain to suggest acute ischemia as an inciting factor, trop neg x3, EKG with no significant changes. TSH normal.  -admit to stepdown, with telemetry monitoring  -cardiology consulted, appreciate rec's -hold metoprolol  -amiodarone decreased 400mg  BID -> 200mg  BID  -cardiology to consult EP, evaluate for possible pacemaker implantation   ESRD - on MWF HD at [facility] dialysis center. Only received about 1 hour of her scheduled treatment 3/2. Per nephrology, pt does not require HD today given good labs and hypotension overnight. Today, K is unchanged at 3.6 but patient's  creatinine is 4.6 < baseline of 3.6  -daily weights  -pt to receive HD tomorrow, resuming MWF schedule (10/31/13)  -renal function panel in AM   Bright red blood per rectum - intermittent for the last 3 months per pt report. The patient reports her last colonoscopy was with Dr. Jennye Boroughs, GI, 1-2 years ago, with polyps removed. This  most likely represents hemorrhoidal bleeding (multiple hemorrhoids present on DRE). Hb currently stable at 10.1 (baseline 9-10).  -trend CBC's daily  -colace for constipation  -likely no need for GI consult at this time, as Hb stable, but can consider if Hb drops   QT prolongation - seen on prior EKG's, though mildly higher today compared to prior (QTc = 549 from 504). Etiology includes amiodarone usage vs electrolyte imbalance vs thyroid abnormality (though no history)  -Mg = 2.0, TSH = 1.71  CAD - s/p CABG x3 in 2011 (LIMA to LAD, vein to circumflex, vein to PDA), s/p cath Nov 2014 (DES to RCA).  -continue aspirin, plavix, statin   DM2 - on home Novolog 70/30 on a sliding scale, pt unclear on dosing.  -Continue inpatient SSI protocol  Paroxysmal Afib - recently discharged 2/28 after an admission for afib with RVR, discharged on amiodarone taper and metoprolol. Currently in sinus bradycardia. See plan as above. -continue amiodarone  -hold metoprolol   Severe Aortic Stenosis - Echo 10/06/13 showing EF 60-65%, but aortic valve area 0.45.  -monitor volume status  - Maintain BP as AS requires afterload for cardiac perfusion   Dispo: Disposition is deferred at this time, awaiting improvement of current medical problems.  Anticipated discharge in approximately 2 day(s).   The patient does have a current PCP (Imran Clarnce FlockP Haque, MD) and [facility] after discharge.  The patient does have transportation limitations that hinder transportation to clinic appointments.  .Services Needed at time of discharge: Y = Yes, Blank = No PT:    OT:   RN:   Equipment:   Other:     LOS: 1 day   Pleas Kochhristopher Clemon Devaul, MD 10/30/2013, 11:06 AM

## 2013-10-30 NOTE — Consult Note (Signed)
ELECTROPHYSIOLOGY CONSULT NOTE    Patient ID: [name] MRN: [id number], DOB/AGE: Mar 16, 1941 73 y.o.  Admit date: 10/29/2013 Date of Consult: 10-30-2013  Primary Physician: Galvin Proffer, MD Primary Cardiologist: Tresa Endo  Reason for Consultation: PEA arrest  HPI:  [name] is a 73 y.o. female with a history of COPD, CAD s/p CABG X 3 in 2011 with an LIMA to LAD and vein grafts to the PDA and circumflex. She was hospitalized 02/23-->02/28 with PAF, pleural effusion, thoracentesis on 2/28 with 1 L removed. During that admission, she had repeated problems with atrial tachycardia versus atypical atrial flutter during hemodialysis. She had been previously started on amiodarone, dosage 200 mg twice a day on admission. She received IV amiodarone for about 24 hours and was discharged on 400 mg twice a day. She was also on Lopressor 12.5 mg twice a day.   Echocardiogram 10-06-13 demonstrated EF 60-65%, no RWMA, moderate to severe AS, mild MR, moderate to severe TR, PA pressure 52, LA 43.  She was evaluated by [facility] 07-04-13 who did not feel she was a surgical candidate due to severe COPD, porcelain aorta and dialysis.   On Sunday night and Monday morning, she states that she did not feel right.  She is unable to articulate further what this means. While on dialysis, she was noted to be unresponsive. She was found to be pulseless and apneic. CPR was initiated and an AED was applied. She required approximately 10 minutes of CPR. She did not require shock and did not get any medications. After she regained pulses, she was conscious and alert. She had no complaints of chest pain or shortness of breath. She has not had any palpitations. The AED was interrogated afterwards and showed an idioventricular rhythm per Dr Eliott Nine, strips not available. EMS was summoned when CPR was initiated and transported her to the emergency room where she was admitted.   Since admission, she has had no atrial arrhythmias.  Her  sinus rates have been in the 50's.   She denies chest pain, shortness of breath, fevers, or chills.  She does state that she has been having diarrhea since discharge 2/28.  ROS is otherwise negative.  She lives at home alone but has friends come in to help care for her.   EP has been asked to evaluate for treatment options.   Past Medical History  Diagnosis Date  . CHF (congestive heart failure)     diast SHF, RV failure, pulm HTN  . Diabetes mellitus   . ESRD on hemodialysis     HD MWF in [place]   . Hyperlipidemia   . Anemia   . Hypertension   . COPD (chronic obstructive pulmonary disease)     not on home oxygen  . Peripheral vascular disease   . Coronary artery disease     Hx CABG. Most recent cath 07/06/13 for CP episode showed occlusive graft disease and moderate AS; pt was not a candidate for repeat surgery and a complex PCI/rotablator/stenting procedure of calcified native RCA was done by cardiology  . Hx of echocardiogram 03/2011    showed a mean gradient that had increased to 41 and a peak gradient that had increase to 70, she has moderate tricuspid regurigitation, mild to moderate mitral regurigitation, and significant LA dilation.  Marland Kitchen PAF (paroxysmal atrial fibrillation)   . Chronic right-sided heart failure 10/25/2013  . Aortic stenosis      Surgical History:  Past Surgical History  Procedure Laterality  Date  . Coronary artery bypass graft  09/2009    x3 by Dr Alla GermanVanTright with LIMA to the LAD, a vein to the circumflex and a vien to the PDA.  Marland Kitchen. Abdominal hysterectomy    . Thoracentesis  11-2009  . Av fistula placement  01-12-2008    left upper arm  . Cardiac catheterization  10/15/2009    which showed low normal LV function with mild inferior hypocontractility.     Prescriptions prior to admission  Medication Sig Dispense Refill  . amiodarone (PACERONE) 200 MG tablet Take 2 tabs (400 mg) twice daily for 7 days, then take 2 tabs (400 mg) once daily for 3 weeks then  take 1 tab (200 mg) once daily thereafter  60 tablet  0  . aspirin 81 MG chewable tablet Chew 1 tablet (81 mg total) by mouth daily.      Marland Kitchen. atorvastatin (LIPITOR) 40 MG tablet Take 1 tablet (40 mg total) by mouth daily at 6 PM.  90 tablet  3  . clopidogrel (PLAVIX) 75 MG tablet Take 1 tablet (75 mg total) by mouth daily with breakfast.  90 tablet  3  . insulin NPH-insulin regular (NOVOLIN 70/30) (70-30) 100 UNIT/ML injection Inject 10 Units into the skin daily as needed (when blood sugar is low). As directed per sliding scale      . metoprolol tartrate (LOPRESSOR) 25 MG tablet Take 0.5 tablets (12.5 mg total) by mouth 2 (two) times daily.  60 tablet  4  . multivitamin (RENA-VIT) TABS tablet Take 1 tablet by mouth at bedtime.  90 tablet  3  . nitroGLYCERIN (NITROSTAT) 0.4 MG SL tablet Place 1 tablet (0.4 mg total) under the tongue every 5 (five) minutes x 3 doses as needed for chest pain.  25 tablet  2    Inpatient Medications:  . amiodarone  200 mg Oral Daily  . aspirin  81 mg Oral Daily  . atorvastatin  40 mg Oral q1800  . clopidogrel  75 mg Oral Q breakfast  . darbepoetin (ARANESP) injection - DIALYSIS  12.5 mcg Intravenous To Hemo  . [START ON 11/05/2013] darbepoetin (ARANESP) injection - DIALYSIS  12.5 mcg Intravenous Q Mon-HD  . docusate sodium  100 mg Oral Daily  . [START ON 10/31/2013] doxercalciferol  2 mcg Intravenous Q M,W,F-HD  . heparin  5,000 Units Subcutaneous 3 times per day  . insulin aspart  0-9 Units Subcutaneous TID WC  . sodium chloride  3 mL Intravenous Q12H    Allergies:  Allergies  Allergen Reactions  . Codeine Rash  . Sulfa Antibiotics Other (See Comments)    unknown    History   Social History  . Marital Status: Widowed    Spouse Name: N/A    Number of Children: N/A  . Years of Education: N/A   Occupational History  . Retired    Social History Main Topics  . Smoking status: Former Smoker -- 0.50 packs/day    Types: Cigarettes    Start date:  06/19/1973  . Smokeless tobacco: Never Used  . Alcohol Use: No  . Drug Use: No  . Sexual Activity: No   Other Topics Concern  . Not on file   Social History Narrative   The patient currently lives at home with a friend "Trinna Postlex", who helps her around the house.  The patient manages her own medications, using a pill box.     Family History  Problem Relation Age of Onset  . Heart disease Mother  Physical Exam: Filed Vitals:   10/30/13 0000 10/30/13 0343 10/30/13 0400 10/30/13 0710  BP:  119/38  113/39  Pulse: 52 49 51 49  Temp:  97.5 F (36.4 C)  98.7 F (37.1 C)  TempSrc:  Oral  Oral  Resp: 15 18 18 18   Height:  5\' 4"  (1.626 m)    Weight:  119 lb (53.978 kg)    SpO2: 100% 98% 99% 100%    GEN- The patient is chronically ill and elderly appearing, alert Head- normocephalic, atraumatic Eyes-  Sclera clear, conjunctiva pink Ears- hearing intact Oropharynx- clear Neck- supple,   Lungs- ferw basilar rales, normal work of breathing Heart- Regular rate and rhythm, 2/6 SEM LUSB (late peaking) GI- soft, NT, ND, + BS Extremities- no clubbing, cyanosis, or edema MS- diffuse muscle atrophy Skin- several nonhealing sores on her legs Psych- euthymic mood, full affect Neuro- strength and sensation are intact  Labs:   Lab Results  Component Value Date   WBC 12.8* 10/30/2013   HGB 9.0* 10/30/2013   HCT 27.1* 10/30/2013   MCV 93.8 10/30/2013   PLT 184 10/30/2013    Recent Labs Lab 10/29/13 0944  10/30/13 0645  NA  --   < > 142  K  --   < > 3.6*  CL  --   < > 99  CO2  --   --  26  BUN  --   < > 33*  CREATININE  --   < > 4.64*  CALCIUM  --   --  9.3  PROT 7.2  --   --   BILITOT 0.4  --   --   ALKPHOS 157*  --   --   ALT 20  --   --   AST 27  --   --   GLUCOSE  --   < > 90  < > = values in this interval not displayed.   Radiology/Studies: Dg Chest 1 View 10/27/2013   CLINICAL DATA:  Status post right thoracentesis  EXAM: CHEST - 1 VIEW  COMPARISON:  US THORACENTESIS ASP  PLEURAL SPACE W/IMG GUIDE dated 10/27/2013  FINDINGS: Status post CABG. Moderate cardiac enlargement. Left lung is clear.  On the right side, there is a small, decreased right pleural effusion status post thoracentesis. There is no pneumothorax.  IMPRESSION: No pneumothorax   Electronically Signed   By: Esperanza Heir M.D.   On: 10/27/2013 12:07   EAV:WUJWJ Fick, rate 51, RBBB, LAFB  TELEMETRY: sinus Jansma, rates 50's  Assessment and Plan:  The patient presents following PEA arrest.  On telemetry here, she has sinus rhythm 50s.  She did not require defibrillation with her recent event.  I share Dr Gwenlyn Perking suspicion that in the setting of diarrhea that hypotension/ decreased preload with dialysis and severe AS are likely the cause for her event.  I think that a primary bradycardic event is less likely.  Given her multiple advanced medical problems, her prognosis is poor.  She is not a candidate for any EP procedures at this time. She would like CPR/defib if necessary but would not like intubation in the future.  I think that palliative consultation to discuss goals of care could be beneficial.  Continue amiodarone 200mg  daily.  Hold metoprolol.  Treatment of diarrhea per primary team.  Caution to avoid reducing preload.  General cardiology to follow along. Ep to see as needed.

## 2013-10-30 NOTE — Progress Notes (Signed)
Internal Medicine Attending  Date: 10/30/2013  Patient name: [name] Medical record number: [id number] Date of birth: Mar 12, 1941 Age: 73 y.o. Gender: female  I saw and evaluated the patient on AM rounds, and discussed her care with housestaff.  I reviewed the resident's note by Dr. Glendell DockerKomanski and I agree with the resident's findings and plans as documented in his note.

## 2013-10-30 NOTE — Progress Notes (Signed)
Subjective: Overnight patient received 2x 250cc bolus of NS for borderline hypotension; after IVF, systolic pressures increased to >100.  [name] states she is feeling well this morning, and has no specific concerns or complaints.  She denies lightheadedness, chest pain, palpitations, or shortness of breath.  However, she reports abdominal and rectal pain this afternoon; her nurse reports 8-9 loose stools with hematochezia.  Objective: Vital signs in last 24 hours: Filed Vitals:   10/30/13 0000 10/30/13 0343 10/30/13 0400 10/30/13 0710  BP:  119/38  113/39  Pulse: 52 49 51 49  Temp:  97.5 F (36.4 C)  98.7 F (37.1 C)  TempSrc:  Oral  Oral  Resp: 15 18 18 18   Height:  5\' 4"  (1.626 m)    Weight:  53.978 kg (119 lb)    SpO2: 100% 98% 99% 100%   Weight change:   Intake/Output Summary (Last 24 hours) at 10/30/13 5621 Last data filed at 10/30/13 0100  Gross per 24 hour  Intake    503 ml  Output      3 ml  Net    500 ml   Physical Exam General: Patient eating breakfast; resting comfortably in no acute distress. Eyes: PERRL, EOMI.  Conjunctivae normal, sclerae anicteric. HENT: Oropharyngeal mucous membranes pink and moist. Neck: Supple, no LAD. Pulm: Normal respiratory rate and effort.  CTAB, no wheezes or crackles. Cardio: Bradycardia, S1 and S2 with a 3/6 holosystolic murmur loudest at the RUSB. Abdomen: Normoactive bowel sounds. Soft, non-distended, with mild TTP in lower quadrants Extremities: Warm and well perfused, with no edema or cyanosis. Multiple chronic appearing lesions on lower extremities. AV fistula on left UE. Neuro: Alert, oriented, and appropriate.  CN II-XII grossly intact.  Lab Results:  Results for orders placed during the hospital encounter of 10/29/13 (from the past 24 hour(s))  GLUCOSE, CAPILLARY     Status: Abnormal   Collection Time    10/29/13  4:21 PM      Result Value Ref Range   Glucose-Capillary 229 (*) 70 - 99 mg/dL   Comment 1 Notify RN     Comment 2 Documented in Chart    TSH     Status: None   Collection Time    10/29/13  4:35 PM      Result Value Ref Range   TSH 1.710  0.350 - 4.500 uIU/mL  MAGNESIUM     Status: None   Collection Time    10/29/13  4:35 PM      Result Value Ref Range   Magnesium 2.0  1.5 - 2.5 mg/dL  TROPONIN I     Status: None   Collection Time    10/29/13  4:35 PM      Result Value Ref Range   Troponin I <0.30  <0.30 ng/mL  GLUCOSE, CAPILLARY     Status: Abnormal   Collection Time    10/29/13  9:19 PM      Result Value Ref Range   Glucose-Capillary 145 (*) 70 - 99 mg/dL   Comment 1 Documented in Chart     Comment 2 Notify RN    TROPONIN I     Status: None   Collection Time    10/29/13  9:45 PM      Result Value Ref Range   Troponin I <0.30  <0.30 ng/mL  CBC     Status: Abnormal   Collection Time    10/30/13  6:45 AM      Result Value Ref  Range   WBC 12.8 (*) 4.0 - 10.5 K/uL   RBC 2.89 (*) 3.87 - 5.11 MIL/uL   Hemoglobin 9.0 (*) 12.0 - 15.0 g/dL   HCT 16.1 (*) 09.6 - 04.5 %   MCV 93.8  78.0 - 100.0 fL   MCH 31.1  26.0 - 34.0 pg   MCHC 33.2  30.0 - 36.0 g/dL   RDW 40.9 (*) 81.1 - 91.4 %   Platelets 184  150 - 400 K/uL  RENAL FUNCTION PANEL     Status: Abnormal   Collection Time    10/30/13  6:45 AM      Result Value Ref Range   Sodium 142  137 - 147 mEq/L   Potassium 3.6 (*) 3.7 - 5.3 mEq/L   Chloride 99  96 - 112 mEq/L   CO2 26  19 - 32 mEq/L   Glucose, Bld 90  70 - 99 mg/dL   BUN 33 (*) 6 - 23 mg/dL   Creatinine, Ser 7.82 (*) 0.50 - 1.10 mg/dL   Calcium 9.3  8.4 - 95.6 mg/dL   Phosphorus 5.5 (*) 2.3 - 4.6 mg/dL   Albumin 2.8 (*) 3.5 - 5.2 g/dL   GFR calc non Af Amer 9 (*) >90 mL/min   GFR calc Af Amer 10 (*) >90 mL/min  GLUCOSE, CAPILLARY     Status: None   Collection Time    10/30/13  8:28 AM      Result Value Ref Range   Glucose-Capillary 75  70 - 99 mg/dL   Comment 1 Documented in Chart     Comment 2 Notify RN    GLUCOSE, CAPILLARY     Status: Abnormal   Collection  Time    10/30/13 11:18 AM      Result Value Ref Range   Glucose-Capillary 166 (*) 70 - 99 mg/dL    Studies/Results: Dg Chest Portable 1 View  10/29/2013   CLINICAL DATA:  Cardiopulmonary arrest. Hypotension. Shortness of breath.  EXAM: PORTABLE CHEST - 1 VIEW  COMPARISON:  10/27/2013  FINDINGS: Prior CABG. Moderately enlarged cardiopericardial silhouette with low lung volumes, small right pleural effusion, and interstitial accentuation. Low lung volumes. No pneumothorax or well-defined rib fracture.  IMPRESSION: 1. Cardiomegaly potentially with mild interstitial edema and a small right pleural effusion.   Electronically Signed   By: Herbie Baltimore M.D.   On: 10/29/2013 09:55    Medications: I have reviewed the patient's current medications. Scheduled Meds: . amiodarone  200 mg Oral Daily  . aspirin  81 mg Oral Daily  . atorvastatin  40 mg Oral q1800  . clopidogrel  75 mg Oral Q breakfast  . darbepoetin (ARANESP) injection - DIALYSIS  12.5 mcg Intravenous To Hemo  . [START ON 11/05/2013] darbepoetin (ARANESP) injection - DIALYSIS  12.5 mcg Intravenous Q Mon-HD  . docusate sodium  100 mg Oral Daily  . [START ON 10/31/2013] doxercalciferol  2 mcg Intravenous Q M,W,F-HD  . heparin  5,000 Units Subcutaneous 3 times per day  . insulin aspart  0-9 Units Subcutaneous TID WC  . sodium chloride  3 mL Intravenous Q12H   Continuous Infusions:  PRN Meds:.acetaminophen, acetaminophen  Assessment/Plan: [name] is a 89 woman with a history of PAF, CAD, aortic stenosis, and ESRD on MWF hemodialysis, s/p recent discharge for Afib with RVR, who presents after receiving 10 minutes of CPR with ROSC after an apparent cardiac arrest during her dialysis session on 10/29/13.  She does not remember the event; the  AED showed that the patient was in a non-shockable, idioventricular pattern.  This putative cardiac arrest may have been due to fluid shifts during dialysis, represent bradycardic arrest, or be secondary to  a transient arrythmia.   Per cardiology, patient's symptoms may be consistent with tachycardia/bradycardia syndrome, and will be seen by EP for evaluation for a pacemaker implantation.  Per nephrology, patient will continue on MWF hemodialysis.    # Cardiac Arrest - reported by HD center, though no rhythm strips obtained at the time.  This putative cardiac arrest may represent bradycardic arrest (HR in the 40s) vs arrythmia (history of Afib, CAD, prolonged QT) vs arrest due to hypotension and decreased preload in the setting of symptomatic aortic stenosis and volume changes with HD and possibly diarrhea.  EP evaluation concluded that primary bradycardia was less likely, and that patient is not a candidate for EP procedures.  No current chest pain to suggest acute ischemia as an inciting factor, trop neg x3, EKG with no significant changes.  TSH normal. -admit to stepdown, with telemetry monitoring -cardiology consulted, appreciate assistance with this unclear case  -hold metoprolol  -amiodarone decreased 400mg  -> 200mg  daily -not a candidate for EP procedures -avoid reducing preload  # ESRD - [facility]. Only received about 1 hour of her scheduled treatment 3/2.  Per nephrology, pt does not require HD today given good labs and hypotension overnight.  Today, K is unchanged at 3.6 but patient's creatinine is 4.6, >3.6 yesterday, >2.8 on 10/26/13. -daily weights -pt to receive inpatient HD tomorrow, resuming MWF schedule (10/31/13) -renal function panel in AM  # Paroxysmal Afib - recently discharged 2/28 after an admission for afib with RVR, discharged on amiodarone taper and metoprolol. Currently in sinus bradycardia.  -hold metoprolol, decrease amiodarone as above -will not receive pacemaker per EP note  # Severe Aortic Stenosis - Echo 10/06/13 showing EF 60-65%, but aortic valve area 0.45.  -monitor volume status -avoid reducing preload  # Increased stool frequency - Pt  complains of lower abdominal and rectal pain with defecation.  She reports that this pain is not new, that she has had 3 stools today, and that her stools have been hard.  Her nurse reports that the patient has had 8-9 loose stools today, with hematochezia.  Given recent hospitalization, C diff is a consideration, although patient did not receive antibiotics during past hospitalization. -C diff testing sent -If C diff negative, consider CT to rule out diverticulitis given lower abdominal pain and rectal bleeding  # Bright red blood per rectum - intermittent for the last 3 months per pt report. The patient reports her last colonoscopy was with Dr. Jennye BoroughsMisenheimer, GI, 1-2 years ago, with polyps removed. This most likely represents hemorrhoidal bleeding (multiple hemorrhoids present on DRE) vs diverticulosis. Hb currently stable at 10.1 (baseline 9-10).  -continue to trend CBC's daily  -colace for constipation  -likely no need for GI consult at this time, as Hb stable, but can consider if Hb drops  # QT prolongation - seen on prior EKG's, though mildly higher on admission compared to prior (QTc = 549 from 504). Etiology includes amiodarone usage vs electrolyte imbalance vs thyroid abnormality (though no history).  TSH 1.7, Mg 2.0. - continue telemetry  # CAD - s/p CABG x3 in 2011 (LIMA to LAD, vein to circumflex, vein to PDA), s/p cath Nov 2014 (DES to RCA).  -continue aspirin, plavix, statin  # DM2 - On home Novolog 70/30 on  a sliding scale, pt unclear on dosing. Per dietician, pt has not been taking insulin at home or checking blood glucose.  Patient's A1c is 8.5. -continue SSI while inpatient -consider prescription for glucometer and strips on discharge  Dispo: Disposition is deferred at this time, awaiting improvement of current medical problems. Anticipated discharge in approximately 2-3 day(s).   This is a Psychologist, occupational Note.  The care of the patient was discussed with Dr. Glendell Docker and Dr.  Manson Passey and the assessment and plan formulated with their assistance.  Please see their attached note for official documentation of the daily encounter.   LOS: 1 day   [name], Med Student 10/30/2013, 9:09 AM  Addendum: Records reviewed from patient's colonoscopy on 11/08/07, significant only for hyperplastic rectal polyps and external hemorrhoids, with recommended repeat colonoscopy in 10 years

## 2013-10-30 NOTE — Progress Notes (Addendum)
Inpatient Diabetes Program Recommendations  AACE/ADA: New Consensus Statement on Inpatient Glycemic Control (2013)  Target Ranges:  Prepandial:   less than 140 mg/dL      Peak postprandial:   less than 180 mg/dL (1-2 hours)      Critically ill patients:  140 - 180 mg/dL   Reason for Visit: Hyperglycemia  Diabetes history: DM2 Outpatient Diabetes medications: None - states she hasn't taken her insulin in the past few weeks. Previous on 70/30 10 units in am and 15 units in pm Current orders for Inpatient glycemic control: Novolog sensitive tidwc  Results for [name], [name] (MRN [id number]) as of 10/30/2013 13:37  Ref. Range 10/29/2013 09:16 10/29/2013 16:21 10/29/2013 21:19 10/30/2013 08:28 10/30/2013 11:18  Glucose-Capillary Latest Range: 70-99 mg/dL 409227 (H) 811229 (H) 914145 (H) 75 166 (H)  Results for [name], [name] (MRN [id number]) as of 10/30/2013 13:37  Ref. Range 10/06/2013 14:00  Hemoglobin A1C Latest Range: <5.7 % 8.5 (H)   Blood sugars look ok today. Pt states she needs prescription for glucose meter and strips at discharge. Has not been checking blood sugars or taking insulin at home. Will likely need small dose of Lantus/Levemir while in-patient. Instructed to f/u with PCP regarding high HgbA1C.  Discussed with RN. Thank you. Ailene Ardshonda Zyron Deeley, RD, LDN, CDE Inpatient Diabetes Coordinator [phone]

## 2013-10-30 NOTE — Progress Notes (Signed)
Subjective:  Has been bradycardic overnight and slightly hypotensive- no real symptoms from what I can tell.   Objective Vital signs in last 24 hours: Filed Vitals:   10/30/13 0000 10/30/13 0343 10/30/13 0400 10/30/13 0710  BP:  119/38  113/39  Pulse: 52 49 51 49  Temp:  97.5 F (36.4 C)  98.7 F (37.1 C)  TempSrc:  Oral  Oral  Resp: 15 18 18 18   Height:  5\' 4"  (1.626 m)    Weight:  53.978 kg (119 lb)    SpO2: 100% 98% 99% 100%   Weight change:   Intake/Output Summary (Last 24 hours) at 10/30/13 0843 Last data filed at 10/30/13 0100  Gross per 24 hour  Intake    503 ml  Output      3 ml  Net    500 ml    Dialysis Orders: Dialysis: MWF [facility]  3.5h F160 52kg 3K/2.25 Bath L AVF Prof 4 Heparin none  Hectorol 2 Epo 2200 Hgb 10.5 2/18 sats 24% sat and ferritin 1600 in January; iPTH 433 1/28     Assessment/ Plan: Pt is a 73 y.o. yo female with ESRD who was admitted on 10/29/2013 with questionable arrest at dialysis 3/2   1. Cardiac arrest during HD today - cards seeing, getting EP involved- amio decreased, thinking about possible pacemaker.    Could it also be related to a  relatively large amount of fluid to take during HD on Monday ? 2. ESRD - MWF  Via AVF - post HD K 3.6 after 1 hour of HD - on 3 K bath - will use 4 K bath here - need to keep K up with hx of atrial fibrillation; Favor increasing time to 4 hours due to complex cardiac issues and provide more gently volume removal- We were going to do HD today but given the fact that her labs look good and her hypotension, I will do next on Wed (3/4) to get volume down and back on schedule. 3. Hypertension/volume - norvasc and diltiazem stopped last admission; on amio only now with hypotension 4. Anemia - ESA not given last admission; Hgb 9.0 - resume with HD Aranesp 12.5- question of GIB ? Per primary team 5. Metabolic bone disease - on hectorol and follow Ca levels 6. Nutrition - renal carb mod diet 7. DM - per  primary 8. COPD 9. AS     Payden Docter A    Labs: Basic Metabolic Panel:  Recent Labs Lab 10/24/13 1417 10/26/13 1428 10/29/13 0955 10/30/13 0645  NA 132* 132* 138 142  K 4.8 3.6* 3.6* 3.6*  CL 90* 90* 97 99  CO2 23 26  --  26  GLUCOSE 164* 163* 250* 90  BUN 21 13 29* 33*  CREATININE 3.75* 2.80* 3.60* 4.64*  CALCIUM 9.0 9.0  --  9.3  PHOS 4.8* 4.1  --  5.5*   Liver Function Tests:  Recent Labs Lab 10/26/13 1428 10/29/13 0944 10/30/13 0645  AST  --  27  --   ALT  --  20  --   ALKPHOS  --  157*  --   BILITOT  --  0.4  --   PROT  --  7.2  --   ALBUMIN 2.8* 3.5 2.8*   No results found for this basename: LIPASE, AMYLASE,  in the last 168 hours No results found for this basename: AMMONIA,  in the last 168 hours CBC:  Recent Labs Lab 10/25/13 0645 10/26/13 0450 10/27/13  0345 10/29/13 0944 10/29/13 0955 10/30/13 0645  WBC 6.6 5.8 6.9 19.2*  --  12.8*  NEUTROABS  --   --   --  16.8*  --   --   HGB 10.6* 10.7* 9.1* 10.1* 12.2 9.0*  HCT 32.4* 32.9* 28.6* 30.9* 36.0 27.1*  MCV 93.9 95.1 95.0 95.4  --  93.8  PLT 203 198 193 170  --  184   Cardiac Enzymes:  Recent Labs Lab 10/23/13 1115 10/29/13 1635 10/29/13 2145  TROPONINI <0.30 <0.30 <0.30   CBG:  Recent Labs Lab 10/27/13 1613 10/29/13 0916 10/29/13 1621 10/29/13 2119 10/30/13 0828  GLUCAP 219* 227* 229* 145* 75    Iron Studies: No results found for this basename: IRON, TIBC, TRANSFERRIN, FERRITIN,  in the last 72 hours Studies/Results: Dg Chest Portable 1 View  10/29/2013   CLINICAL DATA:  Cardiopulmonary arrest. Hypotension. Shortness of breath.  EXAM: PORTABLE CHEST - 1 VIEW  COMPARISON:  10/27/2013  FINDINGS: Prior CABG. Moderately enlarged cardiopericardial silhouette with low lung volumes, small right pleural effusion, and interstitial accentuation. Low lung volumes. No pneumothorax or well-defined rib fracture.  IMPRESSION: 1. Cardiomegaly potentially with mild interstitial edema  and a small right pleural effusion.   Electronically Signed   By: Herbie BaltimoreWalt  Liebkemann M.D.   On: 10/29/2013 09:55   Medications: Infusions:    Scheduled Medications: . amiodarone  200 mg Oral Daily  . aspirin  81 mg Oral Daily  . atorvastatin  40 mg Oral q1800  . clopidogrel  75 mg Oral Q breakfast  . darbepoetin (ARANESP) injection - DIALYSIS  12.5 mcg Intravenous To Hemo  . [START ON 11/05/2013] darbepoetin (ARANESP) injection - DIALYSIS  12.5 mcg Intravenous Q Mon-HD  . docusate sodium  100 mg Oral Daily  . [START ON 10/31/2013] doxercalciferol  2 mcg Intravenous Q M,W,F-HD  . heparin  5,000 Units Subcutaneous 3 times per day  . insulin aspart  0-9 Units Subcutaneous TID WC  . sodium chloride  3 mL Intravenous Q12H    have reviewed scheduled and prn medications.  Physical Exam: General: NAD- sitting on side of bed eating breakfast Heart: bradycardic Lungs: mostly clear Abdomen: soft non tender Extremities: 1+ pitting edema Dialysis Access: left upper AVF- aneurysmal and patent    10/30/2013,8:43 AM  LOS: 1 day

## 2013-10-30 NOTE — Progress Notes (Signed)
I have read and agree with the medical students note. Please see me not for additional details.   Angelina Sheriffhris Elise Knobloch, MD PGY1

## 2013-10-30 NOTE — Progress Notes (Signed)
10/30/2013 5:50 PM  Stool sample sent for Cdiff.  Report called back positive.  Teaching notified.  Orders for antibiotics received.   Jaclyn ShaggyStanfield, Wilder Amodei Everhart

## 2013-10-31 DIAGNOSIS — A0472 Enterocolitis due to Clostridium difficile, not specified as recurrent: Secondary | ICD-10-CM

## 2013-10-31 LAB — CBC WITH DIFFERENTIAL/PLATELET
BASOS PCT: 0 % (ref 0–1)
Basophils Absolute: 0 10*3/uL (ref 0.0–0.1)
EOS ABS: 0.4 10*3/uL (ref 0.0–0.7)
Eosinophils Relative: 3 % (ref 0–5)
HCT: 26.8 % — ABNORMAL LOW (ref 36.0–46.0)
Hemoglobin: 9.1 g/dL — ABNORMAL LOW (ref 12.0–15.0)
Lymphocytes Relative: 9 % — ABNORMAL LOW (ref 12–46)
Lymphs Abs: 1 10*3/uL (ref 0.7–4.0)
MCH: 30.7 pg (ref 26.0–34.0)
MCHC: 34 g/dL (ref 30.0–36.0)
MCV: 90.5 fL (ref 78.0–100.0)
Monocytes Absolute: 1.2 10*3/uL — ABNORMAL HIGH (ref 0.1–1.0)
Monocytes Relative: 10 % (ref 3–12)
Neutro Abs: 9.2 10*3/uL — ABNORMAL HIGH (ref 1.7–7.7)
Neutrophils Relative %: 78 % — ABNORMAL HIGH (ref 43–77)
PLATELETS: 307 10*3/uL (ref 150–400)
RBC: 2.96 MIL/uL — ABNORMAL LOW (ref 3.87–5.11)
RDW: 16.2 % — ABNORMAL HIGH (ref 11.5–15.5)
WBC: 11.8 10*3/uL — ABNORMAL HIGH (ref 4.0–10.5)

## 2013-10-31 LAB — BASIC METABOLIC PANEL
BUN: 47 mg/dL — AB (ref 6–23)
CO2: 20 mEq/L (ref 19–32)
Calcium: 9.1 mg/dL (ref 8.4–10.5)
Chloride: 96 mEq/L (ref 96–112)
Creatinine, Ser: 5.43 mg/dL — ABNORMAL HIGH (ref 0.50–1.10)
GFR calc Af Amer: 8 mL/min — ABNORMAL LOW (ref >90)
GFR, EST NON AFRICAN AMERICAN: 7 mL/min — AB (ref >90)
GLUCOSE: 85 mg/dL (ref 70–99)
Potassium: 3.7 mEq/L (ref 3.7–5.3)
SODIUM: 138 meq/L (ref 137–147)

## 2013-10-31 LAB — GLUCOSE, CAPILLARY
GLUCOSE-CAPILLARY: 124 mg/dL — AB (ref 70–99)
Glucose-Capillary: 103 mg/dL — ABNORMAL HIGH (ref 70–99)
Glucose-Capillary: 92 mg/dL (ref 70–99)
Glucose-Capillary: 144 mg/dL — ABNORMAL HIGH (ref 70–99)

## 2013-10-31 MED ORDER — DARBEPOETIN ALFA-POLYSORBATE 25 MCG/0.42ML IJ SOLN
INTRAMUSCULAR | Status: AC
  Filled 2013-10-31: qty 0.42

## 2013-10-31 MED ORDER — VANCOMYCIN 50 MG/ML ORAL SOLUTION
125.0000 mg | Freq: Four times a day (QID) | ORAL | Status: DC
  Administered 2013-10-31 – 2013-11-05 (×20): 125 mg via ORAL
  Filled 2013-10-31 (×28): qty 2.5

## 2013-10-31 MED ORDER — DOXERCALCIFEROL 4 MCG/2ML IV SOLN
INTRAVENOUS | Status: AC
Start: 2013-10-31 — End: 2013-11-01
  Filled 2013-10-31: qty 2

## 2013-10-31 MED ORDER — INSULIN ASPART 100 UNIT/ML ~~LOC~~ SOLN
0.0000 [IU] | Freq: Three times a day (TID) | SUBCUTANEOUS | Status: DC
  Administered 2013-10-31 (×2): 1 [IU] via SUBCUTANEOUS
  Administered 2013-11-01: 2 [IU] via SUBCUTANEOUS
  Administered 2013-11-01 (×2): 3 [IU] via SUBCUTANEOUS
  Administered 2013-11-02 – 2013-11-03 (×3): 2 [IU] via SUBCUTANEOUS
  Administered 2013-11-03 – 2013-11-04 (×2): 5 [IU] via SUBCUTANEOUS
  Administered 2013-11-04: 3 [IU] via SUBCUTANEOUS
  Administered 2013-11-04 – 2013-11-05 (×2): 2 [IU] via SUBCUTANEOUS
  Administered 2013-11-05 – 2013-11-06 (×3): 1 [IU] via SUBCUTANEOUS
  Administered 2013-11-06: 2 [IU] via SUBCUTANEOUS
  Administered 2013-11-07 – 2013-11-08 (×3): 1 [IU] via SUBCUTANEOUS
  Administered 2013-11-09: 5 [IU] via SUBCUTANEOUS
  Administered 2013-11-10: 1 [IU] via SUBCUTANEOUS
  Administered 2013-11-10 – 2013-11-11 (×3): 2 [IU] via SUBCUTANEOUS
  Administered 2013-11-11 – 2013-11-12 (×2): 1 [IU] via SUBCUTANEOUS

## 2013-10-31 MED ORDER — SODIUM CHLORIDE 0.9 % IV BOLUS (SEPSIS)
250.0000 mL | Freq: Once | INTRAVENOUS | Status: AC
  Administered 2013-10-31: 250 mL via INTRAVENOUS

## 2013-10-31 NOTE — Progress Notes (Signed)
Internal Medicine Attending  Date: 10/31/2013  Patient name: [name] Medical record number: [id number] Date of birth: 01/29/1941 Age: 73 y.o. Gender: female  I saw and evaluated the patient, and discussed her care on A.M rounds with housestaff.  I reviewed the resident's note by Dr. Glendell DockerKomanski and I agree with the resident's findings and plans as documented in his note.

## 2013-10-31 NOTE — Progress Notes (Signed)
Subjective: Overnight, patient received a 250cc bolus of IVF given borderline hypotension.  Recorded I&O's may not reflect volume loss from stools.  Patient has continued diarrhea, and continues to complain of lower abdominal pain this morning.  No chest pain or shortness of breath.  She is scheduled for inpatient hemodialysis today.  Objective:  Vital signs in last 24 hours: Filed Vitals:   10/31/13 0400 10/31/13 0450 10/31/13 0545 10/31/13 0812  BP: 112/41  112/42 107/38  Pulse: 51  51 50  Temp: 98.3 F (36.8 C)   98.5 F (36.9 C)  TempSrc: Oral   Oral  Resp: 19  20 22   Height:      Weight:  54.1 kg (119 lb 4.3 oz)    SpO2: 99%  96% 99%   Weight change: 0 kg (0 lb)  Intake/Output Summary (Last 24 hours) at 10/31/13 1115 Last data filed at 10/31/13 0937  Gross per 24 hour  Intake    483 ml  Output      0 ml  Net    483 ml   Physical Exam General: Resting comfortably in no acute distress. Eyes: PERRL, EOMI.  Conjunctivae normal, sclerae clear and anicteric. HENT: Oropharyngeal mucous membranes pink and moist. Pulm: Normal respiratory rate and effort. Cardio: Bradycardic, with holosystolic murmur loudest at RUSB. Abdomen: Normoactive bowel sounds. Soft, non-distended, mildly tender to palpations in lower quadrants. Extremities: Warm and well perfused, with multiple poorly healing superficial lesions over lower extremities. Neuro: Alert, oriented, and appropriate.  CN II-XII grossly intact.  Lab Results:  24 Hour Labs  Results for orders placed during the hospital encounter of 10/29/13 (from the past 24 hour(s))  CLOSTRIDIUM DIFFICILE BY PCR     Status: Abnormal   Collection Time    10/30/13  2:55 PM      Result Value Ref Range   C difficile by pcr POSITIVE (*) NEGATIVE  GLUCOSE, CAPILLARY     Status: Abnormal   Collection Time    10/30/13  4:50 PM      Result Value Ref Range   Glucose-Capillary 148 (*) 70 - 99 mg/dL  GLUCOSE, CAPILLARY     Status: Abnormal   Collection Time    10/30/13  9:24 PM      Result Value Ref Range   Glucose-Capillary 108 (*) 70 - 99 mg/dL   Comment 1 Notify RN     Comment 2 Documented in Chart    GLUCOSE, CAPILLARY     Status: Abnormal   Collection Time    10/31/13 12:55 AM      Result Value Ref Range   Glucose-Capillary 103 (*) 70 - 99 mg/dL   Comment 1 Notify RN     Comment 2 Documented in Chart    GLUCOSE, CAPILLARY     Status: None   Collection Time    10/31/13  8:26 AM      Result Value Ref Range   Glucose-Capillary 92  70 - 99 mg/dL  BASIC METABOLIC PANEL     Status: Abnormal   Collection Time    10/31/13  8:45 AM      Result Value Ref Range   Sodium 138  137 - 147 mEq/L   Potassium 3.7  3.7 - 5.3 mEq/L   Chloride 96  96 - 112 mEq/L   CO2 20  19 - 32 mEq/L   Glucose, Bld 85  70 - 99 mg/dL   BUN 47 (*) 6 - 23 mg/dL   Creatinine, Ser  5.43 (*) 0.50 - 1.10 mg/dL   Calcium 9.1  8.4 - 16.1 mg/dL   GFR calc non Af Amer 7 (*) >90 mL/min   GFR calc Af Amer 8 (*) >90 mL/min  CBC WITH DIFFERENTIAL     Status: Abnormal   Collection Time    10/31/13  8:45 AM      Result Value Ref Range   WBC 11.8 (*) 4.0 - 10.5 K/uL   RBC 2.96 (*) 3.87 - 5.11 MIL/uL   Hemoglobin 9.1 (*) 12.0 - 15.0 g/dL   HCT 09.6 (*) 04.5 - 40.9 %   MCV 90.5  78.0 - 100.0 fL   MCH 30.7  26.0 - 34.0 pg   MCHC 34.0  30.0 - 36.0 g/dL   RDW 81.1 (*) 91.4 - 78.2 %   Platelets 307  150 - 400 K/uL   Neutrophils Relative % 78 (*) 43 - 77 %   Neutro Abs 9.2 (*) 1.7 - 7.7 K/uL   Lymphocytes Relative 9 (*) 12 - 46 %   Lymphs Abs 1.0  0.7 - 4.0 K/uL   Monocytes Relative 10  3 - 12 %   Monocytes Absolute 1.2 (*) 0.1 - 1.0 K/uL   Eosinophils Relative 3  0 - 5 %   Eosinophils Absolute 0.4  0.0 - 0.7 K/uL   Basophils Relative 0  0 - 1 %   Basophils Absolute 0.0  0.0 - 0.1 K/uL   TREND OF RENAL FUNCTION AND BLOOD COUNTS  BUN  Date/Time Value Ref Range Status  10/31/2013  8:45 AM 47* 6 - 23 mg/dL Final  05/04/6212  0:86 AM 33* 6 - 23 mg/dL Final    01/04/8468  6:29 AM 29* 6 - 23 mg/dL Final  01/25/4131  4:40 PM 13  6 - 23 mg/dL Final  09/01/7251  6:64 PM 21  6 - 23 mg/dL Final     Creat  Date/Time Value Ref Range Status  06/19/2013  2:28 PM 4.32* 0.50 - 1.10 mg/dL Final     Creatinine, Ser  Date/Time Value Ref Range Status  10/31/2013  8:45 AM 5.43* 0.50 - 1.10 mg/dL Final  4/0/3474  2:59 AM 4.64* 0.50 - 1.10 mg/dL Final  01/02/3874  6:43 AM 3.60* 0.50 - 1.10 mg/dL Final  11/26/5186  4:16 PM 2.80* 0.50 - 1.10 mg/dL Final  02/03/3015  0:10 PM 3.75* 0.50 - 1.10 mg/dL Final     Potassium  Date/Time Value Ref Range Status  10/31/2013  8:45 AM 3.7  3.7 - 5.3 mEq/L Final  10/30/2013  6:45 AM 3.6* 3.7 - 5.3 mEq/L Final  10/29/2013  9:55 AM 3.6* 3.7 - 5.3 mEq/L Final  10/26/2013  2:28 PM 3.6* 3.7 - 5.3 mEq/L Final  10/24/2013  2:17 PM 4.8  3.7 - 5.3 mEq/L Final     HEMOLYSIS AT THIS LEVEL MAY AFFECT RESULT     WBC  Date/Time Value Ref Range Status  10/31/2013  8:45 AM 11.8* 4.0 - 10.5 K/uL Final  10/30/2013  6:45 AM 12.8* 4.0 - 10.5 K/uL Final  10/29/2013  9:44 AM 19.2* 4.0 - 10.5 K/uL Final  10/27/2013  3:45 AM 6.9  4.0 - 10.5 K/uL Final  10/26/2013  4:50 AM 5.8  4.0 - 10.5 K/uL Final     Hemoglobin  Date/Time Value Ref Range Status  10/31/2013  8:45 AM 9.1* 12.0 - 15.0 g/dL Final  05/02/2354  7:32 AM 9.0* 12.0 - 15.0 g/dL Final     DELTA CHECK NOTED  REPEATED TO VERIFY  10/29/2013  9:55 AM 12.2  12.0 - 15.0 g/dL Final  1/6/10963/09/2013  0:459:44 AM 10.1* 12.0 - 15.0 g/dL Final  4/09/81192/28/2015  1:473:45 AM 9.1* 12.0 - 15.0 g/dL Final     HCT  Date/Time Value Ref Range Status  10/31/2013  8:45 AM 26.8* 36.0 - 46.0 % Final  10/30/2013  6:45 AM 27.1* 36.0 - 46.0 % Final  10/29/2013  9:55 AM 36.0  36.0 - 46.0 % Final  10/29/2013  9:44 AM 30.9* 36.0 - 46.0 % Final  10/27/2013  3:45 AM 28.6* 36.0 - 46.0 % Final    Studies/Results:  Medications: I have reviewed the patient's current medications.  Scheduled Meds: . amiodarone  200 mg Oral Daily  . aspirin  81 mg Oral  Daily  . atorvastatin  40 mg Oral q1800  . clopidogrel  75 mg Oral Q breakfast  . darbepoetin (ARANESP) injection - DIALYSIS  12.5 mcg Intravenous Q Wed-HD  . doxercalciferol  2 mcg Intravenous Q M,W,F-HD  . heparin  5,000 Units Subcutaneous 3 times per day  . insulin aspart  0-9 Units Subcutaneous TID WC  . sodium chloride  3 mL Intravenous Q12H  . vancomycin  125 mg Oral QID   Continuous Infusions:  PRN Meds:.acetaminophen, acetaminophen Assessment/Plan: Principal Problem:   Clostridium difficile colitis Active Problems:   End stage renal disease   DM (diabetes mellitus)   HTN (hypertension)   Aortic stenosis, moderate at cath 07/11/13   PAF (paroxysmal atrial fibrillation)   Atrial fibrillation   Cardiac arrest   Sinus bradycardia   Aortic stenosis  [name] is a 73 year old woman with a history of PAF, CAD, aortic stenosis, and ESRD on MWF hemodialysis, s/p recent discharge for Afib with RVR, who presents after receiving 10 minutes of CPR with ROSC after an apparent cardiac arrest during her dialysis session on 10/29/13. She does not remember the event; the AED reportedly showed that the patient was in a non-shockable, idioventricular pattern.   # Cardiac Event - putative cardiac arrest reported by HD center, though no rhythm strips obtained at the time.  Event appears more consistent with hypotension-induced syncope during dialysis in the setting of severe AS and likely volume depletion from C difficile diarrhea.  Less likely, event may represent either bradycardic arrest (HR in the 40s) vs arrythmia (history of Afib, CAD, prolonged QT).  No current chest pain to suggest acute ischemia as an inciting factor, trop neg x3, EKG with no significant changes. TSH normal.  EP evaluation concluded that primary bradycardia was unlikely, and that patient is not a candidate for EP procedures.  -keep patient on stepdown unit with telemetry monitoring, given borderline hypotension, difficulty of  maintaining adequate preload in the setting of ESRD, and high level of nursing care due to C diff. infection  -cardiology consulted, appreciate assistance with this unclear case  -hold metoprolol  -continue amiodarone 200mg  daily  -not a candidate for EP procedures -per nephrology note, time of dialysis to be increased for more gentle volume change -continue to monitor patient's blood pressure, and maintain preload with cautious IVF boluses as needed  # ESRD - [facility]. Only received about 1 hour of her scheduled treatment 3/2.  During admission, patient's BUN has increased 29 -> 33 -> 47, and Cr has increased 3.6 -> 4.6 -> 5.4.  K has been stable at 3.6-3.7.   -daily weights -patient to receive inpatient hemodialysis today, per  nephrology.  -basic metabolic panels qAM   # Increased stool frequency - Pt complains of lower abdominal pain and frequent stools.  C diff positive; patient started on flagyl on 3/3, switched to po vancomycin this morning for severe C diff infection given age and leukocytosis.  WBC 19.2 -> 12.8 -> 11.8; ANC 16.8 ->9.2. - continue po vancomycin 125mg  4 times daily - continue to monitor volume status  # Bright red blood per rectum - Intermittent blood and rectal pain with defecation for the last 3 months per pt report.  Colonoscopy with Dr. Jennye Boroughs in 2009 showed external hemorroids, removed hyperplastic polyps with recommended 10 year follow up.  This most likely represents hemorrhoidal bleeding (multiple hemorrhoids present on DRE) in the setting of increased stool frequency, vs diverticulosis. Hb currently stable at 9.1 (baseline 9-10).  -continue to trend CBC's daily  -discontinued colace for constipation  -likely no need for GI consult at this time, as Hb stable, but can consider if Hb drops   # Paroxysmal Afib - recently discharged 2/28 after an admission for afib with RVR, discharged on amiodarone taper and metoprolol. Currently in  sinus bradycardia; 24 hr HR ranging 49-53. -hold metoprolol, continued amiodarone at decreased dose as above  -will not receive pacemaker per EP note   # Severe Aortic Stenosis - Echo 10/06/13 showing EF 60-65%, but aortic valve area 0.45. 24 hr blood pressures ranging 107/38 - 121/41 -monitor volume status  -avoid reducing preload   # QT prolongation - seen on prior EKG's, though mildly higher on admission compared to prior (QTc = 549 from 504). Etiology includes amiodarone usage vs electrolyte imbalance vs thyroid abnormality (though no history). TSH 1.7, Mg 2.0.  -continue telemetry -patient already on anti-arrhythmic therapy and not candidate for EP procedure  # CAD - s/p CABG x3 in 2011 (LIMA to LAD, vein to circumflex, vein to PDA), s/p cath Nov 2014 (DES to RCA).  -continue aspirin, plavix, statin   # DM2 - On home Novolog 70/30 on a sliding scale, pt unclear on dosing. Per dietician, pt has not been taking insulin at home or checking blood glucose. Patient's A1c is 8.5. Patient has received 1 - 4 units of insulin total per day in hospital -continue SSI while inpatient  -consider prescription for glucometer and strips on discharge   Dispo: Disposition is deferred at this time, awaiting improvement of C diff diarrhea and patient response to inpatient hemodialysis.  Anticipated discharge in approximately 1-2 day(s).   This is a Psychologist, occupational Note.  The care of the patient was discussed with Dr. Glendell Docker and the assessment and plan formulated with their assistance.  Please see their attached note for official documentation of the daily encounter.   LOS: 2 days   Jorge Mandril, Med Student 10/31/2013, 11:15 AM

## 2013-10-31 NOTE — Progress Notes (Signed)
I have read and agree with the medical students note. Please see my note for additional details.   Angelina Sheriffhris Ogden Handlin, MD PGY 1

## 2013-10-31 NOTE — Progress Notes (Signed)
Subjective:  Patient continues to have numerous loose stools. She denies chest pain. She denies dizzynes sor lightheadedness. She admits to mild LLQ abdominal pain.    Objective: Vital signs in last 24 hours: Filed Vitals:   10/31/13 0450 10/31/13 0545 10/31/13 0812 10/31/13 1119  BP:  112/42 107/38 116/43  Pulse:  51 50 50  Temp:   98.5 F (36.9 C) 99.1 F (37.3 C)  TempSrc:   Oral Oral  Resp:  20 22 19   Height:      Weight: 119 lb 4.3 oz (54.1 kg)     SpO2:  96% 99% 100%   Weight change: 0 lb (0 kg)  Intake/Output Summary (Last 24 hours) at 10/31/13 1331 Last data filed at 10/31/13 [id number]  Gross per 24 hour  Intake    243 ml  Output      0 ml  Net    243 ml   Physical Exam  Constitutional: She is oriented to person, place, and time. She appears well-developed and well-nourished. No distress.  HENT:  Head: Normocephalic.  Mouth/Throat: Oropharynx is clear and moist. No oropharyngeal exudate.  Cardiovascular: Normal rate, regular rhythm and intact distal pulses.  Exam reveals no gallop and no friction rub.   Murmur heard. 4/6 systolic crescendo decrescendo murmur in LUSB that radiates to carotids.  Pulmonary/Chest: Effort normal and breath sounds normal. No respiratory distress. She has no wheezes. She has no rales.  Abdominal: Soft. Bowel sounds are normal. She exhibits no distension. There is tenderness.  Mildly tender in LLQ  Musculoskeletal: She exhibits no edema and no tenderness.  Good bruit in left AVF  Neurological: She is alert and oriented to person, place, and time.  Skin: She is not diaphoretic.  Psychiatric: She has a normal mood and affect. Her behavior is normal.    Lab Results: Basic Metabolic Panel:  Recent Labs Lab 10/26/13 1428  10/29/13 1635 10/30/13 0645 10/31/13 0845  NA 132*  < >  --  142 138  K 3.6*  < >  --  3.6* 3.7  CL 90*  < >  --  99 96  CO2 26  --   --  26 20  GLUCOSE 163*  < >  --  90 85  BUN 13  < >  --  33* 47*    CREATININE 2.80*  < >  --  4.64* 5.43*  CALCIUM 9.0  --   --  9.3 9.1  MG  --   --  2.0  --   --   PHOS 4.1  --   --  5.5*  --   < > = values in this interval not displayed. Liver Function Tests:  Recent Labs Lab 10/29/13 0944 10/30/13 0645  AST 27  --   ALT 20  --   ALKPHOS 157*  --   BILITOT 0.4  --   PROT 7.2  --   ALBUMIN 3.5 2.8*   CBC:  Recent Labs Lab 10/29/13 0944  10/30/13 0645 10/31/13 0845  WBC 19.2*  --  12.8* 11.8*  NEUTROABS 16.8*  --   --  9.2*  HGB 10.1*  < > 9.0* 9.1*  HCT 30.9*  < > 27.1* 26.8*  MCV 95.4  --  93.8 90.5  PLT 170  --  184 307  < > = values in this interval not displayed. Cardiac Enzymes:  Recent Labs Lab 10/29/13 1635 10/29/13 2145  TROPONINI <0.30 <0.30   CBG:  Recent Labs  Lab 10/30/13 1118 10/30/13 1650 10/30/13 2124 10/31/13 0055 10/31/13 0826 10/31/13 1121  GLUCAP 166* 148* 108* 103* 92 144*   Thyroid Function Tests:  Recent Labs Lab 10/29/13 1635  TSH 1.710    Micro Results: Recent Results (from the past 240 hour(s))  MRSA PCR SCREENING     Status: None   Collection Time    10/29/13  1:59 PM      Result Value Ref Range Status   MRSA by PCR NEGATIVE  NEGATIVE Final   Comment:            The GeneXpert MRSA Assay (FDA     approved for NASAL specimens     only), is one component of a     comprehensive MRSA colonization     surveillance program. It is not     intended to diagnose MRSA     infection nor to guide or     monitor treatment for     MRSA infections.  CLOSTRIDIUM DIFFICILE BY PCR     Status: Abnormal   Collection Time    10/30/13  2:55 PM      Result Value Ref Range Status   C difficile by pcr POSITIVE (*) NEGATIVE Final   Comment: CRITICAL RESULT CALLED TO, READ BACK BY AND VERIFIED WITH:     STANFIELD RN 17:20 10/30/13 ([email or web address])   Studies/Results: No results found. Medications: I have reviewed the patient's current medications. Scheduled Meds: . amiodarone  200 mg Oral Daily  .  aspirin  81 mg Oral Daily  . atorvastatin  40 mg Oral q1800  . clopidogrel  75 mg Oral Q breakfast  . darbepoetin (ARANESP) injection - DIALYSIS  12.5 mcg Intravenous Q Wed-HD  . doxercalciferol  2 mcg Intravenous Q M,W,F-HD  . heparin  5,000 Units Subcutaneous 3 times per day  . insulin aspart  0-9 Units Subcutaneous TID WC  . sodium chloride  3 mL Intravenous Q12H  . vancomycin  125 mg Oral QID   Continuous Infusions:  PRN Meds:.acetaminophen, acetaminophen Assessment/Plan: Principal Problem:   Clostridium difficile colitis Active Problems:   End stage renal disease   DM (diabetes mellitus)   HTN (hypertension)   Aortic stenosis, moderate at cath 07/11/13   PAF (paroxysmal atrial fibrillation)   Atrial fibrillation   Cardiac arrest   Sinus bradycardia   Aortic stenosis  C Diff Colitis: Patient likely has C Diff given positive Ag and profuse loose BM. She was started on flagyl and transitioned to vancomycin PO given severity (WBC >15,000 at presentation and age).  - Patient requires normotension given severe AS and recent arrest due to hypotension.  - Use 250 cc bolus as needed to maintain MAP > 60.  - PO Vancomycin per pharmacy - Step down Unit  Cardiac Arrest - reported by HD center. Patient appears to have had PEA 2/2 to hypotension during HD in setting of severe AS complicated by hypovolemia due to c diff colitis.  -admit to stepdown, with telemetry monitoring  -cardiology consulted, appreciate rec's -hold metoprolol  -amiodarone decreased 400mg  BID -> 200mg  BID  - appreciate cardiologies rec's regarding AS  Severe Aortic Stenosis - Echo 10/06/13 showing EF 60-65%, but aortic valve area 0.45.  -monitor volume status  - Maintain BP as AS requires afterload for cardiac perfusion - appreciate cardiologies rec's regarding AS  ESRD - on MWF HD at [facility] dialysis center. Patient will have HD today. -daily weights  -renal function panel in AM   Bright  red blood per  rectum - intermittent for the last 3 months per pt report. The patient reports her last colonoscopy was with Dr. Jennye Boroughs, GI, 1-2 years ago, with polyps removed. This most likely represents hemorrhoidal bleeding (multiple hemorrhoids present on DRE). Hb currently stable at 10.1 (baseline 9-10).  -trend CBC's daily  -colace for constipation  -likely no need for GI consult at this time, as Hb stable, but can consider if Hb drops   QT prolongation - seen on prior EKG's, though mildly higher today compared to prior (QTc = 549 from 504). Etiology includes amiodarone usage vs electrolyte imbalance. -Mg = 2.0, TSH = 1.71 - Avoid QT prolongation drugs.   CAD - s/p CABG x3 in 2011 (LIMA to LAD, vein to circumflex, vein to PDA), s/p cath Nov 2014 (DES to RCA).  -continue aspirin, plavix, statin   DM2 - on home Novolog 70/30 on a sliding scale, pt unclear on dosing.  -Continue inpatient SSI protocol  Paroxysmal Afib - recently discharged 2/28 after an admission for afib with RVR, discharged on amiodarone taper and metoprolol. Currently in sinus bradycardia. See plan as above. -continue amiodarone  -hold metoprolol      Dispo: Disposition is deferred at this time, awaiting improvement of current medical problems.  Anticipated discharge in approximately 2 day(s).   The patient does have a current PCP (Imran Clarnce Flock, MD) and does not need an [facility] hospital follow-up appointment after discharge.  The patient does have transportation limitations that hinder transportation to clinic appointments.  .Services Needed at time of discharge: Y = Yes, Blank = No PT:   OT:   RN:   Equipment:   Other:     LOS: 2 days   Pleas Koch, MD 10/31/2013, 1:31 PM

## 2013-10-31 NOTE — Progress Notes (Signed)
Subjective:  remains bradycardic overnight and slightly hypotensive- no real symptoms from what I can tell.  Cards not thinking she needs EP procedure - new C diff dx Objective Vital signs in last 24 hours: Filed Vitals:   10/31/13 0400 10/31/13 0450 10/31/13 0545 10/31/13 0812  BP: 112/41  112/42 107/38  Pulse: 51  51 50  Temp: 98.3 F (36.8 C)   98.5 F (36.9 C)  TempSrc: Oral   Oral  Resp: 19  20 22   Height:      Weight:  54.1 kg (119 lb 4.3 oz)    SpO2: 99%  96% 99%   Weight change: 0 kg (0 lb)  Intake/Output Summary (Last 24 hours) at 10/31/13 1042 Last data filed at 10/31/13 1610  Gross per 24 hour  Intake    483 ml  Output      0 ml  Net    483 ml    Dialysis Orders: Dialysis: MWF [facility]  3.5h F160 52kg 3K/2.25 Bath L AVF Prof 4 Heparin none  Hectorol 2 Epo 2200 Hgb 10.5 2/18 sats 24% sat and ferritin 1600 in January; iPTH 433 1/28     Assessment/ Plan: Pt is a 73 y.o. yo female with ESRD who was admitted on 10/29/2013 with questionable arrest at dialysis 3/2   1. Cardiac arrest during HD today - cards seeing- amio decreased.  Are thinking that she does not need aggressive management of this as could have been situational.    Could it also be related to a relatively large amount of fluid to take during HD on Monday ? 2. ESRD - MWF  Via AVF - post HD K 3.6 after 1 hour of HD - on 3 K bath - will use 4 K bath here - need to keep K up with hx of atrial fibrillation; Favor increasing time to 4 hours due to complex cardiac issues and provide more gently volume removal- I will do next today to get volume down and back on schedule. 3. Hypertension/volume - norvasc and diltiazem stopped last admission; on amio only now with hypotension 4. Anemia - ESA not given last admission; Hgb 9.0 - resume with HD Aranesp 12.5- question of GIB ? Per primary team 5. Metabolic bone disease - on hectorol and follow Ca levels 6. Nutrition - renal carb mod diet 7. DM - per  primary 8. COPD 9. AS  10. Dispo- new C diff - now on PO vanc.  We need to see how she tolerates HD today-     Johnte Portnoy A    Labs: Basic Metabolic Panel:  Recent Labs Lab 10/24/13 1417 10/26/13 1428 10/29/13 0955 10/30/13 0645 10/31/13 0845  NA 132* 132* 138 142 138  K 4.8 3.6* 3.6* 3.6* 3.7  CL 90* 90* 97 99 96  CO2 23 26  --  26 20  GLUCOSE 164* 163* 250* 90 85  BUN 21 13 29* 33* 47*  CREATININE 3.75* 2.80* 3.60* 4.64* 5.43*  CALCIUM 9.0 9.0  --  9.3 9.1  PHOS 4.8* 4.1  --  5.5*  --    Liver Function Tests:  Recent Labs Lab 10/26/13 1428 10/29/13 0944 10/30/13 0645  AST  --  27  --   ALT  --  20  --   ALKPHOS  --  157*  --   BILITOT  --  0.4  --   PROT  --  7.2  --   ALBUMIN 2.8* 3.5 2.8*   No results found  for this basename: LIPASE, AMYLASE,  in the last 168 hours No results found for this basename: AMMONIA,  in the last 168 hours CBC:  Recent Labs Lab 10/26/13 0450 10/27/13 0345 10/29/13 0944 10/29/13 0955 10/30/13 0645 10/31/13 0845  WBC 5.8 6.9 19.2*  --  12.8* 11.8*  NEUTROABS  --   --  16.8*  --   --  9.2*  HGB 10.7* 9.1* 10.1* 12.2 9.0* 9.1*  HCT 32.9* 28.6* 30.9* 36.0 27.1* 26.8*  MCV 95.1 95.0 95.4  --  93.8 90.5  PLT 198 193 170  --  184 307   Cardiac Enzymes:  Recent Labs Lab 10/29/13 1635 10/29/13 2145  TROPONINI <0.30 <0.30   CBG:  Recent Labs Lab 10/30/13 1118 10/30/13 1650 10/30/13 2124 10/31/13 0055 10/31/13 0826  GLUCAP 166* 148* 108* 103* 92    Iron Studies: No results found for this basename: IRON, TIBC, TRANSFERRIN, FERRITIN,  in the last 72 hours Studies/Results: No results found. Medications: Infusions:    Scheduled Medications: . amiodarone  200 mg Oral Daily  . aspirin  81 mg Oral Daily  . atorvastatin  40 mg Oral q1800  . clopidogrel  75 mg Oral Q breakfast  . darbepoetin (ARANESP) injection - DIALYSIS  12.5 mcg Intravenous Q Wed-HD  . doxercalciferol  2 mcg Intravenous Q M,W,F-HD  .  heparin  5,000 Units Subcutaneous 3 times per day  . insulin aspart  0-9 Units Subcutaneous TID WC  . sodium chloride  3 mL Intravenous Q12H  . vancomycin  125 mg Oral QID    have reviewed scheduled and prn medications.  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

## 2013-10-31 NOTE — Progress Notes (Signed)
73yo female admitted 3/2 s/p cardiac arrest, now c/o increased frequency of loose stools, Cdiff PCR positive w/ severe course (ESRD).  Will begin vancomycin 125mg  po QID x14d.  Vernard GamblesVeronda Tyrese Capriotti, PharmD, BCPS 10/31/2013 7:31 AM

## 2013-10-31 NOTE — Procedures (Signed)
Patient was seen on dialysis and the procedure was supervised.  BFR 400  Via AVF BP is  138/51.   Patient appears to be tolerating treatment well- so far so good  [name] A 10/31/2013

## 2013-10-31 NOTE — Progress Notes (Signed)
Patient Name: [name] Date of Encounter: 10/31/2013  Principal Problem:   Clostridium difficile colitis Active Problems:   End stage renal disease   DM (diabetes mellitus)   HTN (hypertension)   Aortic stenosis, moderate at cath 07/11/13   PAF (paroxysmal atrial fibrillation)   Atrial fibrillation   Cardiac arrest   Sinus bradycardia   Aortic stenosis    SUBJECTIVE: Had diarrhea PTA, developed it again last pm, positive for C. Diff.  OBJECTIVE Filed Vitals:   10/31/13 0450 10/31/13 0545 10/31/13 0812 10/31/13 1119  BP:  112/42 107/38 116/43  Pulse:  51 50 50  Temp:   98.5 F (36.9 C) 99.1 F (37.3 C)  TempSrc:   Oral Oral  Resp:  20 22 19   Height:      Weight: 119 lb 4.3 oz (54.1 kg)     SpO2:  96% 99% 100%    Intake/Output Summary (Last 24 hours) at 10/31/13 1246 Last data filed at 10/31/13 1610  Gross per 24 hour  Intake    243 ml  Output      0 ml  Net    243 ml   Filed Weights   10/29/13 1400 10/30/13 0343 10/31/13 0450  Weight: 119 lb 4.3 oz (54.1 kg) 119 lb (53.978 kg) 119 lb 4.3 oz (54.1 kg)    PHYSICAL EXAM General: Well developed, well nourished, female in no acute distress. Head: Normocephalic, atraumatic.  Neck: Supple without bruits, JVD elevated. Lungs:  Resp regular and unlabored, rales bases. Heart: RRR, S1, S2, no S3, S4, 2-3/6 AS murmur; no rub. Abdomen: Soft, diffusely tender, slightly distended, BS + x 4.  Extremities: No clubbing, cyanosis, no edema.  Neuro: Alert and oriented X 3. Moves all extremities spontaneously. Psych: Normal affect.  LABS: CBC: Recent Labs  10/29/13 0944  10/30/13 0645 10/31/13 0845  WBC 19.2*  --  12.8* 11.8*  NEUTROABS 16.8*  --   --  9.2*  HGB 10.1*  < > 9.0* 9.1*  HCT 30.9*  < > 27.1* 26.8*  MCV 95.4  --  93.8 90.5  PLT 170  --  184 307  < > = values in this interval not displayed.  Basic Metabolic Panel: Recent Labs  10/29/13 1635 10/30/13 0645 10/31/13 0845  NA  --  142 138  K   --  3.6* 3.7  CL  --  99 96  CO2  --  26 20  GLUCOSE  --  90 85  BUN  --  33* 47*  CREATININE  --  4.64* 5.43*  CALCIUM  --  9.3 9.1  MG 2.0  --   --   PHOS  --  5.5*  --    Liver Function Tests: Recent Labs  10/29/13 0944 10/30/13 0645  AST 27  --   ALT 20  --   ALKPHOS 157*  --   BILITOT 0.4  --   PROT 7.2  --   ALBUMIN 3.5 2.8*   Cardiac Enzymes: Recent Labs  10/29/13 1635 10/29/13 2145  TROPONINI <0.30 <0.30    Recent Labs  10/29/13 0953  TROPIPOC 0.04   Thyroid Function Tests: Recent Labs  10/29/13 1635  TSH 1.710    TELE:   SR, S Lesko w/ episode narrow-complex tachycardia  Current Medications:  . amiodarone  200 mg Oral Daily  . aspirin  81 mg Oral Daily  . atorvastatin  40 mg Oral q1800  . clopidogrel  75 mg Oral  Q breakfast  . darbepoetin (ARANESP) injection - DIALYSIS  12.5 mcg Intravenous Q Wed-HD  . doxercalciferol  2 mcg Intravenous Q M,W,F-HD  . heparin  5,000 Units Subcutaneous 3 times per day  . insulin aspart  0-9 Units Subcutaneous TID WC  . sodium chloride  3 mL Intravenous Q12H  . vancomycin  125 mg Oral QID      ASSESSMENT AND PLAN:    Cardiac arrest: Dr. Johney FrameAllred w/ EP saw 03/03:  The patient presents following PEA arrest. On telemetry here, she has sinus rhythm 50s. She did not require defibrillation with her recent event. I share Dr Gwenlyn PerkingGoldsboroughs suspicion that in the setting of diarrhea that hypotension/ decreased preload with dialysis and severe AS are likely the cause for her event. I think that a primary bradycardic event is less likely. Given her multiple advanced medical problems, her prognosis is poor. She is not a candidate for any EP procedures at this time.  She would like CPR/defib if necessary but would not like intubation in the future. I think that palliative consultation to discuss goals of care could be beneficial.  03/04: Pt with episode tachycardia and some sinus bradycardia 50s. No symptoms. Currently on  low-dose amio 200 mg qd and no beta blocker. Not a candidate for EP procedures, so continue to follow on telemetry.   Otherwise, see discussion above.    Aortic stenosis, moderate at cath 07/11/13    PAF (paroxysmal atrial fibrillation)   Atrial fibrillation   Sinus bradycardia  Per primary MD, Renal team Principal Problem:   Clostridium difficile colitis Active Problems:   End stage renal disease   DM (diabetes mellitus)   HTN (hypertension)    Signed, Theodore Demarkhonda Barrett , PA-C 12:46 PM 10/31/2013   Patient seen and examined. Agree with assessment and plan. Currently undergoing hemodialysis. No chest pain. HR 55 - 80 with  Sinus and ? PAF. Will f/u ECG later today.   Lennette Biharihomas A. Kelly, MD, [facility] CorporationFACC 10/31/2013 2:53 PM

## 2013-10-31 NOTE — Progress Notes (Signed)
PT Cancellation Note  Patient Details Name: [name] MRN: [id number] DOB: 18-Jul-1941   Cancelled Treatment:    Reason Eval/Treat Not Completed: Patient at procedure or test/unavailable (in HD)   Nasif Bos B. Luisantonio Adinolfi, PT, DPT [phone]   10/31/2013, 3:23 PM

## 2013-11-01 LAB — BASIC METABOLIC PANEL
BUN: 17 mg/dL (ref 6–23)
CALCIUM: 8.7 mg/dL (ref 8.4–10.5)
CO2: 28 mEq/L (ref 19–32)
CREATININE: 2.83 mg/dL — AB (ref 0.50–1.10)
Chloride: 97 mEq/L (ref 96–112)
GFR calc Af Amer: 18 mL/min — ABNORMAL LOW (ref >90)
GFR, EST NON AFRICAN AMERICAN: 16 mL/min — AB (ref >90)
Glucose, Bld: 190 mg/dL — ABNORMAL HIGH (ref 70–99)
Potassium: 3.8 mEq/L (ref 3.7–5.3)
SODIUM: 138 meq/L (ref 137–147)

## 2013-11-01 LAB — CBC WITH DIFFERENTIAL/PLATELET
Basophils Absolute: 0 10*3/uL (ref 0.0–0.1)
Basophils Relative: 0 % (ref 0–1)
EOS PCT: 3 % (ref 0–5)
Eosinophils Absolute: 0.3 10*3/uL (ref 0.0–0.7)
HEMATOCRIT: 25.7 % — AB (ref 36.0–46.0)
Hemoglobin: 8.5 g/dL — ABNORMAL LOW (ref 12.0–15.0)
LYMPHS ABS: 1 10*3/uL (ref 0.7–4.0)
Lymphocytes Relative: 13 % (ref 12–46)
MCH: 30.7 pg (ref 26.0–34.0)
MCHC: 33.1 g/dL (ref 30.0–36.0)
MCV: 92.8 fL (ref 78.0–100.0)
MONO ABS: 1.1 10*3/uL — AB (ref 0.1–1.0)
Monocytes Relative: 13 % — ABNORMAL HIGH (ref 3–12)
NEUTROS ABS: 5.6 10*3/uL (ref 1.7–7.7)
Neutrophils Relative %: 70 % (ref 43–77)
Platelets: 232 10*3/uL (ref 150–400)
RBC: 2.77 MIL/uL — ABNORMAL LOW (ref 3.87–5.11)
RDW: 15.8 % — ABNORMAL HIGH (ref 11.5–15.5)
WBC: 8 10*3/uL (ref 4.0–10.5)

## 2013-11-01 LAB — GLUCOSE, CAPILLARY
GLUCOSE-CAPILLARY: 158 mg/dL — AB (ref 70–99)
Glucose-Capillary: 185 mg/dL — ABNORMAL HIGH (ref 70–99)
Glucose-Capillary: 213 mg/dL — ABNORMAL HIGH (ref 70–99)
Glucose-Capillary: 215 mg/dL — ABNORMAL HIGH (ref 70–99)
Glucose-Capillary: 174 mg/dL — ABNORMAL HIGH (ref 70–99)

## 2013-11-01 NOTE — Progress Notes (Signed)
Patient Name: [name] Date of Encounter: 11/01/2013  Principal Problem:   Clostridium difficile colitis Active Problems:   End stage renal disease   DM (diabetes mellitus)   HTN (hypertension)   Aortic stenosis, moderate at cath 07/11/13   PAF (paroxysmal atrial fibrillation)   Atrial fibrillation   Cardiac arrest   Sinus bradycardia   Aortic stenosis    SUBJECTIVE: No chest pain or palps, tolerated HD well.  OBJECTIVE Filed Vitals:   10/31/13 2356 11/01/13 0325 11/01/13 0700 11/01/13 0737  BP: 104/35 113/41  122/44  Pulse: 60 56  59  Temp: 99.5 F (37.5 C) 99.1 F (37.3 C) 98.4 F (36.9 C)   TempSrc: Oral Oral Oral   Resp: 21 17  20   Height:      Weight:  110 lb 10.7 oz (50.2 kg)    SpO2: 99% 100%  99%    Intake/Output Summary (Last 24 hours) at 11/01/13 0951 Last data filed at 11/01/13 0735  Gross per 24 hour  Intake    120 ml  Output   2500 ml  Net  -2380 ml   Filed Weights   10/31/13 1322 10/31/13 1722 11/01/13 0325  Weight: 123 lb 0.3 oz (55.8 kg) 114 lb 3.2 oz (51.8 kg) 110 lb 10.7 oz (50.2 kg)    PHYSICAL EXAM General: Well developed, well nourished, female in no acute distress. Head: Normocephalic, atraumatic.  Neck: Supple without bruits, JVD 9 cm. Lungs:  Resp regular and unlabored, some basilar rales. Heart: RRR, S1, S2, no S3, S4, 2-3/6 murmur; no rub. Abdomen: Soft, non-tender, non-distended, BS + x 4.  Extremities: No clubbing, cyanosis, no edema.  Neuro: Alert and oriented X 3. Moves all extremities spontaneously. Psych: Normal affect.  LABS: CBC: Recent Labs  10/31/13 0845 11/01/13 0230  WBC 11.8* 8.0  NEUTROABS 9.2* 5.6  HGB 9.1* 8.5*  HCT 26.8* 25.7*  MCV 90.5 92.8  PLT 307 232   Basic Metabolic Panel: Recent Labs  10/29/13 1635  10/30/13 0645 10/31/13 0845 11/01/13 0230  NA  --   --  142 138 138  K  --   --  3.6* 3.7 3.8  CL  --   --  99 96 97  CO2  --   < > 26 20 28   GLUCOSE  --   --  90 85 190*  BUN   --   --  33* 47* 17  CREATININE  --   --  4.64* 5.43* 2.83*  CALCIUM  --   < > 9.3 9.1 8.7  MG 2.0  --   --   --   --   PHOS  --   --  5.5*  --   --   < > = values in this interval not displayed. Liver Function Tests: Recent Labs  10/30/13 0645  ALBUMIN 2.8*   Cardiac Enzymes: Recent Labs  10/29/13 1635 10/29/13 2145  TROPONINI <0.30 <0.30    Recent Labs  10/29/13 0953  TROPIPOC 0.04   BNP: Pro B Natriuretic peptide (BNP)  Date/Time Value Ref Range Status  10/22/2013  2:39 PM >70000.0* 0 - 125 pg/mL Final   Thyroid Function Tests: Recent Labs  10/29/13 1635  TSH 1.710   TELE:  SR, sinus [name], occ PVCs, 1 episode self-limiting narrow tachycardia    Current Medications:  . amiodarone  200 mg Oral Daily  . aspirin  81 mg Oral Daily  . atorvastatin  40 mg Oral q1800  .  clopidogrel  75 mg Oral Q breakfast  . darbepoetin (ARANESP) injection - DIALYSIS  12.5 mcg Intravenous Q Wed-HD  . doxercalciferol  2 mcg Intravenous Q M,W,F-HD  . heparin  5,000 Units Subcutaneous 3 times per day  . insulin aspart  0-9 Units Subcutaneous TID WC  . sodium chloride  3 mL Intravenous Q12H  . vancomycin  125 mg Oral QID      ASSESSMENT AND PLAN: 73 yo female w/ hx CABG 2011, PAF, atrial tach, AS, ESRD on HD, EF 60-65%. D/c 02/28 after amio increased for PAF during HD and thoracentesis (1L out). Cardiac arrest on 03/02 at her first HD appt after d/c.  Cardiac arrest: Dr. Johney Baker w/ EP saw 03/03: The patient presents following PEA arrest. On telemetry here, she has sinus rhythm 50s. She did not require defibrillation with her recent event. I share Dr [name]'s suspicion that in the setting of diarrhea, hypotension/ decreased preload with dialysis and severe AS are likely the cause for her event. I think that a primary bradycardic event is less likely. Given her multiple advanced medical problems, her prognosis is poor. She is not a candidate for any EP procedures at this time.  She  would like CPR/defib if necessary but would not like intubation in the future. I think that palliative consultation to discuss goals of care could be beneficial.   03/04: Pt with episode tachycardia and some sinus bradycardia 50s. No symptoms. Currently on low-dose amio 200 mg qd and no beta blocker.Continue to follow on telemetry.   03/05: Episode of tachycardia (?PAT), self-limiting, no significant bradycardia on current Rx.  Otherwise, per IM/Renal. No further cardiac eval, see discussion above.  Principal Problem:   Clostridium difficile colitis Active Problems:   End stage renal disease   DM (diabetes mellitus)   HTN (hypertension)   Aortic stenosis, moderate at cath 07/11/13   PAF (paroxysmal atrial fibrillation)   Atrial fibrillation   Cardiac arrest   Sinus bradycardia   Aortic stenosis   Signed, [name] , PA-C 9:51 AM 11/01/2013    Patient seen and examined. Agree with assessment and plan. Currently in NSR in 60's with rare PVC. ECG sinus rhthym with RBBB and repolarization changes and small Q wave in III. No CHR findings on exam. Diarrhea slightly improved.   Lennette Biharihomas A. Win Guajardo, MD, [facility] HospitalFACC 11/01/2013 1:26 PM

## 2013-11-01 NOTE — Evaluation (Signed)
Physical Therapy Evaluation Patient Details Name: [name] MRN: [id number] DOB: [birth year] Today's Date: 11/01/2013 Time: 4098-1191 PT Time Calculation (min): 25 min  PT Assessment / Plan / Recommendation History of Present Illness  Patient is a 73 year old woman with history of coronary artery disease status post CABG and PCI, recently hospitalized on the cardiology service with chest pain and rapid atrial fibrillation and started on amiodarone, moderate to severe aortic stenosis, end-stage renal disease on hemodialysis, atrial fibrillation, and other problems as outlined in the medical history, who reportedly became unresponsive and pulseless during hemodialysis today.  Clinical Impression  Pt adm due to the above. Presents with overall generalized weakness, balance deficits and decreased functional mobility. Pt to benefit from skilled PT to address deficits listed below (see PT problem list). Pt reports many recent falls at home and has ben requiring increased (A) from family. Family is able to provide 24/7 (A) but pt may benefit from [facility] aide to (A) with ADLs. Pt agreeable to HHPT at this time. Also recommending that pt ambulate with RW at all times upon acute D/C to reduce risk of falls.     PT Assessment  Patient needs continued PT services    Follow Up Recommendations  Home health PT;Supervision/Assistance - 24 hour (pt agreeable to HHPT this admission)    Does the patient have the potential to tolerate intense rehabilitation      Barriers to Discharge   pt reports she has 24/7 (A)     Equipment Recommendations  None recommended by PT    Recommendations for Other Services OT consult   Frequency Min 3X/week    Precautions / Restrictions Precautions Precautions: Fall Precaution Comments: reports multiple recent falls at home  Restrictions Weight Bearing Restrictions: No   Pertinent Vitals/Pain Vitals stable.       Mobility  Bed Mobility General bed mobility comments:  not assessed; pt sitting up in chair and returned to chair  Transfers Overall transfer level: Needs assistance Equipment used: Rolling walker (2 wheeled) Transfers: Sit to/from Stand Sit to Stand: Min guard General transfer comment: slightly unsteady with sit to stand; min guard for safety and to steady; cues for hand placement and safety  Ambulation/Gait Ambulation/Gait assistance: Min guard Ambulation Distance (Feet): 40 Feet Assistive device: Rolling walker (2 wheeled) Gait Pattern/deviations: Shuffle;Narrow base of support Gait velocity: very decreased Gait velocity interpretation: Below normal speed for age/gender General Gait Details: pt has difficulty managing RW around obstacles; cues for sequencing and safety; min guard to steady          PT Diagnosis: Abnormality of gait;Generalized weakness  PT Problem List: Decreased strength;Decreased balance;Decreased activity tolerance;Decreased mobility;Decreased knowledge of use of DME;Decreased safety awareness PT Treatment Interventions: DME instruction;Gait training;Functional mobility training;Stair training;Therapeutic activities;Therapeutic exercise;Balance training;Neuromuscular re-education;Patient/family education     PT Goals(Current goals can be found in the care plan section) Acute Rehab PT Goals Patient Stated Goal: to go home PT Goal Formulation: With patient Time For Goal Achievement: 11/15/13 Potential to Achieve Goals: Good  Visit Information  Last PT Received On: 11/01/13 Assistance Needed: +1 History of Present Illness: Patient is a 73 year old woman with history of coronary artery disease status post CABG and PCI, recently hospitalized on the cardiology service with chest pain and rapid atrial fibrillation and started on amiodarone, moderate to severe aortic stenosis, end-stage renal disease on hemodialysis, atrial fibrillation, and other problems as outlined in the medical history, who reportedly became  unresponsive and pulseless during hemodialysis today.  Prior Functioning  Home Living Family/patient expects to be discharged to:: Private residence Living Arrangements: Children Available Help at Discharge: Friend(s);Available 24 hours/day Type of Home: House Home Access: Stairs to enter Entergy CorporationEntrance Stairs-Number of Steps: 6 Entrance Stairs-Rails: Right;Left Home Layout: One level Home Equipment: Walker - 4 wheels;Bedside commode;Shower seat;Cane - single point Prior Function Level of Independence: Needs assistance ADL's / Homemaking Assistance Needed: pt reports she has been needing help with bathing; unable to stand long periods of time; family helps with cooking and cleaning  Comments: ambulates with cane or RW  Communication Communication: No difficulties Dominant Hand: Right    Cognition  Cognition Arousal/Alertness: Awake/alert Behavior During Therapy: WFL for tasks assessed/performed Overall Cognitive Status: Within Functional Limits for tasks assessed    Extremity/Trunk Assessment Upper Extremity Assessment Upper Extremity Assessment: Defer to OT evaluation Lower Extremity Assessment Lower Extremity Assessment: Generalized weakness Cervical / Trunk Assessment Cervical / Trunk Assessment: Kyphotic   Balance Balance Overall balance assessment: History of Falls;Needs assistance Standing balance support: During functional activity;Bilateral upper extremity supported Standing balance-Leahy Scale: Fair Standing balance comment: bil UE supported; requires (A) to perform perineal hygiene in standing; tolerated static standing ~2 min  General Comments General comments (skin integrity, edema, etc.): pt c/o weakness while ambulating  End of Session PT - End of Session Equipment Utilized During Treatment: Gait belt Activity Tolerance: Patient tolerated treatment well Patient left: in chair;with call bell/phone within reach Nurse Communication: Mobility status  GP      Donell SievertWest, Kinneth Fujiwara N, [place]PT [phone] 11/01/2013, 11:33 AM

## 2013-11-01 NOTE — Consult Note (Signed)
Multi-disciplinary Valve Team  Patient ID: [name] MRN: [id number] DOB/AGE: [birth year] 73 y.o.  Admit date: 10/29/2013 Primary Cardiologist: Daphene Jaeger Reason for Consultation: Consideration for TAVR with severe AS  HPI: 73 y.o. AAF with multiple medical problems including history of  CAD s/p CABG X 3 in 2011, severe COPD, ESRD on HD, DM, chronic CHF, PAF, non-healing leg wounds, PAD, HTN, HLD and severe aortic valve stenosis who I am asked to evaluate as part of the multi-disciplinary valve team for consideration for TAVR.  She was hospitalized 10/22/13-10/27/13 with PAF and chest pain, pleural effusion requiring thoracentesis on 10/27/13 with 1 L removed. During that admission, she had repeated problems with atrial tachycardia versus atypical atrial flutter during hemodialysis. She was started on amiodarone. She was readmitted 10/29/13 after a PEA arrest while on dialysis. CPR was initiated and an AED was applied. She required approximately 10 minutes of CPR. She did not require shock and did not get any medications. After she regained pulses, she was conscious and alert. She had no complaints of chest pain or shortness of breath.  The AED was interrogated afterwards and showed an idioventricular rhythm. Over the last three days, she has developed diarrhea and been found to have C. Diff colitis. She has been seen by Dr. Johney Frame with EP who has not recommended any advanced EP therapies at this time. He has suggested palliative care consultation given her multiple co-morbidities and poor overall prognosis. Her coronary history includes CABG 2011. She was admitted with a NSTEMI 07/06/13 and cath per Dr. Tresa Endo showed diffusely calcified severely diseased right coronary artery with an occluded vein graft that supplied the PDA, and occluded vein graft which supplied the obtuse marginal branch of the circumflex vessel  and a patent LIMA to the LAD. She was seen by CT surgery and was not felt to be a candidate for redo CABG due to her multiple medical issues and her porcelain aorta. Overlapping DES were placed in the RCA following rotational atherectomy. Last cath 10/08/13 in setting of NSTEMI with stable disease. Admitted 10/22/13 with atrial fib with RVR, mild troponin elevation and chest pain. She converted to NSR but had recurrence of atrial fib while on dialysis causing chest pain. No cath was repeated. She has been known to have aortic valve stenosis that has been moderate by previous echocardiograms. Last echo 10/06/13 with LVEF=60-65%, severe AS with mean gradient of 43 mm Hg, peak gradient 81 mmHg, AVA 0.45.   She tells me today that she is feeling better. She is still having diarrhea. No chest pain or SOB. No LE edema. She is limited at home by weakness and dyspnea. She no longer performs any chores around the house but lives alone. She is able to "wash up" without help but does very little during the day. She is on dialysis three times per week and has tolerated this well until the last month. Now with frequent dizziness and chest pain while on dialysis.   Past Medical History  Diagnosis Date  . CHF (congestive heart failure)     diast SHF, RV failure, pulm HTN  . Diabetes mellitus   . ESRD on hemodialysis     HD MWF in [place]   . Hyperlipidemia   . Anemia   . Hypertension   . COPD (chronic obstructive pulmonary disease)     not on home oxygen  . Peripheral vascular disease   . Coronary artery disease     Hx CABG. Most  recent cath 07/06/13 for CP episode showed occlusive graft disease and moderate AS; pt was not a candidate for repeat surgery and a complex PCI/rotablator/stenting procedure of calcified native RCA was done by cardiology  . Hx of echocardiogram 03/2011    showed a mean gradient that had increased to 41 and a peak gradient that had increase to 70, she has moderate tricuspid regurigitation,  mild to moderate mitral regurigitation, and significant LA dilation.  Marland Kitchen PAF (paroxysmal atrial fibrillation)   . Chronic right-sided heart failure 10/25/2013  . Aortic stenosis     Family History  Problem Relation Age of Onset  . Heart disease Mother     History   Social History  . Marital Status: Widowed    Spouse Name: N/A    Number of Children: N/A  . Years of Education: N/A   Occupational History  . Retired    Social History Main Topics  . Smoking status: Former Smoker -- 0.50 packs/day    Types: Cigarettes    Start date: 06/19/1973  . Smokeless tobacco: Never Used  . Alcohol Use: No  . Drug Use: No  . Sexual Activity: No   Other Topics Concern  . Not on file   Social History Narrative   The patient currently lives at home with a friend "Trinna Post", who helps her around the house.  The patient manages her own medications, using a pill box.    Past Surgical History  Procedure Laterality Date  . Coronary artery bypass graft  09/2009    x3 by Dr Alla German with LIMA to the LAD, a vein to the circumflex and a vien to the PDA.  Marland Kitchen Abdominal hysterectomy    . Thoracentesis  11-2009  . Av fistula placement  01-12-2008    left upper arm  . Cardiac catheterization  10/15/2009    which showed low normal LV function with mild inferior hypocontractility.    Allergies  Allergen Reactions  . Codeine Rash  . Sulfa Antibiotics Other (See Comments)    unknown   Hospital Meds:  . amiodarone  200 mg Oral Daily  . aspirin  81 mg Oral Daily  . atorvastatin  40 mg Oral q1800  . clopidogrel  75 mg Oral Q breakfast  . darbepoetin (ARANESP) injection - DIALYSIS  12.5 mcg Intravenous Q Wed-HD  . doxercalciferol  2 mcg Intravenous Q M,W,F-HD  . heparin  5,000 Units Subcutaneous 3 times per day  . insulin aspart  0-9 Units Subcutaneous TID WC  . sodium chloride  3 mL Intravenous Q12H  . vancomycin  125 mg Oral QID   Review of systems complete and found to be negative unless listed  above   Physical Exam: Blood pressure 105/43, pulse 62, temperature 98.4 F (36.9 C), temperature source Oral, resp. rate 28, height 5\' 4"  (1.626 m), weight 110 lb 10.7 oz (50.2 kg), SpO2 92.00%.    General: Well developed, well nourished, NAD  HEENT: OP clear, mucus membranes moist  SKIN: warm, dry. No rashes.  Neuro: No focal deficits  Musculoskeletal: Muscle strength 5/5 all ext  Psychiatric: Mood and affect normal  Neck: No JVD, no carotid bruits, no thyromegaly, no lymphadenopathy.  Lungs:Clear bilaterally, no wheezes, rhonci, crackles  Cardiovascular: Regular rate and rhythm. Loud systolic murmur. No gallops or rubs.  Abdomen:Soft. Bowel sounds present. Non-tender.  Extremities: No lower extremity edema. Pulses are 2 + in the bilateral DP/PT.   Labs:   Lab Results  Component Value Date   WBC  8.0 11/01/2013   HGB 8.5* 11/01/2013   HCT 25.7* 11/01/2013   MCV 92.8 11/01/2013   PLT 232 11/01/2013    Recent Labs Lab 10/29/13 0944  11/01/13 0230  NA  --   < > 138  K  --   < > 3.8  CL  --   < > 97  CO2  --   < > 28  BUN  --   < > 17  CREATININE  --   < > 2.83*  CALCIUM  --   < > 8.7  PROT 7.2  --   --   BILITOT 0.4  --   --   ALKPHOS 157*  --   --   ALT 20  --   --   AST 27  --   --   GLUCOSE  --   < > 190*  < > = values in this interval not displayed. Lab Results  Component Value Date   CKTOTAL 45 11/11/2010   CKMB 2.2 11/11/2010   TROPONINI <0.30 10/29/2013    Cardiac cath 10/08/13: The left main coronary artery is a large vessel which is widely patent..  The left anterior descending artery is a large vessel which wraps around the apex. There several diagonal vessels proximally which are medium size and moderately diseased. There is a larger diagonal off the mid LAD which is patent. After this large diagonal, there is a focal 70% mid LAD stenosis. There is some mild competitive flow noted in distal LAD.  The LIMA to LAD is patent..  The left circumflex artery is a large  vessel proximally. There is an OM 1 which is occluded in the midportion at the distal edge of an apparent stent. There are minimal collaterals to this distribution.  The SVG to circumflex system is occluded.  The SVG to RCA system is occluded.  The right coronary artery is a large dominant vessel. There are multiple stents throughout the vessel. The posterior lateral artery is medium-sized and patent. The posterior descending artery is medium size with moderate disease. In the mid vessel, there is a portion of the stented segment which appears under dilated. In looking back at the old report, this area was post dilated with a 3.75 balloon to high pressure. Due to calcification, the stent did not expand any more. There is TIMI 3 flow. In the cranial view, the unemployment does not appear to be as significant.  Echo 10/06/13: Left ventricle: The cavity size was normal. There was moderate concentric hypertrophy. Systolic function was normal. The estimated ejection fraction was in the range of 60% to 65%. Wall motion was normal; there were no regional wall motion abnormalities. Doppler parameters are consistent with restrictive physiology, indicative of decreased left ventricular diastolic compliance and/or increased left atrial pressure. - Aortic valve: There was moderate to severe stenosis. Valve area: 0.45cm^2(VTI). Valve area: 0.45cm^2 (Vmax). - Mitral valve: Calcified annulus. Mildly thickened, mildly calcified leaflets . Mild regurgitation directed centrally. Valve area by continuity equation (using LVOT flow): 0.91cm^2. - Left atrium: The atrium was mildly dilated. - Right ventricle: The cavity size was mildly dilated. Systolic function was moderately to severely reduced. - Right atrium: The atrium was mildly dilated. - Tricuspid valve: Moderate-severe regurgitation directed centrally. The acceleration rate of the regurgitant jet was reduced, consistent with a low dP/dt. - Pulmonary  arteries: PA peak pressure: 52mm Hg (S).  EKG: sinus, RBBB  ASSESSMENT AND PLAN: 73 y.o. AAF with multiple medical problems including history  of  CAD s/p CABG X 3 in 2011, COPD, ESRD on HD, DM, chronic CHF, PAF, non-healing leg wounds, PAD, HTN, HLD and severe aortic valve stenosis who I am asked to evaluate as part of the multi-disciplinary valve team for consideration for TAVR. She has had multiple recent admissions with NSTEMI, cardiac arrest in the setting of hypovolemia and diarrhea, multiple episodes of atrial fibrillation, chest pain on dialysis, ongoing anemia, bright red blood per rectum felt to be hemorrhoidal bleeding. Echo 10/06/13  with mean gradient of 43 mm Hg across the aortic valve with AVA of 0.45 cm2. Her aortic stenosis is likely now severe. It is also likely that her aortic stenosis is a contributing factor in recent admissions with chest pain, NSTEMI, dizziness, PEA arrest in setting of hypovolemia. She is limited in her activities of daily living and is barely functional. She is known to have COPD but she denies this. There are no recent PFTs on file.   I have spent 30 minutes reviewing aortic stenosis and the natural history with the patient. She has no family present today. She will not be a candidate for open surgical procedure/traditional aortic valve replacement. She is interested in pursuing TAVR if it is felt to be an option.  I will review her case with the TAVR team. If we agree to move forward, she will need PFTs, CTA of the chest/abd/pelvis and cardiac CT. I will ask Dr. Cornelius Moras or Dr. Laneta Simmers to see her tomorrow to provide more insight from a surgical standpoint.    Signed: MCALHANY,CHRISTOPHER 11/01/2013, 5:33 PM

## 2013-11-01 NOTE — Progress Notes (Signed)
Subjective: Per nursing, pt has ongoing loose, malodorous diarrhea, unchanged in frequency, about every hour, without BRBPR.  Patient reports abdominal pain improved, although still had rectal pain, which is chronic for her and improves with vaseline.  She still has some LLQ abdominal pain, which she describes as a 'knot'.  Objective:  Temp:  [98 F (36.7 C)-99.5 F (37.5 C)] 98.4 F (36.9 C) (03/05 0700) Pulse Rate:  [53-62] 59 (03/05 0737) Resp:  [17-26] 20 (03/05 0737) BP: (99-138)/(35-88) 122/44 mmHg (03/05 0737) SpO2:  [93 %-100 %] 99 % (03/05 0737) Weight:  [50.2 kg (110 lb 10.7 oz)-55.8 kg (123 lb 0.3 oz)] 50.2 kg (110 lb 10.7 oz) (03/05 0325)  Weight change: 1.7 kg (3 lb 12 oz)  Intake/Output Summary (Last 24 hours) at 11/01/13 1150 Last data filed at 11/01/13 0735  Gross per 24 hour  Intake    120 ml  Output   2500 ml  Net  -2380 ml   Physical Exam General: Resting comfortably in no acute distress. Eyes: EOMI.  Conjunctivae normal, sclerae clear and anicteric. HENT: Oropharyngeal mucous membranes pink and moist. Neck: Supple, no LAD. Pulm: Normal respiratory rate and effort.  CTAB, no wheezes or crackles. Cardio: RRR, S1 and S2 with a harsh, holosystolic murmur Abdomen: Normoactive bowel sounds. Soft, non-distended, mild TTP in LLQ. Extremities: Warm and well perfused, with DP pulses 1+ bilaterally.  No edema or cyanosis. Neuro: Alert, oriented, and appropriate.  CN II-XII grossly intact.  Lab Results:  Results for orders placed during the hospital encounter of 10/29/13 (from the past 24 hour(s))  GLUCOSE, CAPILLARY     Status: Abnormal   Collection Time    10/31/13  6:13 PM      Result Value Ref Range   Glucose-Capillary 124 (*) 70 - 99 mg/dL  GLUCOSE, CAPILLARY     Status: Abnormal   Collection Time    10/31/13  9:35 PM      Result Value Ref Range   Glucose-Capillary 185 (*) 70 - 99 mg/dL   Comment 1 Notify RN     Comment 2 Documented in Chart    CBC  WITH DIFFERENTIAL     Status: Abnormal   Collection Time    11/01/13  2:30 AM      Result Value Ref Range   WBC 8.0  4.0 - 10.5 K/uL   RBC 2.77 (*) 3.87 - 5.11 MIL/uL   Hemoglobin 8.5 (*) 12.0 - 15.0 g/dL   HCT 09.825.7 (*) 11.936.0 - 14.746.0 %   MCV 92.8  78.0 - 100.0 fL   MCH 30.7  26.0 - 34.0 pg   MCHC 33.1  30.0 - 36.0 g/dL   RDW 82.915.8 (*) 56.211.5 - 13.015.5 %   Platelets 232  150 - 400 K/uL   Neutrophils Relative % 70  43 - 77 %   Neutro Abs 5.6  1.7 - 7.7 K/uL   Lymphocytes Relative 13  12 - 46 %   Lymphs Abs 1.0  0.7 - 4.0 K/uL   Monocytes Relative 13 (*) 3 - 12 %   Monocytes Absolute 1.1 (*) 0.1 - 1.0 K/uL   Eosinophils Relative 3  0 - 5 %   Eosinophils Absolute 0.3  0.0 - 0.7 K/uL   Basophils Relative 0  0 - 1 %   Basophils Absolute 0.0  0.0 - 0.1 K/uL  BASIC METABOLIC PANEL     Status: Abnormal   Collection Time    11/01/13  2:30 AM  Result Value Ref Range   Sodium 138  137 - 147 mEq/L   Potassium 3.8  3.7 - 5.3 mEq/L   Chloride 97  96 - 112 mEq/L   CO2 28  19 - 32 mEq/L   Glucose, Bld 190 (*) 70 - 99 mg/dL   BUN 17  6 - 23 mg/dL   Creatinine, Ser 1.61 (*) 0.50 - 1.10 mg/dL   Calcium 8.7  8.4 - 09.6 mg/dL   GFR calc non Af Amer 16 (*) >90 mL/min   GFR calc Af Amer 18 (*) >90 mL/min  GLUCOSE, CAPILLARY     Status: Abnormal   Collection Time    11/01/13  7:38 AM      Result Value Ref Range   Glucose-Capillary 158 (*) 70 - 99 mg/dL   Micro Results: Recent Results (from the past 240 hour(s))  MRSA PCR SCREENING     Status: None   Collection Time    10/29/13  1:59 PM      Result Value Ref Range Status   MRSA by PCR NEGATIVE  NEGATIVE Final   Comment:            The GeneXpert MRSA Assay (FDA     approved for NASAL specimens     only), is one component of a     comprehensive MRSA colonization     surveillance program. It is not     intended to diagnose MRSA     infection nor to guide or     monitor treatment for     MRSA infections.  CLOSTRIDIUM DIFFICILE BY PCR      Status: Abnormal   Collection Time    10/30/13  2:55 PM      Result Value Ref Range Status   C difficile by pcr POSITIVE (*) NEGATIVE Final   Comment: CRITICAL RESULT CALLED TO, READ BACK BY AND VERIFIED WITH:     STANFIELD RN 17:20 10/30/13 ([email or web address])   Studies/Results: No results found.  Medications: I have reviewed the patient's current medications. Scheduled Meds: . amiodarone  200 mg Oral Daily  . aspirin  81 mg Oral Daily  . atorvastatin  40 mg Oral q1800  . clopidogrel  75 mg Oral Q breakfast  . darbepoetin (ARANESP) injection - DIALYSIS  12.5 mcg Intravenous Q Wed-HD  . doxercalciferol  2 mcg Intravenous Q M,W,F-HD  . heparin  5,000 Units Subcutaneous 3 times per day  . insulin aspart  0-9 Units Subcutaneous TID WC  . sodium chloride  3 mL Intravenous Q12H  . vancomycin  125 mg Oral QID   Continuous Infusions:  PRN Meds:.acetaminophen, acetaminophen Assessment/Plan:  Ms. Perren is a 73 year old woman with a history of PAF, CAD, aortic stenosis, and ESRD on MWF hemodialysis, s/p recent discharge for Afib with RVR, who presents after receiving 10 minutes of CPR with ROSC after an apparent cardiac arrest during her dialysis session on 10/29/13. She does not remember the event; the AED reportedly showed that the patient was in a non-shockable, idioventricular pattern.   # C diff colitis - Pt with improved pain, but continued frequent stools after 24 hours on vancomycin.  Patient complains of rectal pain that is chronic for her and presumed due to external hemorrhoids, likely exacerbated by frequent stools - continue po vancomycin 125mg  4 times daily  - continue to monitor for improvement - consider hemorrhoid ointment after diarrhea improves  # Cardiac Event - putative cardiac arrest reported by HD center, though  no rhythm strips obtained at the time. Event appears more consistent with hypotension-induced syncope during dialysis in the setting of severe AS and likely volume depletion  from C difficile diarrhea. Less likely, event may represent either bradycardic arrest (HR in the 40s) vs arrythmia (history of Afib, CAD, prolonged QT). No current chest pain to suggest acute ischemia as an inciting factor, trop neg x3, EKG with no significant changes. TSH normal. EP evaluation concluded that primary bradycardia was unlikely, and that patient is not a candidate for EP procedures.  Patient tolerated HD well, with borderline hypotension post dialysis that has improved. -pt OK to move to a telemetry floor -cardiology consulted for management of severe preload dependent AS in setting of ESRD -hold metoprolol  -continue amiodarone 200mg  daily  -continue to monitor patient's blood pressure, and maintain preload with cautious IVF boluses as needed   # Severe Aortic Stenosis - Echo 10/06/13 showing EF 60-65%, but aortic valve area 0.45. 24 hr blood pressures ranging 107/38 - 121/41  -consulted cardiology for management recs -monitor volume status -avoid reducing preload   # ESRD - on MWF HD at [facility] dialysis center. Only received about 1 hour of her scheduled treatment 3/2. During admission, patient's BUN has decreased 47 -> 17, and Cr 5.4 -> 2.8 after HD yesterday.  K has been stable at 3.6-3.8. -daily weights -patient tolerated inpatient hemodialysis well yesterday, per nephrology.  -basic metabolic panels qAM   # Bright red blood per rectum - Intermittent blood and rectal pain with defecation for the last 3 months per pt report. Colonoscopy with Dr. Jennye Boroughs in 2009 showed external hemorroids, removed hyperplastic polyps with recommended 10 year follow up. This most likely represents hemorrhoidal bleeding (multiple hemorrhoids present on DRE) in the setting of increased stool frequency, vs diverticulosis. Hb currently stable at 9.1 (baseline 9-10).  -continue to trend CBC's daily  -likely no need for GI consult at this time, as Hb stable, but can consider if Hb drops   #  Paroxysmal Afib - recently discharged 2/28 after an admission for afib with RVR, discharged on amiodarone taper and metoprolol. Currently in sinus bradycardia; 24 hr HR ranging 49-53.  -hold metoprolol, continued amiodarone at decreased dose as above  -will not receive pacemaker per EP note   # QT prolongation - seen on prior EKG's, though mildly higher on admission compared to prior (QTc = 549 from 504). Etiology includes amiodarone usage vs electrolyte imbalance vs thyroid abnormality (though no history). TSH 1.7, Mg 2.0.  -continue telemetry  -patient already on anti-arrhythmic therapy and not candidate for EP procedure   # CAD - s/p CABG x3 in 2011 (LIMA to LAD, vein to circumflex, vein to PDA), s/p cath Nov 2014 (DES to RCA).  -continue aspirin, plavix, statin   # DM2 - On home Novolog 70/30 on a sliding scale, pt unclear on dosing. Per dietician, pt has not been taking insulin at home or checking blood glucose. Patient's A1c is 8.5. Patient has received 1 - 4 units of insulin total per day in hospital  -continue SSI while inpatient  -consider prescription for glucometer and strips on discharge   Dispo: Disposition is deferred at this time, awaiting improvement of C diff diarrhea and recommendations for management of AS.  Anticipated discharge in approximately 1-2 day(s).   This is a Psychologist, occupational Note.  The care of the patient was discussed with Dr. Glendell Docker and the assessment and plan formulated with their assistance.  Please see their attached  note for official documentation of the daily encounter.   LOS: 3 days   Jorge Mandril, Med Student 11/01/2013, 11:50 AM  I have read and agree with the med students plan. See my note for additional details  Angelina Sheriff, MD

## 2013-11-01 NOTE — Progress Notes (Signed)
Subjective:  remains bradycardic overnight and slightly hypotensive- no real symptoms from what I can tell.  Still having issues with diarrhea from C diff.  Had HD yesterday, removed 2500 without incident.   Objective Vital signs in last 24 hours: Filed Vitals:   10/31/13 1941 10/31/13 2000 10/31/13 2356 11/01/13 0325  BP: 99/43  104/35 113/41  Pulse: 62  60 56  Temp:  98.6 F (37 C) 99.5 F (37.5 C) 99.1 F (37.3 C)  TempSrc:  Oral Oral Oral  Resp: 26  21 17   Height:      Weight:    50.2 kg (110 lb 10.7 oz)  SpO2: 93%  99% 100%   Weight change: 1.7 kg (3 lb 12 oz)  Intake/Output Summary (Last 24 hours) at 11/01/13 16100812 Last data filed at 11/01/13 0735  Gross per 24 hour  Intake    123 ml  Output   2500 ml  Net  -2377 ml    Dialysis Orders: Dialysis: MWF [facility]  3.5h F160 52kg 3K/2.25 Bath L AVF Prof 4 Heparin none  Hectorol 2 Epo 2200 Hgb 10.5 2/18 sats 24% sat and ferritin 1600 in January; iPTH 433 1/28     Assessment/ Plan: Pt is a 73 y.o. yo female with ESRD who was admitted on 10/29/2013 with questionable arrest at dialysis 3/2   1. Cardiac arrest during HD today - cards seeing- amio decreased.  Are thinking that she does not need aggressive management of this as could have been situational.    Could  be related to a relatively large amount of fluid to take during HD on Monday ? 2. ESRD - MWF  Via AVF - using 4 K bath here - need to keep K up with hx of atrial fibrillation; have increased time to 4 hours due to complex cardiac issues and provide more gently volume removal- Next due tomorrow- tolerated well on Wednesday 3. Hypertension/volume - norvasc and diltiazem stopped last admission; on amio only now with hypotension 4. Anemia - ESA not given last admission; Hgb 9.0 - resume with HD Aranesp 12.5- question of GIB ? Per primary team 5. Metabolic bone disease - on hectorol and follow Ca levels- last phos 5.5- no binders currently, will check with HD  tomorrow 6. Nutrition - renal carb mod diet- malnutrition 7. DM - per primary 8. COPD 9. AS  10. C diff- new this admission with significant diarrhea- on PO vanc 11. Dispo- per primary team     [name] A    Labs: Basic Metabolic Panel:  Recent Labs Lab 10/26/13 1428  10/30/13 0645 10/31/13 0845 11/01/13 0230  NA 132*  < > 142 138 138  K 3.6*  < > 3.6* 3.7 3.8  CL 90*  < > 99 96 97  CO2 26  --  26 20 28   GLUCOSE 163*  < > 90 85 190*  BUN 13  < > 33* 47* 17  CREATININE 2.80*  < > 4.64* 5.43* 2.83*  CALCIUM 9.0  --  9.3 9.1 8.7  PHOS 4.1  --  5.5*  --   --   < > = values in this interval not displayed. Liver Function Tests:  Recent Labs Lab 10/26/13 1428 10/29/13 0944 10/30/13 0645  AST  --  27  --   ALT  --  20  --   ALKPHOS  --  157*  --   BILITOT  --  0.4  --   PROT  --  7.2  --  ALBUMIN 2.8* 3.5 2.8*   No results found for this basename: LIPASE, AMYLASE,  in the last 168 hours No results found for this basename: AMMONIA,  in the last 168 hours CBC:  Recent Labs Lab 10/27/13 0345 10/29/13 0944  10/30/13 0645 10/31/13 0845 11/01/13 0230  WBC 6.9 19.2*  --  12.8* 11.8* 8.0  NEUTROABS  --  16.8*  --   --  9.2* 5.6  HGB 9.1* 10.1*  < > 9.0* 9.1* 8.5*  HCT 28.6* 30.9*  < > 27.1* 26.8* 25.7*  MCV 95.0 95.4  --  93.8 90.5 92.8  PLT 193 170  --  184 307 232  < > = values in this interval not displayed. Cardiac Enzymes:  Recent Labs Lab 10/29/13 1635 10/29/13 2145  TROPONINI <0.30 <0.30   CBG:  Recent Labs Lab 10/31/13 0826 10/31/13 1121 10/31/13 1813 10/31/13 2135 11/01/13 0738  GLUCAP 92 144* 124* 185* 158*    Iron Studies: No results found for this basename: IRON, TIBC, TRANSFERRIN, FERRITIN,  in the last 72 hours Studies/Results: No results found. Medications: Infusions:    Scheduled Medications: . amiodarone  200 mg Oral Daily  . aspirin  81 mg Oral Daily  . atorvastatin  40 mg Oral q1800  . clopidogrel  75 mg  Oral Q breakfast  . darbepoetin (ARANESP) injection - DIALYSIS  12.5 mcg Intravenous Q Wed-HD  . doxercalciferol  2 mcg Intravenous Q M,W,F-HD  . heparin  5,000 Units Subcutaneous 3 times per day  . insulin aspart  0-9 Units Subcutaneous TID WC  . sodium chloride  3 mL Intravenous Q12H  . vancomycin  125 mg Oral QID    have reviewed scheduled and prn medications.  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

## 2013-11-01 NOTE — Progress Notes (Signed)
Subjective:  Patient continues to have numerous loose stools. She denies chest pain. She denies dizzynes sor lightheadedness. She admits to mild LLQ abdominal pain.  She had some borderline BP immediately after HD.   Objective: Vital signs in last 24 hours: Filed Vitals:   10/31/13 2356 11/01/13 0325 11/01/13 0700 11/01/13 0737  BP: 104/35 113/41  122/44  Pulse: 60 56  59  Temp: 99.5 F (37.5 C) 99.1 F (37.3 C) 98.4 F (36.9 C)   TempSrc: Oral Oral Oral   Resp: 21 17  20   Height:      Weight:  110 lb 10.7 oz (50.2 kg)    SpO2: 99% 100%  99%   Weight change: 3 lb 12 oz (1.7 kg)  Intake/Output Summary (Last 24 hours) at 11/01/13 1018 Last data filed at 11/01/13 0735  Gross per 24 hour  Intake    120 ml  Output   2500 ml  Net  -2380 ml   Physical Exam  Constitutional: She is oriented to person, place, and time. She appears well-developed and well-nourished. No distress.  HENT:  Head: Normocephalic.  Mouth/Throat: Oropharynx is clear and moist. No oropharyngeal exudate.  Cardiovascular: Normal rate, regular rhythm and intact distal pulses.  Exam reveals no gallop and no friction rub.   Murmur heard. 4/6 systolic crescendo decrescendo murmur in LUSB that radiates to carotids.  Pulmonary/Chest: Effort normal and breath sounds normal. No respiratory distress. She has no wheezes. She has no rales.  Abdominal: Soft. Bowel sounds are normal. She exhibits no distension. There is tenderness.  Mildly tender in LLQ  Musculoskeletal: She exhibits no edema and no tenderness.  Neurological: She is alert and oriented to person, place, and time.  Skin: She is not diaphoretic.  Psychiatric: She has a normal mood and affect. Her behavior is normal.    Lab Results: Basic Metabolic Panel:  Recent Labs Lab 10/26/13 1428  10/29/13 1635 10/30/13 0645 10/31/13 0845 11/01/13 0230  NA 132*  < >  --  142 138 138  K 3.6*  < >  --  3.6* 3.7 3.8  CL 90*  < >  --  99 96 97  CO2 26  --    --  26 20 28   GLUCOSE 163*  < >  --  90 85 190*  BUN 13  < >  --  33* 47* 17  CREATININE 2.80*  < >  --  4.64* 5.43* 2.83*  CALCIUM 9.0  --   --  9.3 9.1 8.7  MG  --   --  2.0  --   --   --   PHOS 4.1  --   --  5.5*  --   --   < > = values in this interval not displayed. Liver Function Tests:  Recent Labs Lab 10/29/13 0944 10/30/13 0645  AST 27  --   ALT 20  --   ALKPHOS 157*  --   BILITOT 0.4  --   PROT 7.2  --   ALBUMIN 3.5 2.8*   CBC:  Recent Labs Lab 10/31/13 0845 11/01/13 0230  WBC 11.8* 8.0  NEUTROABS 9.2* 5.6  HGB 9.1* 8.5*  HCT 26.8* 25.7*  MCV 90.5 92.8  PLT 307 232   Cardiac Enzymes:  Recent Labs Lab 10/29/13 1635 10/29/13 2145  TROPONINI <0.30 <0.30   CBG:  Recent Labs Lab 10/31/13 0055 10/31/13 0826 10/31/13 1121 10/31/13 1813 10/31/13 2135 11/01/13 0738  GLUCAP 103* 92 144* 124* 185*  158*   Thyroid Function Tests:  Recent Labs Lab 10/29/13 1635  TSH 1.710    Micro Results: Recent Results (from the past 240 hour(s))  MRSA PCR SCREENING     Status: None   Collection Time    10/29/13  1:59 PM      Result Value Ref Range Status   MRSA by PCR NEGATIVE  NEGATIVE Final   Comment:            The GeneXpert MRSA Assay (FDA     approved for NASAL specimens     only), is one component of a     comprehensive MRSA colonization     surveillance program. It is not     intended to diagnose MRSA     infection nor to guide or     monitor treatment for     MRSA infections.  CLOSTRIDIUM DIFFICILE BY PCR     Status: Abnormal   Collection Time    10/30/13  2:55 PM      Result Value Ref Range Status   C difficile by pcr POSITIVE (*) NEGATIVE Final   Comment: CRITICAL RESULT CALLED TO, READ BACK BY AND VERIFIED WITH:     STANFIELD RN 17:20 10/30/13 ([email or web address])   Studies/Results: No results found. Medications: I have reviewed the patient's current medications. Scheduled Meds: . amiodarone  200 mg Oral Daily  . aspirin  81 mg Oral Daily  .  atorvastatin  40 mg Oral q1800  . clopidogrel  75 mg Oral Q breakfast  . darbepoetin (ARANESP) injection - DIALYSIS  12.5 mcg Intravenous Q Wed-HD  . doxercalciferol  2 mcg Intravenous Q M,W,F-HD  . heparin  5,000 Units Subcutaneous 3 times per day  . insulin aspart  0-9 Units Subcutaneous TID WC  . sodium chloride  3 mL Intravenous Q12H  . vancomycin  125 mg Oral QID   Continuous Infusions:  PRN Meds:.acetaminophen, acetaminophen Assessment/Plan: Principal Problem:   Clostridium difficile colitis Active Problems:   End stage renal disease   DM (diabetes mellitus)   HTN (hypertension)   Aortic stenosis, moderate at cath 07/11/13   PAF (paroxysmal atrial fibrillation)   Atrial fibrillation   Cardiac arrest   Sinus bradycardia   Aortic stenosis  C Diff Colitis: Patient likely has C Diff with resolving leukocytosis - Patient requires normotension given severe AS and recent arrest due to hypotension.  - Use 250 cc bolus as needed to maintain MAP > 60.  - PO Vancomycin per pharmacy - xfer to tele  Cardiac Arrest - reported by HD center. Patient appears to have had PEA 2/2 to hypotension during HD in setting of severe AS complicated by hypovolemia due to c diff colitis.  - transfer to telemetry monitoring  -cardiology consulted, appreciate rec's -amiodarone 200mg  BID  - appreciate cardiologies rec's regarding AS  Severe Aortic Stenosis - Echo 10/06/13 showing EF 60-65%, but aortic valve area 0.45.  -monitor volume status  - Maintain BP as AS requires afterload for cardiac perfusion - appreciate cardiologies rec's regarding AS  ESRD - - Managed by renal with three days a week HD  QT prolongation - seen on prior EKG's, now at  504.  - Avoid QT prolongation drugs.   CAD - s/p CABG x3 in 2011 (LIMA to LAD, vein to circumflex, vein to PDA), s/p cath Nov 2014 (DES to RCA).  -continue aspirin, plavix, statin   DM2 - on home Novolog 70/30 on a sliding scale, pt unclear on dosing.   -  Continue inpatient SSI protocol  Paroxysmal Afib - recently discharged 2/28 after an admission for afib with RVR, discharged on amiodarone taper and metoprolol. Currently in sinus bradycardia. See plan as above. -continue amiodarone  -hold metoprolol   Dispo: Disposition is deferred at this time, awaiting improvement of current medical problems.  Anticipated discharge in approximately 2 day(s).   The patient does have a current PCP (Imran Clarnce Flock, MD) and does not need an [facility] hospital follow-up appointment after discharge.  The patient does have transportation limitations that hinder transportation to clinic appointments.  .Services Needed at time of discharge: Y = Yes, Blank = No PT:   OT:   RN:   Equipment:   Other:     LOS: 3 days   Pleas Koch, MD 11/01/2013, 10:18 AM

## 2013-11-02 ENCOUNTER — Other Ambulatory Visit: Payer: Self-pay | Admitting: *Deleted

## 2013-11-02 ENCOUNTER — Inpatient Hospital Stay (HOSPITAL_COMMUNITY): Payer: Medicare Other

## 2013-11-02 DIAGNOSIS — I359 Nonrheumatic aortic valve disorder, unspecified: Secondary | ICD-10-CM

## 2013-11-02 DIAGNOSIS — A0472 Enterocolitis due to Clostridium difficile, not specified as recurrent: Principal | ICD-10-CM

## 2013-11-02 LAB — RENAL FUNCTION PANEL
Albumin: 2.6 g/dL — ABNORMAL LOW (ref 3.5–5.2)
BUN: 26 mg/dL — ABNORMAL HIGH (ref 6–23)
CO2: 24 mEq/L (ref 19–32)
Calcium: 9 mg/dL (ref 8.4–10.5)
Chloride: 94 mEq/L — ABNORMAL LOW (ref 96–112)
Creatinine, Ser: 4.38 mg/dL — ABNORMAL HIGH (ref 0.50–1.10)
GFR calc Af Amer: 11 mL/min — ABNORMAL LOW (ref >90)
GFR calc non Af Amer: 9 mL/min — ABNORMAL LOW (ref >90)
Glucose, Bld: 159 mg/dL — ABNORMAL HIGH (ref 70–99)
Phosphorus: 3.7 mg/dL (ref 2.3–4.6)
Potassium: 4 mEq/L (ref 3.7–5.3)
Sodium: 132 mEq/L — ABNORMAL LOW (ref 137–147)

## 2013-11-02 LAB — PULMONARY FUNCTION TEST
FEF 25-75 Pre: 0.51 L/sec
FEF2575-%Pred-Pre: 30 %
FEV1-%PRED-PRE: 35 %
FEV1-PRE: 0.64 L
FEV1FVC-%Pred-Pre: 103 %
FEV6-%PRED-PRE: 35 %
FEV6-PRE: 0.8 L
FEV6FVC-%Pred-Pre: 104 %
FVC-%Pred-Pre: 34 %
FVC-Pre: 0.8 L
PRE FEV1/FVC RATIO: 79 %
Pre FEV6/FVC Ratio: 100 %

## 2013-11-02 LAB — CBC
HCT: 28.7 % — ABNORMAL LOW (ref 36.0–46.0)
Hemoglobin: 9.3 g/dL — ABNORMAL LOW (ref 12.0–15.0)
MCH: 30.1 pg (ref 26.0–34.0)
MCHC: 32.4 g/dL (ref 30.0–36.0)
MCV: 92.9 fL (ref 78.0–100.0)
Platelets: 281 10*3/uL (ref 150–400)
RBC: 3.09 MIL/uL — ABNORMAL LOW (ref 3.87–5.11)
RDW: 15.6 % — ABNORMAL HIGH (ref 11.5–15.5)
WBC: 5.1 10*3/uL (ref 4.0–10.5)

## 2013-11-02 LAB — GLUCOSE, CAPILLARY
GLUCOSE-CAPILLARY: 172 mg/dL — AB (ref 70–99)
GLUCOSE-CAPILLARY: 281 mg/dL — AB (ref 70–99)
Glucose-Capillary: 104 mg/dL — ABNORMAL HIGH (ref 70–99)

## 2013-11-02 MED ORDER — LIDOCAINE-PRILOCAINE 2.5-2.5 % EX CREA: 1.0000 "application " | TOPICAL_CREAM | CUTANEOUS | Status: DC | PRN

## 2013-11-02 MED ORDER — SODIUM CHLORIDE 0.9 % IV SOLN: 100.0000 mL | INTRAVENOUS | Status: DC | PRN

## 2013-11-02 MED ORDER — LIDOCAINE HCL (PF) 1 % IJ SOLN: 5.0000 mL | INTRAMUSCULAR | Status: DC | PRN

## 2013-11-02 MED ORDER — PENTAFLUOROPROP-TETRAFLUOROETH EX AERO: 1.0000 "application " | INHALATION_SPRAY | CUTANEOUS | Status: DC | PRN

## 2013-11-02 MED ORDER — DARBEPOETIN ALFA-POLYSORBATE 60 MCG/0.3ML IJ SOLN
60.0000 ug | INTRAMUSCULAR | Status: DC
  Administered 2013-11-07: 60 ug via INTRAVENOUS
  Filled 2013-11-02: qty 0.3

## 2013-11-02 MED ORDER — HEPARIN SODIUM (PORCINE) 1000 UNIT/ML DIALYSIS: 20.0000 [IU]/kg | INTRAMUSCULAR | Status: DC | PRN

## 2013-11-02 MED ORDER — ALTEPLASE 2 MG IJ SOLR
2.0000 mg | Freq: Once | INTRAMUSCULAR | Status: DC | PRN
Start: 2013-11-02 — End: 2013-11-02

## 2013-11-02 MED ORDER — ALTEPLASE 2 MG IJ SOLR
2.0000 mg | Freq: Once | INTRAMUSCULAR | Status: DC | PRN
  Filled 2013-11-02: qty 2

## 2013-11-02 MED ORDER — NEPRO/CARBSTEADY PO LIQD: 237.0000 mL | ORAL | Status: DC | PRN

## 2013-11-02 MED ORDER — LIDOCAINE-PRILOCAINE 2.5-2.5 % EX CREA
1.0000 "application " | TOPICAL_CREAM | CUTANEOUS | Status: DC | PRN
  Filled 2013-11-02: qty 5

## 2013-11-02 MED ORDER — ALTEPLASE 2 MG IJ SOLR: 2.0000 mg | Freq: Once | INTRAMUSCULAR | Status: DC | PRN

## 2013-11-02 MED ORDER — DOXERCALCIFEROL 4 MCG/2ML IV SOLN
INTRAVENOUS | Status: AC
  Filled 2013-11-02: qty 2

## 2013-11-02 MED ORDER — HEPARIN SODIUM (PORCINE) 1000 UNIT/ML DIALYSIS: 1000.0000 [IU] | INTRAMUSCULAR | Status: DC | PRN

## 2013-11-02 MED ORDER — NEPRO/CARBSTEADY PO LIQD
237.0000 mL | ORAL | Status: DC | PRN
  Filled 2013-11-02: qty 237

## 2013-11-02 MED ORDER — HEPARIN SODIUM (PORCINE) 1000 UNIT/ML DIALYSIS
1000.0000 [IU] | INTRAMUSCULAR | Status: DC | PRN
Start: 2013-11-02 — End: 2013-11-02

## 2013-11-02 NOTE — Progress Notes (Signed)
PT Cancellation Note  Patient Details Name: [name] MRN: [id number] DOB: 01-18-1941   Cancelled Treatment:    Reason Eval/Treat Not Completed: Patient at procedure or test/unavailable; patient out of the room presumably in hemodialysis.  Will attempt later as time permits.   Darlynn Ricco,CYNDI 11/02/2013, 9:25 AM

## 2013-11-02 NOTE — Progress Notes (Signed)
Inpatient Diabetes Program Recommendations  AACE/ADA: New Consensus Statement on Inpatient Glycemic Control (2013)  Target Ranges:  Prepandial:   less than 140 mg/dL      Peak postprandial:   less than 180 mg/dL (1-2 hours)      Critically ill patients:  140 - 180 mg/dL     Results for Cassie PaisBRADY, [name] (MRN [id number]) as of 11/02/2013 09:34  Ref. Range 11/01/2013 07:38 11/01/2013 12:00 11/01/2013 16:51 11/01/2013 21:38  Glucose-Capillary Latest Range: 70-99 mg/dL 865158 (H) 784213 (H) 696215 (H) 174 (H)     **Per patient, patient hasn't taken her 70/30 insulin in over 2 weeks.  Was supposed to be taking 70/30 insulin- 10 units with breakfast and 15 units with supper   **Having some glucose elevations.  Eating 85-100%.   **MD- Please consider restarting 1/2 of patient's home 70/30 insulin- 5 units with breakfast and 8 units with supper   Will follow. Ambrose FinlandJeannine Johnston Christe Tellez RN, MSN, CDE Diabetes Coordinator Inpatient Diabetes Program Team Pager: [phone] ([phone])

## 2013-11-02 NOTE — Progress Notes (Signed)
   Subjective:  No recurrent chest pain; had dialysis today  Objective:   Vital Signs in the last 24 hours: Temp:  [97.9 F (36.6 C)-98.7 F (37.1 C)] 98.3 F (36.8 C) (03/06 1408) Pulse Rate:  [59-75] 62 (03/06 1408) Resp:  [16-20] 16 (03/06 1309) BP: (100-164)/(38-71) 114/38 mmHg (03/06 1408) SpO2:  [92 %-100 %] 100 % (03/06 1408) Weight:  [113 lb 5.1 oz (51.4 kg)-117 lb 11.6 oz (53.4 kg)] 113 lb 5.1 oz (51.4 kg) (03/06 1309)  Intake/Output from previous day: 03/05 0701 - 03/06 0700 In: 480 [P.O.:480] Out: -   Medications: . amiodarone  200 mg Oral Daily  . aspirin  81 mg Oral Daily  . atorvastatin  40 mg Oral q1800  . clopidogrel  75 mg Oral Q breakfast  . [START ON 11/07/2013] darbepoetin (ARANESP) injection - DIALYSIS  60 mcg Intravenous Q Wed-HD  . doxercalciferol      . doxercalciferol  2 mcg Intravenous Q M,W,F-HD  . heparin  5,000 Units Subcutaneous 3 times per day  . insulin aspart  0-9 Units Subcutaneous TID WC  . sodium chloride  3 mL Intravenous Q12H  . vancomycin  125 mg Oral QID       Physical Exam:   General appearance: alert, cooperative and no distress Neck: no JVD, supple, symmetrical, trachea midline and thyroid not enlarged, symmetric, no tenderness/mass/nodules Lungs: clear to auscultation bilaterally Heart: regular rate and rhythm and 2/6 AS murmur Abdomen: soft, non-tender; bowel sounds normal; no masses,  no organomegaly Extremities: no edema, redness or tenderness in the calves or thighs   Rate: 60  Rhythm: normal sinus rhythm and pvc's  Lab Results:    Recent Labs  11/01/13 0230 11/02/13 0921  NA 138 132*  K 3.8 4.0  CL 97 94*  CO2 28 24  GLUCOSE 190* 159*  BUN 17 26*  CREATININE 2.83* 4.38*   No results found for this basename: TROPONINI, CK, MB,  in the last 72 hours Hepatic Function Panel  Recent Labs  11/02/13 0921  ALBUMIN 2.6*   No results found for this basename: INR,  in the last 72 hours BNP (last 3  results)  Recent Labs  10/22/13 1439  PROBNP >70000.0*    Lipid Panel     Component Value Date/Time   CHOL 122 10/06/2013 0615   TRIG 36 10/06/2013 0615   HDL 89 10/06/2013 0615   CHOLHDL 1.4 10/06/2013 0615   VLDL 7 10/06/2013 0615   LDLCALC 26 10/06/2013 0615      Imaging:  No results found.    Assessment/Plan:   Principal Problem:   Clostridium difficile colitis Active Problems:   End stage renal disease   DM (diabetes mellitus)   HTN (hypertension)   Aortic stenosis, moderate at cath 07/11/13   PAF (paroxysmal atrial fibrillation)   Atrial fibrillation   Cardiac arrest   Sinus bradycardia   Aortic stenosis  Rhythm remaining stable. No cp following HSRA on RCA; severe AS. No further AF. Diarrhea significantly improving. Pt had been turned down for CABG and AVR ? TAVR candidate.    Lennette Biharihomas A. Kelly, MD, [facility] 11/02/2013, 3:10 PM

## 2013-11-02 NOTE — Progress Notes (Signed)
Subjective:  No complaints, mild episode of diarrhea this AM, but improving- seen in dialysis  Objective: Vital signs in last 24 hours: Temp:  [97.9 F (36.6 C)-98.7 F (37.1 C)] 98.7 F (37.1 C) (03/06 0556) Pulse Rate:  [61-63] 63 (03/06 0556) Resp:  [16-28] 16 (03/06 0556) BP: (105-130)/(40-47) 130/44 mmHg (03/06 0556) SpO2:  [92 %-100 %] 100 % (03/06 0556) Weight:  [53.4 kg (117 lb 11.6 oz)] 53.4 kg (117 lb 11.6 oz) (03/06 0500) Weight change: -2.4 kg (-5 lb 4.7 oz)  Intake/Output from previous day: 03/05 0701 - 03/06 0700 In: 480 [P.O.:480] Out: -    Lab Results:  Recent Labs  10/31/13 0845 11/01/13 0230  WBC 11.8* 8.0  HGB 9.1* 8.5*  HCT 26.8* 25.7*  PLT 307 232   BMET:  Recent Labs  10/31/13 0845 11/01/13 0230  NA 138 138  K 3.7 3.8  CL 96 97  CO2 20 28  GLUCOSE 85 190*  BUN 47* 17  CREATININE 5.43* 2.83*  CALCIUM 9.1 8.7   No results found for this basename: PTH,  in the last 72 hours Iron Studies: No results found for this basename: IRON, TIBC, TRANSFERRIN, FERRITIN,  in the last 72 hours  EXAM: General appearance:  Alert, in no apparent distress Resp:  CTA without rales, rhonchi, or wheezes Cardio:  RRR with Gr III/VI systolic murmur, no rub GI: + BS, soft and nontender Extremities:  No edema Access:  AVF @ [facility] weith + bruit  Dialysis Orders: Dialysis: MWF   3.5h F160 52kg 3K/2.25 Bath L AVF Prof 4 Heparin none  Hectorol 2 Epo 2200 Hgb 10.5 2/18 sats 24% sat and ferritin 1600 in January; iPTH 433 1/28   Assessment/Plan: 1. Cardiac arrest - during HD 3/2, s/p CPR for 10 mins; Hx CAD s/p CABG 201, A-fib, and severe AS: seen by Cardiology, high risk for AVR, but surgery consult as outpatient pending. 2. C diff colitis - resolving on PO Vancomycin. 3. ESRD - HD on MWF via AVF @ [facility], K 3.8.  HD pending today. 4. HTN/Volume - BP 130/44 stable, wt 53.4 kg, gentle fluid removal with HD pending. 5. Anemia - Hgb down to 8.5, Aranesp 12.5 mcg on  Wed.- possible GIB, per primary.  Also I think needs increased dose of aranesp 6. Sec HPT - Ca 8.7 (9.7 corrected), P 5.5; Hectorol 2 mcg, no binder.  7. Nutrition - Alb 2.8, renal diet & vitamin. 8. A-fib - on Amiodarone, Plavix. 9. DM - insulin per primary.  10. COPD    LOS: 4 days   LYLES,CHARLES 11/02/2013,8:12 AM  Patient seen and examined, agree with above note with above modifications.  Diarrhea is better , has been stable overnight.  Need to increase aranesp otherwise no changes  Annie SableKellie Hugo Lybrand, MD 11/02/2013

## 2013-11-02 NOTE — Progress Notes (Signed)
Subjective:  [name]. Patient reports fewer BMs and improved abdominal pain.  Objective: Vital signs in last 24 hours: Filed Vitals:   11/01/13 1747 11/01/13 2132 11/02/13 0500 11/02/13 0556  BP: 126/47 119/40  130/44  Pulse: 62 61  63  Temp: 98 F (36.7 C) 97.9 F (36.6 C)  98.7 F (37.1 C)  TempSrc: Oral Oral  Oral  Resp: 20 20  16   Height:      Weight:   117 lb 11.6 oz (53.4 kg)   SpO2: 96% 99%  100%   Weight change: -5 lb 4.7 oz (-2.4 kg)  Intake/Output Summary (Last 24 hours) at 11/02/13 0741 Last data filed at 11/01/13 1700  Gross per 24 hour  Intake    360 ml  Output      0 ml  Net    360 ml   Physical Exam  Constitutional: She is oriented to person, place, and time. She appears well-developed and well-nourished. No distress.  Cardiovascular: Intact distal pulses.   Murmur heard. Pulmonary/Chest: Effort normal and breath sounds normal. No respiratory distress. She has no wheezes. She has no rales.  Abdominal: Soft. Bowel sounds are normal. She exhibits no distension. There is no tenderness. There is no rebound and no guarding.  Neurological: She is alert and oriented to person, place, and time.  Skin: She is not diaphoretic.  Psychiatric: She has a normal mood and affect. Her behavior is normal.    Lab Results: Basic Metabolic Panel:  Recent Labs Lab 10/26/13 1428  10/29/13 1635 10/30/13 0645 10/31/13 0845 11/01/13 0230  NA 132*  < >  --  142 138 138  K 3.6*  < >  --  3.6* 3.7 3.8  CL 90*  < >  --  99 96 97  CO2 26  --   --  26 20 28   GLUCOSE 163*  < >  --  90 85 190*  BUN 13  < >  --  33* 47* 17  CREATININE 2.80*  < >  --  4.64* 5.43* 2.83*  CALCIUM 9.0  --   --  9.3 9.1 8.7  MG  --   --  2.0  --   --   --   PHOS 4.1  --   --  5.5*  --   --   < > = values in this interval not displayed. Liver Function Tests:  Recent Labs Lab 10/29/13 0944 10/30/13 0645  AST 27  --   ALT 20  --   ALKPHOS 157*  --   BILITOT 0.4  --   PROT 7.2  --     ALBUMIN 3.5 2.8*   CBC:  Recent Labs Lab 10/31/13 0845 11/01/13 0230  WBC 11.8* 8.0  NEUTROABS 9.2* 5.6  HGB 9.1* 8.5*  HCT 26.8* 25.7*  MCV 90.5 92.8  PLT 307 232   Cardiac Enzymes:  Recent Labs Lab 10/29/13 1635 10/29/13 2145  TROPONINI <0.30 <0.30   CBG:  Recent Labs Lab 10/31/13 1813 10/31/13 2135 11/01/13 0738 11/01/13 1200 11/01/13 1651 11/01/13 2138  GLUCAP 124* 185* 158* 213* 215* 174*   Thyroid Function Tests:  Recent Labs Lab 10/29/13 1635  TSH 1.710   Micro Results: Recent Results (from the past 240 hour(s))  MRSA PCR SCREENING     Status: None   Collection Time    10/29/13  1:59 PM      Result Value Ref Range Status   MRSA by PCR NEGATIVE  NEGATIVE Final   Comment:            The GeneXpert MRSA Assay (FDA     approved for NASAL specimens     only), is one component of a     comprehensive MRSA colonization     surveillance program. It is not     intended to diagnose MRSA     infection nor to guide or     monitor treatment for     MRSA infections.  CLOSTRIDIUM DIFFICILE BY PCR     Status: Abnormal   Collection Time    10/30/13  2:55 PM      Result Value Ref Range Status   C difficile by pcr POSITIVE (*) NEGATIVE Final   Comment: CRITICAL RESULT CALLED TO, READ BACK BY AND VERIFIED WITH:     STANFIELD RN 17:20 10/30/13 ([email or web address])   Studies/Results: No results found. Medications: I have reviewed the patient's current medications. Scheduled Meds: . amiodarone  200 mg Oral Daily  . aspirin  81 mg Oral Daily  . atorvastatin  40 mg Oral q1800  . clopidogrel  75 mg Oral Q breakfast  . darbepoetin (ARANESP) injection - DIALYSIS  12.5 mcg Intravenous Q Wed-HD  . doxercalciferol  2 mcg Intravenous Q M,W,F-HD  . heparin  5,000 Units Subcutaneous 3 times per day  . insulin aspart  0-9 Units Subcutaneous TID WC  . sodium chloride  3 mL Intravenous Q12H  . vancomycin  125 mg Oral QID   Continuous Infusions:  PRN Meds:.acetaminophen,  acetaminophen Assessment/Plan: Principal Problem:   Clostridium difficile colitis Active Problems:   End stage renal disease   DM (diabetes mellitus)   HTN (hypertension)   Aortic stenosis, moderate at cath 07/11/13   PAF (paroxysmal atrial fibrillation)   Atrial fibrillation   Cardiac arrest   Sinus bradycardia   Aortic stenosis  # C diff colitis - Pt significantly improved with decreased BMs and decreased LLQ pain. .  - continue po vancomycin 125mg  4 times daily  - continue to monitor for improvement   # Cardiac Event - cardiac arrest reported by HD center. Event appears more consistent with hypotension-induced syncope during dialysis in the setting of severe AS and likely volume depletion from C difficile diarrhea. EP evaluation concluded that primary bradycardia was unlikely, and that patient is not a candidate for EP procedure at this time. Patient tolerated HD well, with borderline hypotension post dialysis that has improved.  - tele -cardiology consulted for management of severe afterload dependent AS in setting of ESRD  -hold metoprolol  -continue amiodarone 200mg  daily  -continue to monitor patient's blood pressure, and maintain afterload with cautious IVF boluses as needed   # Severe Aortic Stenosis - Echo 10/06/13 showing EF 60-65%, but aortic valve area 0.45. 24 hr blood pressures ranging 107/38 - 121/41  -TAVR eval underway by cardiology team -monitor volume status  - maintain MAP >60 with 250 cc fluid boluses.  # ESRD - [facility] MWF HD. - HD per nephrology  # Bright red blood per rectum - Intermittent blood likely due to known internal and external hemorrhoids. Pt has had no frank blood in stool during this admission. - hemorrhoid cream once diarrhea resolves  # Paroxysmal Afib - recently discharged 2/28 after an admission for afib with RVR, discharged Baker amiodarone taper and metoprolol. Currently in sinus bradycardia; 24 hr HR ranging 49-53.  -hold metoprolol, continued  amiodarone at decreased dose as above  -no indication for pacemaker per  EP note   # QT prolongation - seen [facility] prior EKG's, though mildly higher Baker admission compared to prior (QTc = 504 at this time, similar to baseline).  - tele  # CAD - s/p CABG x3 in 2011 (LIMA to LAD, vein to circumflex, vein to PDA), s/p cath Nov 2014 (DES to RCA).  -continue aspirin, plavix, statin   # DM2 - Baker home Novolog 70/30 Baker a sliding scale, pt unclear Baker dosing. Per dietician, pt has not been taking insulin at home or checking blood glucose. Patient's A1c is 8.5. Patient has received 1 - 4 units of insulin total per day in hospital. -continue SSI while inpatient  - will need outpatient f.u regarding DM   Dispo: Disposition is deferred at this time, awaiting improvement of current medical problems.  Anticipated discharge in approximately 1-3 day(s).   The patient does have a current PCP (Imran Clarnce Flock, MD) and does not need an [facility] hospital follow-up appointment after discharge.  The patient does have transportation limitations that hinder transportation to clinic appointments.  .Services Needed at time of discharge: Y = Yes, Blank = No PT:   OT:   RN:   Equipment:   Other:     LOS: 4 days   Pleas Koch, MD 11/02/2013, 7:41 AM

## 2013-11-02 NOTE — Progress Notes (Signed)
Multi-Disciplinary Valve Team Follow Up  Follow up will be arranged with the TAVR team. She is high risk for traditional approach to aortic valve replacement and will not be a candidate for surgical approach. We will arrange f/u in in the CT surgery office with Dr. Cornelius Moraswen or Dr. Laneta SimmersBartle and then follow going forward with the TAVR team. No indication for balloon valvuloplasty during this admission. Given multiple comorbidities, her risk for TAVR will be high. She will need PFTs now or as an outpatient. Further planning with CTA and cardiac CT will be arranged by the TAVR team as an outpatient. I will ask our TAVR nurse coordinator to arrange a consult with Dr. Cornelius Moraswen or Dr. Laneta SimmersBartle in CT surgery office within next several weeks.   [name] 11/02/2013 8:04 AM

## 2013-11-02 NOTE — Procedures (Signed)
Patient was seen on dialysis and the procedure was supervised.  BFR 400  Via AVF BP is  105/49.   Patient appears to be tolerating treatment well  Shulamit Donofrio A 11/02/2013

## 2013-11-02 NOTE — Progress Notes (Signed)
Subjective: NAEON.  Lower abdominal pain is significantly improved, and nursing staff reports diarrhea has decreased from q1h to q3-4h.  Patient denies chest pain, lightheadedness, or dyspnea.  Objective: Vital signs in last 24 hours: Temp:  [97.9 F (36.6 C)-98.7 F (37.1 C)] 98.1 F (36.7 C) (03/06 1309) Pulse Rate:  [59-75] 66 (03/06 1309) Resp:  [16-20] 16 (03/06 1309) BP: (100-164)/(40-71) 134/50 mmHg (03/06 1309) SpO2:  [92 %-100 %] 95 % (03/06 1309) Weight:  [51.4 kg (113 lb 5.1 oz)-53.4 kg (117 lb 11.6 oz)] 51.4 kg (113 lb 5.1 oz) (03/06 1309) Weight change: -2.4 kg (-5 lb 4.7 oz) 03/05 0701 - 03/06 0700 In: 480 [P.O.:480] Out: -   Intake/Output Summary (Last 24 hours) at 11/02/13 1401 Last data filed at 11/02/13 1309  Gross per 24 hour  Intake    240 ml  Output   2000 ml  Net  -1760 ml   Physical Exam General: Resting comfortably in no acute distress, receiving HD. Eyes: EOMI.  Conjunctivae normal, sclerae clear and anicteric. HENT: Oropharyngeal mucous membranes pink and moist. Pulm: Normal respiratory rate and effort.  CTAB, no wheezes or crackles. Cardio: RRR, S1 and S2 with harsh holosystolic murmur Abdomen: Normoactive bowel sounds. Soft, non-tender, non-distended. Extremities: Warm and well perfused, no edema or cyanosis; multiple chronic, poorly healing lesions on LEs. Neuro: Alert, oriented, and appropriate.  CN II-XII grossly intact.  Lab Results: Basic Metabolic Panel:  Recent Labs Lab 10/29/13 1635 10/30/13 0645  11/01/13 0230 11/02/13 0921  NA  --  142  < > 138 132*  K  --  3.6*  < > 3.8 4.0  CL  --  99  < > 97 94*  CO2  --  26  < > 28 24  GLUCOSE  --  90  < > 190* 159*  BUN  --  33*  < > 17 26*  CREATININE  --  4.64*  < > 2.83* 4.38*  CALCIUM  --  9.3  < > 8.7 9.0  MG 2.0  --   --   --   --   PHOS  --  5.5*  --   --  3.7  < > = values in this interval not displayed.  Liver Function Tests:  Recent Labs Lab 10/29/13 0944  10/30/13 0645 11/02/13 0921  AST 27  --   --   ALT 20  --   --   ALKPHOS 157*  --   --   BILITOT 0.4  --   --   PROT 7.2  --   --   ALBUMIN 3.5 2.8* 2.6*   CBC:  Recent Labs Lab 10/31/13 0845 11/01/13 0230 11/02/13 1000  WBC 11.8* 8.0 5.1  NEUTROABS 9.2* 5.6  --   HGB 9.1* 8.5* 9.3*  HCT 26.8* 25.7* 28.7*  MCV 90.5 92.8 92.9  PLT 307 232 281   Cardiac Enzymes:  Recent Labs Lab 10/29/13 1635 10/29/13 2145  TROPONINI <0.30 <0.30   CBG:  Recent Labs Lab 10/31/13 1813 10/31/13 2135 11/01/13 0738 11/01/13 1200 11/01/13 1651 11/01/13 2138  GLUCAP 124* 185* 158* 213* 215* 174*   Thyroid Function Tests:  Recent Labs Lab 10/29/13 1635  TSH 1.710   Micro Results: Recent Results (from the past 240 hour(s))  MRSA PCR SCREENING     Status: None   Collection Time    10/29/13  1:59 PM      Result Value Ref Range Status   MRSA by PCR  NEGATIVE  NEGATIVE Final   Comment:            The GeneXpert MRSA Assay (FDA     approved for NASAL specimens     only), is one component of a     comprehensive MRSA colonization     surveillance program. It is not     intended to diagnose MRSA     infection nor to guide or     monitor treatment for     MRSA infections.  CLOSTRIDIUM DIFFICILE BY PCR     Status: Abnormal   Collection Time    10/30/13  2:55 PM      Result Value Ref Range Status   C difficile by pcr POSITIVE (*) NEGATIVE Final   Comment: CRITICAL RESULT CALLED TO, READ BACK BY AND VERIFIED WITH:     STANFIELD RN 17:20 10/30/13 ([email or web address])   Studies/Results:  EKG 3/5 unchanged from prior.  Medications: I have reviewed the patient's current medications. Scheduled Meds: . amiodarone  200 mg Oral Daily  . aspirin  81 mg Oral Daily  . atorvastatin  40 mg Oral q1800  . clopidogrel  75 mg Oral Q breakfast  . [START ON 11/07/2013] darbepoetin (ARANESP) injection - DIALYSIS  60 mcg Intravenous Q Wed-HD  . doxercalciferol      . doxercalciferol  2 mcg Intravenous Q  M,W,F-HD  . heparin  5,000 Units Subcutaneous 3 times per day  . insulin aspart  0-9 Units Subcutaneous TID WC  . sodium chloride  3 mL Intravenous Q12H  . vancomycin  125 mg Oral QID   Continuous Infusions:  PRN Meds:.acetaminophen, acetaminophen Assessment/Plan:   [name] is a 73 year old woman with a history of PAF, CAD, aortic stenosis, and ESRD on MWF hemodialysis, s/p recent discharge for Afib with RVR, who presents after receiving 10 minutes of CPR with ROSC after an apparent cardiac arrest during her dialysis session on 10/29/13. She does not remember the event; the AED reportedly showed that the patient was in a non-shockable, idioventricular pattern.   # C diff colitis - Pt pain and stool frequency improving after 48h on vancomycin. Patient complains of rectal pain that is chronic for her and presumed due to external hemorrhoids, likely exacerbated by frequent stools, also improving. - continue po vancomycin 125mg  4 times daily  - consider hemorrhoid ointment if ongoing rectal pain after diarrhea improves   # Cardiac Event - putative cardiac arrest reported by HD center, though no rhythm strips obtained at the time. Event appears more consistent with hypotension-induced syncope during dialysis in the setting of severe AS and likely volume depletion from C difficile diarrhea.  Cardiology determined patient not a candidate for open valve replacement.  Patient is high risk for TAVR; nurse coordinator to arrange outpatient follow up with CT surgery for evaluation for TAVR. -on tele -outpatient evaluation for TAVR to be arranged by nurse coordinator  -PFTs ordered for TAVR evaluation -hold metoprolol  -continue amiodarone 200mg  daily  -continue to monitor patient's blood pressure, and maintain preload with cautious IVF boluses as needed  # Severe Aortic Stenosis - Echo 10/06/13 showing EF 60-65%, but aortic valve area 0.45. 24 hr blood pressures ranging 107/38 - 121/41  -TAVR follow up as  above  # ESRD - on MWF HD at [facility] dialysis center. Only received about 1 hour of her scheduled treatment 3/2. BUN 17 -> 26, and Cr 2.8 -> 4.4 after HD 3/4; patient received inpatient dialysis today.  K has been stable at 3.6-3.8.  -daily weights  -patient tolerated inpatient hemodialysis well today, per nephrology.   # Bright red blood per rectum - Intermittent blood and rectal pain with defecation for the last 3 months per pt report. Colonoscopy with Dr. Misenheimer in 2009 showed external hemorroids, removeJennye Boroughsd hyperplastic polyps with recommended 10 year follow up. This most likely represents hemorrhoidal bleeding (multiple hemorrhoids present on DRE) in the setting of increased stool frequency, vs diverticulosis. Hb currently stable; increased 8.5 -> 9.3.  -no need for GI consult at this time  # Paroxysmal Afib - recently discharged 2/28 after an admission for afib with RVR, discharged on amiodarone taper and metoprolol. Currently in sinus bradycardia; 24 hr HR ranging 49-53.  -hold metoprolol, continued amiodarone at decreased dose as above  -will not receive pacemaker per EP note   # QT prolongation - seen on prior EKG's, though mildly higher on admission compared to prior (QTc = 549 from 504). Etiology includes amiodarone usage vs electrolyte imbalance vs thyroid abnormality (though no history). TSH 1.7, Mg 2.0.  -continue telemetry  -patient on anti-arrhythmic therapy, not candidate for EP procedure   # CAD - s/p CABG x3 in 2011 (LIMA to LAD, vein to circumflex, vein to PDA), s/p cath Nov 2014 (DES to RCA).  -continue aspirin, plavix, statin   # DM2 - On home Novolog 70/30 on a sliding scale, pt unclear on dosing. Per dietician, pt has not been taking insulin at home or checking blood glucose. Patient's A1c is 8.5. Patient has received 1 - 4 units of insulin total per day in hospital. -continue SSI while inpatient  -consider prescription for glucometer and strips on discharge   -patient will need close outpatient follow up to ensure good glycemic control  Dispo: Disposition is deferred at this time, awaiting further improvement of C diff diarrhea. Anticipated discharge in approximately 1-2 day(s).   This is a Psychologist, occupationalMedical Student Note.  The care of the patient was discussed with Dr. Glendell DockerKomanski and the assessment and plan formulated with their assistance.  Please see their attached note for official documentation of the daily encounter.   LOS: 4 days   [name], Med Student 11/02/2013, 2:01 PM

## 2013-11-02 NOTE — Discharge Instructions (Signed)
Clostridium Difficile Infection Clostridium difficile (C. difficile) is a bacteria found in the intestinal tract or colon. Under certain conditions, it causes diarrhea and sometimes severe disease. The severe form of the disease is known as pseudomembranous colitis (often called C. difficile colitis). This disease can damage the lining of the colon or cause the colon to become enlarged (toxic megacolon).  CAUSES  Your colon normally contains many different bacteria, including C. difficile. The balance of bacteria in your colon can change during illness. This is especially true when you take antibiotic medicine. Taking antibiotics may allow the C. difficile to grow, multiply excessively, and make a toxin that then causes illness. The elderly and people with certain medical conditions have a greater risk of getting C. difficile infections. SYMPTOMS   Watery diarrhea.  Fever.  Fatigue.  Loss of appetite.  Nausea.  Abdominal swelling, pain, or tenderness.  Dehydration. DIAGNOSIS  Your symptoms may make your caregiver suspicious of a C. difficile infection, especially if you have used antibiotics in the preceding weeks. However, there are only 2 ways to know for certain whether you have a C. difficile infection:  A lab test that finds the toxin in your stool.  The specific appearance of an abnormality (pseudomembrane) in your colon. This can only be seen by doing a sigmoidoscopy or colonoscopy. These procedures involve passing an instrument through your rectum to look at the inside of your colon. Your caregiver will help determine if these tests are necessary. TREATMENT   Most people are successfully treated with one of two specific antibiotics, usually given by mouth. Other antibiotics you are receiving are stopped if possible.  Intravenous (IV) fluids and correction of electrolyte imbalance may be necessary.  Rarely, surgery may be needed to remove the infected part of the  intestines.  Careful hand washing by you and your caregivers is important to prevent the spread of infection. In the hospital, your caregivers may also put on gowns and gloves to prevent the spread of the C. difficile bacteria. Your room is also cleaned regularly with a solution containing bleach or a product that is known to kill C. difficile. HOME CARE INSTRUCTIONS  Drink enough fluids to keep your urine clear or pale yellow. Avoid milk, caffeine, and alcohol.  Ask your caregiver for specific rehydration instructions.  Try eating small, frequent meals rather than large meals.  Take your antibiotics as directed. Finish them even if you start to feel better.  Do not use medicines to slow diarrhea. This could delay healing or cause complications.  Wash your hands thoroughly after using the bathroom and before preparing food.  Make sure people who live with you wash their hands often, too.  Carefully disinfect all surfaces with a product that contains chlorine bleach. SEEK MEDICAL CARE IF:  Diarrhea persists longer than expected or recurs after completing your course of antibiotic treatment for the C. difficile infection.  You have trouble staying hydrated. SEEK IMMEDIATE MEDICAL CARE IF:  You develop a new fever.  You have increasing abdominal pain or tenderness.  There is blood in your stools, or your stools are dark black and tarry.  You cannot hold down food or liquids. MAKE SURE YOU:   Understand these instructions.  Will watch your condition.  Will get help right away if you are not doing well or get worse. Document Released: 05/26/2005 Document Revised: 12/11/2012 Document Reviewed: 01/22/2011 Exit[employer] Patient Information 2014 [employer].  

## 2013-11-02 NOTE — Progress Notes (Signed)
Internal Medicine Attending  Date: 11/02/2013  Patient name: [name] Medical record number: [id number] Date of birth: 1941-02-15 Age: 73 y.o. Gender: female  I saw and evaluated the patient, and discussed her care on A.M rounds with housestaff.  I reviewed the resident's note by Dr. Glendell DockerKomanski and I agree with the resident's findings and plans as documented in his note.

## 2013-11-02 NOTE — Progress Notes (Signed)
PT Cancellation Note  Patient Details Name: [name] MRN: [id number] DOB: September 22, 1940   Cancelled Treatment:    Reason Eval/Treat Not Completed: Fatigue/lethargy limiting ability to participate; sitting edge of bed and reports too fatigued after dialysis to participate in PT.  Did assist patient into bed per her request.   [facility] Center[facility]YNN,CYNDI 11/02/2013, 3:44 PM

## 2013-11-03 ENCOUNTER — Encounter (HOSPITAL_COMMUNITY): Payer: Self-pay | Admitting: Thoracic Surgery (Cardiothoracic Vascular Surgery)

## 2013-11-03 DIAGNOSIS — I2510 Atherosclerotic heart disease of native coronary artery without angina pectoris: Secondary | ICD-10-CM

## 2013-11-03 DIAGNOSIS — I359 Nonrheumatic aortic valve disorder, unspecified: Secondary | ICD-10-CM

## 2013-11-03 LAB — GLUCOSE, CAPILLARY
GLUCOSE-CAPILLARY: 250 mg/dL — AB (ref 70–99)
Glucose-Capillary: 259 mg/dL — ABNORMAL HIGH (ref 70–99)
Glucose-Capillary: 192 mg/dL — ABNORMAL HIGH (ref 70–99)
Glucose-Capillary: 162 mg/dL — ABNORMAL HIGH (ref 70–99)

## 2013-11-03 MED ORDER — LORATADINE 10 MG PO TABS
10.0000 mg | ORAL_TABLET | Freq: Every day | ORAL | Status: DC | PRN
  Administered 2013-11-03 – 2013-11-04 (×2): 10 mg via ORAL
  Filled 2013-11-03 (×2): qty 1

## 2013-11-03 NOTE — Progress Notes (Signed)
PROGRESS NOTE  Subjective:   [name] is a 73 yo with hx of ESRD, CAD, AS, a-fib.   She had CABG in 2011.  She has had progressive worsening of her aortic stenosis.    She was admitted Feb 23  with AF and chest pain.  She converted with IV amio.  She had a large right pleural effusion - was tapped.   She was admitted again 10/29/13 with PEAarrest during dialysis.   AICD was attached and did not advise any shock.   We do not have any rhythm strip during that time to know what her arrest was due to.  May have been hypovolumic in the setting of AS and dialysis, diarrhea.     Objective:    Vital Signs:   Temp:  [97.4 F (36.3 C)-98.7 F (37.1 C)] 98 F (36.7 C) (03/07 0900) Pulse Rate:  [62-76] 72 (03/07 0900) Resp:  [16-18] 18 (03/07 0900) BP: (101-134)/(38-56) 128/49 mmHg (03/07 0900) SpO2:  [95 %-100 %] 98 % (03/07 0900) Weight:  [113 lb 5.1 oz (51.4 kg)] 113 lb 5.1 oz (51.4 kg) (03/06 1309)  Last BM Date: 11/03/13   24-hour weight change: Weight change: 0 lb (0 kg)  Weight trends: Filed Weights   11/02/13 0500 11/02/13 0851 11/02/13 1309  Weight: 117 lb 11.6 oz (53.4 kg) 117 lb 11.6 oz (53.4 kg) 113 lb 5.1 oz (51.4 kg)    Intake/Output:  03/06 0701 - 03/07 0700 In: -  Out: 2000  Total I/O In: 240 [P.O.:240] Out: -    Physical Exam: BP 128/49  Pulse 72  Temp(Src) 98 F (36.7 C) (Oral)  Resp 18  Ht 5\' 4"  (1.626 m)  Wt 113 lb 5.1 oz (51.4 kg)  BMI 19.44 kg/m2  SpO2 98%  Wt Readings from Last 3 Encounters:  11/02/13 113 lb 5.1 oz (51.4 kg)  11/02/13 113 lb 5.1 oz (51.4 kg)  10/27/13 115 lb 3.2 oz (52.254 kg)    General: Vital signs reviewed and noted.   Head: Normocephalic, atraumatic.  Eyes: conjunctivae/corneas clear.  EOM's intact.   Throat: normal  Neck:  normal  Lungs:    bilateral rales, rhonchi  Heart:  RR, 3/6 systolic murmur  Abdomen:  Soft, non-tender, non-distended    Extremities: No  edema  Neurologic: A&O X3, CN II - XII are  grossly intact.   Psych: Normal    Tele:  NSR , BBB Labs: BMET:  Recent Labs  11/01/13 0230 11/02/13 0921  NA 138 132*  K 3.8 4.0  CL 97 94*  CO2 28 24  GLUCOSE 190* 159*  BUN 17 26*  CREATININE 2.83* 4.38*  CALCIUM 8.7 9.0  PHOS  --  3.7    Liver function tests:  Recent Labs  11/02/13 0921  ALBUMIN 2.6*   No results found for this basename: LIPASE, AMYLASE,  in the last 72 hours  CBC:  Recent Labs  11/01/13 0230 11/02/13 1000  WBC 8.0 5.1  NEUTROABS 5.6  --   HGB 8.5* 9.3*  HCT 25.7* 28.7*  MCV 92.8 92.9  PLT 232 281    Cardiac Enzymes: No results found for this basename: CKTOTAL, CKMB, TROPONINI,  in the last 72 hours  Coagulation Studies: No results found for this basename: LABPROT, INR,  in the last 72 hours  Other: No components found with this basename: POCBNP,  No results found for this basename: DDIMER,  in the last 72 hours No results found  for this basename: HGBA1C,  in the last 72 hours No results found for this basename: CHOL, HDL, LDLCALC, TRIG, CHOLHDL,  in the last 72 hours No results found for this basename: TSH, T4TOTAL, FREET3, T3FREE, THYROIDAB,  in the last 72 hours No results found for this basename: VITAMINB12, FOLATE, FERRITIN, TIBC, IRON, RETICCTPCT,  in the last 72 hours   Other results:  EKG:  Sinus Gregori at 57, RBBB  Medications:    Infusions:    Scheduled Medications: . amiodarone  200 mg Oral Daily  . aspirin  81 mg Oral Daily  . atorvastatin  40 mg Oral q1800  . clopidogrel  75 mg Oral Q breakfast  . [START ON 11/07/2013] darbepoetin (ARANESP) injection - DIALYSIS  60 mcg Intravenous Q Wed-HD  . doxercalciferol  2 mcg Intravenous Q M,W,F-HD  . heparin  5,000 Units Subcutaneous 3 times per day  . insulin aspart  0-9 Units Subcutaneous TID WC  . sodium chloride  3 mL Intravenous Q12H  . vancomycin  125 mg Oral QID    Assessment/ Plan:   Principal Problem:   Clostridium difficile colitis Active Problems:    End stage renal disease   DM (diabetes mellitus)   HTN (hypertension)   Aortic stenosis, moderate at cath 07/11/13   PAF (paroxysmal atrial fibrillation)   Atrial fibrillation   Cardiac arrest   Sinus bradycardia   Aortic stenosis  1. PEA arrest:  I think this was multifactorial - volume changes with dialysis, aortic stenosis, recent diarrhea.  She did not have a shockable rhythm and regain her rhythm with CPR and IVF.   BP is currently stable  2. Aortic stenosis:  She has been seen by the TAVR team.  Will need to wait until c.diff is cleared to do further eval.   3. Atrial fib:  Currently in NSR, on amio.  Not a coumadin candidate.  4. CAD  S/p CABG  - cont. Current meds   Disposition:   She appears to be stable from a cardiac standpoint.  Continue current meds.  Follow up with the TAVR team as OP.    Will sign off.  Call for questions.  Length of Stay: 5  Vesta Mixer, Montez Hageman., MD, [facility] 11/03/2013, 12:25 PM Office [phone] Pager [phone]

## 2013-11-03 NOTE — Progress Notes (Signed)
I agree with the medical students note, please see my note for additional comments.  Angelina Sheriffhris Lacey Dotson, MD

## 2013-11-03 NOTE — Progress Notes (Signed)
Subjective: NAEON.  Lower abdominal pain resolved.  Nursing staff reports diarrhea has improved, still with 2-3 loose stools per shift.  Patient denies chest pain, lightheadedness, or dyspnea.  Too fatigued after dialysis to complete PT yesterday.  Objective: Vital signs in last 24 hours: Temp:  [97.4 F (36.3 C)-98.7 F (37.1 C)] 97.5 F (36.4 C) (03/07 0510) Pulse Rate:  [62-76] 62 (03/07 0510) Resp:  [16-18] 16 (03/07 0510) BP: (101-164)/(38-67) 117/50 mmHg (03/07 0510) SpO2:  [95 %-100 %] 99 % (03/07 0510) Weight:  [51.4 kg (113 lb 5.1 oz)] 51.4 kg (113 lb 5.1 oz) (03/06 1309) Weight change: 0 kg (0 lb) 03/06 0701 - 03/07 0700 In: -  Out: 2000   Intake/Output Summary (Last 24 hours) at 11/03/13 0954 Last data filed at 11/02/13 1309  Gross per 24 hour  Intake      0 ml  Output   2000 ml  Net  -2000 ml   Physical Exam General: Resting comfortably in no acute distress. Eyes: EOMI.  Conjunctivae normal, sclerae clear and anicteric. HENT: Oropharyngeal mucous membranes pink and moist. Pulm: Normal respiratory rate and effort.  Breath sounds faint, but CTAB with no wheezes or crackles. Cardio: RRR, S1 and S2 with harsh holosystolic murmur Abdomen: Normoactive bowel sounds. Soft, non-tender, non-distended. Extremities: Warm and well perfused, no edema or cyanosis; multiple chronic, poorly healing lesions on LEs. Neuro: Alert, oriented, and appropriate.  CN II-XII grossly intact.  Lab Results:  CBG:  Recent Labs Lab 11/01/13 1651 11/01/13 2138 11/02/13 1406 11/02/13 1643 11/02/13 2135 11/03/13 0758  GLUCAP 215* 174* 104* 172* 281* 259*   Studies/Results:  No new studies.  Medications: I have reviewed the patient's current medications. Scheduled Meds: . amiodarone  200 mg Oral Daily  . aspirin  81 mg Oral Daily  . atorvastatin  40 mg Oral q1800  . clopidogrel  75 mg Oral Q breakfast  . [START ON 11/07/2013] darbepoetin (ARANESP) injection - DIALYSIS  60 mcg  Intravenous Q Wed-HD  . doxercalciferol  2 mcg Intravenous Q M,W,F-HD  . heparin  5,000 Units Subcutaneous 3 times per day  . insulin aspart  0-9 Units Subcutaneous TID WC  . sodium chloride  3 mL Intravenous Q12H  . vancomycin  125 mg Oral QID   Continuous Infusions:  PRN Meds:.acetaminophen, acetaminophen Assessment/Plan:   [name] is a 73 year old woman with a history of PAF, CAD, aortic stenosis, and ESRD on MWF hemodialysis, s/p recent discharge for Afib with RVR, who presents after receiving 10 minutes of CPR with ROSC after an apparent cardiac arrest during her dialysis session on 10/29/13. She does not remember the event; the AED reportedly showed that the patient was in a non-shockable, idioventricular pattern.   # C diff colitis - Pain resolved, stool frequency improving after 72h on po vancomycin. - continue po vancomycin 125mg  4 times daily  - consider hemorrhoid ointment if rectal pain present after diarrhea improves   # Cardiac Event - putative cardiac arrest reported by HD center, though no rhythm strips obtained at the time. Event consistent with hypotension-induced syncope vs. PEA arrest during dialysis in the setting of severe AS and likely volume depletion from C difficile diarrhea.  Cardiology determined patient not a candidate for open valve replacement.  Patient is high risk for TAVR; nurse coordinator to arrange outpatient follow up with CT surgery for evaluation for TAVR. -on tele -outpatient evaluation for TAVR to be arranged by nurse coordinator  -PFTs completed  for TAVR evaluation -hold metoprolol  -continue amiodarone 200mg  daily  -continue to monitor patient's blood pressure, and maintain preload with cautious IVF boluses as needed  # Severe Aortic Stenosis - Echo 10/06/13 showing EF 60-65%, but aortic valve area 0.45. 24 hr blood pressures ranging 107/38 - 121/41  -TAVR follow up as above  # ESRD - [facility]. Only received about 1  hour of her scheduled treatment 3/2. BUN 17 -> 26, and Cr 2.8 -> 4.4 after HD 3/4; patient received inpatient dialysis today.  K has been stable at 3.6-3.8.  -daily weights  -patient tolerated inpatient hemodialysis well yesterday  # Bright red blood per rectum - Intermittent blood and rectal pain with defecation for the last 3 months per pt report. Colonoscopy with Dr. Jennye BoroughsMisenheimer in 2009 showed external hemorroids, removed hyperplastic polyps with recommended 10 year follow up. This most likely represents hemorrhoidal bleeding (multiple hemorrhoids present on DRE) in the setting of increased stool frequency, vs diverticulosis. Hb currently stable; increased 8.5 -> 9.3.  -no need for GI consult at this time  # Paroxysmal Afib - recently discharged 2/28 after an admission for afib with RVR, discharged on amiodarone taper and metoprolol. Currently in sinus bradycardia; 24 hr HR ranging 49-53.  -hold metoprolol, continued amiodarone at decreased dose as above  -will not receive pacemaker per EP note   # QT prolongation - seen on prior EKG's, though mildly higher on admission compared to prior (QTc = 549 from 504). Etiology includes amiodarone usage vs electrolyte imbalance vs thyroid abnormality (though no history). TSH 1.7, Mg 2.0.  -continue telemetry  -patient on anti-arrhythmic therapy, not candidate for EP procedure   # CAD - s/p CABG x3 in 2011 (LIMA to LAD, vein to circumflex, vein to PDA), s/p cath Nov 2014 (DES to RCA).  -continue aspirin, plavix, statin   # DM2 - On home Novolog 70/30 on a sliding scale, pt unclear on dosing. Per dietician, pt has not been taking insulin at home or checking blood glucose. Patient's A1c is 8.5. Patient has received 1 - 4 units of insulin total per day in hospital. -continue SSI while inpatient  -consider prescription for glucometer and strips on discharge  -patient will need close outpatient follow up to ensure good glycemic control  Dispo: Disposition  is deferred at this time, awaiting complete improvement of C diff diarrhea and consideration of SNF placement due to concern that patient may be weak for self care at home.  Anticipated discharge in approximately 1-2 day(s).   This is a Psychologist, occupationalMedical Student Note.  The care of the patient was discussed with Dr. Glendell DockerKomanski and the assessment and plan formulated with their assistance.  Please see their attached note for official documentation of the daily encounter.   LOS: 5 days   [name], Med Student 11/03/2013, 9:54 AM

## 2013-11-03 NOTE — Progress Notes (Signed)
Subjective:  No complaints, diarrhea resolving  Objective: Vital signs in last 24 hours: Temp:  [97.4 F (36.3 C)-98.7 F (37.1 C)] 97.5 F (36.4 C) (03/07 0510) Pulse Rate:  [59-76] 62 (03/07 0510) Resp:  [16-18] 16 (03/07 0510) BP: (100-164)/(38-71) 117/50 mmHg (03/07 0510) SpO2:  [92 %-100 %] 99 % (03/07 0510) Weight:  [51.4 kg (113 lb 5.1 oz)-53.4 kg (117 lb 11.6 oz)] 51.4 kg (113 lb 5.1 oz) (03/06 1309) Weight change: 0 kg (0 lb)  Intake/Output from previous day: 03/06 0701 - 03/07 0700 In: -  Out: 2000    Lab Results:  Recent Labs  11/01/13 0230 11/02/13 1000  WBC 8.0 5.1  HGB 8.5* 9.3*  HCT 25.7* 28.7*  PLT 232 281   BMET:  Recent Labs  11/01/13 0230 11/02/13 0921  NA 138 132*  K 3.8 4.0  CL 97 94*  CO2 28 24  GLUCOSE 190* 159*  BUN 17 26*  CREATININE 2.83* 4.38*  CALCIUM 8.7 9.0  ALBUMIN  --  2.6*   No results found for this basename: PTH,  in the last 72 hours Iron Studies: No results found for this basename: IRON, TIBC, TRANSFERRIN, FERRITIN,  in the last 72 hours  EXAM:  General appearance: Alert, in no apparent distress  Resp: CTA without rales, rhonchi, or wheezes  Cardio: RRR with Gr III/VI systolic murmur, no rub  GI: + BS, soft and nontender  Extremities: No edema  Access: AVF @ LUA weith + bruit   Dialysis Orders: Dialysis: MWF [place]  3.5h F160 52kg 3K/2.25 [place] L AVF Prof 4 Heparin none  Hectorol 2 Epo 2200 Hgb 10.5 2/18 sats 24% sat and ferritin 1600 in January; iPTH 433 1/28   Assessment/Plan: 1. Cardiac arrest - during HD 3/2, s/p CPR for 10 mins; Hx CAD s/p CABG 201, A-fib, and severe AS: seen by Cardiology, high risk for AVR, but surgery consult as outpatient pending for possible TAVR. Amio was decreased and there have been no other "events" here 2. C diff colitis - resolving on PO Vancomycin. 3. Hematochezia - intermittent for 3 mos, likely sec to hemorrhoids vs diverticulosis, Hgb better.  4. ESRD - HD on MWF via AVF @ [facility],  K 4.  Next HD on Mon. Have increased time to 4 hours for more gentle UF given history 5. HTN/Volume - BP 117/50 stable, wt 51.4 kg s/p net UF 2 L yesterday.  6. Anemia - Hgb 9.3, Aranesp increased to 60 mcg on Wed.  7. Sec HPT - Ca 9 (10.1 corrected), P 3.7; Hectorol 2 mcg, no binder.  8. Nutrition - Alb 2.6, renal diet & vitamin.  9. A-fib - on Amiodarone, Plavix.  10. DM - insulin per primary.  11. COPD    LOS: 5 days   LYLES,CHARLES 11/03/2013,7:05 AM  Patient seen and examined, agree with above note with above modifications. Pt seen, has been looking better each day.  Cardiac status is stable at this time and diarrhea is slowly resolving, possibly home soon Annie SableKellie Keiva Dina, MD 11/03/2013

## 2013-11-03 NOTE — Progress Notes (Signed)
I agree with med student plan. Please see my note for additional details.  Angelina Sheriffhris Taimi Towe, MD PGY1

## 2013-11-03 NOTE — Progress Notes (Signed)
Subjective:  NAE ON. Patient reports one solid BM this morning. She is feeling much better, no abdominal pain.  Objective: Vital signs in last 24 hours: Filed Vitals:   11/02/13 1408 11/02/13 1646 11/02/13 2141 11/03/13 0510  BP: 114/38 104/44 122/39 117/50  Pulse: 62 76 69 62  Temp: 98.3 F (36.8 C) 97.4 F (36.3 C) 98.7 F (37.1 C) 97.5 F (36.4 C)  TempSrc: Oral Axillary Oral Oral  Resp:  18 16 16   Height:      Weight:      SpO2: 100% 98% 97% 99%   Weight change: 0 lb (0 kg)  Intake/Output Summary (Last 24 hours) at 11/03/13 0932 Last data filed at 11/02/13 1309  Gross per 24 hour  Intake      0 ml  Output   2000 ml  Net  -2000 ml   Physical Exam  Constitutional: She is oriented to person, place, and time. She appears well-developed and well-nourished. No distress.  Cardiovascular: Intact distal pulses.   Murmur heard. Pulmonary/Chest: Effort normal and breath sounds normal. No respiratory distress. She has no wheezes. She has no rales.  Abdominal: Soft. Bowel sounds are normal. She exhibits no distension. There is no tenderness. There is no rebound and no guarding.  Neurological: She is alert and oriented to person, place, and time.  Skin: She is not diaphoretic.  Psychiatric: She has a normal mood and affect. Her behavior is normal.    Lab Results: Basic Metabolic Panel:  Recent Labs Lab 10/29/13 1635 10/30/13 0645  11/01/13 0230 11/02/13 0921  NA  --  142  < > 138 132*  K  --  3.6*  < > 3.8 4.0  CL  --  99  < > 97 94*  CO2  --  26  < > 28 24  GLUCOSE  --  90  < > 190* 159*  BUN  --  33*  < > 17 26*  CREATININE  --  4.64*  < > 2.83* 4.38*  CALCIUM  --  9.3  < > 8.7 9.0  MG 2.0  --   --   --   --   PHOS  --  5.5*  --   --  3.7  < > = values in this interval not displayed. Liver Function Tests:  Recent Labs Lab 10/29/13 0944 10/30/13 0645 11/02/13 0921  AST 27  --   --   ALT 20  --   --   ALKPHOS 157*  --   --   BILITOT 0.4  --   --    PROT 7.2  --   --   ALBUMIN 3.5 2.8* 2.6*   CBC:  Recent Labs Lab 10/31/13 0845 11/01/13 0230 11/02/13 1000  WBC 11.8* 8.0 5.1  NEUTROABS 9.2* 5.6  --   HGB 9.1* 8.5* 9.3*  HCT 26.8* 25.7* 28.7*  MCV 90.5 92.8 92.9  PLT 307 232 281   Cardiac Enzymes:  Recent Labs Lab 10/29/13 1635 10/29/13 2145  TROPONINI <0.30 <0.30   CBG:  Recent Labs Lab 11/01/13 1651 11/01/13 2138 11/02/13 1406 11/02/13 1643 11/02/13 2135 11/03/13 0758  GLUCAP 215* 174* 104* 172* 281* 259*   Thyroid Function Tests:  Recent Labs Lab 10/29/13 1635  TSH 1.710   Micro Results: Recent Results (from the past 240 hour(s))  MRSA PCR SCREENING     Status: None   Collection Time    10/29/13  1:59 PM      Result  Value Ref Range Status   MRSA by PCR NEGATIVE  NEGATIVE Final   Comment:            The GeneXpert MRSA Assay (FDA     approved for NASAL specimens     only), is one component of a     comprehensive MRSA colonization     surveillance program. It is not     intended to diagnose MRSA     infection nor to guide or     monitor treatment for     MRSA infections.  CLOSTRIDIUM DIFFICILE BY PCR     Status: Abnormal   Collection Time    10/30/13  2:55 PM      Result Value Ref Range Status   C difficile by pcr POSITIVE (*) NEGATIVE Final   Comment: CRITICAL RESULT CALLED TO, READ BACK BY AND VERIFIED WITH:     STANFIELD RN 17:20 10/30/13 ([email or web address])   Studies/Results: No results found. Medications: I have reviewed the patient's current medications. Scheduled Meds: . amiodarone  200 mg Oral Daily  . aspirin  81 mg Oral Daily  . atorvastatin  40 mg Oral q1800  . clopidogrel  75 mg Oral Q breakfast  . [START ON 11/07/2013] darbepoetin (ARANESP) injection - DIALYSIS  60 mcg Intravenous Q Wed-HD  . doxercalciferol  2 mcg Intravenous Q M,W,F-HD  . heparin  5,000 Units Subcutaneous 3 times per day  . insulin aspart  0-9 Units Subcutaneous TID WC  . sodium chloride  3 mL Intravenous Q12H   . vancomycin  125 mg Oral QID   Continuous Infusions:  PRN Meds:.acetaminophen, acetaminophen Assessment/Plan: Principal Problem:   Clostridium difficile colitis Active Problems:   End stage renal disease   DM (diabetes mellitus)   HTN (hypertension)   Aortic stenosis, moderate at cath 07/11/13   PAF (paroxysmal atrial fibrillation)   Atrial fibrillation   Cardiac arrest   Sinus bradycardia   Aortic stenosis  # C diff colitis - Pt significantly improved with resolved diarrhea and no abdominal pain. - continue po vancomycin 125mg  4 times daily  - Up to chair today. - PT OT eval for likely SNF placement.  # Cardiac Event - cardiac arrest reported by HD center. Event appears more consistent with hypotension-induced syncope during dialysis in the setting of severe AS and likely volume depletion from C difficile diarrhea. EP evaluation concluded that primary bradycardia was unlikely, and that patient is not a candidate for EP procedure at this time. Patient tolerated HD well. - tele -cardiology consulted for management of severe afterload dependent AS in setting of ESRD  -hold metoprolol  -continue amiodarone 200mg  daily  -continue to monitor patient's blood pressure, and maintain afterload with cautious IVF boluses as needed   # Severe Aortic Stenosis - Echo 10/06/13 showing EF 60-65%, but aortic valve area 0.45. 24 hr blood pressures ranging 107/38 - 121/41  -TAVR eval underway by cardiology team, rest of eval to be complete at outpatient. -monitor volume status  - maintain MAP >60 with 250 cc fluid boluses.  # ESRD - on MWF HD. - HD per nephrology  # Bright red blood per rectum - Intermittent blood likely due to known internal and external hemorrhoids. Pt has had no frank blood in stool during this admission.  # Paroxysmal Afib - recently discharged 2/28 after an admission for afib with RVR, discharged on amiodarone taper and metoprolol. Currently in sinus bradycardia; 24 hr HR  ranging 49-53.  -hold metoprolol, continued amiodarone  at decreased dose as above  -no indication for pacemaker per EP note   # QT prolongation - seen on prior EKG's, though mildly higher on admission compared to prior (QTc = 504 at this time, similar to baseline).  - tele  # CAD - s/p CABG x3 in 2011 (LIMA to LAD, vein to circumflex, vein to PDA), s/p cath Nov 2014 (DES to RCA).  -continue aspirin, plavix, statin   # DM2 - On home Novolog 70/30 on a sliding scale, pt unclear on dosing. Per dietician, pt has not been taking insulin at home or checking blood glucose. Patient's A1c is 8.5. Patient has received 1 - 4 units of insulin total per day in hospital. -continue SSI while inpatient  - will need outpatient f/u regarding DM  Dispo: Disposition is deferred at this time, awaiting improvement of current medical problems.  Anticipated discharge in approximately 1-3 day(s).   The patient does have a current PCP (Imran Clarnce Flock, MD) and does not need an [facility] hospital follow-up appointment after discharge.  The patient does have transportation limitations that hinder transportation to clinic appointments.  .Services Needed at time of discharge: Y = Yes, Blank = No PT:   OT:   RN:   Equipment:   Other:     LOS: 5 days   Pleas Koch, MD 11/03/2013, 9:32 AM

## 2013-11-03 NOTE — Consult Note (Addendum)
[facility]   MULTIDISCIPLINARY HEART VALVE CLINIC      CARDIOTHORACIC SURGERY CONSULTATION REPORT  PCP is HAGUE, Myrene Galas, MD Referring Provider is Kathleene Hazel, MD Primary Cardiologist is Lennette Bihari, MD   Reason for consultation:  Severe aortic stenosis  HPI:  Patient is [name] from [place] with complex medical history including known history of aortic stenosis, coronary artery disease status post coronary artery bypass grafting x3 in 2011, end-stage renal disease for which she has been dialysis dependent, hypertension, type 2 diabetes mellitus, COPD, chronic diastolic congestive heart failure, pulmonary hypertension with chronic right-sided heart failure, and recurrent paroxysmal atrial fibrillation.  The patient is not a very good historian but tells me that she has been dialysis dependent for nearly 10 years.  In February of 2011 she presented with acute non-ST segment elevation myocardial infarction complicated by class IV congestive heart failure. She was found to have severe three-vessel coronary artery disease and mild aortic stenosis. She underwent coronary artery bypass grafting x3 by Dr. Donata Clay with grafts placed at the time of surgery including left internal mammary artery to distal left anterior same coronary artery, saphenous vein graft to the obtuse marginal branch of left circumflex coronary artery and saphenous vein graft to the posterior descending coronary artery. The patient's postoperative recovery was somewhat slow and she developed a recurrent left pleural effusion requiring thoracentesis in April 2011. She has been followed intermittently since that time by Dr. Tresa Endo and follow up echocardiograms have demonstrated progression of the severity of aortic stenosis.  In November of 2014 the patient was admitted to the hospital with chest pain and rapid atrial fibrillation which developed during hemodialysis.  She ruled in for an acute non-ST segment elevation myocardial infarction.  She underwent left and right heart catheterization by Dr. Tresa Endo demonstrating severe three-vessel coronary artery disease with chronic occlusion of both vein grafts placed at the time of the patient's previous bypass surgery. The left internal mammary artery graft remained patent to the left anterior descending coronary artery. The left circumflex coronary artery was chronically occluded. There was high-grade proximal stenosis and is diffusely diseased right coronary artery. She was also noted to have severe pulmonary hypertension and moderate aortic stenosis at catheterization with peak to peak transvalvular gradient reported 32 mm mercury corresponding to a calculated aortic valve area of 1.1 centimeters squared.  The patient was also noted to have severely calcified ascending thoracic aorta. She was seen in consultation by Dr. Donata Clay who felt that she should not be considered candidate for conventional surgical aortic valve replacement and redo coronary artery bypass grafting. She subsequently underwent PCI with rotational atherectomy and stenting of the right coronary artery using overlapping drug-eluting stents.  She was hospitalized again in early February 2015 with rapid atrial fibrillation and another non-ST segment elevation myocardial infarction. Repeat left heart catheterization was performed demonstrating that the stents in the right coronary artery remained patent although incompletely expanded.  She was discharged from the hospital but readmitted just 2 weeks later with recurrent paroxysmal atrial fibrillation, chest pain, and acute exacerbation of chronic diastolic and right sided congestive heart failure with a large right pleural effusion. She underwent right thoracentesis. She was started on amiodarone during that hospitalization for recurrent atrial tachycardia versus atypical atrial flutter during hemodialysis. She was  ultimately discharged from the hospital again. On 10/29/2013 the patient suffered a PEA arrest on dialysis. CPR was initiated and an AED was  applied. She required approximately 10 minutes of CPR. She did not require shock and did not get any medications. After she regained pulses, she was conscious and alert. She had no complaints of chest pain or shortness of breath. The AED was interrogated afterwards and showed an idioventricular rhythm.  She was admitted to the hospital and subsequently developed diarrhea consistent with C. Difficile colitis. She was seen in consultation by Dr Johney Frame from the EP service who felt that palliative care consultation might be most reasonable.  Her colitis has since then resolved and the patient was seen in consultation by Dr. Clifton James from the multidisciplinary heart valve team to consider transcatheter aortic valve replacement. Repeat cardiothoracic surgical consultation has been requested.  The patient is not a very good historian and is evaluated in her hospital room. There are no family members present. She states that she still lives alone although she has several children who live nearby and assist her with her day to day needs.  She does not recall how many children she has. She states that her husband passed away a few years ago, although she cannot recall when. She states that she thinks she still has an automobile, although her children convinced her to stop driving sometime ago within the past year. She admits that over the past year she has gone downhill from a functional standpoint. She states that she gets tired very easily and cannot do as much as she used to in the past.  She denies any problems with shortness of breath although she admits that her breathing has been better since she was given oxygen at home recently. She intermittently has chest pain during dialysis. She denies any dizzy spells, PND, orthopnea, or lower extremity edema. She states that her mobility  is limited primarily by fatigue and generalized weakness.  She has fallen on one occasion because of instability of gait.  Past Medical History  Diagnosis Date  . CHF (congestive heart failure)     diast SHF, RV failure, pulm HTN  . Diabetes mellitus   . ESRD on hemodialysis     HD MWF in [place]   . Hyperlipidemia   . Anemia   . Hypertension   . COPD (chronic obstructive pulmonary disease)     severe with FEV1 0.64 L  . Peripheral vascular disease   . Coronary artery disease     Hx CABG. Cath 07/06/13 for CP episode showed occlusive graft disease and moderate AS; pt was not a candidate for repeat surgery and a complex PCI/rotablator/stenting procedure of calcified native RCA was done by cardiology  . PAF (paroxysmal atrial fibrillation)     recurrent  . Chronic right-sided heart failure   . Aortic stenosis     Past Surgical History  Procedure Laterality Date  . Coronary artery bypass graft  10/20/2009    x3 by Dr Maren Beach with LIMA to the LAD, a vein to the circumflex and a vien to the PDA.  Marland Kitchen Abdominal hysterectomy    . Thoracentesis Left 12/17/2009  . Av fistula placement  01-12-2008    left upper arm  . Cardiac catheterization  10/15/2009    which showed low normal LV function with mild inferior hypocontractility.  . Thoracentesis Right 10/27/2013    Family History  Problem Relation Age of Onset  . Heart disease Mother     History   Social History  . Marital Status: Widowed    Spouse Name: N/A    Number of Children: N/A  .  Years of Education: N/A   Occupational History  . Retired    Social History Main Topics  . Smoking status: Former Smoker -- 0.50 packs/day    Types: Cigarettes    Start date: 06/19/1973  . Smokeless tobacco: Never Used  . Alcohol Use: No  . Drug Use: No  . Sexual Activity: No   Other Topics Concern  . Not on file   Social History Narrative   The patient currently lives at home with a friend "Trinna Post", who helps her around the house.   The patient manages her own medications, using a pill box.    Prior to Admission medications   Medication Sig Start Date End Date Taking? Authorizing Provider  amiodarone (PACERONE) 200 MG tablet Take 2 tabs (400 mg) twice daily for 7 days, then take 2 tabs (400 mg) once daily for 3 weeks then take 1 tab (200 mg) once daily thereafter 10/27/13  Yes Brooke O Edmisten, PA-C  aspirin 81 MG chewable tablet Chew 1 tablet (81 mg total) by mouth daily. 07/13/13  Yes Abelino Derrick, PA-C  atorvastatin (LIPITOR) 40 MG tablet Take 1 tablet (40 mg total) by mouth daily at 6 PM. 07/13/13  Yes Abelino Derrick, PA-C  clopidogrel (PLAVIX) 75 MG tablet Take 1 tablet (75 mg total) by mouth daily with breakfast. 07/13/13  Yes Eda Paschal Kilroy, PA-C  insulin aspart (NOVOLOG) 100 UNIT/ML injection Inject 2-10 Units into the skin See admin instructions. Sliding scale. 151-200 use 2 units; 201-250 use 4 units; 251-300 use 6 units; 301-350 use 8 units; 351-400 use 10 units   Yes Historical Provider, MD  metoprolol tartrate (LOPRESSOR) 25 MG tablet Take 0.5 tablets (12.5 mg total) by mouth 2 (two) times daily. 10/27/13  Yes Brooke O Edmisten, PA-C  multivitamin (RENA-VIT) TABS tablet Take 1 tablet by mouth at bedtime. 07/13/13  Yes Luke K Kilroy, PA-C  nitroGLYCERIN (NITROSTAT) 0.4 MG SL tablet Place 1 tablet (0.4 mg total) under the tongue every 5 (five) minutes x 3 doses as needed for chest pain. 07/13/13  Yes Abelino Derrick, PA-C    Current Facility-Administered Medications  Medication Dose Route Frequency Provider Last Rate Last Dose  . acetaminophen (TYLENOL) tablet 650 mg  650 mg Oral Q6H PRN Linward Headland, MD   650 mg at 11/03/13 1356   Or  . acetaminophen (TYLENOL) suppository 650 mg  650 mg Rectal Q6H PRN Linward Headland, MD      . amiodarone (PACERONE) tablet 200 mg  200 mg Oral Daily Rhonda G Barrett, PA-C   200 mg at 11/03/13 1021  . aspirin chewable tablet 81 mg  81 mg Oral Daily Linward Headland, MD   81 mg at 11/03/13  1022  . atorvastatin (LIPITOR) tablet 40 mg  40 mg Oral q1800 Linward Headland, MD   40 mg at 11/02/13 1747  . clopidogrel (PLAVIX) tablet 75 mg  75 mg Oral Q breakfast Linward Headland, MD   75 mg at 11/03/13 1610  . [START ON 11/07/2013] darbepoetin (ARANESP) injection 60 mcg  60 mcg Intravenous Q Wed-HD Cecille Aver, MD      . doxercalciferol (HECTOROL) injection 2 mcg  2 mcg Intravenous Q M,W,F-HD Sheffield Slider, PA-C   2 mcg at 11/02/13 1300  . heparin injection 5,000 Units  5,000 Units Subcutaneous 3 times per day Linward Headland, MD   5,000 Units at 11/03/13 [phone]  . insulin aspart (novoLOG) injection 0-9 Units  0-9 Units Subcutaneous TID WC Farley Ly, MD   2 Units at 11/03/13 1246  . sodium chloride 0.9 % injection 3 mL  3 mL Intravenous Q12H Linward Headland, MD   3 mL at 11/03/13 1023  . vancomycin (VANCOCIN) 50 mg/mL oral solution 125 mg  125 mg Oral QID Colleen Can, RPH   125 mg at 11/03/13 1245    Allergies  Allergen Reactions  . Codeine Rash  . Sulfa Antibiotics Other (See Comments)    unknown      Review of Systems:   General:  poor appetite, decreased energy, no weight gain, + weight loss, no fever  Cardiac:  no chest pain with exertion, + chest pain at rest during dialysis,  + SOB with exertion, no resting SOB, no PND, no orthopnea, no palpitations, + arrhythmia, + atrial fibrillation, no LE edema, no dizzy spells, no syncope  Respiratory:  + shortness of breath, + home oxygen, no productive cough, no dry cough, no bronchitis, no wheezing, no hemoptysis, no asthma, no pain with inspiration or cough, no sleep apnea, no CPAP at night  GI:   no difficulty swallowing, no reflux, no frequent heartburn, no hiatal hernia, no abdominal pain, no constipation, + diarrhea, no hematochezia, no hematemesis, no melena  GU:   no dysuria,  no frequency, no urinary tract infection, no hematuria, no kidney stones, + end-stage kidney disease on dialysis via L upper arm fistula  every MWF at [facility]  Vascular:  no pain suggestive of claudication, no pain in feet, no leg cramps, no varicose veins, no DVT, + non-healing foot ulcer  Neuro:   no stroke, no TIA's, no seizures, no headaches, no temporary blindness one eye,  no slurred speech, no peripheral neuropathy, no chronic pain, + some instability of gait, + some memory/cognitive dysfunction  Musculoskeletal: no arthritis, no joint swelling, no myalgias, + mild difficulty walking, limited mobility   Skin:   + rash, + itching, no skin infections, no pressure sores or ulcerations  Psych:   no anxiety, no depression, no nervousness, no unusual recent stress  Eyes:   + blurry vision, no floaters, no recent vision changes, does not wears glasses or contacts  ENT:   no hearing loss, no loose or painful teeth, + dentures, cannot recall when she last saw dentist   Hematologic:  no easy bruising, + abnormal bleeding, no clotting disorder, + frequent epistaxis  Endocrine:  + diabetes, sometimes checks CBG's at home     Physical Exam:   BP 122/51  Pulse 76  Temp(Src) 98.4 F (36.9 C) (Oral)  Resp 18  Ht 5\' 4"  (1.626 m)  Wt 51.4 kg (113 lb 5.1 oz)  BMI 19.44 kg/m2  SpO2 99%  General:  Chronically ill and frail-appearing  HEENT:  Unremarkable other than poor dentition  Neck:   no JVD, no bruits, no adenopathy   Chest:   clear to auscultation, symmetrical breath sounds, no wheezes, no rhonchi   CV:   RRR, grade III/VI systolic murmur   Abdomen:  soft, non-tender, no masses   Extremities:  warm, adequately perfused, pulses not palpable, no lower extremity edema, two abrasions right knee and lower leg, slowly healing, no cellulitis  Rectal/GU  Deferred  Neuro:   Grossly non-focal and symmetrical throughout  Skin:   Clean and dry, + chronic scaly skin   Diagnostic Tests:  Transthoracic Echocardiography  Patient: [name], [name] MR #: [id number] Study Date: 10/06/2013 Gender: F Age: 29 Height:  162.6cm Weight:  53.4kg BSA: 1.90m^2 Pt. Status: Room: 2H16C  ADMITTING Ron Parker SONOGRAPHER Silvano Bilis, RCS ORDERING [place], [place], [place] ATTENDING Marlin Canary PERFORMING [facility], Inpatient cc:  ------------------------------------------------------------ LV EF: 60% - 65%  ------------------------------------------------------------ Indications: 424.1, Aortic stenosis. 410.70, NSTEMI.  ------------------------------------------------------------ History: PMH: Chest pain. Coronary artery disease. Chronic obstructive pulmonary disease. Risk factors: Current tobacco use. Hypertension. Diabetes mellitus. Dyslipidemia.  ------------------------------------------------------------ Study Conclusions  - Left ventricle: The cavity size was normal. There was moderate concentric hypertrophy. Systolic function was normal. The estimated ejection fraction was in the range of 60% to 65%. Wall motion was normal; there were no regional wall motion abnormalities. Doppler parameters are consistent with restrictive physiology, indicative of decreased left ventricular diastolic compliance and/or increased left atrial pressure. - Aortic valve: There was moderate to severe stenosis. Valve area: 0.45cm^2(VTI). Valve area: 0.45cm^2 (Vmax). - Mitral valve: Calcified annulus. Mildly thickened, mildly calcified leaflets . Mild regurgitation directed centrally. Valve area by continuity equation (using LVOT flow): 0.91cm^2. - Left atrium: The atrium was mildly dilated. - Right ventricle: The cavity size was mildly dilated. Systolic function was moderately to severely reduced. - Right atrium: The atrium was mildly dilated. - Tricuspid valve: Moderate-severe regurgitation directed centrally. The acceleration rate of the regurgitant jet was reduced, consistent with a low dP/dt. - Pulmonary arteries: PA peak pressure: 52mm Hg  (S).  ------------------------------------------------------------ Labs, prior tests, procedures, and surgery: Coronary artery bypass grafting.  Transthoracic echocardiography. M-mode, complete 2D, spectral Doppler, and color Doppler. Height: Height: 162.6cm. Height: 64in. Weight: Weight: 53.4kg. Weight: 117.5lb. Body mass index: BMI: 20.2kg/m^2. Body surface area: BSA: 1.3m^2. Blood pressure: 121/45. Patient status: Inpatient. Location: ICU/CCU.  ------------------------------------------------------------  ------------------------------------------------------------ Left ventricle: The cavity size was normal. There was moderate concentric hypertrophy. Systolic function was normal. The estimated ejection fraction was in the range of 60% to 65%. Wall motion was normal; there were no regional wall motion abnormalities. Doppler parameters are consistent with restrictive physiology, indicative of decreased left ventricular diastolic compliance and/or increased left atrial pressure.  ------------------------------------------------------------ Aortic valve: The aortic jet remains early peaking. Trileaflet; moderately thickened, mildly calcified leaflets. Doppler: There was moderate to severe stenosis. VTI ratio of LVOT to aortic valve: 0.17. Valve area: 0.45cm^2(VTI). Indexed valve area: 0.29cm^2/m^2 (VTI). Peak velocity ratio of LVOT to aortic valve: 0.17. Valve area: 0.45cm^2 (Vmax). Indexed valve area: 0.29cm^2/m^2 (Vmax). Mean gradient: 43mm Hg (S). Peak gradient: 81mm Hg (S).  ------------------------------------------------------------ Mitral valve: Calcified annulus. Mildly thickened, mildly calcified leaflets . Leaflet separation was normal. Doppler: Transvalvular velocity was within the normal range. There was no evidence for stenosis. Mild regurgitation directed centrally. Valve area by pressure half-time: 5.01cm^2. Indexed valve area by pressure half-time:  3.23cm^2/m^2. Valve area by continuity equation (using LVOT flow): 0.91cm^2. Indexed valve area by continuity equation (using LVOT flow): 0.59cm^2/m^2. Mean gradient: 2mm Hg (D). Peak gradient: 8mm Hg (D).  ------------------------------------------------------------ Left atrium: The atrium was mildly dilated.  ------------------------------------------------------------ Right ventricle: The cavity size was mildly dilated. Systolic function was moderately to severely reduced.  ------------------------------------------------------------ Pulmonic valve: Structurally normal valve. Cusp separation was normal. Doppler: Transvalvular velocity was within the normal range. No regurgitation.  ------------------------------------------------------------ Tricuspid valve: Structurally normal valve. Leaflet separation was normal. Doppler: Transvalvular velocity was within the normal range. Moderate-severe regurgitation directed centrally. The acceleration rate of the regurgitant jet was reduced, consistent with a low dP/dt.  ------------------------------------------------------------ Right atrium: The atrium was mildly dilated.  ------------------------------------------------------------ Pericardium: There was no pericardial effusion.  ------------------------------------------------------------ Systemic veins: Inferior vena  cava: The vessel was dilated; the respirophasic diameter changes were blunted (< 50%); findings are consistent with elevated central venous pressure.  ------------------------------------------------------------  2D measurements Normal Doppler measurements Norma Left ventricle l LVID ED, 42.4 mm 43-52 Main pulmonary artery chord, Pressure, 52 mm Hg =30 PLAX S LVID ES, 26.1 mm 23-38 Left ventricle chord, LVET 370 ms ----- PLAX LVOT FS, chord, 38 % >29 Peak vel, 77.1 cm/s ----- PLAX S LVPW, ED 13.2 mm ------ VTI, S 18.6 cm ----- IVS/LVPW 1.05 <1.3 Stroke  48.9 ml ----- ratio, ED vol Ventricular septum Stroke 31.5 ml/m^2 ----- IVS, ED 13.8 mm ------ index LVOT Aortic valve Diam, S 18.3 mm ------ Peak vel, 451.08 cm/s ----- Area 2.63 cm^2 ------ S Aorta Mean vel, 308.02 cm/s ----- Root diam, 23 mm ------ S ED VTI, S 111.71 cm ----- Left atrium Mean 43 mm Hg ----- AP dim 43 mm ------ gradient, AP dim 2.77 cm/m^2 <2.2 S index Peak 81 mm Hg ----- gradient, S VTI ratio 0.17 ----- LVOT/AV Area, VTI 0.45 cm^2 ----- Area 0.29 cm^2/m ----- index ^2 (VTI) Peak vel 0.17 ----- ratio, LVOT/AV Area, 0.45 cm^2 ----- Vmax Area 0.29 cm^2/m ----- index ^2 (Vmax) Mitral valve Peak E 136.97 cm/s ----- vel Peak A 52.4 cm/s ----- vel Mean vel, 61.1 cm/s ----- D Decelerat 904.66 cm/s^2 ----- ion slope Decelerat 151 ms 150-2 ion time 30 Pressure 44 ms ----- half-time Mean 2 mm Hg ----- gradient, D Peak 8 mm Hg ----- gradient, D Peak E/A 2.61 ----- ratio Area 5.01 cm^2 ----- (PHT) Area 3.23 cm^2/m ----- index ^2 (PHT) Area 0.91 cm^2 ----- (LVOT) continuit y Area 0.59 cm^2/m ----- index ^2 (LVOT cont) Annulus 46.2 cm ----- VTI Tricuspid valve Regurg 323 cm/s ----- peak vel Peak 42 mm Hg ----- RV-RA gradient, S Systemic veins Estimated 10 mm Hg ----- CVP Right ventricle Pressure, 52 mm Hg <30 S  ------------------------------------------------------------ Prepared and Electronically Authenticated by  Croitoru, Mihai 2015-02-07T13:15:[id number]         CARDIAC CATHETERIZATION   HISTORY:  Ms. Solanki is a 73 year old African American female with end-stage renal disease who undergoes dialysis and [place] on Monday Wednesday and Fridays. She suffered a non-ST segment elevation MI in 2011 and underwent CABG surgery by Dr. Morton Peters with a LIMA to the LAD, vein to circumflex and vein to the PDA. At that time she did have mild aortic stenosis. Reportedly, an echo Doppler study several months ago suggested severe  aortic stenosis. Recently, she had developed episodes of chest tightness during dialysis and also has noticed some shortness of breath. She was seen in the office and was scheduled to undergo right and left heart catheterization. This week, while in dialysis in [place] developed atrial fibrillation and chest pain. Troponin was positive at 10. She was transported to [facility].  She has undergone dialysis this morning. She now presents for right left heart catheterization.  PROCEDURE: The patient was brought to the second floor [facility] cath lab in the postabsorptive state. She was premedicated with Versed 2 mg and fentanyl 25 mcg intravenously. Her right groin was prepped and shaved in usual sterile fashion. Xylocaine 1% was used for local anesthesia. A 6 French arterial and 7 French venous sheath were inserted. Right heart catheterization was performed and hemodynamic recording was obtained in the right atrium, right ventricle, pulmonary artery and pulmonary capillary wedge positions. Thermodilution cardiac output was obtained. Oxygen saturation was obtained in the pulmonary artery. A 5 French pigtail  catheter was inserted into the central aorta and central aorta oxygen saturation was obtained. Simultaneous aorta and pulmonary artery pressures were recorded. Initial attempt at passing the pigtail catheter across the aortic stenosis was unsuccessful. Supravalvular aortography was performed in the LAO projection. Attention was then directed at the coronary arteries and grafts. A 5 [place] FL4 catheter was inserted and diagnostic angiography was done in the left coronary system. A 5 [place]  FR4 catheter for selective angiography into the native right coronary artery. This was also used for selective angiography into both vein grafts which were occluded at the ostium. A left bypass graft catheter was also inserted for improved visualization of the nubbin.A LIMA catheter was used for selective angiography  in the left internal mammary artery. A straight wire was then inserted and was able to cross the aortic valve into the left ventricle and the LIMA catheter was exchanged for a pigtail catheter. Left ventricular pressures were recorded and simultaneous LV and PC pressures were recorded. Left ventriculography was performed in the RAO projection. A careful LV to AO pullback was performed. Hemostasis was obtained by direct finger pressure. The patient tolerated the procedure well.   HEMODYNAMICS:    RA: mean 11 RV: 74/6/14 PA: 74/19  Mean 38 Pc: 17  Pullback: LV: 152/13/20 Ao: 120.41  CO: Thermo 3.6l/min;  Fick 5.3 CI:                  2.3l/mi/m2;     3.6  AVA: 1.1 cm    Oxygen Saturation:  PA: 69%                                   Ao: 95%  ANGIOGRAPHY:  Fluoroscopy reveals severe coronary calcification as well as significant calcification of the aortic valve.  Supravalvular aortography revealed markedly reduced aortic valve excursion with trivial aortic insufficiency.  1. Left main: Normal large vessel which bifurcated into an LAD and left circumflex vessel.   2. LAD: Calcified vessel with 60% proximal stenosis in the region of the diagonal takeoff with an 80% stenosis in the small diagonal vessel. The LAD extended to the LV apex. 3. Left circumflex: Large proximal vessel. The first marginal branch was occluded in the proximal segment. Minimal thinning of filling was seen beyond this. The vessel is extensively calcified beyond the occlusion. 4. Right coronary artery: Severely calcified vessel with 40% proximal stenosis followed by 90% calcified stenosis in the midsegment. The region of the acute margin there was 95% very calcified stenosis. The distal continuation branch RCA was small caliber and has diffuse tapering of 60% 5.LIMA TO LAD: Patent but do competitive filling via the native LAD visualization of the LAD beyond the LIMA was not brisk flow 6. SVG TO circumflex marginal vessel  was totally occluded at the ostium 7.   SVG TO PDA branch and right coronary artery was totally occluded at the ostium 8.  Left ventriculography: Revealed left ventricle hypertrophy with preserved LV contractility with an ejection fraction of approximately 55%. There was 2+ angiographic mitral regurgitation.  IMPRESSION:  Left ventriclular hypertrophy with preserved LV function with 2+ angiographic mitral regurgitation Severe pulmonary hypertension. Moderate aortic valve stenosis with a peak to peak gradient of 32 mm and a calculated aortic valve area of 1.1 cm. Severely calcified coronary arteries with 60% proximal LAD stenosis, 80% diagonal stenosis; total occlusion of a large calcified obtuse marginal branch of the  left circumflex coronary artery; and diffusely diseased severely calcified right coronary artery with 40% proximal 90% mid and 95% stenosis in the region of the acute margin with diffuse 60% narrowing beyond the PDA takeoff. Patent LIMA to the LAD but without brisk flow distally due to competitive filling from the native LAD injection. Occluded vein graft which supplied the obtuse marginal branch of the circumflex. Occluded vein graft that supplied the PDA.  RECOMMENDATION: Angiographic films will be reviewed with colleagues. The patient has occlusion of both vein grafts and has a patent LIMA to the LAD. Aortic stenosis is moderate to moderately severe with markedly reduced aortic valve excursion of the valve area of 1.1 cm. If percutaneous revascularization is to be considered high-speed rotational atherectomy of the diffusely diseased RCA will need to be performed. The LIMA is patent. The proximal LAD stenosis 60%. The left circumflex vessel is not bypassable.  Lennette Biharihomas A. Kelly, MD, [facility] 07/06/2013 6:28 PM      COMPLEX PCI: HSRA/CUTTING BALLOON/PTCA/STENT/TEMPORARY PACEMAKER  HISTORY: Ms. Royetta Crochetpate he Huston FoleyBrady is a 73 year old African American female with end-stage renal disease who  suffered a non ST segment elevation MI in 2001 and underwent CABG surgery with LIMA to the LAD, vein to the circumflex, and vein to the PDA. She did have mild aortic valve stenosis at that time. She recently has progressed to severe aortic stenosis. She has developed recurrent episodes of chest tightness during dialysis. She recently presented to: Hospital after developing chest pain during dialysis and had atrial fibrillation. She ruled in for non-ST segment elevation myocardial infarction. On 07/07/2003 teens she underwent cardiac catheterization which revealed a diffusely calcified severely diseased right coronary artery with an occluded vein graft that supplied the PDA, and occluded vein graft which supplied the obtuse marginal branch of the circumflex vessel and a patent LIMA to the LAD. She also had moderate aortic valve stenosis. She was turned down for reconsideration of repeat surgery. She now presents for complex percutaneous coronary intervention with planned high-speed rotational atherectomy of her severely calcified right coronary artery.  PROCEDURE:  The patient was brought to the second floor [place] [facility] cath lab in the postabsorptive state. Versed 1 mg and fentanyl 25 mcg were initially administered for conscious sedation. Right femoral artery was punctured anteriorly and a 6 French arterial sheath was inserted. The right femoral vein was punctured anteriorly and a venous sheath was inserted. A transvenous temporary pacemaker was then advanced to the RV apex with demonstration of excellent capture and thresholds. A 6 [place] FR4 guide was inserted. The patient received aspirin and 300 mg of Plavix. Angiomax bolus plus infusion was administered and ACT was documented to be therapeutic. A Rotafloppy wire was able to be advanced into the right coronary artery and ultimately was able to cross all 3 lesions. Initially, a 1.5 mm Roto burr was platformed outside the body at 175,000 revolutions per  minute. The burr was able to advanced into the RCA and diffuse high-speed rotational atherectomy of the proximal, mid, and mid-distal area was undertaken. There was significant slowing of the burr speed when initially attempting to cross the 99% stenotic most distal lesion but ultimately this was successful. The burr was then successfully dynaglided out. A 2.5x6 mm [employer] Flextone cutting balloon was then inserted and several dilatations were made at several sites. This was then exchanged for a 3.0x10 mm cutting balloon and several inflations were again made at multiple sites. An Emerge Monorail 3.5x20 mm balloon was then inserted  and low-level inflations were made at all sites which had undergone high-speed rotational atherectomy. This was then removed and exchanged for an initial Promus Premier 3.0x38 mm DES stent. Tthe stent was ultimately able to be advanced to the distal RCA and positioned proximal to the PDA takeoff and extended to the mid RCA. Marland Kitchen A second 3.0x38 mm Promus Premier DES stent was then inserted with stent overlap at the mid segment extending proximally. There was now 76 mm of stent in the RCA. A 3.5x20 mm Whitmer Quantum balloon was used for post stent dilatation throughout the entire stented region. However, there still was significant calcium at the mid site and there was still a residual waist despite high-pressure dilatation. A 3.75x8 mm noncompliant Quantum was then inserted for focal high-pressure dilatation up to 19 atmospheres at this site. This did improve the residual waist but again there was still some mild residual bowing at the site but there was excellent stent apposition. The procedure was a long procedure but ultimately successful. At the completion of the procedure, the temporary pacemaker which had been utilized throughout a majority of the procedure was removed the patient was bradycardic with pulse in the 50s and was chest pain-free with stable hemodynamics.   HEMODYNAMICS:  Central Aorta: 115/50  ANGIOGRAPHY:  At the start of the procedure, there was severe calcification of a diffusely diseased right coronary artery. It was a 60% proximal stenosis. There was dense calcification in the mid vessel with 90% stenosis and in the region of the acute margin stenosis was 99%. Initially, the distal RCA was small caliber and supplied several small branches. Following extensive high-speed rotational atherectomy, Cutting Balloon, low-level balloon dilatation at all sites had undergone rotablation, and ultimate tandem stenting with 3.0x38 mm DES stents postdilated to 3.5 mm with the focal mid segment being dilated up to 19 atmospheres with a 3.75x8 mm noncompliant balloon, there was marked improvement in vessel size with most sites being reduced to 0%. There was still mild residual waist the mid level which was severely calcified but there did appear to be excellent stent apposition. This site was postdilated to 3.89 mm. At the completion of the procedure, the distal right coronary artery was a moderate size vessel and gave rise to significantly improved flow down the PDA PLA and inferior LV branches.  IMPRESSION:  Difficult but successful complex percutaneous coronary intervention of a severely calcified diffusely diseased right coronary artery with temporary pacemaker insertion, High-Speed Rotational Atherectomy, Cutting Balloon atherotomy, PTCA, and tandem stenting with two 3.0x38 mm DES stents with all sites being postdilated to 3.5 mm but with a residual mid RCA site with significant residual extrinsic calcium being postdilated to 3.89 mm.  Lennette Bihari, MD, [facility]  07/19/2013  7:12 PM     CARDIAC CATHETERIZATION    PROCEDURE:  Selective coronary angiography, bypass angiography.  INDICATIONS:  Non-STEMI  The risks, benefits, and details of the procedure were explained to the patient.  The patient verbalized understanding and wanted to proceed.  Informed  written consent was obtained.  PROCEDURE TECHNIQUE:  After Xylocaine anesthesia a 46F sheath was placed in the right femoral artery with a single anterior needle wall stick.   Left coronary angiography was done using a Judkins L4 guide catheter.  Right coronary angiography was done using a Judkins R4 guide catheter.  SVG angiography was done with the JR 4 catheter. The LIMA catheter was used to engage the left internal mammary graft. Manual compression will be used for  hemostasis.    CONTRAST:  Total of 105 cc. COMPLICATIONS:  None.   HEMODYNAMICS:  Aortic pressure was 140/51    ANGIOGRAPHIC DATA:   The left main coronary artery is a large vessel which is widely patent..  The left anterior descending artery is a large vessel which wraps around the apex. There several diagonal vessels proximally which are medium size and moderately diseased. There is a larger diagonal off the mid LAD which is patent. After this large diagonal, there is a focal 70% mid LAD stenosis. There is some mild competitive flow noted in distal LAD.  The LIMA to LAD is patent..  The left circumflex artery is a large vessel proximally. There is an OM 1 which is occluded in the midportion at the distal edge of an apparent stent. There are minimal collaterals to this distribution.  The SVG to circumflex system is occluded.  The SVG to RCA system is occluded.  The right coronary artery is a large dominant vessel. There are multiple stents throughout the vessel. The posterior lateral artery is medium-sized and patent. The posterior descending artery is medium size with moderate disease. In the mid vessel, there is a portion of the stented segment which appears under dilated. In looking back at the old report, this area was post dilated with a 3.75 balloon to high pressure. Due to calcification, the stent did not expand any more. There is TIMI 3 flow. In the cranial view, the unemployment does not appear to be as  significant.  LEFT VENTRICULOGRAM:  Left ventricular angiogram was not done due to the known severe aortic stenosis.  IMPRESSIONS:    1. Widely patent left main coronary artery. 2. 70% mid left anterior descending artery stenosis with patent LIMA to LAD. 3. Large left circumflex artery with occluded large OM 1. SVG to OM is occluded. 4. Patent stents in the heavily calcified right coronary artery.  Variable expansion of the previously placed stents due to heavy calcification.  Occluded SVG to RCA. 5. Left ventricular systolic function not assessed.  RECOMMENDATION:  Continue medical therapy. No significant change in coronary anatomy from November 2014, post RCA intervention. I suspect her non-STEMI was due to ischemia in the circumflex territory in the setting of atrial flutter with rapid ventricular response and severe aortic stenosis. She has many cardiac and other medical issues. There are no straightforward percutaneous options for revascularization. Hopefully, maintaining sinus rhythm will help avoid further ischemia.    Pulmonary Function Tests       FVC  0.80 L  (34% predicted)  FEV1  0.64 L  (35% predicted)  FEF25-75 0.51 L  (30% predicted)   TLC  Not done RV  Not done  DLCO  Not done    STS Risk Calculator  Procedure    AVR + redo CABG  Risk of Mortality   55.8% Morbidity or Mortality  81.8% Prolonged LOS   69.8% Short LOS    1.0% Permanent Stroke   7.2% Prolonged Vent Support  82.8% DSW Infection    2.4% Renal Failure    n/a% Reoperation    30.3%   Impression:  I have personally reviewed the patient's 3 most recent cardiac catheterizations and her most recent transthoracic echocardiogram. The patient has at least moderate if not severe aortic stenosis with severe three-vessel coronary artery disease and vein graft disease status post coronary artery bypass grafting in 2011. She has been hospitalized a total of 4 times since early November and 3 times over  the  past month with recurrent PAF, non-STEMI, acute NYHA class IV exacerbations of chronic CHF, and most recently with a PEA arrest that occurred during hemodialysis.  Vein grafts to both the left circumflex and the right coronary system are chronically occluded. The large obtuse marginal branch of the native left circumflex system is chronically occluded and there are minimal collaterals to this region. The patient has diffusely diseased right coronary artery with patent overlapping drug-eluting stents placed in November 2014 in the mid right coronary artery. Although the stents remained patent at the time of her catheterization last month, the stents clearly remain under expanded due to severe calcification in the native vessels and I would surmise that the patient remains at considerable risk for acute ischemic event in the not too distant future, if the stents remain patent at this time.  Left ventricular systolic function was reasonably well-preserved at the time of her last echocardiogram, although the patient has severe diastolic dysfunction, severe pulmonary hypertension and chronic right-sided heart failure.  The patient clearly has aortic stenosis with severe calcification and restricted leaflet mobility. However, one of the 3 leaflets moves considerably better than the others. Peak velocity across the aortic valve ranged between 3.3 and 4.2 m/s. The patient also has relatively small sized left ventricular outflow tract.  In addition, there is significant right ventricular dysfunction with at least moderate tricuspid regurgitation.  Pulmonary function tests demonstrated the presence of severe COPD with baseline FEV1 only 0.64 L per minute. Finally, the patient has a severely calcified, essentially porcelain ascending thoracic aorta.   In summary, I feel that this patient should not be considered a candidate for conventional surgical aortic valve replacement with or without redo coronary artery bypass  grafting under any circumstances. Transcatheter aortic valve replacement could be considered for management of aortic stenosis, although I am skeptical TAVR would substantially improve her functional status or long-term outcome.  Palliative care may be the most appropriate option.    Plan:  We will attempt to meet again with the patient when her family can be present to discuss alternatives, and we will review the case at length with colleagues. Overall I think it would likely be best for this patient be treated with palliative care only. If transcatheter aortic valve replacement were to be considered the patient would need repeat transthoracic echocardiogram and repeat catheterization to reassess the patency of her right coronary stents since her most recent acute event.  She would require CT angiography to establish vascular access alternatives and the anatomic feasibility of transcatheter aortic valve replacement.  She would also require a formal physical therapy evaluation, nutritional consult, and dental service consult.  However, at this point I would refrain from ordering any further diagnostic tests until a definitive plan is been established.   I spent in excess of 110 minutes during the conduct of this hospital consultation and >50% of this time involved direct face-to-face encounter for counseling and/or coordination of the patient's care.   Salvatore Decent. Cornelius Moras, MD 11/03/2013 4:03 PM

## 2013-11-04 LAB — GLUCOSE, CAPILLARY
GLUCOSE-CAPILLARY: 182 mg/dL — AB (ref 70–99)
GLUCOSE-CAPILLARY: 206 mg/dL — AB (ref 70–99)
Glucose-Capillary: 216 mg/dL — ABNORMAL HIGH (ref 70–99)
Glucose-Capillary: 156 mg/dL — ABNORMAL HIGH (ref 70–99)

## 2013-11-04 NOTE — Progress Notes (Signed)
Subjective: Pt had no acute events overnight. Pt reports less diarrhea and maybe beginnings of soft formed stools. Pt ambulated yesterday. Extensive education/counseling in terms of conclusions and findings of Multi-disciplinary valve team was had with the patient. Pt is unsure of decision at this time of whether to pursue palliative or not and will speak this decision over with her family.   Objective: Vital signs in last 24 hours: Filed Vitals:   11/03/13 1300 11/03/13 1713 11/03/13 2144 11/04/13 0555  BP: 122/51 116/47 123/40 138/46  Pulse: 76 61 62 67  Temp: 98.4 F (36.9 C) 98.2 F (36.8 C) 98 F (36.7 C) 97.9 F (36.6 C)  TempSrc: Oral Oral Oral Oral  Resp: 18 18 16 18   Height:      Weight:      SpO2: 99% 98% 98% 100%   Weight change:   Intake/Output Summary (Last 24 hours) at 11/04/13 0932 Last data filed at 11/03/13 1700  Gross per 24 hour  Intake    360 ml  Output      0 ml  Net    360 ml   Physical Exam  Constitutional: She is oriented to person, place, and time. She appears well-developed and well-nourished. No distress.  Cardiovascular: Intact distal pulses.   Murmur heard. Pulmonary/Chest: Effort normal and breath sounds normal. No respiratory distress. She has no wheezes. She has no rales.  Abdominal: Soft. Bowel sounds are normal. She exhibits no distension. There is no tenderness. There is no rebound and no guarding.  Neurological: She is alert and oriented to person, place, and time.  Skin: She is not diaphoretic.  Psychiatric: She has a normal mood and affect. Her behavior is normal.    Lab Results: Basic Metabolic Panel:  Recent Labs Lab 10/29/13 1635 10/30/13 0645  11/01/13 0230 11/02/13 0921  NA  --  142  < > 138 132*  K  --  3.6*  < > 3.8 4.0  CL  --  99  < > 97 94*  CO2  --  26  < > 28 24  GLUCOSE  --  90  < > 190* 159*  BUN  --  33*  < > 17 26*  CREATININE  --  4.64*  < > 2.83* 4.38*  CALCIUM  --  9.3  < > 8.7 9.0  MG 2.0  --   --    --   --   PHOS  --  5.5*  --   --  3.7  < > = values in this interval not displayed.  CBC:  Recent Labs Lab 10/31/13 0845 11/01/13 0230 11/02/13 1000  WBC 11.8* 8.0 5.1  NEUTROABS 9.2* 5.6  --   HGB 9.1* 8.5* 9.3*  HCT 26.8* 25.7* 28.7*  MCV 90.5 92.8 92.9  PLT 307 232 281   CBG:  Recent Labs Lab 11/02/13 2135 11/03/13 0758 11/03/13 1145 11/03/13 1647 11/03/13 2138 11/04/13 0713  GLUCAP 281* 259* 192* 162* 250* 182*   Medications: I have reviewed the patient's current medications. Scheduled Meds: . amiodarone  200 mg Oral Daily  . aspirin  81 mg Oral Daily  . atorvastatin  40 mg Oral q1800  . clopidogrel  75 mg Oral Q breakfast  . [START ON 11/07/2013] darbepoetin (ARANESP) injection - DIALYSIS  60 mcg Intravenous Q Wed-HD  . doxercalciferol  2 mcg Intravenous Q M,W,F-HD  . heparin  5,000 Units Subcutaneous 3 times per day  . insulin aspart  0-9 Units Subcutaneous TID  WC  . sodium chloride  3 mL Intravenous Q12H  . vancomycin  125 mg Oral QID   Continuous Infusions:  PRN Meds:.acetaminophen, acetaminophen, loratadine Assessment/Plan: Principal Problem:   Clostridium difficile colitis Active Problems:   End stage renal disease   DM (diabetes mellitus)   HTN (hypertension)   HLD (hyperlipidemia)   COPD (chronic obstructive pulmonary disease)   Aortic stenosis, moderate at cath 07/11/13   PAF (paroxysmal atrial fibrillation)   CAD- CABG X 3 09/2009, HSRA/DES to native RCA 07/11/13   Smoker   Atrial fibrillation   Chronic right-sided heart failure   Cardiac arrest   Sinus bradycardia   Aortic stenosis  # C diff colitis- improving - Pt continues to have decreasing stool output and more formed consistency. Ambulating and VS stabilizing.  - continue po vancomycin 125mg  4 times daily  - Up to chair today. - PT OT eval for likely SNF placement  # Severe Aortic Stenosis - Echo 10/06/13 showing EF 60-65%, but aortic valve area 0.45. 24 hr blood pressures ranging  107/38 - 121/41  -TAVR eval underway by cardiology team, PFTs completed and awaiting patient decision before pursing further workup to prepare for procedure. -monitor volume status  - maintain MAP >60 with 250 cc fluid boluses.  # PEA Cardiac Event resolved - cardiac arrest reported by HD center. Event appears more consistent with hypotension-induced syncope during dialysis in the setting of severe AS and likely volume depletion from C difficile diarrhea. EP evaluation concluded that primary bradycardia or tachy-Tassin syndrome was unlikely, and that patient is not a candidate for EP procedure/pacer at this time.  - tele -cardiology consulted for management of severe afterload dependent AS in setting of ESRD  -hold metoprolol  -continue amiodarone 200mg  daily  -continue to monitor patient's blood pressure, and maintain afterload with cautious IVF boluses as needed   # ESRD - on MWF HD. - HD per nephrology  # Paroxysmal Afib - recently discharged 2/28 after an admission for afib with RVR, discharged on amiodarone taper and metoprolol. Currently in sinus bradycardia; 24 hr HR ranging 49-53.  -hold metoprolol, continued amiodarone at decreased dose as above  -no indication for pacemaker per EP note  -pt high risk for anticoagulation  # CAD - s/p CABG x3 in 2011 (LIMA to LAD, vein to circumflex, vein to PDA), s/p cath Nov 2014 (DES to RCA).  -continue aspirin, plavix, statin   # DM2 - On home Novolog 70/30 on a sliding scale, pt unclear on dosing. Per dietician, pt has not been taking insulin at home or checking blood glucose. Patient's A1c is 8.5. Patient has received 1 - 4 units of insulin total per day in hospital. -continue SSI while inpatient  - will need outpatient f/u regarding DM  Dispo: Disposition is deferred at this time, awaiting improvement of current medical problems.  Anticipated discharge in approximately 1-3 day(s).   The patient does have a current PCP (Imran Clarnce Flock, MD)  and does not need an [facility] hospital follow-up appointment after discharge.  The patient does have transportation limitations that hinder transportation to clinic appointments.  .Services Needed at time of discharge: Y = Yes, Blank = No PT:   OT:   RN:   Equipment:   Other:     LOS: 6 days   Christen Bame, MD 11/04/2013, 9:32 AM Pgr: [phone]

## 2013-11-04 NOTE — Progress Notes (Signed)
Subjective:  No current complaints, had regular BM without diarrhea she seems back to baseline  Objective: Vital signs in last 24 hours: Temp:  [97.9 F (36.6 C)-98.4 F (36.9 C)] 97.9 F (36.6 C) (03/08 0555) Pulse Rate:  [61-76] 67 (03/08 0555) Resp:  [16-18] 18 (03/08 0555) BP: (116-138)/(40-51) 138/46 mmHg (03/08 0555) SpO2:  [98 %-100 %] 100 % (03/08 0555) Weight change:   Intake/Output from previous day: 03/07 0701 - 03/08 0700 In: 600 [P.O.:600] Out: -    Lab Results:  Recent Labs  11/02/13 1000  WBC 5.1  HGB 9.3*  HCT 28.7*  PLT 281   BMET:  Recent Labs  11/02/13 0921  NA 132*  K 4.0  CL 94*  CO2 24  GLUCOSE 159*  BUN 26*  CREATININE 4.38*  CALCIUM 9.0  ALBUMIN 2.6*   No results found for this basename: PTH,  in the last 72 hours Iron Studies: No results found for this basename: IRON, TIBC, TRANSFERRIN, FERRITIN,  in the last 72 hours  EXAM:  General appearance: Alert, in no apparent distress  Resp: CTA without rales, rhonchi, or wheezes  Cardio: RRR with Gr III/VI systolic murmur, no rub  GI: + BS, soft and nontender  Extremities: No edema  Access: AVF @ LUA with + bruit   Dialysis Orders: Dialysis: MWF [place]  3.5h F160 52kg 3K/2.25 Bath L AVF Prof 4 Heparin none  Hectorol 2 Epo 2200 Hgb 10.5 2/18 sats 24% sat and ferritin 1600 in January; iPTH 433 1/28   Assessment/Plan: 1. Cardiac arrest - during HD 3/2, s/p CPR for 10 mins; Hx CAD s/p CABG 2011 & PCI 06/2013, A-fib, and severe AS, seen by Cardiology; high risk for surgical AVR, possible TAVR, but will not likely improve long-term outcome (per Dr. Cornelius Moraswen). Has had 2 uneventful HD treatments here 2. C diff colitis - resolving on PO Vancomycin.  3. Hematochezia - intermittent for 3 mos, likely sec to hemorrhoids vs diverticulosis, Hgb better.  4. ESRD - HD on MWF via AVF @ [facility], K 4, time increased to 4 hrs for more gentle fluid removal. Next HD tomorrow. 5. HTN/Volume - BP 138/46 stable, wt  51.4 kg s/p HD 3/6, time increased to 4 hrs to tolerate fluid removal.  6. Anemia - Hgb 9.3, Aranesp increased to 60 mcg on Wed.  7. Sec HPT - Ca 9 (10.1 corrected), P 3.7; Hectorol 2 mcg, no binder.  8. Nutrition - Alb 2.6, renal diet & vitamin.  9. A-fib - on Amiodarone, Plavix.  10. DM - insulin per primary.  11. COPD    LOS: 6 days   [name],[name] 11/04/2013,7:32 AM  Patient seen and examined, agree with above note with above modifications. Seen, looks good and back to baseline- medically ready for discharge.  I guess there are some issues regarding disposition.  Plan for HD tomorrow no heparin  Annie SableKellie Eon Zunker, MD 11/04/2013

## 2013-11-05 DIAGNOSIS — I471 Supraventricular tachycardia, unspecified: Secondary | ICD-10-CM

## 2013-11-05 LAB — MRSA PCR SCREENING: MRSA BY PCR: NEGATIVE

## 2013-11-05 LAB — RENAL FUNCTION PANEL
Albumin: 2.4 g/dL — ABNORMAL LOW (ref 3.5–5.2)
BUN: 46 mg/dL — ABNORMAL HIGH (ref 6–23)
CO2: 24 mEq/L (ref 19–32)
Calcium: 8.9 mg/dL (ref 8.4–10.5)
Chloride: 87 mEq/L — ABNORMAL LOW (ref 96–112)
Creatinine, Ser: 5.13 mg/dL — ABNORMAL HIGH (ref 0.50–1.10)
GFR, EST AFRICAN AMERICAN: 9 mL/min — AB (ref >90)
GFR, EST NON AFRICAN AMERICAN: 8 mL/min — AB (ref >90)
GLUCOSE: 192 mg/dL — AB (ref 70–99)
PHOSPHORUS: 4 mg/dL (ref 2.3–4.6)
Potassium: 4 mEq/L (ref 3.7–5.3)
SODIUM: 126 meq/L — AB (ref 137–147)

## 2013-11-05 LAB — CBC
HCT: 23 % — ABNORMAL LOW (ref 36.0–46.0)
Hemoglobin: 7.9 g/dL — ABNORMAL LOW (ref 12.0–15.0)
MCH: 30.7 pg (ref 26.0–34.0)
MCHC: 34.3 g/dL (ref 30.0–36.0)
MCV: 89.5 fL (ref 78.0–100.0)
PLATELETS: 345 10*3/uL (ref 150–400)
RBC: 2.57 MIL/uL — ABNORMAL LOW (ref 3.87–5.11)
RDW: 15.4 % (ref 11.5–15.5)
WBC: 7.7 10*3/uL (ref 4.0–10.5)

## 2013-11-05 LAB — BASIC METABOLIC PANEL
BUN: 12 mg/dL (ref 6–23)
CALCIUM: 5.2 mg/dL — AB (ref 8.4–10.5)
CO2: 18 mEq/L — ABNORMAL LOW (ref 19–32)
Chloride: 112 mEq/L (ref 96–112)
Creatinine, Ser: 1.67 mg/dL — ABNORMAL HIGH (ref 0.50–1.10)
GFR calc Af Amer: 35 mL/min — ABNORMAL LOW (ref >90)
GFR calc non Af Amer: 30 mL/min — ABNORMAL LOW (ref >90)
Glucose, Bld: 87 mg/dL (ref 70–99)
Potassium: <2.2 mEq/L — CL (ref 3.7–5.3)
SODIUM: 141 meq/L (ref 137–147)

## 2013-11-05 LAB — GLUCOSE, CAPILLARY
GLUCOSE-CAPILLARY: 150 mg/dL — AB (ref 70–99)
Glucose-Capillary: 167 mg/dL — ABNORMAL HIGH (ref 70–99)
Glucose-Capillary: 153 mg/dL — ABNORMAL HIGH (ref 70–99)

## 2013-11-05 LAB — MAGNESIUM: Magnesium: 1.1 mg/dL — ABNORMAL LOW (ref 1.5–2.5)

## 2013-11-05 MED ORDER — LIDOCAINE HCL (PF) 1 % IJ SOLN: 5.0000 mL | INTRAMUSCULAR | Status: DC | PRN

## 2013-11-05 MED ORDER — VANCOMYCIN 50 MG/ML ORAL SOLUTION
125.0000 mg | Freq: Four times a day (QID) | ORAL | Status: DC
  Administered 2013-11-05 – 2013-11-12 (×26): 125 mg via ORAL
  Filled 2013-11-05 (×29): qty 2.5

## 2013-11-05 MED ORDER — ACETAMINOPHEN 325 MG PO TABS
ORAL_TABLET | ORAL | Status: AC
  Administered 2013-11-06: 650 mg
  Filled 2013-11-05: qty 2

## 2013-11-05 MED ORDER — ALTEPLASE 2 MG IJ SOLR
2.0000 mg | Freq: Once | INTRAMUSCULAR | Status: DC | PRN
  Filled 2013-11-05: qty 2

## 2013-11-05 MED ORDER — HEPARIN SODIUM (PORCINE) 1000 UNIT/ML DIALYSIS: 1000.0000 [IU] | INTRAMUSCULAR | Status: DC | PRN

## 2013-11-05 MED ORDER — CALCIUM GLUCONATE 10 % IV SOLN
1.0000 g | Freq: Once | INTRAVENOUS | Status: AC
  Administered 2013-11-05: 1 g via INTRAVENOUS
  Filled 2013-11-05: qty 10

## 2013-11-05 MED ORDER — SODIUM CHLORIDE 0.9 % IV SOLN: 100.0000 mL | INTRAVENOUS | Status: DC | PRN

## 2013-11-05 MED ORDER — PENTAFLUOROPROP-TETRAFLUOROETH EX AERO: 1.0000 "application " | INHALATION_SPRAY | CUTANEOUS | Status: DC | PRN

## 2013-11-05 MED ORDER — DOXERCALCIFEROL 4 MCG/2ML IV SOLN
INTRAVENOUS | Status: AC
  Administered 2013-11-05: 2 ug
  Filled 2013-11-05: qty 2

## 2013-11-05 MED ORDER — SODIUM CHLORIDE 0.9 % IV BOLUS (SEPSIS)
250.0000 mL | Freq: Once | INTRAVENOUS | Status: AC
  Administered 2013-11-05: 250 mL via INTRAVENOUS

## 2013-11-05 MED ORDER — LIDOCAINE-PRILOCAINE 2.5-2.5 % EX CREA: 1.0000 "application " | TOPICAL_CREAM | CUTANEOUS | Status: DC | PRN

## 2013-11-05 MED ORDER — BOOST / RESOURCE BREEZE PO LIQD
1.0000 | Freq: Three times a day (TID) | ORAL | Status: DC
  Administered 2013-11-05 – 2013-11-10 (×6): 1 via ORAL

## 2013-11-05 MED ORDER — METOPROLOL TARTRATE 1 MG/ML IV SOLN
2.5000 mg | INTRAVENOUS | Status: AC
  Administered 2013-11-05: 2.5 mg via INTRAVENOUS
  Filled 2013-11-05: qty 5

## 2013-11-05 MED ORDER — NEPRO/CARBSTEADY PO LIQD: 237.0000 mL | ORAL | Status: DC | PRN

## 2013-11-05 MED ORDER — POTASSIUM CHLORIDE CRYS ER 20 MEQ PO TBCR
40.0000 meq | EXTENDED_RELEASE_TABLET | Freq: Once | ORAL | Status: AC
  Administered 2013-11-05: 40 meq via ORAL
  Filled 2013-11-05: qty 2

## 2013-11-05 MED ORDER — PHENYLEPHRINE HCL 10 MG/ML IJ SOLN
30.0000 ug/min | INTRAVENOUS | Status: DC
  Administered 2013-11-05: 40 ug/min via INTRAVENOUS
  Filled 2013-11-05: qty 1

## 2013-11-05 NOTE — Progress Notes (Signed)
Heart Valve Team Note:  I met with the patient and her family this afternoon (son/brother). I have discussed her case extensively with Dr Roxy Manns and Dr Angelena Form. Please see their notes for full details. As documented, she has aortic stenosis which is one of her many medical problems. Other significant problems include diffuse calcific 3 vessel CAD, CKD on hemodialysis x at least 7 years, atrial fib, pulmonary HTN, and severe COPD. She has had a very tenuous clinical course.   It is notable that her peak-to-peak transaortic gradient at cardiac cath just 4 months ago was 32 mm Hg and mean gradient by recent echo is 43 mm Hg.   While her aortic stenosis is clearly in the moderate/severe range, her medical comorbidities place her at extreme risk of any valvular intervention, including TAVR. Her predicted mortality risk with surgical AVR is over 50%, which predicts a poor outcome with any intervention. She continues to be at high risk of coronary events and also has had hemodynamic instability with recurrent atrial fibrillation.   I think the most appropriate course of action is palliative medical therapy. Dr Roxy Manns, Dr Angelena Form, and I have reviewed her case and we are all in agreement that TAVR would not likely yield significant improvement in her overall condition or clinical course. This was discussed at length with the patient and her family who understand and agree with conservative therapy.  Sherren Mocha 11/05/2013 11:25 PM

## 2013-11-05 NOTE — Progress Notes (Signed)
Report was called on pt from hemodialysis RN Suzette BattiestVeronica and stated that pt heart rate was sustaining in the 140's and Dr. Hyman HopesWebb stated to stop treatment and send pt back to the unit. I went to assess the pt in hemodialysis and pt was complaining of chest pain and her heart rate was sustaining between 140-142. Rapid response was called and stated to call the MD.  MD was paged and cardiology was called and made aware of the heart rate and symptoms. An ECG was done on the pt prior to MD coming up to assess the pt. Orders were receive to give pt a dose of Metoprolol 2.5mg  x1. Med was given and pt BP dropped to 66/39. Pt was given a 250cc bolus x1 and orders were given to transfer pt to a stepdown unit.

## 2013-11-05 NOTE — Progress Notes (Signed)
Subjective:  No cos , just back from walking hall with rolling walker with PT/ HD today Objective Vital signs in last 24 hours: Filed Vitals:   11/04/13 1800 11/04/13 2145 11/05/13 0620 11/05/13 0821  BP: 121/51 142/42 147/57 150/58  Pulse: 72 69 71 72  Temp: 98 F (36.7 C) 98.4 F (36.9 C) 98.6 F (37 C) 98.6 F (37 C)  TempSrc: Oral Oral Oral Oral  Resp: 18 18 18 18   Height:      Weight:  55.157 kg (121 lb 9.6 oz)    SpO2: 100% 91% 93% 93%   Weight change:   Labs: Basic Metabolic Panel:  Recent Labs Lab 10/30/13 0645 10/31/13 0845 11/01/13 0230 11/02/13 0921  NA 142 138 138 132*  K 3.6* 3.7 3.8 4.0  CL 99 96 97 94*  CO2 26 20 28 24   GLUCOSE 90 85 190* 159*  BUN 33* 47* 17 26*  CREATININE 4.64* 5.43* 2.83* 4.38*  CALCIUM 9.3 9.1 8.7 9.0  PHOS 5.5*  --   --  3.7    Recent Labs Lab 10/30/13 0645 11/02/13 0921  ALBUMIN 2.8* 2.6*   CBC:  Recent Labs Lab 10/30/13 0645 10/31/13 0845 11/01/13 0230 11/02/13 1000  WBC 12.8* 11.8* 8.0 5.1  NEUTROABS  --  9.2* 5.6  --   HGB 9.0* 9.1* 8.5* 9.3*  HCT 27.1* 26.8* 25.7* 28.7*  MCV 93.8 90.5 92.8 92.9  PLT 184 307 232 281   Cardiac Enzymes:  Recent Labs Lab 10/29/13 1635 10/29/13 2145  TROPONINI <0.30 <0.30   CBG:  Recent Labs Lab 11/04/13 0713 11/04/13 1145 11/04/13 1702 11/04/13 2148 11/05/13 0817  GLUCAP 182* 216* 156* 206* 167*   Medications:   . amiodarone  200 mg Oral Daily  . aspirin  81 mg Oral Daily  . atorvastatin  40 mg Oral q1800  . clopidogrel  75 mg Oral Q breakfast  . [START ON 11/07/2013] darbepoetin (ARANESP) injection - DIALYSIS  60 mcg Intravenous Q Wed-HD  . doxercalciferol  2 mcg Intravenous Q M,W,F-HD  . heparin  5,000 Units Subcutaneous 3 times per day  . insulin aspart  0-9 Units Subcutaneous TID WC  . sodium chloride  3 mL Intravenous Q12H  . vancomycin  125 mg Oral QID    Physical Exam: General: NAD resting in Bed Heart: RRR with 3/6 sem  No rub Lungs: L  basilar wheeze otherwise CTA  Abdomen: BS pos. , soft , nt, nd Extremities: Dialysis Access:  Trace bipedal edema/ pos. Bruit L UA AVF with scabbing area on mild aneurysm avf area   Dialysis Orders: Dialysis: MWF [place]  3.5h F160 52kg 3K/2.25 Bath L AVF Prof 4 Heparin none  Hectorol 2 Epo 2200 Hgb 10.5 2/18 sats 24% sat and ferritin 1600 in January; iPTH 433 1/28   Assessment/Plan:  1. Cardiac arrest - during HD 3/2, s/p CPR for 10 mins; Hx CAD s/p CABG 2011 & PCI 06/2013, A-fib, and severe AS, seen by Cardiology; high risk for surgical AVR, possible TAVR, but will not likely improve long-term outcome (per Dr. Cornelius Moras).HD X 2 in hosp without prob and hd today 2. C diff colitis - resolving on PO Vancomycin. No diarrhea today per pt 3. Hematochezia - intermittent for 3 mos, likely sec to hemorrhoids vs diverticulosis, Hgb better.9.3on 9/03 4. ESRD - HD on MWF via AVF @ [facility], K 4, time increased to 4 hrs for more gentle fluid removal.HD today 5. HTN/Volume - BP 150/58 stable, wt 55.1  Kg yesterday( edw 52kg), time increased to 4 hrs to tolerate fluid removal with Cardiac issues.  6. Anemia - Hgb 9.3, Aranesp increased to 60 mcg on Wed.  7. Sec HPT - Ca 9 (10.1 corrected), P 3.7; Hectorol 2 mcg, no binder.  8. Nutrition - Alb 2.6, renal diet & vitamin.  Add Breeze protein supplement 9. A-fib - on Amiodarone, Plavix.  10. DM - insulin per primary.  11. COPD   Lenny Pastelavid Zeyfang, PA-C [facility] Beeper [phone] 11/05/2013,10:13 AM  LOS: 7 days   Pt seen, examined and agree w A/P as above.  Vinson Moselleob Tove Wideman MD pager [phone]    cell [phone] 11/05/2013, 1:58 PM

## 2013-11-05 NOTE — Progress Notes (Signed)
Pt gone to dialysis via bed.   

## 2013-11-05 NOTE — Progress Notes (Signed)
Subjective: [name].  Meeting with TAVR team, pt, and family planned for this morning.  Pt reports diarrhea has resolved.  No new chest pain, lightheadedness, SOB, abdominal, or rectal pain.  Pt feels ready for discharge.  She was too fatigued to see PT after dialysis on Friday; has not yet been evaluated by PT.   Objective: Vital signs in last 24 hours: Temp:  [98 F (36.7 C)-98.6 F (37 C)] 98.6 F (37 C) (03/09 0821) Pulse Rate:  [68-72] 72 (03/09 0821) Resp:  [18] 18 (03/09 0821) BP: (107-150)/(42-58) 150/58 mmHg (03/09 0821) SpO2:  [91 %-100 %] 93 % (03/09 0821) Weight:  [55.157 kg (121 lb 9.6 oz)] 55.157 kg (121 lb 9.6 oz) (03/08 2145) Weight change:  03/08 0701 - 03/09 0700 In: 720 [P.O.:720] Out: -   Intake/Output Summary (Last 24 hours) at 11/05/13 0932 Last data filed at 11/04/13 1700  Gross per 24 hour  Intake    480 ml  Output      0 ml  Net    480 ml    Physical Exam General: Resting comfortably in no acute distress. Eyes: EOMI.  Conjunctivae normal, sclerae clear and anicteric. HENT: Oropharyngeal mucous membranes pink and moist. Pulm: Normal respiratory rate and effort.  Breath sounds faint, limited effort on exam, but CTAB, no wheezes or crackles. Cardio: RRR, S1 and S2 with harsh, holosystolic murmur, unchanged. Abdomen: Soft, non-tender, non-distended. Extremities: Warm and well perfused, with trace edema, and poorly healing lesions on lower extremities, unchanged. Neuro: Alert, oriented, and appropriate.  CN II-XII grossly intact.  Lab Results: Basic Metabolic Panel: CBG:  Recent Labs Lab 11/03/13 2138 11/04/13 0713 11/04/13 1145 11/04/13 1702 11/04/13 2148 11/05/13 0817  GLUCAP 250* 182* 216* 156* 206* 167*    Micro Results: No new results  Studies/Results: No new studies  Medications: I have reviewed the patient's current medications. Scheduled Meds: . amiodarone  200 mg Oral Daily  . aspirin  81 mg Oral Daily  . atorvastatin  40 mg  Oral q1800  . clopidogrel  75 mg Oral Q breakfast  . [START ON 11/07/2013] darbepoetin (ARANESP) injection - DIALYSIS  60 mcg Intravenous Q Wed-HD  . doxercalciferol  2 mcg Intravenous Q M,W,F-HD  . heparin  5,000 Units Subcutaneous 3 times per day  . insulin aspart  0-9 Units Subcutaneous TID WC  . sodium chloride  3 mL Intravenous Q12H  . vancomycin  125 mg Oral QID   Continuous Infusions:  PRN Meds:.acetaminophen, acetaminophen, loratadine Assessment/Plan: Ms. [name] is a 73 year old dialysis patient with multiple cardiac conditions including CAD, aortic stenosis, and PAF who presented on 10/29/13 after receiving 10 minutes of CPR with ROSC after an apparent cardiac arrest during her dialysis session.  # Cardiac Event - putative PEA arrest vs. bradycardic arrest in the setting of severe AS, dialysis, and possibly volume depletion from C difficile diarrhea.  AS likely a significant contributing factor; patient not a candidate for open valve replacement.  Pt and family met with cardiology today; opted not to proceed with TAVR given high mortality risk from procedure and unclear clinical benefit. -on tele  -PFTs consistent with COPD -hold metoprolol -continue amiodarone 257m daily  -continue to monitor patient's blood pressure, and maintain preload with cautious IVF boluses as needed   # Severe Aortic Stenosis - Will not proceed with TAVR as above.  # [facility]  Tolerating inpatient HD well since cardiac event 1 week  ago. -scheduled for dialysis today  # C diff colitis - Resolved; Vancomycin day 5.  - continue po vancomycin 169m 4 times daily for 10 - 14 days - consider hemorrhoid ointment if patient experiencing rectal pain   # Bright red blood per rectum - Intermittent blood and rectal pain with defecation for the last 3 months per pt report. Colonoscopy with Dr. MLyda Jesterin 2009 showed external hemorroids, removed hyperplastic polyps with  recommended 10 year follow up. This most likely represents hemorrhoidal bleeding (multiple hemorrhoids present on DRE) in the setting of increased stool frequency, vs diverticulosis. Hb currently stable; increased 8.5 -> 9.3.  -no need for GI consult at this time   # Paroxysmal Afib - Patient has been in sinus bradycardiac or NSR this admission. -hold metoprolol, continued amiodarone at 2065mdaily as above  -not a candidate for anticoagulation or EP procedures  # QT prolongation - seen on prior EKG's, though mildly higher on admission compared to prior (QTc = 549 from 504).  -continue telemetry  -patient on anti-arrhythmic therapy, not candidate for EP procedure   # CAD - s/p CABG x3 in 2011 (LIMA to LAD, vein to circumflex, vein to PDA), s/p cath Nov 2014 (DES to RCA).  -continue aspirin, plavix, statin   # DM2 - On home Novolog 70/30 on a sliding scale, pt unclear on dosing.  Per dietician, pt has not been taking insulin at home or checking blood glucose.  Patient's A1c is 8.5. Patient has received 9-10 units of insulin total per day over the weekend, with CBGs ranging 156-216 -continue sensitive SSI  -consider prescription for glucometer and strips on discharge  -patient will need close outpatient follow up to ensure good glycemic control   Dispo:  PT recommendation on 3/5 that patient be discharged with home health PT with 24 hr supervision/assistance.   This is a [employer] information officerote.  The care of the patient was discussed with Dr. KoMargart Sicklesnd the assessment and plan formulated with their assistance.  Please see their attached note for official documentation of the daily encounter.   LOS: 7 days   Cassie LabellaMed Student 11/05/2013, 9:32 AM

## 2013-11-05 NOTE — Progress Notes (Signed)
Subjective:   NAE ON. Diarrhea has resolved. Patient requesting to go home.   Objective: Vital signs in last 24 hours: Filed Vitals:   11/04/13 1400 11/04/13 1800 11/04/13 2145 11/05/13 0620  BP: 111/48 121/51 142/42 147/57  Pulse: 70 72 69 71  Temp: 98 F (36.7 C) 98 F (36.7 C) 98.4 F (36.9 C) 98.6 F (37 C)  TempSrc: Oral Oral Oral Oral  Resp: 18 18 18 18   Height:      Weight:   121 lb 9.6 oz (55.157 kg)   SpO2: 100% 100% 91% 93%   Weight change:   Intake/Output Summary (Last 24 hours) at 11/05/13 0742 Last data filed at 11/04/13 1700  Gross per 24 hour  Intake    720 ml  Output      0 ml  Net    720 ml   Physical Exam  Constitutional: She is oriented to person, place, and time. She appears well-developed and well-nourished. No distress.  Cardiovascular: Intact distal pulses.   Murmur heard. Pulmonary/Chest: Effort normal and breath sounds normal. No respiratory distress. She has no wheezes. She has no rales.  Abdominal: Soft. Bowel sounds are normal. She exhibits no distension. There is no tenderness. There is no rebound and no guarding.  Neurological: She is alert and oriented to person, place, and time.  Skin: She is not diaphoretic.  Psychiatric: She has a normal mood and affect. Her behavior is normal.    Lab Results: Basic Metabolic Panel:  Recent Labs Lab 10/29/13 1635 10/30/13 0645  11/01/13 0230 11/02/13 0921  NA  --  142  < > 138 132*  K  --  3.6*  < > 3.8 4.0  CL  --  99  < > 97 94*  CO2  --  26  < > 28 24  GLUCOSE  --  90  < > 190* 159*  BUN  --  33*  < > 17 26*  CREATININE  --  4.64*  < > 2.83* 4.38*  CALCIUM  --  9.3  < > 8.7 9.0  MG 2.0  --   --   --   --   PHOS  --  5.5*  --   --  3.7  < > = values in this interval not displayed.  CBC:  Recent Labs Lab 10/31/13 0845 11/01/13 0230 11/02/13 1000  WBC 11.8* 8.0 5.1  NEUTROABS 9.2* 5.6  --   HGB 9.1* 8.5* 9.3*  HCT 26.8* 25.7* 28.7*  MCV 90.5 92.8 92.9  PLT 307 232 281    CBG:  Recent Labs Lab 11/03/13 1647 11/03/13 2138 11/04/13 0713 11/04/13 1145 11/04/13 1702 11/04/13 2148  GLUCAP 162* 250* 182* 216* 156* 206*   Medications: I have reviewed the patient's current medications. Scheduled Meds: . amiodarone  200 mg Oral Daily  . aspirin  81 mg Oral Daily  . atorvastatin  40 mg Oral q1800  . clopidogrel  75 mg Oral Q breakfast  . [START ON 11/07/2013] darbepoetin (ARANESP) injection - DIALYSIS  60 mcg Intravenous Q Wed-HD  . doxercalciferol  2 mcg Intravenous Q M,W,F-HD  . heparin  5,000 Units Subcutaneous 3 times per day  . insulin aspart  0-9 Units Subcutaneous TID WC  . sodium chloride  3 mL Intravenous Q12H  . vancomycin  125 mg Oral QID   Continuous Infusions:  PRN Meds:.acetaminophen, acetaminophen, loratadine Assessment/Plan: Principal Problem:   Clostridium difficile colitis Active Problems:   End stage renal disease  DM (diabetes mellitus)   HTN (hypertension)   HLD (hyperlipidemia)   COPD (chronic obstructive pulmonary disease)   Aortic stenosis, moderate at cath 07/11/13   PAF (paroxysmal atrial fibrillation)   CAD- CABG X 3 09/2009, HSRA/DES to native RCA 07/11/13   Smoker   Atrial fibrillation   Chronic right-sided heart failure   Cardiac arrest   Sinus bradycardia   Aortic stenosis  # C diff colitis- improving - Resolving. - continue po vancomycin 125mg  4 times daily  - PT OT eval for likely SNF placement  # Severe Aortic Stenosis   -TAVR eval by cardiology team. It appears that the TAVR does not believe that the patient would benefit from open AS repair with or without redo CABG. Further, the note states that TAVR is unlikely to provide significant improved QOL or length of life. The patient will discuss this with her family this morning at 10 am. - Apprecaite cards input. - monitor volume status  - maintain MAP >60 with 250 cc fluid boluses.  # PEA Cardiac Event resolved - cardiac arrest reported by HD center.  Event appears more consistent with hypotension-induced syncope during dialysis in the setting of severe AS and likely volume depletion from C difficile colitis. EP evaluation concluded that primary bradycardia or tachy-Abramovich syndrome was unlikely, and that patient is not a candidate for EP procedure/pacer at this time. See above for discussion about AS treatment. - tele -hold metoprolol  -continue amiodarone 200mg  daily  -continue to monitor patient's blood pressure, and maintain afterload with cautious IVF boluses as needed   # ESRD - on MWF HD. - HD per nephrology  # Paroxysmal Afib - recently discharged 2/28 after an admission for afib with RVR, discharged on amiodarone and metoprolol. Currently in normal sinus. -hold metoprolol, continued amiodarone at decreased dose as above  -no indication for pacemaker per EP note  -pt high risk for anticoagulation  # CAD - s/p CABG x3 in 2011 (LIMA to LAD, vein to circumflex, vein to PDA), s/p cath Nov 2014 (DES to RCA).  -continue aspirin, plavix, statin   # DM2 - On home Novolog 70/30 on a sliding scale, pt unclear on dosing. Per dietician, pt has not been taking insulin at home or checking blood glucose. Patient's A1c is 8.5. Patient has received 1 - 4 units of insulin total per day in hospital. -continue SSI while inpatient  - will need outpatient f/u regarding DM  Dispo: Disposition is deferred at this time, awaiting improvement of current medical problems.  Anticipated discharge in approximately 1-3 day(s).   The patient does have a current PCP (Imran Clarnce Flock, MD) and does not need an [facility] hospital follow-up appointment after discharge.  The patient does have transportation limitations that hinder transportation to clinic appointments.  .Services Needed at time of discharge: Y = Yes, Blank = No PT:   OT:   RN:   Equipment:   Other:     LOS: 7 days   Pleas Koch, MD 11/05/2013, 7:42 AM Pgr: [phone]

## 2013-11-05 NOTE — Progress Notes (Signed)
Physical Therapy Treatment Patient Details Name: [name] MRN: [id number] DOB: [birth year] Today's Date: 11/05/2013 Time: 0947-1010 PT Time Calculation (min): 23 min  PT Assessment / Plan / Recommendation  History of Present Illness Patient is a 73 year old woman with history of coronary artery disease status post CABG and PCI, recently hospitalized on the cardiology service with chest pain and rapid atrial fibrillation and started on amiodarone, moderate to severe aortic stenosis, end-stage renal disease on hemodialysis, atrial fibrillation, and other problems as outlined in the medical history, who reportedly became unresponsive and pulseless during hemodialysis today.   PT Comments   Continue to feel patient is safe for d/c home with family assist for mobility.  Does not need extensive amount of assist, and states family there 24/7.  Feel HHPT on non-dialysis days will aide in regaining strength.  Continued to encourage walker use at all times due to h/o falls.  Follow Up Recommendations  Home health PT;Supervision/Assistance - 24 hour           Equipment Recommendations  None recommended by PT    Recommendations for Other Services  None  Frequency Min 3X/week   Progress towards PT Goals Progress towards PT goals: Progressing toward goals  Plan Current plan remains appropriate    Precautions / Restrictions Precautions Precautions: Fall Precaution Comments: reports multiple recent falls at home    Pertinent Vitals/Pain Min c/o soreness in abdomen from shots    Mobility  Bed Mobility Overal bed mobility: Needs Assistance Bed Mobility: Sit to Supine Sit to supine: Supervision General bed mobility comments: cues for positioning in bed and for safety getting legs into bed Transfers Overall transfer level: Needs assistance Equipment used: Rolling walker (2 wheeled) Transfers: Sit to/from Stand Sit to Stand: Min assist General transfer comment: assist to stand from bed,  minguard for safe descent to bed Ambulation/Gait Ambulation/Gait assistance: Supervision;Min guard Ambulation Distance (Feet): 160 Feet Assistive device: Rolling walker (2 wheeled) Gait Pattern/deviations: Step-through pattern;Decreased stride length General Gait Details: maneuvers walker around multiple obstacles in hallway without cues, minguard for safety      PT Goals (current goals can now be found in the care plan section)    Visit Information  Last PT Received On: 11/05/13 Assistance Needed: +1 History of Present Illness: Patient is a 73 year old woman with history of coronary artery disease status post CABG and PCI, recently hospitalized on the cardiology service with chest pain and rapid atrial fibrillation and started on amiodarone, moderate to severe aortic stenosis, end-stage renal disease on hemodialysis, atrial fibrillation, and other problems as outlined in the medical history, who reportedly became unresponsive and pulseless during hemodialysis today.    Subjective Data   Sat up all day Saturday   Cognition  Cognition Arousal/Alertness: Awake/alert Behavior During Therapy: [facility] Hospital[facility]FL for tasks assessed/performed Overall Cognitive Status: Within Functional Limits for tasks assessed    Balance  Balance Overall balance assessment: History of Falls;Needs assistance Sitting-balance support: Feet unsupported;No upper extremity supported Sitting balance-Leahy Scale: Good Sitting balance - Comments: sat edge of bed to eat breakfast Standing balance support: Bilateral upper extremity supported Standing balance-Leahy Scale: Poor Standing balance comment: needs UE support for balance in standing; assist for perineal hygiene standing wtih UE support  End of Session PT - End of Session Equipment Utilized During Treatment: Gait belt Activity Tolerance: Patient tolerated treatment well Patient left: in bed;with call bell/phone within reach;with bed alarm set;with family/visitor  present   GP     [facility] Surgery Center LLCWYNN,CYNDI 11/05/2013, 10:12 AM Cyndi  [place], [place] [phone] 11/05/2013

## 2013-11-05 NOTE — Evaluation (Signed)
Occupational Therapy Evaluation Patient Details Name: Hadassah Paiseggy A Lazard MRN: [id number] DOB: February 28, 1941 Today's Date: 11/05/2013 Time: [id number] OT Time Calculation (min): 29 min  OT Assessment / Plan / Recommendation History of present illness Patient is a 73 year old woman with history of coronary artery disease status post CABG and PCI, recently hospitalized on the cardiology service with chest pain and rapid atrial fibrillation and started on amiodarone, moderate to severe aortic stenosis, end-stage renal disease on hemodialysis, atrial fibrillation, and other problems as outlined in the medical history, who reportedly became unresponsive and pulseless during hemodialysis today.   Clinical Impression   Pt demos decline in function with ADLs and ADL mobility safety and would benefit from acute OT services to address impairments to increase level of function and safety    OT Assessment  Patient needs continued OT Services    Follow Up Recommendations  Home health OT;Supervision/Assistance - 24 hour    Barriers to Discharge  none    Equipment Recommendations  None recommended by OT    Recommendations for Other Services    Frequency  Min 2X/week    Precautions / Restrictions Precautions Precautions: Fall Precaution Comments: reports multiple recent falls at home  Restrictions Weight Bearing Restrictions: No   Pertinent Vitals/Pain No c/o pain    ADL  Grooming: Performed;Wash/dry hands;Wash/dry face;Min guard Where Assessed - Grooming: Supported standing Upper Body Bathing: Simulated;Supervision/safety;Set up Where Assessed - Upper Body Bathing: Unsupported sitting Lower Body Bathing: Simulated;Minimal assistance Upper Body Dressing: Performed;Supervision/safety;Set up;Minimal assistance Where Assessed - Upper Body Dressing: Unsupported sitting;Supported standing Lower Body Dressing: Performed;Minimal assistance Toilet Transfer: Minimal assistance;Simulated Toilet Transfer  Method: Sit to stand Toileting - Clothing Manipulation and Hygiene: Minimal assistance;Moderate assistance Where Assessed - Toileting Clothing Manipulation and Hygiene: Standing Tub/Shower Transfer Method: Not assessed Equipment Used: Gait belt;Rolling walker Transfers/Ambulation Related to ADLs: pt declined to ambulate to bathroom although therapist requested twice that she do so. Pt had BM while seated EOB and was able to [facility] for pericare/hygiene. Cues for control of descent to chair ADL Comments: bed linens required changing due to BM    OT Diagnosis: Generalized weakness  OT Problem List: Decreased strength;Impaired balance (sitting and/or standing);Decreased activity tolerance OT Treatment Interventions: Self-care/ADL training;Therapeutic exercise;Patient/family education;Neuromuscular education;Balance training;Therapeutic activities;DME and/or AE instruction   OT Goals(Current goals can be found in the care plan section) Acute Rehab OT Goals Patient Stated Goal: to go home OT Goal Formulation: With patient Time For Goal Achievement: 11/12/13 Potential to Achieve Goals: Good ADL Goals Pt Will Perform Grooming: with min guard assist;with supervision;with set-up;standing Pt Will Perform Lower Body Bathing: with min guard assist;with supervision;with set-up Pt Will Perform Lower Body Dressing: with min guard assist;with supervision;with set-up Pt Will Transfer to Toilet: with min guard assist;with supervision;regular height toilet;ambulating;grab bars Pt Will Perform Toileting - Clothing Manipulation and hygiene: with min assist;with min guard assist;sit to/from stand Pt Will Perform Tub/Shower Transfer: with min guard assist;shower seat  Visit Information  Last OT Received On: 11/05/13 Assistance Needed: +1 History of Present Illness: Patient is a 73 year old woman with history of coronary artery disease status post CABG and PCI, recently hospitalized on the cardiology  service with chest pain and rapid atrial fibrillation and started on amiodarone, moderate to severe aortic stenosis, end-stage renal disease on hemodialysis, atrial fibrillation, and other problems as outlined in the medical history, who reportedly became unresponsive and pulseless during hemodialysis today.       Prior Functioning     Home Living  Family/patient expects to be discharged to:: Private residence Living Arrangements: Children Available Help at Discharge: Friend(s);Available 24 hours/day Type of Home: House Home Access: Stairs to enter [employer] of Steps: 5-7 Entrance Stairs-Rails: Right;Left Home Layout: One level Home Equipment: Walker - 4 wheels;Bedside commode;Shower seat;Cane - single point Prior Function Level of Independence: Needs assistance Gait / Transfers Assistance Needed: reports she always has assist going up/down steps; inside home is modified independent ADL's / Homemaking Assistance Needed: pt reports she has been needing help with bathing; unable to stand long periods of time; family helps with cooking and cleaning  Comments: ambulates with cane or RW  Communication Communication: No difficulties Dominant Hand: Right         Vision/Perception Vision - History Baseline Vision: Wears glasses only for reading Patient Visual Report: No change from baseline Perception Perception: Within Functional Limits   Cognition  Cognition Arousal/Alertness: Awake/alert Behavior During Therapy: WFL for tasks assessed/performed Overall Cognitive Status: Within Functional Limits for tasks assessed    Extremity/Trunk Assessment Upper Extremity Assessment Upper Extremity Assessment: Overall WFL for tasks assessed;Generalized weakness Lower Extremity Assessment Lower Extremity Assessment: Defer to PT evaluation Cervical / Trunk Assessment Cervical / Trunk Assessment: Kyphotic     Mobility Bed Mobility Overal bed mobility: Needs Assistance Bed  Mobility: Sit to Supine Sit to supine: Supervision General bed mobility comments: pt sitting EOB Transfers Overall transfer level: Needs assistance Equipment used: Rolling walker (2 wheeled) Transfers: Sit to/from Stand Sit to Stand: Min assist General transfer comment: assist to stand from bed, min guard A for safe descent to chair          Balance Balance Overall balance assessment: History of Falls;Needs assistance Sitting-balance support: No upper extremity supported;Feet supported Sitting balance-Leahy Scale: Good Sitting balance - Comments: sat edge of bed to eat breakfast Standing balance support: Single extremity supported;Bilateral upper extremity supported;During functional activity Standing balance-Leahy Scale: Poor Standing balance comment: needs UE support for balance in standing; assist for perineal hygiene standing wtih UE support   End of Session OT - End of Session Equipment Utilized During Treatment: Rolling walker;Gait belt Activity Tolerance: Patient tolerated treatment well Patient left: in chair;with call bell/phone within reach;with family/visitor present;Other (comment) (pt requeisting to stay up in recliner. Pt's nurse inmformed there was no chiar alarm)  GO     Galen Manila 11/05/2013, 1:42 PM

## 2013-11-05 NOTE — Progress Notes (Signed)
Report called to Kevin FentonPaula Trivette, RN.

## 2013-11-05 NOTE — Progress Notes (Signed)
CTSP secondary to rapid HR.  While in HD developed tachycardia associated with nausea and lightheadedness and chest pain.  EKG showed narrow complex tachycardia and it was initially felt to be sinus tachycardia.  She was given 2.5mg  Lopressor by the Internal Medicine Resident which slowed her HR to 130's but her SBP dropped to 60mmHg.  Her mental status has remained intact throughout this.  She was given NS bolus of 500cc and transferred to the CCU.  Currently she appears in SVT with HR 128bpm with HR 60/3030mmHg. BP too low to try IV Amio.  Will start IV phenylephrine gtt and try to get BP stabilized.  Will then try slow IV Amio load since this worked in the past.  Would like to try to avoid cardioversion since she is mentating fine and would have to have anesthesia which would be risky with her severe AS.

## 2013-11-05 NOTE — Progress Notes (Addendum)
Brief Cardiology X-Cover Note  Dr. Mayford Knifeurner provided face-to-face sign-out at the bedside with Ms. [name], who is a complicated patient with significant CAD, development of moderate to Severe AS (peak velocity as high as 4.2 m/s), poor surgical candidate, ESRD on hemodialysis who developed rapid atrial fibrillation in dialysis. She received 2.5 mg IV lopressor and had sbp to 50s. With some fluid she increased to 70s/50s. However, throughout she's been fairly asymptomatic. On exam, alert, oriented to person/place (short exam), neck supple, heart irregularly irregular, systolic murmur present, no lower extremity edema, warm extremities. Plan to initiate phenylephrine to avoid adrenergic drive from inotropes/pressors. If she remains in rapid atrial fibrillation, will consider low dose amiodarone IV either slow bolus or 0.5 mg/kg/min. Continue oral amiodarone otherwise. Will monitor symptoms/vitals. She also is in process of transferring from stepdown to CCU (to 2h11).   [name], MD  Potassium noted to be < 2.2, and Ca 5 so repletion for both and lab recheck.   [name], MD

## 2013-11-05 NOTE — Progress Notes (Signed)
Internal Medicine Attending  Date: 11/05/2013  Patient name: [name] Medical record number: [id number] Date of birth: 25-Mar-1941 Age: 73 y.o. Gender: female  I saw and evaluated the patient. I reviewed the resident's note by Dr. Glendell DockerKomanski and I agree with the resident's findings and plans as documented in his note, with the following additional comments.  Patient became tachycardic during dialysis, and dialysis was stopped early.  Plans include transfer to step down unit for close monitoring and careful rate control.

## 2013-11-05 NOTE — Progress Notes (Signed)
Pt in dialysis hr 140's systollic. Sustained,  Moved pt back to room paged hspt, rapid response.  Pt co chest pain "a little" , nauseated.  EKG done at bedside.  Dr. In room.  V/o metoprolol 2.5 mg  IV given.  Bolus 250 NS given x 1. Pt to be transferred to step down 2H 23 via bed.

## 2013-11-05 NOTE — Progress Notes (Signed)
Dr. Daphine DeutscherMartin Web aware of pt's heart rate sustained 140"s; pt denies chest pain states she can feel that her heart is racing. Dr. Hyman HopesWebb ordered to stop tx; pt has been seen by cardiology.

## 2013-11-05 NOTE — Progress Notes (Signed)
Brief Interval Progress Note  While on HD this evening, the patient developed tachycardia.  When I evaluated the patient, she noted symptoms of nausea and lightheadedness, as well as previously some chest pain while on HD.  EKG revealed a narrow-complex tachycardia, which appeared regular, possibly representing sinus tachycardia, though due to the elevated rate it was difficult to distinguish from afib/aflutter.  I ordered metoprolol, 2.5 mg IV, which slowed her HR to the low 130's, but decreased her BP from SBP 100's to SBP 60's.  The patient's mental status remained intact, though symptoms of nausea increased.  NS boluses were started at 250 cc each, and after the first bolus BP had improved slightly.  A second bolus was started, and the patient was transferred to stepdown.    Will continue to monitor, and continue boluses while monitoring BP.  Cardiology PA present after fluid boluses started, and informed Dr. Ladona Ridgelaylor, who will see the patient.  SignedJanalyn Harder, Karena Kinker, PGY3 11/05/2013, 7:42 PM

## 2013-11-05 NOTE — Progress Notes (Signed)
Inpatient Diabetes Program Recommendations  AACE/ADA: New Consensus Statement on Inpatient Glycemic Control (2013)  Target Ranges:  Prepandial:   less than 140 mg/dL      Peak postprandial:   less than 180 mg/dL (1-2 hours)      Critically ill patients:  140 - 180 mg/dL     Results for [name], [name] (MRN [id number]) as of 11/05/2013 09:32  Ref. Range 11/04/2013 07:13 11/04/2013 11:45 11/04/2013 17:02 11/04/2013 21:48  Glucose-Capillary Latest Range: 70-99 mg/dL 956182 (H) 213216 (H) 086156 (H) 206 (H)      **Per patient, patient hasn't taken her 70/30 insulin in over 2 weeks. Was supposed to be taking 70/30 insulin- 10 units with breakfast and 15 units with supper   **Having some glucose elevations. Eating 85-100%.    **MD- Please consider restarting 1/2 of patient's home 70/30 insulin- 5 units with breakfast and 8 units with supper    Will follow. Ambrose FinlandJeannine Johnston Ellis Koffler RN, MSN, CDE Diabetes Coordinator Inpatient Diabetes Program Team Pager: [phone] (8a-10p)

## 2013-11-05 NOTE — Care Management Note (Signed)
   CARE MANAGEMENT NOTE 11/05/2013  Patient:  [name], [name]   Account Number:  [id number]  Date Initiated:  10/31/2013  Documentation initiated by:  Marvetta Gibbons  Subjective/Objective Assessment:   Pt admitted s/p cardiac arrest during HD     Action/Plan:   PTA pt lived with friends- PT eval ordered  11/05/13 Met with pt re d/c plans, per PT plan to d/c to home however pt asking for short term rehab at [facility], CSW notified.   Anticipated DC Date:  11/07/2013   Anticipated DC Plan:  [place]  CM consult      Choice offered to / List presented to:             Status of service:  In process, will continue to follow Medicare Important Message given?   (If response is "NO", the following Medicare IM given date fields will be blank) Date Medicare IM given:   Date Additional Medicare IM given:    Discharge Disposition:    Per UR Regulation:  Reviewed for med. necessity/level of care/duration of stay  If discussed at [place] of Stay Meetings, dates discussed:    Comments:  11/05/13 Per PT notes pt wishes to go home with [facility] and family support. This CM met with pt who now states that she wishes to go to short term SNF for rehab to increase strength. This was clarified with the pt , who also states that she has been [facility] and perfers that facility . CSW, V [place] notified. Jasmine Pang RN MPH, case manager, [phone]

## 2013-11-06 ENCOUNTER — Ambulatory Visit: Payer: Medicare Other | Admitting: Cardiology

## 2013-11-06 DIAGNOSIS — I959 Hypotension, unspecified: Secondary | ICD-10-CM

## 2013-11-06 LAB — BASIC METABOLIC PANEL
BUN: 21 mg/dL (ref 6–23)
BUN: 20 mg/dL (ref 6–23)
CALCIUM: 9 mg/dL (ref 8.4–10.5)
CO2: 24 meq/L (ref 19–32)
CO2: 26 mEq/L (ref 19–32)
Calcium: 10.1 mg/dL (ref 8.4–10.5)
Chloride: 93 mEq/L — ABNORMAL LOW (ref 96–112)
Chloride: 94 mEq/L — ABNORMAL LOW (ref 96–112)
Creatinine, Ser: 3.06 mg/dL — ABNORMAL HIGH (ref 0.50–1.10)
Creatinine, Ser: 3.01 mg/dL — ABNORMAL HIGH (ref 0.50–1.10)
GFR calc Af Amer: 17 mL/min — ABNORMAL LOW (ref >90)
GFR calc Af Amer: 17 mL/min — ABNORMAL LOW (ref >90)
GFR calc non Af Amer: 14 mL/min — ABNORMAL LOW (ref >90)
GFR calc non Af Amer: 15 mL/min — ABNORMAL LOW (ref >90)
GLUCOSE: 137 mg/dL — AB (ref 70–99)
Glucose, Bld: 138 mg/dL — ABNORMAL HIGH (ref 70–99)
Potassium: 4.1 mEq/L (ref 3.7–5.3)
Potassium: 4 mEq/L (ref 3.7–5.3)
SODIUM: 132 meq/L — AB (ref 137–147)
Sodium: 135 mEq/L — ABNORMAL LOW (ref 137–147)

## 2013-11-06 LAB — GLUCOSE, CAPILLARY
GLUCOSE-CAPILLARY: 126 mg/dL — AB (ref 70–99)
GLUCOSE-CAPILLARY: 149 mg/dL — AB (ref 70–99)
Glucose-Capillary: 138 mg/dL — ABNORMAL HIGH (ref 70–99)
Glucose-Capillary: 152 mg/dL — ABNORMAL HIGH (ref 70–99)

## 2013-11-06 LAB — MAGNESIUM
MAGNESIUM: 1.8 mg/dL (ref 1.5–2.5)
Magnesium: 1.8 mg/dL (ref 1.5–2.5)

## 2013-11-06 LAB — TROPONIN I
TROPONIN I: 0.73 ng/mL — AB (ref <0.30)
TROPONIN I: 0.69 ng/mL — AB (ref <0.30)
Troponin I: 1.05 ng/mL (ref <0.30)

## 2013-11-06 MED ORDER — CLOPIDOGREL BISULFATE 75 MG PO TABS
75.0000 mg | ORAL_TABLET | Freq: Every day | ORAL | Status: DC
  Administered 2013-11-06 – 2013-11-12 (×7): 75 mg via ORAL
  Filled 2013-11-06 (×7): qty 1

## 2013-11-06 MED ORDER — MAGNESIUM SULFATE 40 MG/ML IJ SOLN: 2.0000 g | Freq: Once | INTRAMUSCULAR | Status: DC

## 2013-11-06 MED ORDER — WHITE PETROLATUM GEL
Status: AC
  Administered 2013-11-06: 1
  Filled 2013-11-06: qty 5

## 2013-11-06 NOTE — Progress Notes (Signed)
Internal Medicine Attending  Date: 11/06/2013  Patient name: [name] Medical record number: [id number] Date of birth: 27-May-1941 Age: 73 y.o. Gender: female  I saw and evaluated the patient, and discussed her care on A.M rounds with housestaff.  I reviewed the resident's note by Dr. Glendell DockerKomanski and I agree with the resident's findings and plans as documented in his note.

## 2013-11-06 NOTE — Progress Notes (Signed)
Nutrition Brief Note  Patient identified on the Malnutrition Screening Tool (MST) Report  Wt Readings from Last 15 Encounters:  11/06/13 123 lb 0.3 oz (55.8 kg)  11/06/13 123 lb 0.3 oz (55.8 kg)  10/27/13 115 lb 3.2 oz (52.254 kg)  10/09/13 115 lb 1.3 oz (52.2 kg)  10/09/13 115 lb 1.3 oz (52.2 kg)  09/11/13 123 lb 12.8 oz (56.155 kg)  07/19/13 119 lb 6.4 oz (54.159 kg)  07/13/13 113 lb 1.5 oz (51.3 kg)  07/13/13 113 lb 1.5 oz (51.3 kg)  07/13/13 113 lb 1.5 oz (51.3 kg)  06/19/13 121 lb 4.8 oz (55.021 kg)  03/13/13 115 lb 14.4 oz (52.572 kg)  02/13/13 118 lb (53.524 kg)  01/11/12 120 lb 8 oz (54.658 kg)    Body mass index is 21.11 kg/(m^2). Patient meets criteria for WNL based on current BMI.   Current diet order is Renal, patient is consuming approximately 100% of meals at this time. Labs and medications reviewed.   Pt admitted to hospital after becoming unresponsive and pulseless during HD. RD met with pt who states she is eating well. Pt is from facility where her meals are prepared for her- she states she is eating well at home.  Pt reports she weighed ~200 lbs at one point in her life, however has been "on the skinny side" for years.  RD performed nutrition-focused physical exam with no wasting identified.   Nutrition Focused Physical Exam: Subcutaneous Fat:  Orbital Region: WNL Upper Arm Region: WNL Thoracic and Lumbar Region: WWL  Muscle:  Temple Region: WNL Clavicle Bone Region: WNL Clavicle and Acromion Bone Region: WNL Scapular Bone Region: WNL Dorsal Hand: WNL Patellar Region: WNL Anterior Thigh Region: WNL Posterior Calf Region: WNL  Edema: none present  No nutrition interventions warranted at this time. If nutrition issues arise, please consult RD.   Cassie Matthews, MS RD LDN Clinical Inpatient Dietitian Pager: [phone] Weekend/After hours pager: [phone]  

## 2013-11-06 NOTE — Progress Notes (Signed)
Subjective:  The patient developed tachycardia during HD yesterday. She received IV metoprolol and tachycardia decreased from 140 to 130, but SBP dropped from 100's to 60's. 250 cc normal boluses were employed for BP support. The patient's BP improved but she remained hypotensive. Given her severe AS and subsequent need for maintained afterload, cardiology was consulted and started the patient on phenylephrine drip.   After this, there appeared to be spurious lab results (K of less than 2.2, Cr decreased by 50%, and increase in Na and Cl. Repeat BMP demonstrated stable electrolytes.   Objective: Vital signs in last 24 hours: Filed Vitals:   11/06/13 0329 11/06/13 0400 11/06/13 0500 11/06/13 0600  BP: 100/24 104/34 114/29 115/33  Pulse: 62 60 65 58  Temp: 97.9 F (36.6 C)     TempSrc: Oral     Resp: 20 18 24 21   Height:      Weight:   123 lb 0.3 oz (55.8 kg)   SpO2: 100% 100% 94% 95%   Weight change: 8.6 oz (0.243 kg)  Intake/Output Summary (Last 24 hours) at 11/06/13 0710 Last data filed at 11/06/13 0000  Gross per 24 hour  Intake 371.38 ml  Output   1644 ml  Net -1272.62 ml   Physical Exam  Cardiovascular: Normal rate, regular rhythm and intact distal pulses.   Murmur heard. Pulmonary/Chest: Effort normal and breath sounds normal. No respiratory distress. She has no wheezes. She has no rales.  Abdominal: Soft. Bowel sounds are normal. She exhibits no distension. There is no tenderness. There is no rebound and no guarding.  Musculoskeletal: She exhibits no edema and no tenderness.  Psychiatric: She has a normal mood and affect. Her behavior is normal.    Lab Results: Basic Metabolic Panel:  Recent Labs Lab 11/02/13 0921 11/05/13 0117 11/05/13 1300 11/05/13 2000 11/06/13 0117 11/06/13 0247  NA 132*  --  126* 141 132* 135*  K 4.0  --  4.0 <2.2* 4.1 4.0  CL 94*  --  87* 112 93* 94*  CO2 24  --  24 18* 24 26  GLUCOSE 159*  --  192* 87 137* 138*  BUN 26*  --  46*  12 21 20   CREATININE 4.38*  --  5.13* 1.67* 3.06* 3.01*  CALCIUM 9.0  --  8.9 5.2* 9.0 10.1  MG  --  1.8  --  1.1*  --   --   PHOS 3.7  --  4.0  --   --   --    Liver Function Tests:  Recent Labs Lab 11/02/13 0921 11/05/13 1300  ALBUMIN 2.6* 2.4*   CBC:  Recent Labs Lab 10/31/13 0845 11/01/13 0230 11/02/13 1000 11/05/13 1300  WBC 11.8* 8.0 5.1 7.7  NEUTROABS 9.2* 5.6  --   --   HGB 9.1* 8.5* 9.3* 7.9*  HCT 26.8* 25.7* 28.7* 23.0*  MCV 90.5 92.8 92.9 89.5  PLT 307 232 281 345   CBG:  Recent Labs Lab 11/04/13 1145 11/04/13 1702 11/04/13 2148 11/05/13 0817 11/05/13 1149 11/05/13 2125  GLUCAP 216* 156* 206* 167* 150* 153*    Micro Results: Recent Results (from the past 240 hour(s))  MRSA PCR SCREENING     Status: None   Collection Time    10/29/13  1:59 PM      Result Value Ref Range Status   MRSA by PCR NEGATIVE  NEGATIVE Final   Comment:            The GeneXpert MRSA  Assay (FDA     approved for NASAL specimens     only), is one component of a     comprehensive MRSA colonization     surveillance program. It is not     intended to diagnose MRSA     infection nor to guide or     monitor treatment for     MRSA infections.  CLOSTRIDIUM DIFFICILE BY PCR     Status: Abnormal   Collection Time    10/30/13  2:55 PM      Result Value Ref Range Status   C difficile by pcr POSITIVE (*) NEGATIVE Final   Comment: CRITICAL RESULT CALLED TO, READ BACK BY AND VERIFIED WITH:     STANFIELD RN 17:20 10/30/13 ([email or web address])  MRSA PCR SCREENING     Status: None   Collection Time    11/05/13  9:16 PM      Result Value Ref Range Status   MRSA by PCR NEGATIVE  NEGATIVE Final   Comment:            The GeneXpert MRSA Assay (FDA     approved for NASAL specimens     only), is one component of a     comprehensive MRSA colonization     surveillance program. It is not     intended to diagnose MRSA     infection nor to guide or     monitor treatment for     MRSA infections.    Medications: I have reviewed the patient's current medications. Scheduled Meds: . amiodarone  200 mg Oral Daily  . aspirin  81 mg Oral Daily  . atorvastatin  40 mg Oral q1800  . clopidogrel  75 mg Oral Q breakfast  . [START ON 11/07/2013] darbepoetin (ARANESP) injection - DIALYSIS  60 mcg Intravenous Q Wed-HD  . doxercalciferol  2 mcg Intravenous Q M,W,F-HD  . feeding supplement (RESOURCE BREEZE)  1 Container Oral TID BM  . heparin  5,000 Units Subcutaneous 3 times per day  . insulin aspart  0-9 Units Subcutaneous TID WC  . sodium chloride  3 mL Intravenous Q12H  . vancomycin  125 mg Oral QID   Continuous Infusions: . phenylephrine (NEO-SYNEPHRINE) Adult infusion Stopped (11/05/13 2315)   PRN Meds:.acetaminophen, acetaminophen, loratadine Assessment/Plan: Principal Problem:   Clostridium difficile colitis Active Problems:   End stage renal disease   DM (diabetes mellitus)   HTN (hypertension)   HLD (hyperlipidemia)   COPD (chronic obstructive pulmonary disease)   Aortic stenosis, moderate at cath 07/11/13   PAF (paroxysmal atrial fibrillation)   CAD- CABG X 3 09/2009, HSRA/DES to native RCA 07/11/13   Smoker   Atrial fibrillation   Chronic right-sided heart failure   Cardiac arrest   Sinus bradycardia   Aortic stenosis  Hypotension The etiology of the patients hypotension is likely multifactorial. Key aspects include SVT and medication side effect (IV metoprolol). It remains imperative that the patients afterload be maintained as the patient has severe AS. Phenylephrine has been weaned. Ptnt has good BP. - Transfer to SDU - Appreciate rec's per cardiology - 250 cc bolus as needed to maintain MAP greater than 60  SVT The patient has a recent history of Afib with RVR. Thus this may be the etiology of the patients recent episode of SVT. Another cause of the patients SVT is increased demand during HD leading to reflex tachycardia as the patient had a recent cardiac arrest  during recent HD which was felt to be  a result of hypovolemia and hypotension. The patient did not tolerate IV metoprolol, will plan to avoid this approach in the future. However, the patients SVT may be difficult to treat given her complicated cardiac history. Please see cardiology note for further discussion. The SVT appears to have resolved at this time (HR 58 this am). - Will consider amio drip per cardiology if SVT continues or is symptomatic  Diarrhea in setting of C Diff Ag Positivity The patient had 11 episodes of copious loose BMs. I suspect that the patients episode of hypotension could have predisposed her to ischemic colitis. Another possibility is a worsening of her C Diff. I believe this may be less likely given the patients initial response to vancomycin. Patient does not report abdominal pain at this time.   - continue po vancomycin 14m 4 times daily  - will consider CT abdomen versus MRA abdomen to look for signs of ischemic colitis.   Severe Aortic Stenosis  It appears that the TAVR team does not believe that the patient would benefit from open AS repair with or without redo CABG. Further, the note states that TAVR is unlikely to provide significant improved QOL or length of life. Yesterday, Dr. CBurt Knackmet with patient and her family. The decision was made to not pursue TAVR as the expected mortality rate from TAVR is 50%.  - monitor volume status  - maintain MAP >60 with 250 cc fluid boluses.   PEA Cardiac Event resolved - cardiac arrest reported by HD center. Event appears consistent with hypotension-induced syncope during dialysis in the setting of severe AS and likely volume depletion from C difficile colitis. EP evaluation concluded that primary bradycardia or tachy-Sole syndrome was unlikely, and that patient is not a candidate for EP procedure/pacer at this time. See above for discussion about AS treatment.  - tele  -hold metoprolol  -continue amiodarone 2076mdaily    -continue to monitor patient's blood pressure, and maintain afterload with cautious IVF boluses as needed   ESRD - on MWF HD.  - HD per nephrology   Paroxysmal Afib - recently discharged 2/28 after an admission for afib with RVR, discharged on amiodarone and metoprolol. Currently in normal sinus.  -hold metoprolol, continued amiodarone at decreased dose as above  -no indication for pacemaker per EP note  -pt high risk for anticoagulation   Dispo: Disposition is deferred at this time, awaiting improvement of current medical problems.  Anticipated discharge in approximately 1-2 day(s).   The patient does have a current PCP (Imran P Gertie BaronMD) and [facility].  The patient does have transportation limitations that hinder transportation to clinic appointments.  .Services Needed at time of discharge: Y = Yes, Blank = No PT:   OT:   RN:   Equipment:   Other:     LOS: 8 days   ChMarrion CoyMD 11/06/2013, 7:10 AM

## 2013-11-06 NOTE — Progress Notes (Signed)
Night Float Interim Note  Discussed with lab and RN in regards to repeat labs after large discrepancy seen in 8pm labs tonight.  It appears the original repeat BMET that was requested around 12am was unable to be initially collected by the tech but then was collected by another tech shortly after.  However, that lab was run only as a Magnesium, which resulted back as 1.8 but documented in epic under the wrong date (11/05/13 instead of 11/06/13 at 0117).  We then discussed with the lab again, who will try to add-on a BMET to the magnesium order, however, due to the discrepancy between all labs and delay in collection and processing, a repeat morning BMET will be drawn at this time (tech present in room) as well to ensure proper lab collection and results, and this will serve as the patient's scheduled AM lab draw.   We will continue to monitor for repeat labs and replace electrolytes as needed.   Signed: Darden PalmerSamaya Sahar Ryback, MD PGY-2, Internal Medicine Resident Pager: [phone]  11/06/2013,3:01 AM

## 2013-11-06 NOTE — Progress Notes (Signed)
Physical Therapy Treatment Patient Details Name: [name] A Goto MRN: [id number] DOB: 06-03-1941 Today's Date: 11/06/2013 Time: 0840-0903 PT Time Calculation (min): 23 min  PT Assessment / Plan / Recommendation  History of Present Illness Patient is a 73 year old woman with history of coronary artery disease status post CABG and PCI, recently hospitalized on the cardiology service with chest pain and rapid atrial fibrillation and started on amiodarone, moderate to severe aortic stenosis, end-stage renal disease on hemodialysis, atrial fibrillation, and other problems as outlined in the medical history, who reportedly became unresponsive and pulseless during hemodialysis today.   PT Comments   Pt able to ambulate but only tolerating a short distance today due to feeling RLE was going numb. Pt with rectal tube removed immediately prior to ambulation and required assist for pericare. Pt encouraged to continue ambulation and HEP.   Follow Up Recommendations  Home health PT;Supervision/Assistance - 24 hour     Does the patient have the potential to tolerate intense rehabilitation     Barriers to Discharge        Equipment Recommendations       Recommendations for Other Services    Frequency     Progress towards PT Goals Progress towards PT goals: Progressing toward goals  Plan Current plan remains appropriate    Precautions / Restrictions Precautions Precautions: Fall Precaution Comments: contact  Restrictions Weight Bearing Restrictions: No   Pertinent Vitals/Pain HR 70-77 RA No pain at rest   Mobility  Bed Mobility General bed mobility comments: pt sitting EOB Transfers Overall transfer level: Needs assistance Sit to Stand: Min guard General transfer comment: cues for hand placement and increased time Ambulation/Gait Ambulation/Gait assistance: Supervision Ambulation Distance (Feet): 80 Feet Assistive device: Rolling walker (2 wheeled) Gait Pattern/deviations: Step-through  pattern;Decreased stride length Gait velocity interpretation: Below normal speed for age/gender General Gait Details: distance limited by pt stating she felt her RLE was going numb and couldn't do any more    Exercises General Exercises - Lower Extremity Long Arc Quad: AROM;Seated;Both;20 reps Hip ABduction/ADduction: AROM;Seated;Both;20 reps Straight Leg Raises: AAROM;Seated;Both;20 reps Hip Flexion/Marching: AROM;Seated;Both;20 reps Toe Raises: AROM;Seated;Both;20 reps Heel Raises: AROM;Seated;Both;20 reps   PT Diagnosis:    PT Problem List:   PT Treatment Interventions:     PT Goals (current goals can now be found in the care plan section)    Visit Information  Last PT Received On: 11/06/13 Assistance Needed: +1 History of Present Illness: Patient is a 73 year old woman with history of coronary artery disease status post CABG and PCI, recently hospitalized on the cardiology service with chest pain and rapid atrial fibrillation and started on amiodarone, moderate to severe aortic stenosis, end-stage renal disease on hemodialysis, atrial fibrillation, and other problems as outlined in the medical history, who reportedly became unresponsive and pulseless during hemodialysis today.    Subjective Data      Cognition  Cognition Arousal/Alertness: Awake/alert Behavior During Therapy: WFL for tasks assessed/performed Overall Cognitive Status: Within Functional Limits for tasks assessed    Balance     End of Session PT - End of Session Equipment Utilized During Treatment: Gait belt Activity Tolerance: Patient limited by pain Patient left: with call bell/phone within reach;in chair Nurse Communication: Mobility status   GP     Delorse Lekabor, Mava Suares Beth 11/06/2013, 10:33 AM Delaney MeigsMaija Tabor Alla Sloma, PT [phone]

## 2013-11-06 NOTE — Progress Notes (Signed)
Night Float Progress Note  Patient with narrow complex tachycardia and HR in 140s during HD this afternoon.  She received 2.5 mg Lopressor with subsequent drop in SBP to 60s.  BPs improved with fluid boluses and she was transferred to CCU where she has been placed on phenylephrine drip.  Went to check on patient tonight.  She is resting comfortably in bed and denies chest pain, dyspnea, or discomfort.  Vitals:  BP 129/38, MAP 66, HR 62, RR 19, SpO2 100% General:  Resting in bed in NAD Cardiac:  RRR, + systolic murmur Lungs:  CTA anteriorly Abdomen:  +BS, soft, NT, ND Neuro:  Alert and oriented, responding appropriately, able to move all four extremities voluntarily  Phenylephrine drip at 3815mcg/min.  73 year old woman with multiple co-morbidities including severe aortic stenosis, ESRD on HD, Afib and CAD with narrow complex tachycardia during HD this afternoon.  Her BP is stable on phenylephrine.  Evening BMP returned with several abnormalities.  K and Ca supplemented.  Awaiting repeat lab. - continue current therapy; cardiology following and recommendations appreciated - supplementing electrolytes as needed - will continue to monitor   Evelena PeatAlex Shakisha Abend DO, PGY1

## 2013-11-06 NOTE — Progress Notes (Signed)
I have read and agree with the medical student's note. See my note for additional comments.  Angelina Sheriffhris Konica Stankowski, MD

## 2013-11-06 NOTE — Progress Notes (Signed)
  [facility] Progress Note   Subjective: Up in chair, no complaints  Filed Vitals:   11/06/13 0500 11/06/13 0600 11/06/13 0700 11/06/13 0800  BP: 114/29 115/33 117/42 111/40  Pulse: 65 58 60 63  Temp:    97.9 F (36.6 C)  TempSrc:    Oral  Resp: 24 21 23 25   Height:      Weight: 55.8 kg (123 lb 0.3 oz)     SpO2: 94% 95% 95% 95%   Exam: In chair, no distress, calm No jvd Rales L base, dec'd R base with mild egophony RRR 3/6 SEM Abd soft, nt, nd No LE edema LUA AV fistula patent   Dialysis: MWF [place] 3.5h (^'d 4h here)   F160  52kg   3K/2.25 [place]   Prof 4 Heparin none Hect 2  EPO 2200    Labwork: 10.5/24%/1600    pth 433  Assessment: 1 Cardiac arrest 2 Severe AS- not good candidate for procedure per surgeons 3 CAD hx CABG w graft disease last cath 4 AFib on amio 5 ESRD on hemodialysis 6 Cdif infection 7 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 8 DM 9 COPD 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6* 2.4*    Recent Labs Lab 10/31/13 0845 11/01/13 0230 11/02/13 1000 11/05/13 1300  WBC 11.8* 8.0 5.1 7.7  NEUTROABS 9.2* 5.6  --   --   HGB 9.1* 8.5* 9.3* 7.9*  HCT 26.8* 25.7* 28.7* 23.0*  MCV 90.5 92.8 92.9 89.5  PLT 307 232 281 345   . amiodarone  200 mg Oral Daily  . aspirin  81 mg Oral Daily  . atorvastatin  40 mg Oral q1800  . clopidogrel  75 mg Oral Q breakfast  . [START ON 11/07/2013] darbepoetin (ARANESP)  injection - DIALYSIS  60 mcg Intravenous Q Wed-HD  . doxercalciferol  2 mcg Intravenous Q M,W,F-HD  . feeding supplement (RESOURCE BREEZE)  1 Container Oral TID BM  . heparin  5,000 Units Subcutaneous 3 times per day  . insulin aspart  0-9 Units Subcutaneous TID WC  . sodium chloride  3 mL Intravenous Q12H  . vancomycin  125 mg Oral QID   . phenylephrine (NEO-SYNEPHRINE) Adult infusion Stopped (11/05/13 2315)   acetaminophen, acetaminophen, loratadine

## 2013-11-06 NOTE — Progress Notes (Signed)
Night Float Progress Note  Paged by nurse around 11AM RE: pt with c/o generalized pain.  She last received Tylenol at 9:30PM.  Went to see patient and she was asleep and appeared comfortable.  She awoke easily.  She denies pain, dyspnea, chest pain.    Vitals:  BP:  110/35, HR 60s, T 97.38F, 95% on RA General:  Resting comfortably in NAD Cardiac:  RRR, + systolic murmur Lungs:  CTA B/L Abdomen:  +BS, soft, NT, ND Extremity:  LUE fistula +thrill  73 year old woman with multiple co-morbidities including severe aortic stenosis, ESRD on HD, Afib and CAD.  Resting comfortably w/o complaint.   - continue current therapy - will continue to monitor    Evelena PeatAlex Nerida Boivin DO PGY1

## 2013-11-06 NOTE — Progress Notes (Signed)
Lab attempted to draw BMET at 2340 and lab stated that patient pulled the needle out. Lab stated that they would send someone else to redraw. At 0050 I called lab to see about the redraw and they stated that they would get someone here soon. I stated that the BMET is important.

## 2013-11-06 NOTE — Progress Notes (Signed)
HEART VALVE TEAM PROGRESS NOTE   I met with [name] and spoke about her at length with her son [name]) on the telephone.  I explained that as Dr. Burt Knack pointed out previously, we all agree that TAVR would not likely improve the patient's long term outcome or prognosis to any significant degree, and risks would be very high.  Perhaps with further adjustment in her medical therapy and conduct of dialysis she might do better for a period of time.  However, the unfortunate truth is that her combination of problems comes with an extremely poor prognosis regardless of how she might be treated.  I suggested that CODE STATUS should be discussed.  I recommend palliative care team consult.  All questions answered.  Please call if I can be of further assistance.  OWEN,CLARENCE H 11/06/2013 5:07 PM  \

## 2013-11-06 NOTE — Progress Notes (Signed)
SUBJECTIVE:  Events of last PM noted.  Currently sitting up in the chair and in NSR  OBJECTIVE:   Vitals:   Filed Vitals:   11/06/13 0500 11/06/13 0600 11/06/13 0700 11/06/13 0800  BP: 114/29 115/33 117/42 111/40  Pulse: 65 58 60 63  Temp:    97.9 F (36.6 C)  TempSrc:    Oral  Resp: 24 21 23 25   Height:      Weight: 123 lb 0.3 oz (55.8 kg)     SpO2: 94% 95% 95% 95%   I&O's:   Intake/Output Summary (Last 24 hours) at 11/06/13 0855 Last data filed at 11/06/13 0000  Gross per 24 hour  Intake 371.38 ml  Output   1644 ml  Net -1272.62 ml   TELEMETRY: Reviewed telemetry pt in NSR:     PHYSICAL EXAM General: Well developed, well nourished, in no acute distress Head: Eyes PERRLA, No xanthomas.   Normal cephalic and atramatic  Lungs:   Clear bilaterally to auscultation and percussion. Heart:   HRRR S1 S2 Pulses are 2+ & equal. Abdomen: Bowel sounds are positive, abdomen soft and non-tender without masses  Extremities:   No clubbing, cyanosis or edema.  DP +1 Neuro: Alert and oriented X 3. Psych:  Good affect, responds appropriately   LABS: Basic Metabolic Panel:  Recent Labs  16/10/96 1300 11/05/13 2000 11/06/13 0117 11/06/13 0247  NA 126* 141 132* 135*  K 4.0 <2.2* 4.1 4.0  CL 87* 112 93* 94*  CO2 24 18* 24 26  GLUCOSE 192* 87 137* 138*  BUN 46* 12 21 20   CREATININE 5.13* 1.67* 3.06* 3.01*  CALCIUM 8.9 5.2* 9.0 10.1  MG  --  1.1*  --  1.8  PHOS 4.0  --   --   --    Liver Function Tests:  Recent Labs  11/05/13 1300  ALBUMIN 2.4*   No results found for this basename: LIPASE, AMYLASE,  in the last 72 hours CBC:  Recent Labs  11/05/13 1300  WBC 7.7  HGB 7.9*  HCT 23.0*  MCV 89.5  PLT 345   Cardiac Enzymes: No results found for this basename: CKTOTAL, CKMB, CKMBINDEX, TROPONINI,  in the last 72 hours BNP: No components found with this basename: POCBNP,  D-Dimer: No results found for this basename: DDIMER,  in the last 72 hours Hemoglobin  A1C: No results found for this basename: HGBA1C,  in the last 72 hours Fasting Lipid Panel: No results found for this basename: CHOL, HDL, LDLCALC, TRIG, CHOLHDL, LDLDIRECT,  in the last 72 hours Thyroid Function Tests: No results found for this basename: TSH, T4TOTAL, FREET3, T3FREE, THYROIDAB,  in the last 72 hours Anemia Panel: No results found for this basename: VITAMINB12, FOLATE, FERRITIN, TIBC, IRON, RETICCTPCT,  in the last 72 hours Coag Panel:   Lab Results  Component Value Date   INR 1.21 10/08/2013   INR 1.25 10/06/2013   INR 1.12 06/19/2013    RADIOLOGY: Dg Chest 1 View  10/27/2013   CLINICAL DATA:  Status post right thoracentesis  EXAM: CHEST - 1 VIEW  COMPARISON:  US THORACENTESIS ASP PLEURAL SPACE W/IMG GUIDE dated 10/27/2013  FINDINGS: Status post CABG. Moderate cardiac enlargement. Left lung is clear.  On the right side, there is a small, decreased right pleural effusion status post thoracentesis. There is no pneumothorax.  IMPRESSION: No pneumothorax   Electronically Signed   By: Esperanza Heir M.D.   On: 10/27/2013 12:07   Dg Chest Portable 1  View  10/29/2013   CLINICAL DATA:  Cardiopulmonary arrest. Hypotension. Shortness of breath.  EXAM: PORTABLE CHEST - 1 VIEW  COMPARISON:  10/27/2013  FINDINGS: Prior CABG. Moderately enlarged cardiopericardial silhouette with low lung volumes, small right pleural effusion, and interstitial accentuation. Low lung volumes. No pneumothorax or well-defined rib fracture.  IMPRESSION: 1. Cardiomegaly potentially with mild interstitial edema and a small right pleural effusion.   Electronically Signed   By: Herbie BaltimoreWalt  Liebkemann M.D.   On: 10/29/2013 09:55   Dg Chest Port 1 View  10/24/2013   CLINICAL DATA:  Hemodialysis today.  Followup pleural effusion.  EXAM: PORTABLE CHEST - 1 VIEW  COMPARISON:  Q 11/16/2013  FINDINGS: Moderate to large pleural effusion on the right appears slightly larger than it did on the prior study.  No pulmonary edema. No  convincing infiltrate. No left pleural effusion.  Changes from CABG surgery are stable. The cardiac silhouette is normal in size. Normal mediastinal contours.  IMPRESSION: 1. Moderate to large right pleural effusion has mildly increased from the prior study. No other change. No pulmonary edema or convincing infiltrate.   Electronically Signed   By: Amie Portlandavid  Ormond M.D.   On: 10/24/2013 19:41   Dg Chest Port 1 View  10/22/2013   CLINICAL DATA:  Chest pain  EXAM: PORTABLE CHEST - 1 VIEW  COMPARISON:  10/06/2013  FINDINGS: Cardiomegaly again noted. Status post CABG. Stable right pleural effusion with right basilar atelectasis or infiltrate. No pulmonary edema.  IMPRESSION: Cardiomegaly. Status post CABG. Stable right pleural effusion with right basilar atelectasis or infiltrate.   Electronically Signed   By: Natasha MeadLiviu  Pop M.D.   On: 10/22/2013 13:05   [place] Thoracentesis Asp Pleural Space W/img Guide  10/27/2013   CLINICAL DATA:  Shortness of breath, right-sided pleural effusion. Request therapeutic thoracentesis.  EXAM: ULTRASOUND GUIDED right THORACENTESIS  COMPARISON:  None.  FINDINGS: A total of approximately 1 L of clear, amber colored fluid was removed. A fluid sample was notsent for laboratory analysis.  IMPRESSION: Successful ultrasound guided right thoracentesis yielding 1 L of pleural fluid.  Read by: Brayton ElKevin Bruning PA-C  PROCEDURE: An ultrasound guided thoracentesis was thoroughly discussed with the patient and questions answered. The benefits, risks, alternatives and complications were also discussed. The patient understands and wishes to proceed with the procedure. Written consent was obtained.  Ultrasound was performed to localize and mark an adequate pocket of fluid in the right chest. The area was then prepped and draped in the normal sterile fashion. 1% Lidocaine was used for local anesthesia. Under ultrasound guidance a 19 gauge Yueh catheter was introduced. Thoracentesis was performed. The catheter was  removed and a dressing applied.  Complications:  None immediate   Electronically Signed   By: Simonne ComeJohn  Watts M.D.   On: 10/27/2013 11:45            PROGRESS NOTE    Subjective:     [name] is a 73 yo with hx of ESRD, CAD, AS, a-fib.   She had CABG in 2011.  She has had progressive worsening of her aortic stenosis.     She was admitted Feb 23  with AF and chest pain.  She converted with IV amio.  She had a large right pleural effusion - was tapped.    She was admitted again 10/29/13 with PEAarrest during dialysis.   AICD was attached and did not advise any shock.   We do not have any rhythm strip during that time to know  what her arrest was due to.  May have been hypovolumic in the setting of AS and dialysis, diarrhea.         Objective:       Vital Signs:                  Temp:  [97.4 F (36.3 C)-98.7 F (37.1 C)] 98 F (36.7 C) (03/07 0900) Pulse Rate:  [62-76] 72 (03/07 0900) Resp:  [16-18] 18 (03/07 0900) BP: (101-134)/(38-56) 128/49 mmHg (03/07 0900) SpO2:  [95 %-100 %] 98 % (03/07 0900) Weight:  [113 lb 5.1 oz (51.4 kg)] 113 lb 5.1 oz (51.4 kg) (03/06 1309)   Last BM Date: 11/03/13    24-hour weight change: Weight change: 0 lb (0 kg)   Weight trends: Filed Weights     11/02/13 0500  11/02/13 0851  11/02/13 1309   Weight:  117 lb 11.6 oz (53.4 kg)  117 lb 11.6 oz (53.4 kg)  113 lb 5.1 oz (51.4 kg)      Intake/Output:            03/06 0701 - 03/07 0700 In: -   Out: 2000  Total I/O In: 240 [P.O.:240] Out: -      Physical Exam: BP 128/49  Pulse 72  Temp(Src) 98 F (36.7 C) (Oral)  Resp 18  Ht 5\' 4"  (1.626 m)  Wt 113 lb 5.1 oz (51.4 kg)  BMI 19.44 kg/m2  SpO2 98%   Wt Readings from Last 3 Encounters:   11/02/13  113 lb 5.1 oz (51.4 kg)   11/02/13  113 lb 5.1 oz (51.4 kg)   10/27/13  115 lb 3.2 oz (52.254 kg)       General:  Vital signs reviewed and noted.    Head:  Normocephalic, atraumatic.   Eyes:  conjunctivae/corneas clear.  EOM's  intact.    Throat:  normal   Neck:   normal   Lungs:      bilateral rales, rhonchi   Heart:   RR, 3/6 systolic murmur   Abdomen:   Soft, non-tender, non-distended     Extremities:  No  edema   Neurologic:  A&O X3, CN II - XII are grossly intact.    Psych:  Normal       Tele:  NSR , BBB Labs: BMET: Recent Labs   11/01/13 0230  11/02/13 0921   NA  138  132*   K  3.8  4.0   CL  97  94*   CO2  28  24   GLUCOSE  190*  159*   BUN  17  26*   CREATININE  2.83*  4.38*   CALCIUM  8.7  9.0   PHOS   --   3.7      Liver function tests: Recent Labs   11/02/13 0921   ALBUMIN  2.6*    No results found for this basename: LIPASE, AMYLASE,  in the last 72 hours   CBC: Recent Labs   11/01/13 0230  11/02/13 1000   WBC  8.0  5.1   NEUTROABS  5.6   --    HGB  8.5*  9.3*   HCT  25.7*  28.7*   MCV  92.8  92.9   PLT  232  281      Cardiac Enzymes: No results found for this basename: CKTOTAL, CKMB, TROPONINI,  in the last 72 hours   Coagulation Studies: No results found for this basename: LABPROT, INR,  in the last 72 hours   Other: No components found with this basename: POCBNP,  No results found for this basename: DDIMER,  in the last 72 hours No results found for this basename: HGBA1C,  in the last 72 hours No results found for this basename: CHOL, HDL, LDLCALC, TRIG, CHOLHDL,  in the last 72 hours No results found for this basename: TSH, T4TOTAL, FREET3, T3FREE, THYROIDAB,  in the last 72 hours No results found for this basename: VITAMINB12, FOLATE, FERRITIN, TIBC, IRON, RETICCTPCT,  in the last 72 hours     Other results: EKG:  Sinus Ginyard at 57, RBBB    Medications:       Infusions:   Scheduled Medications: .  amiodarone   200 mg  Oral  Daily   .  aspirin   81 mg  Oral  Daily   .  atorvastatin   40 mg  Oral  q1800   .  clopidogrel   75 mg  Oral  Q breakfast   .  [START ON 11/07/2013] darbepoetin (ARANESP) injection - DIALYSIS   60 mcg  Intravenous   Q Wed-HD   .  doxercalciferol   2 mcg  Intravenous  Q M,W,F-HD   .  heparin   5,000 Units  Subcutaneous  3 times per day   .  insulin aspart   0-9 Units  Subcutaneous  TID WC   .  sodium chloride   3 mL  Intravenous  Q12H   .  vancomycin   125 mg  Oral  QID       Assessment/ Plan:     Principal Problem:   Clostridium difficile colitis Active Problems:   End stage renal disease   DM (diabetes mellitus)   HTN (hypertension)   Aortic stenosis, moderate at cath 07/11/13   PAF (paroxysmal atrial fibrillation)   Atrial fibrillation   Cardiac arrest   Sinus bradycardia   Aortic stenosis  1. PEA arrest:  I think this was multifactorial - volume changes with dialysis, aortic stenosis, recent diarrhea.  She did not have a shockable rhythm and regain her rhythm with CPR and IVF.    BP is currently stable 2. Aortic stenosis:  She has been seen by the TAVR team and they felt she was at too high risk for TAVR and have recommended Palliative Care consult 3. Atrial fib:  Had episode of SVT last night with profound hypotension after receiving Lopressor IV.  Currently in NSR, on amio.  Not a coumadin candidate.  Continue Amio PO.  Check Amio level 4. CAD  S/p CABG  - cont. Current meds  Disposition:   She appears to be stable from a cardiac standpoint.  Continue current meds.  Recommend Palliative Care consult. OK to transfer out to tele bed     Quintella Reichert, MD  11/06/2013  8:55 AM

## 2013-11-06 NOTE — Progress Notes (Signed)
I have read and agree with the medical students plan,  Angelina Sheriffhris Klair Leising, MD

## 2013-11-06 NOTE — Progress Notes (Signed)
Subjective: Patient had a cardiac event during HD yesterday afternoon/evening.  HR to 140s, pt lightheaded with nausea and some chest pain.  HD stopped and rapid response called.  Metoprolol 2.5 given, and systolic BP dropped to 44H.  Patient remained mentally intact but nausea increased.  Pt given 250cc IVF x2, transferred to CCU, and phenylephrine drip started.  Pt weaned off drip after BPs restored, continued on po amiodarone and spontaneously converted to NSR.  Evening lab draw with hypokalemia and hypocalcemia, likely aberrant; K and Ca repleted, and morning lab draws normal for patient.    Diarrhea recurred overnight; rectal tube inserted by nursing staff.  This am, pt denies HA, lightheadedness, CP, SOB, or abdominal pain; she endorses diarrhea.  Dr. Burt Baker saw pt and family yesterday, discussed risks and benefits of TAVR which include mortality risk of 50%, opted not to proceed and recommended palliative care consult.  Objective: Vital signs in last 24 hours: Temp:  [96.3 F (35.7 C)-98.7 F (37.1 C)] 97.9 F (36.6 C) (03/10 0800) Pulse Rate:  [29-141] 63 (03/10 0800) Resp:  [16-29] 22 (03/10 0900) BP: (60-150)/(24-135) 104/89 mmHg (03/10 0900) SpO2:  [80 %-100 %] 95 % (03/10 0800) Weight:  [53.8 kg (118 lb 9.7 oz)-55.8 kg (123 lb 0.3 oz)] 55.8 kg (123 lb 0.3 oz) (03/10 0500) Weight change: 0.243 kg (8.6 oz) 03/09 0701 - 03/10 0700 In: 371.4 [P.O.:120; I.V.:141.4; IV Piggyback:110] Out: 1644   Intake/Output Summary (Last 24 hours) at 11/06/13 1151 Last data filed at 11/06/13 0900  Gross per 24 hour  Intake 611.38 ml  Output   1644 ml  Net -1032.62 ml    Physical Exam General: On side of bed in no acute distress. Eyes: EOMI.  Conjunctivae normal, sclerae clear and anicteric. HENT: Oropharyngeal mucous membranes pink and moist. Pulm: Normal respiratory rate and effort.  CTAB, no wheezes or crackles. Cardio: RRR, S1 and S2 with harsh holosystolic murmur,  unchanged. Abdomen: Normoactive bowel sounds. Soft, non-tender, non-distended. Extremities: Warm and well perfused, with no edema or cyanosis. Neuro: Alert, oriented, and appropriate.  CN II-XII grossly intact.  Lab Results: Basic Metabolic Panel:  Recent Labs Lab 11/02/13 0921  11/05/13 1300 11/05/13 2000 11/06/13 0117 11/06/13 0247  NA 132*  --  126* 141 132* 135*  K 4.0  --  4.0 <2.2* 4.1 4.0  CL 94*  --  87* 112 93* 94*  CO2 24  --  24 18* 24 26  GLUCOSE 159*  --  192* 87 137* 138*  BUN 26*  --  46* 12 21 20   CREATININE 4.38*  --  5.13* 1.67* 3.06* 3.01*  CALCIUM 9.0  --  8.9 5.2* 9.0 10.1  MG  --   < >  --  1.1*  --  1.8  PHOS 3.7  --  4.0  --   --   --   < > = values in this interval not displayed. Liver Function Tests:  Recent Labs Lab 11/02/13 0921 11/05/13 1300  ALBUMIN 2.6* 2.4*   CBC:  Recent Labs Lab 10/31/13 0845 11/01/13 0230 11/02/13 1000 11/05/13 1300  WBC 11.8* 8.0 5.1 7.7  NEUTROABS 9.2* 5.6  --   --   HGB 9.1* 8.5* 9.3* 7.9*  HCT 26.8* 25.7* 28.7* 23.0*  MCV 90.5 92.8 92.9 89.5  PLT 307 232 281 345   Cardiac Enzymes:  Recent Labs Lab 11/06/13 1013  TROPONINI 1.05*   CBG:  Recent Labs Lab 11/04/13 1702 11/04/13 2148 11/05/13 6759  11/05/13 1149 11/05/13 2125 11/06/13 0739  GLUCAP 156* 206* 167* 150* 153* 138*   Misc. Labs:  None  Studies/Results: EKG at 18:52 read as sinus tach vs. SVT; EKG at 20:05 read as SVT  Medications: I have reviewed the patient's current medications. Scheduled Meds: . amiodarone  200 mg Oral Daily  . aspirin  81 mg Oral Daily  . atorvastatin  40 mg Oral q1800  . clopidogrel  75 mg Oral Q breakfast  . [START ON 11/07/2013] darbepoetin (ARANESP) injection - DIALYSIS  60 mcg Intravenous Q Wed-HD  . doxercalciferol  2 mcg Intravenous Q M,W,F-HD  . feeding supplement (RESOURCE BREEZE)  1 Container Oral TID BM  . heparin  5,000 Units Subcutaneous 3 times per day  . insulin aspart  0-9 Units  Subcutaneous TID WC  . sodium chloride  3 mL Intravenous Q12H  . vancomycin  125 mg Oral QID   Continuous Infusions: . phenylephrine (NEO-SYNEPHRINE) Adult infusion Stopped (11/05/13 2315)   PRN Meds:.acetaminophen, acetaminophen, loratadine Assessment/Plan: [name] is a 73 year old dialysis patient with multiple cardiac conditions including CAD, aortic stenosis, and PAF who presented on 10/29/13 after receiving 10 minutes of CPR with ROSC after an apparent cardiac arrest during her dialysis session.  She tolerated inpatient HD well on 3/4 and 3/6, but had an additional cardiac event on 3/9.  # Hypotension - Patient received lopressor 2.23m for SVTs that developed during HD yesterday afternoon, with s subsequent fall of BP with systolics in the 641D pt transferred to CCU.  Pressure normalized with 250cc IVF x2 and phenylephrine drip.  Pt weaned off drip.  Patient was nauseous during event but remained mentally intact. - return to floor today - hold off on metoprolol in future  # Narrow complex arrhythmia - developed in the setting of HD yesterday; P waves indiscernible in setting of tachycardia and pre-existing bundle blocks, EKGs read as SVT but cardiology noted irregularly irregular rhythm concerning for Afib in pt with known PAF - will hold metoprolol in future - if SVT vs Afib develops in future, consider cardioversion with IV amio - appreciate cardiology's involvement  # Diarrhea; C diff ag positive on 3/3 - Vancomycin day 5; diarrhea initially resolved after 72hr on vanc; recurred in setting of cardiac event with hypotension.  Setting raises concern for mesenteric ischemia; however, ;pt's abdominal exam is benign. - continue po vancomycin 1236m4 times daily for 10 - 14 days  - FOBT - consider consulting radiology  # [facility]. Tolerated inpatient HD well on 3/4 and 3/6, but experienced SVTs during HD yesterday  -scheduled for dialysis tomorrow   #  PEA arrest - RESOLVED - PEA arrest in the setting of severe AS, dialysis, and possibly volume depletion from C difficile diarrhea.  AS likely a significant contributing factor; patient not a candidate for open valve replacement.  Pt and family met with cardiology today; opted not to proceed with TAVR given high mortality risk from procedure and unclear clinical benefit.  -on tele  -hold metoprolol  -continue amiodarone 20028maily  -continue to monitor patient's blood pressure, and maintain preload with cautious IVF boluses as needed   # Severe Aortic Stenosis - Will not proceed with TAVR as above.   # Bright red blood per rectum - Intermittent blood and rectal pain with defecation for the last 3 months per pt report. Colonoscopy with Dr. MisLyda Jester 2009 showed external hemorroids, removed hyperplastic polyps with  recommended 10 year follow up. This most likely represents hemorrhoidal bleeding (multiple hemorrhoids present on DRE) in the setting of increased stool frequency, vs diverticulosis. Hb currently stable; increased 8.5 -> 9.3.  -no need for GI consult at this time   # Paroxysmal Afib - Patient has been in sinus bradycardiac or NSR this admission.  -hold metoprolol, continued amiodarone at 246m daily as above  -not a candidate for anticoagulation or EP procedures   # QT prolongation - seen on prior EKG's, though mildly higher on admission compared to prior (QTc = 549 from 504).  -continue telemetry  -patient on anti-arrhythmic therapy, not candidate for EP procedure   # CAD - s/p CABG x3 in 2011 (LIMA to LAD, vein to circumflex, vein to PDA), s/p cath Nov 2014 (DES to RCA).  -continue aspirin, plavix, statin   # DM2 - On home Novolog 70/30 on a sliding scale, pt unclear on dosing. Per dietician, pt has not been taking insulin at home or checking blood glucose. Patient's A1c is 8.5. Patient has received 9-10 units of insulin total per day over the weekend, with CBGs ranging 156-216   -continue sensitive SSI  -start pt on novolog 5 units with breakfast / 8 units with dinner -consider prescription for glucometer and strips on discharge  -patient will need close outpatient follow up to ensure good glycemic control   Dispo: PT recommendation on 3/5 that patient be discharged with home health PT with 24 hr supervision/assistance.  This is a MCareers information officerNote.  The care of the patient was discussed with Dr. KMargart Sicklesand the assessment and plan formulated with their assistance.  Please see their attached note for official documentation of the daily encounter.   LOS: 8 days   NCherlyn Baker Med Student 11/06/2013, 11:51 AM

## 2013-11-07 DIAGNOSIS — I10 Essential (primary) hypertension: Secondary | ICD-10-CM

## 2013-11-07 LAB — CBC WITH DIFFERENTIAL/PLATELET
BASOS ABS: 0 10*3/uL (ref 0.0–0.1)
Basophils Relative: 0 % (ref 0–1)
Eosinophils Absolute: 0.6 10*3/uL (ref 0.0–0.7)
Eosinophils Relative: 5 % (ref 0–5)
HCT: 19.8 % — ABNORMAL LOW (ref 36.0–46.0)
Hemoglobin: 6.6 g/dL — CL (ref 12.0–15.0)
LYMPHS PCT: 12 % (ref 12–46)
Lymphs Abs: 1.3 10*3/uL (ref 0.7–4.0)
MCH: 30.4 pg (ref 26.0–34.0)
MCHC: 33.3 g/dL (ref 30.0–36.0)
MCV: 91.2 fL (ref 78.0–100.0)
Monocytes Absolute: 1.4 10*3/uL — ABNORMAL HIGH (ref 0.1–1.0)
Monocytes Relative: 13 % — ABNORMAL HIGH (ref 3–12)
NEUTROS ABS: 7.5 10*3/uL (ref 1.7–7.7)
Neutrophils Relative %: 69 % (ref 43–77)
PLATELETS: 298 10*3/uL (ref 150–400)
RBC: 2.17 MIL/uL — AB (ref 3.87–5.11)
RDW: 15.9 % — ABNORMAL HIGH (ref 11.5–15.5)
WBC: 10.8 10*3/uL — AB (ref 4.0–10.5)

## 2013-11-07 LAB — LACTIC ACID, PLASMA: LACTIC ACID, VENOUS: 1.9 mmol/L (ref 0.5–2.2)

## 2013-11-07 LAB — GLUCOSE, CAPILLARY
GLUCOSE-CAPILLARY: 146 mg/dL — AB (ref 70–99)
GLUCOSE-CAPILLARY: 104 mg/dL — AB (ref 70–99)
GLUCOSE-CAPILLARY: 140 mg/dL — AB (ref 70–99)
Glucose-Capillary: 137 mg/dL — ABNORMAL HIGH (ref 70–99)

## 2013-11-07 LAB — OCCULT BLOOD X 1 CARD TO LAB, STOOL: Fecal Occult Bld: NEGATIVE

## 2013-11-07 LAB — PREPARE RBC (CROSSMATCH)

## 2013-11-07 MED ORDER — LIDOCAINE-PRILOCAINE 2.5-2.5 % EX CREA
1.0000 "application " | TOPICAL_CREAM | CUTANEOUS | Status: DC | PRN
  Filled 2013-11-07: qty 5

## 2013-11-07 MED ORDER — LIDOCAINE HCL (PF) 1 % IJ SOLN: 5.0000 mL | INTRAMUSCULAR | Status: DC | PRN

## 2013-11-07 MED ORDER — SODIUM CHLORIDE 0.9 % IV SOLN: 100.0000 mL | INTRAVENOUS | Status: DC | PRN

## 2013-11-07 MED ORDER — PENTAFLUOROPROP-TETRAFLUOROETH EX AERO
1.0000 | INHALATION_SPRAY | CUTANEOUS | Status: DC | PRN
Start: 2013-11-07 — End: 2013-11-09

## 2013-11-07 MED ORDER — HEPARIN SODIUM (PORCINE) 1000 UNIT/ML DIALYSIS: 1000.0000 [IU] | INTRAMUSCULAR | Status: DC | PRN

## 2013-11-07 MED ORDER — ALTEPLASE 2 MG IJ SOLR
2.0000 mg | Freq: Once | INTRAMUSCULAR | Status: AC | PRN
  Filled 2013-11-07: qty 2

## 2013-11-07 MED ORDER — DOXERCALCIFEROL 4 MCG/2ML IV SOLN
INTRAVENOUS | Status: AC
  Filled 2013-11-07: qty 2

## 2013-11-07 MED ORDER — WHITE PETROLATUM GEL
Status: AC
  Administered 2013-11-07: 11:00:00
  Filled 2013-11-07: qty 5

## 2013-11-07 MED ORDER — SODIUM CHLORIDE 0.9 % IV BOLUS (SEPSIS)
250.0000 mL | Freq: Once | INTRAVENOUS | Status: AC
  Administered 2013-11-07: 250 mL via INTRAVENOUS

## 2013-11-07 MED ORDER — DARBEPOETIN ALFA-POLYSORBATE 60 MCG/0.3ML IJ SOLN
INTRAMUSCULAR | Status: AC
Start: 2013-11-07 — End: 2013-11-08
  Filled 2013-11-07: qty 0.3

## 2013-11-07 MED ORDER — NEPRO/CARBSTEADY PO LIQD
237.0000 mL | ORAL | Status: DC | PRN
  Filled 2013-11-07: qty 237

## 2013-11-07 NOTE — Progress Notes (Signed)
Night Float Interim Progress Note  I went to evaluate [name] alongside Dr. Andrey CampanileWilson this evening to evaluate her Hb down to 6.6.  Upon our visit, She reports having blood BM's that was also confirmed by RN who reports a maroon large BM.  BP is noted to be 90/30s at this time and she is complaining of feeling cold.   Vitals reviewed. General: lying in bed, feeling cold HEENT: EOMI Cardiac: RRR, +SEM Pulm: clear to auscultation bilaterally anterior exam Abd: soft, nontender, nondistended Ext: moving all 4 extremities, trace lower extremity edema, +2dp b/l Neuro: alert and oriented X3--[facility] corrected  [name] is a 73 year old African American female with severe AS and ESRD on HD with severe heart disease, NSTEMI, and diarrhea in setting of Cdiff.  She does not appear to be a candidate for TAVR.  Had another session of HD today.  Currently BP 90/30's with Hb down to 6.6 from 7.9 two days prior.  She does report a large bloody BM this evening.  -type and screen -will bolus 250cc NS for now, goal to try to keep MAP >60 -transfuse 1 unit PRBC -monitor post transfusion Hb and continue to monitor vitals  Signed: Darden PalmerSamaya Kenniel Bergsma, MD PGY-2, Internal Medicine Resident Pager: [phone]  11/07/2013,9:22 PM

## 2013-11-07 NOTE — Progress Notes (Signed)
  [facility] Progress Note   Subjective: Up in chair, no complaints  Filed Vitals:   11/06/13 1640 11/06/13 1948 11/06/13 2328 11/07/13 0500  BP: 124/49 123/63 110/35   Pulse:  92 63   Temp: 97.8 F (36.6 C) 97.6 F (36.4 C) 97.7 F (36.5 C)   TempSrc: Oral Oral Oral   Resp: 19 23 22    Height:      Weight:    56.1 kg (123 lb 10.9 oz)  SpO2: 96% 96% 95%    Exam: In chair, no distress, calm No jvd Rales dec'd at bases o/w clear RRR 3/6 SEM Abd soft, nt, nd No LE edema LUA AV fistula patent   Dialysis: MWF [facility] 3.5h (^'d 4h here)   F160  52kg   3K/2.25 Bath   Prof 4 Heparin none Hect 2  EPO 2200    Labwork: 10.5/24%/1600    pth 433  Assessment: 1 Cardiac arrest 2 Severe AS- not a candidate for procedures per surgeons 3 CAD hx CABG w graft disease last cath 4 AFib on amio 5 ESRD on hemodialysis 6 Cdif infection 7 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 8 DM 9 COPD 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  Guarded prognosis, agree with pall care consult for Willette AlmaGOC    Rob Farhana Fellows MD  pager [phone]    cell [phone]  11/07/2013, 10:46 AM     Recent Labs Lab 11/02/13 0921 11/05/13 1300 11/05/13 2000 11/06/13 0117 11/06/13 0247  NA 132* 126* 141 132* 135*  K 4.0 4.0 <2.2* 4.1 4.0  CL 94* 87* 112 93* 94*  CO2 24 24 18* 24 26  GLUCOSE 159* 192* 87 137* 138*  BUN 26* 46* 12 21 20   CREATININE 4.38* 5.13* 1.67* 3.06* 3.01*  CALCIUM 9.0 8.9 5.2* 9.0 10.1  PHOS 3.7 4.0  --   --   --     Recent Labs Lab 11/02/13 0921 11/05/13 1300  ALBUMIN 2.6* 2.4*    Recent Labs Lab 11/01/13 0230 11/02/13 1000 11/05/13 1300  WBC 8.0 5.1 7.7  NEUTROABS 5.6  --   --   HGB 8.5* 9.3* 7.9*  HCT 25.7* 28.7* 23.0*  MCV 92.8 92.9 89.5  PLT 232 281 345   . amiodarone  200 mg Oral Daily  . aspirin  81 mg Oral Daily  . atorvastatin  40 mg Oral q1800  . clopidogrel  75 mg Oral Q breakfast  . darbepoetin (ARANESP) injection -  DIALYSIS  60 mcg Intravenous Q Wed-HD  . doxercalciferol  2 mcg Intravenous Q M,W,F-HD  . feeding supplement (RESOURCE BREEZE)  1 Container Oral TID BM  . heparin  5,000 Units Subcutaneous 3 times per day  . insulin aspart  0-9 Units Subcutaneous TID WC  . sodium chloride  3 mL Intravenous Q12H  . vancomycin  125 mg Oral QID  . white petrolatum       . phenylephrine (NEO-SYNEPHRINE) Adult infusion Stopped (11/05/13 2315)   acetaminophen, acetaminophen, loratadine

## 2013-11-07 NOTE — Progress Notes (Signed)
I have read and agree with the medical students note,  Chris Daiquan Resnik, MD 

## 2013-11-07 NOTE — Progress Notes (Signed)
Subjective: Patient with continued diarrhea, with 16 stools over past 24 hours.  She denies lightheadedness, CP, SOB, abdominal pain, rectal pain, subjective fevers or chills.  Her nurse reported that diarrhea was black and ringed with blood.  Objective: Vital signs in last 24 hours: Temp:  [97.6 F (36.4 C)-98.3 F (36.8 C)] 98 F (36.7 C) (03/11 1222) Pulse Rate:  [61-92] 79 (03/11 1500) Resp:  [18-24] 20 (03/11 1100) BP: (93-139)/(35-63) 106/47 mmHg (03/11 1500) SpO2:  [95 %-100 %] 98 % (03/11 1222) Weight:  [55.2 kg (121 lb 11.1 oz)-56.1 kg (123 lb 10.9 oz)] 55.2 kg (121 lb 11.1 oz) (03/11 1222) Weight change: 0.7 kg (1 lb 8.7 oz) 03/10 0701 - 03/11 0700 In: 1080 [P.O.:1080] Out: -   Intake/Output Summary (Last 24 hours) at 11/07/13 1514 Last data filed at 11/07/13 0915  Gross per 24 hour  Intake    720 ml  Output      0 ml  Net    720 ml    Physical Exam General: On side of bed in no acute distress. Eyes: EOMI.  Conjunctivae normal, sclerae clear and anicteric. HENT: Oropharyngeal mucous membranes pink and moist. Pulm: Normal respiratory rate and effort.  CTAB, no wheezes or crackles. Cardio: RRR, S1 and S2 with harsh holosystolic murmur, unchanged. Abdomen: Normoactive bowel sounds. Soft, non-tender, non-distended. Extremities: Warm and well perfused, with no edema or cyanosis.  Bandage placed over right lower extremity lesion. Neuro: Alert, oriented, and appropriate.  CN II-XII grossly intact.  Lab Results: Basic Metabolic Panel:  Recent Labs Lab 11/02/13 0921  11/05/13 1300 11/05/13 2000 11/06/13 0117 11/06/13 0247  NA 132*  --  126* 141 132* 135*  K 4.0  --  4.0 <2.2* 4.1 4.0  CL 94*  --  87* 112 93* 94*  CO2 24  --  24 18* 24 26  GLUCOSE 159*  --  192* 87 137* 138*  BUN 26*  --  46* 12 21 20   CREATININE 4.38*  --  5.13* 1.67* 3.06* 3.01*  CALCIUM 9.0  --  8.9 5.2* 9.0 10.1  MG  --   < >  --  1.1*  --  1.8  PHOS 3.7  --  4.0  --   --   --   < > =  values in this interval not displayed. Liver Function Tests:  Recent Labs Lab 11/02/13 0921 11/05/13 1300  ALBUMIN 2.6* 2.4*   CBC:  Recent Labs Lab 11/01/13 0230 11/02/13 1000 11/05/13 1300  WBC 8.0 5.1 7.7  NEUTROABS 5.6  --   --   HGB 8.5* 9.3* 7.9*  HCT 25.7* 28.7* 23.0*  MCV 92.8 92.9 89.5  PLT 232 281 345   Cardiac Enzymes:  Recent Labs Lab 11/06/13 1013 11/06/13 1515 11/06/13 2132  TROPONINI 1.05* 0.73* 0.69*   CBG:  Recent Labs Lab 11/05/13 1149 11/05/13 2125 11/06/13 0739 11/06/13 1134 11/06/13 1637 11/06/13 2139  GLUCAP 150* 153* 138* 126* 152* 149*   Misc. Labs:  None  Studies/Results:  No new studies  Medications: I have reviewed the patient's current medications. Scheduled Meds: . amiodarone  200 mg Oral Daily  . aspirin  81 mg Oral Daily  . atorvastatin  40 mg Oral q1800  . clopidogrel  75 mg Oral Q breakfast  . darbepoetin      . darbepoetin (ARANESP) injection - DIALYSIS  60 mcg Intravenous Q Wed-HD  . doxercalciferol      . doxercalciferol  2 mcg  Intravenous Q M,W,F-HD  . feeding supplement (RESOURCE BREEZE)  1 Container Oral TID BM  . heparin  5,000 Units Subcutaneous 3 times per day  . insulin aspart  0-9 Units Subcutaneous TID WC  . sodium chloride  3 mL Intravenous Q12H  . vancomycin  125 mg Oral QID   Continuous Infusions: . phenylephrine (NEO-SYNEPHRINE) Adult infusion Stopped (11/05/13 2315)   PRN Meds:.acetaminophen, acetaminophen, loratadine Assessment/Plan: Ms. Dehaas is a 73 year old dialysis patient with multiple cardiac conditions including CAD, aortic stenosis, and PAF who presented on 10/29/13 after receiving 10 minutes of CPR with ROSC after an apparent cardiac arrest during her dialysis session.  She tolerated inpatient HD well on 3/4 and 3/6, but had an additional cardiac event on 3/9.  She has also tested positive for C diff colitis, initially resolved on vanc but now with recurrence of diarrhea.  # Diarrhea  with hematochezia; C diff ag positive on 3/3 - Vancomycin day 5; diarrhea initially resolved after 72hr on vanc; recurred in setting of cardiac event with hypotension.  Most likely refractory C diff colitis; setting raises concern for mesenteric ischemia, but pt's abdominal exam is benign.  Report of dark stool with blood raises concern for GI bleed vs hemorrhoids - continue po vancomycin 133m 4 times daily for 10 - 14 days  - CBC, lactate, FOBT  # [facility] Tolerated inpatient HD well on 3/4 and 3/6, but experienced SVTs during HD yesterday  -HD today -schedule conversation with patient and family about how to manage future cardiac events that may arise during HD  # Hypotension - RESOLVED- No further episodes of hypotension since HD on 3/9.  Tolerating HD well today. - continue to monitor - hold off on metoprolol in future  # Narrow complex arrhythmia - RESOLVED- developed in the setting of HD on 3/9.  P waves indiscernible in setting of tachycardia and pre-existing bundle blocks, EKGs read as SVT but cardiology noted irregularly irregular rhythm concerning for Afib in pt with known PAF - will hold metoprolol in future - if SVT vs Afib develops in future, consider cardioversion with IV amio - appreciate cardiology's involvement  # PEA arrest - RESOLVED - PEA arrest in the setting of severe AS, dialysis, and possibly volume depletion from C difficile diarrhea.  AS likely a significant contributing factor; patient not a candidate for open valve replacement.  Pt and family met with cardiology today; opted not to proceed with TAVR given high mortality risk from procedure and unclear clinical benefit.  -on tele  -hold metoprolol  -continue amiodarone 2064mdaily  -continue to monitor patient's blood pressure, and maintain preload with cautious IVF boluses as needed   # Severe Aortic Stenosis - Will not proceed with TAVR as above.   # Bright red blood per  rectum - Intermittent blood and rectal pain with defecation for the last 3 months per pt report. Colonoscopy with Dr. MiLyda Jestern 2009 showed external hemorroids, removed hyperplastic polyps with recommended 10 year follow up. This most likely represents hemorrhoidal bleeding (multiple hemorrhoids present on DRE) in the setting of increased stool frequency, vs diverticulosis. Hb currently stable; increased 8.5 -> 9.3.  -no need for GI consult at this time   # Paroxysmal Afib - Patient has been in sinus bradycardiac or NSR this admission.  -hold metoprolol, continued amiodarone at 20047maily as above  -not a candidate for anticoagulation or EP procedures   # QT prolongation -  seen on prior EKG's, though mildly higher on admission compared to prior (QTc = 549 from 504).  -continue telemetry  -patient on anti-arrhythmic therapy, not candidate for EP procedure   # CAD - s/p CABG x3 in 2011 (LIMA to LAD, vein to circumflex, vein to PDA), s/p cath Nov 2014 (DES to RCA).  -continue aspirin, plavix, statin   # DM2 - On home Novolog 70/30 on a sliding scale, pt unclear on dosing. Per dietician, pt has not been taking insulin at home or checking blood glucose. Patient's A1c is 8.5. Patient has received 9-10 units of insulin total per day over the weekend, with CBGs ranging 156-216  -continue sensitive SSI  -start pt on novolog 5 units with breakfast / 8 units with dinner  Dispo: PT recommendation on 3/5 and 3/8 that patient be discharged with home health PT with 24 hr supervision/assistance.  This is a Careers information officer Note.  The care of the patient was discussed with Dr. Margart Sickles and the assessment and plan formulated with their assistance.  Please see their attached note for official documentation of the daily encounter.   LOS: 9 days   Cherlyn Labella, Med Student 11/07/2013, 3:14 PM

## 2013-11-07 NOTE — Progress Notes (Signed)
Subjective:  Patient continues to have numerous loose BM. No chest pain on SOB.  Objective: Vital signs in last 24 hours: Filed Vitals:   11/06/13 1640 11/06/13 1948 11/06/13 2328 11/07/13 0500  BP: 124/49 123/63 110/35   Pulse:  92 63   Temp: 97.8 F (36.6 C) 97.6 F (36.4 C) 97.7 F (36.5 C)   TempSrc: Oral Oral Oral   Resp: _0 Height:      Weight:    123 lb 10.9 oz (56.1 kg)  SpO2: 96% 96% 95%    Weight change: 1 lb 8.7 oz (0.7 kg)  Intake/Output Summary (Last 24 hours) at 11/07/13 0758 Last data filed at 11/06/13 1700  Gross per 24 hour  Intake   1080 ml  Output      0 ml  Net   1080 ml   Physical Exam  Cardiovascular: Normal rate, regular rhythm and intact distal pulses.   Murmur heard. Pulmonary/Chest: Effort normal and breath sounds normal. No respiratory distress. She has no wheezes. She has no rales.  Abdominal: Soft. Bowel sounds are normal. She exhibits no distension. There is no tenderness. There is no rebound and no guarding.  Musculoskeletal: She exhibits no edema and no tenderness.  Psychiatric: She has a normal mood and affect. Her behavior is normal.    Lab Results: Basic Metabolic Panel:  Recent Labs Lab 11/02/13 0921  11/05/13 1300 11/05/13 2000 11/06/13 0117 11/06/13 0247  NA 132*  --  126* 141 132* 135*  K 4.0  --  4.0 <2.2* 4.1 4.0  CL 94*  --  87* 112 93* 94*  CO2 24  --  24 18* 24 26  GLUCOSE 159*  --  192* 87 137* 138*  BUN 26*  --  46* _1 CREATININE 4.38*  --  5.13* 1.67* 3.06* 3.01*  CALCIUM 9.0  --  8.9 5.2* 9.0 10.1  MG  --   < >  --  1.1*  --  1.8  PHOS 3.7  --  4.0  --   --   --   < > = values in this interval not displayed. Liver Function Tests:  Recent Labs Lab 11/02/13 0921 11/05/13 1300  ALBUMIN 2.6* 2.4*   CBC:  Recent Labs Lab 10/31/13 0845 11/01/13 0230 11/02/13 1000 11/05/13 1300  WBC 11.8* 8.0 5.1 7.7  NEUTROABS 9.2* 5.6  --   --   HGB 9.1* 8.5* 9.3* 7.9*  HCT 26.8* 25.7* 28.7*  23.0*  MCV 90.5 92.8 92.9 89.5  PLT 307 232 281 345   CBG:  Recent Labs Lab 11/05/13 1149 11/05/13 2125 11/06/13 0739 11/06/13 1134 11/06/13 1637 11/06/13 2139  GLUCAP 150* 153* 138* 126* 152* 149*    Micro Results: Recent Results (from the past 240 hour(s))  MRSA PCR SCREENING     Status: None   Collection Time    10/29/13  1:59 PM      Result Value Ref Range Status   MRSA by PCR NEGATIVE  NEGATIVE Final   Comment:            The GeneXpert MRSA Assay (FDA     approved for NASAL specimens     only), is one component of a     comprehensive MRSA colonization     surveillance program. It is not     intended to diagnose MRSA     infection nor to guide or     monitor treatment for  MRSA infections.  CLOSTRIDIUM DIFFICILE BY PCR     Status: Abnormal   Collection Time    10/30/13  2:55 PM      Result Value Ref Range Status   C difficile by pcr POSITIVE (*) NEGATIVE Final   Comment: CRITICAL RESULT CALLED TO, READ BACK BY AND VERIFIED WITH:     STANFIELD RN 17:20 10/30/13 ([email or web address])  MRSA PCR SCREENING     Status: None   Collection Time    11/05/13  9:16 PM      Result Value Ref Range Status   MRSA by PCR NEGATIVE  NEGATIVE Final   Comment:            The GeneXpert MRSA Assay (FDA     approved for NASAL specimens     only), is one component of a     comprehensive MRSA colonization     surveillance program. It is not     intended to diagnose MRSA     infection nor to guide or     monitor treatment for     MRSA infections.   Medications: I have reviewed the patient's current medications. Scheduled Meds: . amiodarone  200 mg Oral Daily  . aspirin  81 mg Oral Daily  . atorvastatin  40 mg Oral q1800  . clopidogrel  75 mg Oral Q breakfast  . darbepoetin (ARANESP) injection - DIALYSIS  60 mcg Intravenous Q Wed-HD  . doxercalciferol  2 mcg Intravenous Q M,W,F-HD  . feeding supplement (RESOURCE BREEZE)  1 Container Oral TID BM  . heparin  5,000 Units Subcutaneous 3  times per day  . insulin aspart  0-9 Units Subcutaneous TID WC  . sodium chloride  3 mL Intravenous Q12H  . vancomycin  125 mg Oral QID   Continuous Infusions: . phenylephrine (NEO-SYNEPHRINE) Adult infusion Stopped (11/05/13 2315)   PRN Meds:.acetaminophen, acetaminophen, loratadine Assessment/Plan: Principal Problem:   Clostridium difficile colitis Active Problems:   End stage renal disease   DM (diabetes mellitus)   HTN (hypertension)   HLD (hyperlipidemia)   COPD (chronic obstructive pulmonary disease)   Aortic stenosis, moderate at cath 07/11/13   PAF (paroxysmal atrial fibrillation)   CAD- CABG X 3 09/2009, HSRA/DES to native RCA 07/11/13   Smoker   Atrial fibrillation   Chronic right-sided heart failure   Cardiac arrest   Sinus bradycardia   Aortic stenosis  Multifactorial Heart and Kidney Disease The patient has severe heart disease that is complicated by her ESRD. She appears to have trouble tolerating HD, with recent episodes of PEA arrest and SVT complicated by hypotension both occurring during HD. Given her severe AS, CAD, and multiple comorbidities, the patient was evaluated for interventions than may benefit her in terms of quality of life or longevity. The multidisciplinary valve team thoughtfully evaluated the patient for open AS repair combined with or without redo CABG as well as TAVR. It was felt that her comorbidities preclude her for open intervention. The risk of mortality as a results of TAVR was estimated at 50%. Thus, TAVR was determined to be not indicated for this patient. The TAVR team has recommended a palliative care consult to establish goals of care given her poor prognosis. - Plan to discuss case with the nephrology and cardiology team for rec's regarding appropriate goals of care - Spoke with patient and her son [name]. Will have a family meeting tomorrow to discuss code status and prognosis.  Diarrhea in setting of C Diff Ag Positivity The  patient  had 16 episodes of diarrhea ON. I suspect that the patients episode of hypotension could have predisposed her to ischemic colitis. Another possibility is a worsening of her C Diff. I believe this may be less likely given the patients initial response to vancomycin. Patient does not report abdominal pain at this time.   - continue po vancomycin 15m 4 times daily  - Checking Lactic Acid - will consider CTA abdomen if elevated lactic acid  NSTEMI The patient had elevated troponin after becoming hypotensive on 3/9. Troponin peaked at 1.05 and down trended to .73 and .643 THis is likely due to the patients known CAD. She has had multiple NSTEMI in the past. - Appreciate cardiology rec's  Hypotension- Resolved The etiology of the patients hypotension is likely multifactorial. Key aspects include SVT and medication side effect (IV metoprolol). It remains imperative that the patients afterload be maintained as the patient has severe AS. Phenylephrine has been weaned. Ptnt has good BP. - Transfer to SDU - Appreciate rec's per cardiology - 250 cc bolus as needed to maintain MAP greater than 60  SVT- Resolved The patient has a recent history of Afib with RVR. Thus this may be the etiology of the patients recent episode of SVT. Another cause of the patients SVT is increased demand during HD leading to reflex tachycardia as the patient had a recent cardiac arrest during recent HD which was felt to be a result of hypovolemia and hypotension. The patient did not tolerate IV metoprolol, will plan to avoid this approach in the future. However, the patients SVT may be difficult to treat given her complicated cardiac history. Please see cardiology note for further discussion. - Will consider amio drip per cardiology if SVT returns.  Severe Aortic Stenosis  It appears that the TAVR team does not believe that the patient would benefit from open AS repair with or without redo CABG. Further, the note states that TAVR  is unlikely to provide significant improved QOL or length of life. Yesterday, Dr. CBurt Knackmet with patient and her family. The decision was made to not pursue TAVR as the expected mortality rate from TAVR is 50%.  - monitor volume status  - maintain MAP >60 with 250 cc fluid boluses.   ESRD - on MWF HD.  - HD per nephrology   Dispo: Disposition is deferred at this time, awaiting improvement of current medical problems.  Anticipated discharge in approximately 1-2 day(s).   The patient does have a current PCP (Imran PGertie Baron MD) and [facility].  The patient does have transportation limitations that hinder transportation to clinic appointments.  .Services Needed at time of discharge: Y = Yes, Blank = No PT:   OT:   RN:   Equipment:   Other:     LOS: 9 days   CMarrion Coy MD 11/07/2013, 7:58 AM

## 2013-11-07 NOTE — Progress Notes (Signed)
SUBJECTIVE:  No complaints  OBJECTIVE:   Vitals:   Filed Vitals:   11/06/13 1640 11/06/13 1948 11/06/13 2328 11/07/13 0500  BP: 124/49 123/63 110/35   Pulse:  92 63   Temp: 97.8 F (36.6 C) 97.6 F (36.4 C) 97.7 F (36.5 C)   TempSrc: Oral Oral Oral   Resp: 19 23 22    Height:      Weight:    123 lb 10.9 oz (56.1 kg)  SpO2: 96% 96% 95%    I&O's:   Intake/Output Summary (Last 24 hours) at 11/07/13 1610 Last data filed at 11/06/13 1700  Gross per 24 hour  Intake    840 ml  Output      0 ml  Net    840 ml   TELEMETRY: Reviewed telemetry pt in NSR:     PHYSICAL EXAM General: Well developed, well nourished, in no acute distress Head: Eyes PERRLA, No xanthomas.   Normal cephalic and atramatic  Lungs:   Scattered wheezes Heart:   HRRR S1 S2 Pulses are 2+ & equal. Abdomen: Bowel sounds are positive, abdomen soft and non-tender without masses  Extremities:   No clubbing, cyanosis or edema.  DP +1 Neuro: Alert and oriented X 3. Psych:  Good affect, responds appropriately   LABS: Basic Metabolic Panel:  Recent Labs  96/04/54 1300 11/05/13 2000 11/06/13 0117 11/06/13 0247  NA 126* 141 132* 135*  K 4.0 <2.2* 4.1 4.0  CL 87* 112 93* 94*  CO2 24 18* 24 26  GLUCOSE 192* 87 137* 138*  BUN 46* 12 21 20   CREATININE 5.13* 1.67* 3.06* 3.01*  CALCIUM 8.9 5.2* 9.0 10.1  MG  --  1.1*  --  1.8  PHOS 4.0  --   --   --    Liver Function Tests:  Recent Labs  11/05/13 1300  ALBUMIN 2.4*   No results found for this basename: LIPASE, AMYLASE,  in the last 72 hours CBC:  Recent Labs  11/05/13 1300  WBC 7.7  HGB 7.9*  HCT 23.0*  MCV 89.5  PLT 345   Cardiac Enzymes:  Recent Labs  11/06/13 1013 11/06/13 1515 11/06/13 2132  TROPONINI 1.05* 0.73* 0.69*   BNP: No components found with this basename: POCBNP,  D-Dimer: No results found for this basename: DDIMER,  in the last 72 hours Hemoglobin A1C: No results found for this basename: HGBA1C,  in the last 72  hours Fasting Lipid Panel: No results found for this basename: CHOL, HDL, LDLCALC, TRIG, CHOLHDL, LDLDIRECT,  in the last 72 hours Thyroid Function Tests: No results found for this basename: TSH, T4TOTAL, FREET3, T3FREE, THYROIDAB,  in the last 72 hours Anemia Panel: No results found for this basename: VITAMINB12, FOLATE, FERRITIN, TIBC, IRON, RETICCTPCT,  in the last 72 hours Coag Panel:   Lab Results  Component Value Date   INR 1.21 10/08/2013   INR 1.25 10/06/2013   INR 1.12 06/19/2013    RADIOLOGY: Dg Chest 1 View  10/27/2013   CLINICAL DATA:  Status post right thoracentesis  EXAM: CHEST - 1 VIEW  COMPARISON:  US THORACENTESIS ASP PLEURAL SPACE W/IMG GUIDE dated 10/27/2013  FINDINGS: Status post CABG. Moderate cardiac enlargement. Left lung is clear.  On the right side, there is a small, decreased right pleural effusion status post thoracentesis. There is no pneumothorax.  IMPRESSION: No pneumothorax   Electronically Signed   By: Esperanza Heir M.D.   On: 10/27/2013 12:07   Dg Chest Portable 1 View  10/29/2013   CLINICAL DATA:  Cardiopulmonary arrest. Hypotension. Shortness of breath.  EXAM: PORTABLE CHEST - 1 VIEW  COMPARISON:  10/27/2013  FINDINGS: Prior CABG. Moderately enlarged cardiopericardial silhouette with low lung volumes, small right pleural effusion, and interstitial accentuation. Low lung volumes. No pneumothorax or well-defined rib fracture.  IMPRESSION: 1. Cardiomegaly potentially with mild interstitial edema and a small right pleural effusion.   Electronically Signed   By: Herbie Baltimore M.D.   On: 10/29/2013 09:55   Dg Chest Port 1 View  10/24/2013   CLINICAL DATA:  Hemodialysis today.  Followup pleural effusion.  EXAM: PORTABLE CHEST - 1 VIEW  COMPARISON:  Q 11/16/2013  FINDINGS: Moderate to large pleural effusion on the right appears slightly larger than it did on the prior study.  No pulmonary edema. No convincing infiltrate. No left pleural effusion.  Changes from CABG  surgery are stable. The cardiac silhouette is normal in size. Normal mediastinal contours.  IMPRESSION: 1. Moderate to large right pleural effusion has mildly increased from the prior study. No other change. No pulmonary edema or convincing infiltrate.   Electronically Signed   By: Amie Portland M.D.   On: 10/24/2013 19:41   Dg Chest Port 1 View  10/22/2013   CLINICAL DATA:  Chest pain  EXAM: PORTABLE CHEST - 1 VIEW  COMPARISON:  10/06/2013  FINDINGS: Cardiomegaly again noted. Status post CABG. Stable right pleural effusion with right basilar atelectasis or infiltrate. No pulmonary edema.  IMPRESSION: Cardiomegaly. Status post CABG. Stable right pleural effusion with right basilar atelectasis or infiltrate.   Electronically Signed   By: Natasha Mead M.D.   On: 10/22/2013 13:05   US Thoracentesis Asp Pleural Space W/img Guide  10/27/2013   CLINICAL DATA:  Shortness of breath, right-sided pleural effusion. Request therapeutic thoracentesis.  EXAM: ULTRASOUND GUIDED right THORACENTESIS  COMPARISON:  None.  FINDINGS: A total of approximately 1 L of clear, amber colored fluid was removed. A fluid sample was notsent for laboratory analysis.  IMPRESSION: Successful ultrasound guided right thoracentesis yielding 1 L of pleural fluid.  Read by: Brayton El PA-C  PROCEDURE: An ultrasound guided thoracentesis was thoroughly discussed with the patient and questions answered. The benefits, risks, alternatives and complications were also discussed. The patient understands and wishes to proceed with the procedure. Written consent was obtained.  Ultrasound was performed to localize and mark an adequate pocket of fluid in the right chest. The area was then prepped and draped in the normal sterile fashion. 1% Lidocaine was used for local anesthesia. Under ultrasound guidance a 19 gauge Yueh catheter was introduced. Thoracentesis was performed. The catheter was removed and a dressing applied.  Complications:  None immediate    Electronically Signed   By: Simonne Come M.D.   On: 10/27/2013 11:45    Assessment/ Plan:  Principal Problem:  Clostridium difficile colitis  Active Problems:  End stage renal disease  DM (diabetes mellitus)  HTN (hypertension)  Aortic stenosis, moderate at cath 07/11/13  PAF (paroxysmal atrial fibrillation)  Atrial fibrillation  Cardiac arrest  Sinus bradycardia  Aortic stenosis   1. PEA arrest: Most likely multifactorial - volume changes with dialysis, aortic stenosis, recent diarrhea. She did not have a shockable rhythm and regain her rhythm with CPR and IVF.  BP is currently stable  2. Aortic stenosis: She has been seen by the TAVR team and they felt she was at too high risk for TAVR and have recommended Palliative Care consult  3. Atrial fib: Had  episode of SVT 2 nights ago with profound hypotension after receiving Lopressor IV. Currently in NSR, on PO amio. Not a coumadin candidate. Continue Amio PO.  4. CAD S/p CABG - cont. Current meds  Disposition: She appears to be stable from a cardiac standpoint. Continue current meds. Recommend Palliative Care consult.  OK to transfer out to tele bed.  Will sign off.  Call with any questions.       Quintella ReichertURNER,Keiden Deskin R, MD  11/07/2013  9:39 AM

## 2013-11-08 LAB — CBC WITH DIFFERENTIAL/PLATELET
Basophils Absolute: 0 10*3/uL (ref 0.0–0.1)
Basophils Relative: 0 % (ref 0–1)
Eosinophils Absolute: 0.5 10*3/uL (ref 0.0–0.7)
Eosinophils Relative: 4 % (ref 0–5)
HCT: 24.2 % — ABNORMAL LOW (ref 36.0–46.0)
HEMOGLOBIN: 8.1 g/dL — AB (ref 12.0–15.0)
Lymphocytes Relative: 13 % (ref 12–46)
Lymphs Abs: 1.4 10*3/uL (ref 0.7–4.0)
MCH: 30.3 pg (ref 26.0–34.0)
MCHC: 33.5 g/dL (ref 30.0–36.0)
MCV: 90.6 fL (ref 78.0–100.0)
MONOS PCT: 11 % (ref 3–12)
Monocytes Absolute: 1.3 10*3/uL — ABNORMAL HIGH (ref 0.1–1.0)
NEUTROS ABS: 8.2 10*3/uL — AB (ref 1.7–7.7)
Neutrophils Relative %: 72 % (ref 43–77)
Platelets: 269 10*3/uL (ref 150–400)
RBC: 2.67 MIL/uL — ABNORMAL LOW (ref 3.87–5.11)
RDW: 15.9 % — ABNORMAL HIGH (ref 11.5–15.5)
WBC: 11.5 10*3/uL — AB (ref 4.0–10.5)

## 2013-11-08 LAB — GLUCOSE, CAPILLARY
GLUCOSE-CAPILLARY: 111 mg/dL — AB (ref 70–99)
GLUCOSE-CAPILLARY: 202 mg/dL — AB (ref 70–99)
Glucose-Capillary: 126 mg/dL — ABNORMAL HIGH (ref 70–99)
Glucose-Capillary: 133 mg/dL — ABNORMAL HIGH (ref 70–99)
Glucose-Capillary: 134 mg/dL — ABNORMAL HIGH (ref 70–99)

## 2013-11-08 LAB — CBC
HEMATOCRIT: 25.3 % — AB (ref 36.0–46.0)
HEMOGLOBIN: 8.6 g/dL — AB (ref 12.0–15.0)
MCH: 30.7 pg (ref 26.0–34.0)
MCHC: 34 g/dL (ref 30.0–36.0)
MCV: 90.4 fL (ref 78.0–100.0)
Platelets: 269 10*3/uL (ref 150–400)
RBC: 2.8 MIL/uL — ABNORMAL LOW (ref 3.87–5.11)
RDW: 15 % (ref 11.5–15.5)
WBC: 14.5 10*3/uL — ABNORMAL HIGH (ref 4.0–10.5)

## 2013-11-08 LAB — BASIC METABOLIC PANEL
BUN: 27 mg/dL — ABNORMAL HIGH (ref 6–23)
CALCIUM: 9.2 mg/dL (ref 8.4–10.5)
CO2: 24 mEq/L (ref 19–32)
CREATININE: 2.32 mg/dL — AB (ref 0.50–1.10)
Chloride: 94 mEq/L — ABNORMAL LOW (ref 96–112)
GFR calc Af Amer: 23 mL/min — ABNORMAL LOW (ref >90)
GFR calc non Af Amer: 20 mL/min — ABNORMAL LOW (ref >90)
GLUCOSE: 134 mg/dL — AB (ref 70–99)
Potassium: 3.9 mEq/L (ref 3.7–5.3)
Sodium: 136 mEq/L — ABNORMAL LOW (ref 137–147)

## 2013-11-08 MED ORDER — MAGNESIUM SULFATE 40 MG/ML IJ SOLN
2.0000 g | Freq: Once | INTRAMUSCULAR | Status: AC
  Administered 2013-11-08: 2 g via INTRAVENOUS
  Filled 2013-11-08: qty 50

## 2013-11-08 NOTE — Progress Notes (Signed)
  [facility] Progress Note   Subjective: Stable, diarrhea better, transfused 1u prbc last night. Family meeting scheduled by MTS for later today.   Filed Vitals:   11/08/13 0600 11/08/13 0700 11/08/13 0800 11/08/13 1017  BP: 131/40 137/45 115/40 106/34  Pulse:   67   Temp:   98.4 F (36.9 C)   TempSrc:   Oral   Resp: 15 21 19 24   Height:      Weight:      SpO2:   100%    Exam: In chair, no distress, calm No jvd Rales dec'd at bases o/w clear RRR 3/6 SEM Abd soft, nt, nd No LE edema LUA AV fistula patent   Dialysis: MWF [place] 3.5h (^'d 4h here)   F160  52kg   3K/2.25 Bath   Prof 4 Heparin none Hect 2  EPO 2200    Labwork: 10.5/24%/1600    pth 433  Assessment: 1 Cardiac arrest 2 Severe AS- not a candidate for procedures per surgeons 3 Cdif infection- diarrhea better today, on po vanc 4 AFib on amio 5 ESRD on hemodialysis 6 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, 1u prbc 3/11, Hb 8.6 today 7 DM 8 COPD 9 2HPT cont vit D, no binder 10 Vol- no vol excess, up 1-2kg   Plan- HD tomorrow, UF 1-2kg; for family mtg today to discuss Willette AlmaGOC    Rob Taitum Menton MD  pager [phone]    cell [phone]  11/08/2013, 10:42 AM     Recent Labs Lab 11/02/13 0921 11/05/13 1300  11/06/13 0117 11/06/13 0247 11/08/13 0716  NA 132* 126*  < > 132* 135* 136*  K 4.0 4.0  < > 4.1 4.0 3.9  CL 94* 87*  < > 93* 94* 94*  CO2 24 24  < > 24 26 24   GLUCOSE 159* 192*  < > 137* 138* 134*  BUN 26* 46*  < > 21 20 27*  CREATININE 4.38* 5.13*  < > 3.06* 3.01* 2.32*  CALCIUM 9.0 8.9  < > 9.0 10.1 9.2  PHOS 3.7 4.0  --   --   --   --   < > = values in this interval not displayed.  Recent Labs Lab 11/02/13 0921 11/05/13 1300  ALBUMIN 2.6* 2.4*    Recent Labs Lab 11/05/13 1300 11/07/13 1725 11/08/13 0716  WBC 7.7 10.8* 14.5*  NEUTROABS  --  7.5  --   HGB 7.9* 6.6* 8.6*  HCT 23.0* 19.8* 25.3*  MCV 89.5 91.2 90.4  PLT 345 298 269   . amiodarone  200 mg Oral Daily  . aspirin  81 mg  Oral Daily  . atorvastatin  40 mg Oral q1800  . clopidogrel  75 mg Oral Q breakfast  . darbepoetin (ARANESP) injection - DIALYSIS  60 mcg Intravenous Q Wed-HD  . doxercalciferol  2 mcg Intravenous Q M,W,F-HD  . feeding supplement (RESOURCE BREEZE)  1 Container Oral TID BM  . heparin  5,000 Units Subcutaneous 3 times per day  . insulin aspart  0-9 Units Subcutaneous TID WC  . sodium chloride  3 mL Intravenous Q12H  . vancomycin  125 mg Oral QID   . phenylephrine (NEO-SYNEPHRINE) Adult infusion Stopped (11/05/13 2315)   sodium chloride, sodium chloride, acetaminophen, acetaminophen, feeding supplement (NEPRO CARB STEADY), heparin, lidocaine (PF), lidocaine-prilocaine, loratadine, pentafluoroprop-tetrafluoroeth

## 2013-11-08 NOTE — Progress Notes (Signed)
I spoke with [name] and her family [name](Jackie and Everlean AlstromMaurice) regarding code status. I shared with the family and her the poor prognosis of her condition. The patient and the family elected to enact a limited code. The patient requests to be DNR and DNI. However, she would like to receive aggressive treatments such as fluids, antiarrhythmics, pressors, and defibrillation as medically indicated. I have entered this order.   Angelina Sheriffhris Adorian Gwynne, MD

## 2013-11-08 NOTE — Progress Notes (Addendum)
Subjective:  Patients Hg dropped ON to 6.6. Night team transfused 1 U PRBC. Patient feels much improved this AM. No complaints. Diarrhea resolving.  Objective: Vital signs in last 24 hours: Filed Vitals:   11/08/13 0200 11/08/13 0300 11/08/13 0400 11/08/13 0500  BP: 109/36 109/68 121/30   Pulse:      Temp: 98.6 F (37 C) 98.2 F (36.8 C) 98.6 F (37 C)   TempSrc:  Oral Oral   Resp: 22 25 22    Height:      Weight:    117 lb 4.6 oz (53.2 kg)  SpO2:   100%    Weight change: -1 lb 15.8 oz (-0.9 kg)  Intake/Output Summary (Last 24 hours) at 11/08/13 1771 Last data filed at 11/07/13 2307  Gross per 24 hour  Intake    550 ml  Output   1569 ml  Net  -1019 ml   Physical Exam  Constitutional: She is oriented to person, place, and time. She appears well-developed and well-nourished.  HENT:  Head: Normocephalic and atraumatic.  Cardiovascular: Normal rate, regular rhythm and intact distal pulses.   Murmur heard. crescendo decresendo 4/6 murmur in LUSB radiating to carotids.  Pulmonary/Chest: Effort normal and breath sounds normal. No respiratory distress. She has no wheezes. She has no rales.  Abdominal: Soft. Bowel sounds are normal. She exhibits no distension. There is no tenderness. There is no rebound and no guarding.  Musculoskeletal: She exhibits no edema and no tenderness.  Neurological: She is alert and oriented to person, place, and time.  Psychiatric: She has a normal mood and affect. Her behavior is normal.    Lab Results: Basic Metabolic Panel:  Recent Labs Lab 11/02/13 0921  11/05/13 1300 11/05/13 2000 11/06/13 0117 11/06/13 0247  NA 132*  --  126* 141 132* 135*  K 4.0  --  4.0 <2.2* 4.1 4.0  CL 94*  --  87* 112 93* 94*  CO2 24  --  24 18* 24 26  GLUCOSE 159*  --  192* 87 137* 138*  BUN 26*  --  46* 12 21 20   CREATININE 4.38*  --  5.13* 1.67* 3.06* 3.01*  CALCIUM 9.0  --  8.9 5.2* 9.0 10.1  MG  --   < >  --  1.1*  --  1.8  PHOS 3.7  --  4.0  --   --    --   < > = values in this interval not displayed. Liver Function Tests:  Recent Labs Lab 11/02/13 0921 11/05/13 1300  ALBUMIN 2.6* 2.4*   CBC:  Recent Labs Lab 11/05/13 1300 11/07/13 1725  WBC 7.7 10.8*  NEUTROABS  --  7.5  HGB 7.9* 6.6*  HCT 23.0* 19.8*  MCV 89.5 91.2  PLT 345 298   CBG:  Recent Labs Lab 11/06/13 2139 11/07/13 0929 11/07/13 1327 11/07/13 1714 11/07/13 2155 11/07/13 2213  GLUCAP 149* 146* 111* 104* 140* 137*    Micro Results: Recent Results (from the past 240 hour(s))  MRSA PCR SCREENING     Status: None   Collection Time    10/29/13  1:59 PM      Result Value Ref Range Status   MRSA by PCR NEGATIVE  NEGATIVE Final   Comment:            The GeneXpert MRSA Assay (FDA     approved for NASAL specimens     only), is one component of a     comprehensive MRSA colonization  surveillance program. It is not     intended to diagnose MRSA     infection nor to guide or     monitor treatment for     MRSA infections.  CLOSTRIDIUM DIFFICILE BY PCR     Status: Abnormal   Collection Time    10/30/13  2:55 PM      Result Value Ref Range Status   C difficile by pcr POSITIVE (*) NEGATIVE Final   Comment: CRITICAL RESULT CALLED TO, READ BACK BY AND VERIFIED WITH:     STANFIELD RN 17:20 10/30/13 ([email or web address])  MRSA PCR SCREENING     Status: None   Collection Time    11/05/13  9:16 PM      Result Value Ref Range Status   MRSA by PCR NEGATIVE  NEGATIVE Final   Comment:            The GeneXpert MRSA Assay (FDA     approved for NASAL specimens     only), is one component of a     comprehensive MRSA colonization     surveillance program. It is not     intended to diagnose MRSA     infection nor to guide or     monitor treatment for     MRSA infections.   Medications: I have reviewed the patient's current medications. Scheduled Meds: . amiodarone  200 mg Oral Daily  . aspirin  81 mg Oral Daily  . atorvastatin  40 mg Oral q1800  . clopidogrel  75 mg  Oral Q breakfast  . darbepoetin (ARANESP) injection - DIALYSIS  60 mcg Intravenous Q Wed-HD  . doxercalciferol  2 mcg Intravenous Q M,W,F-HD  . feeding supplement (RESOURCE BREEZE)  1 Container Oral TID BM  . heparin  5,000 Units Subcutaneous 3 times per day  . insulin aspart  0-9 Units Subcutaneous TID WC  . sodium chloride  3 mL Intravenous Q12H  . vancomycin  125 mg Oral QID   Continuous Infusions: . phenylephrine (NEO-SYNEPHRINE) Adult infusion Stopped (11/05/13 2315)   PRN Meds:.sodium chloride, sodium chloride, acetaminophen, acetaminophen, feeding supplement (NEPRO CARB STEADY), heparin, lidocaine (PF), lidocaine-prilocaine, loratadine, pentafluoroprop-tetrafluoroeth Assessment/Plan: Principal Problem:   Clostridium difficile colitis Active Problems:   End stage renal disease   DM (diabetes mellitus)   HTN (hypertension)   HLD (hyperlipidemia)   COPD (chronic obstructive pulmonary disease)   Aortic stenosis, moderate at cath 07/11/13   PAF (paroxysmal atrial fibrillation)   CAD- CABG X 3 09/2009, HSRA/DES to native RCA 07/11/13   Smoker   Atrial fibrillation   Chronic right-sided heart failure   Cardiac arrest   Sinus bradycardia   Aortic stenosis  Multifactorial Heart and Kidney Disease The patient has severe heart disease that is complicated by her ESRD. She appears to have trouble tolerating HD, with recent episodes of PEA arrest and SVT complicated by hypotension both occurring during HD. Given her severe AS, CAD, and multiple comorbidities, the patient was evaluated for interventions than may benefit her in terms of quality of life or longevity. The multidisciplinary valve team thoughtfully evaluated the patient for open AS repair combined with or without redo CABG as well as TAVR. It was felt that her comorbidities preclude her for open intervention. The risk of mortality as a results of TAVR was estimated at 50%. Thus, TAVR was determined to be not indicated for this  patient. The TAVR team has recommended a palliative care consult to establish goals of care given her poor prognosis. -  Spoke with patient and her son [name]. Will have a family meeting laterto discuss code status and prognosis.  C. Diff Colitis. The patient has diarrhea in setting of C Diff Ag Positivity c/b black stool and dropping Hg. The patients diarrhea is much imrpoved. Number of BMs decreased from 16 to 3. I suspect that the patients episode of hypotension could have predisposed her to ischemic colitis. Another likely possibility is a waxing and waning of her C Diff. The patient does not report abdominal pain at this time.  Lactic Acid is WNL. Patients Hg dropped to 6.6. She received 1 U PRBCs which increased her Hg to 8.7. The patient may have a intermittent GI bleed. She has a history of bleeding internal and external hemorrhoids. The bleed could be from her hemorrhoids, diverticula or simply due to colitis. As she is currently hemodynamically stable and she has very high cardiac risk if she were to undergo invasive procedures, I do not recommend a GI consult at this time as she is not currently a candidate for endoscopy or colonoscopy. - Trend CBC q 12 hr - Transfuse for Hg less than 8. - continue po vancomycin 171m 4 times daily  - Continue ASA and plavix given patients recent NSTEMI and known severe CAD  NSTEMI The patient had elevated troponin after becoming hypotensive on 3/9. Troponin peaked at 1.05 and down trended to .73 and .69. This is likely due to the patients known CAD. She has had multiple NSTEMI in the past. - Appreciate cardiology rec's - Continue ASA and plavix  Hypotension- Resolved The etiology of the patients hypotension was likely multifactorial. Key aspects include SVT and medication side effect (IV metoprolol). It remains imperative that the patients afterload be maintained as the patient has severe AS. Overnight, the patients BP dropped along with Hg. There is  concern of GI bleet given dark stools. However, FOBT was negative. ON patient received 250 cc bolus of NS as well as 1 U of PRBCs. Repeat CBC showed Hg 8.7 - SDU - 250 cc bolus as needed to maintain MAP greater than 60  SVT- Resolved The patient has a recent history of Afib with RVR. Thus this may be the etiology of the patients recent episode of SVT. Another cause of the patients SVT is increased demand during HD leading to reflex tachycardia as the patient had a recent cardiac arrest during recent HD which was felt to be a result of hypovolemia and hypotension. The patient did not tolerate IV metoprolol, will plan to avoid this approach in the future. However, the patients SVT may be difficult to treat given her complicated cardiac history. Please see cardiology note for further discussion. - Will consider amio drip per cardiology if SVT returns.  Severe Aortic Stenosis  It appears that the TAVR team does not believe that the patient would benefit from open AS repair with or without redo CABG. Further, the note states that TAVR is unlikely to provide significant improved QOL or length of life. Yesterday, Dr. CBurt Knackmet with patient and her family. The decision was made to not pursue TAVR as the expected mortality rate from TAVR is 50%.  - monitor volume status  - maintain MAP >60 with 250 cc fluid boluses.   ESRD - on MWF HD.  - HD per nephrology   Dispo: Disposition is deferred at this time, awaiting improvement of current medical problems.  Anticipated discharge in approximately 1-2 day(s).   The patient does have a current PCP (Imran  Gertie Baron, MD) and does need an [facility] hospital follow-up appointment after discharge.  The patient does have transportation limitations that hinder transportation to clinic appointments.  .Services Needed at time of discharge: Y = Yes, Blank = No PT:   OT:   RN:   Equipment:   Other:     LOS: 10 days   Marrion Coy, MD 11/08/2013, 7:23 AM

## 2013-11-08 NOTE — Progress Notes (Signed)
Subjective: Pt with borderline BP and hbg 6.6 yesterday after dialysis; pt transfused 1u PRBCs and bolused 250cc NS, with normalization of BP.  The patient reported feeling cold after dialysis, but was otherwise asymptomatic.  Nursing staff reports 3-4 loose, foul-smelling dark to maroon stools, without bright red blood.  Pt denies abdominal pain.  Objective: Vital signs in last 24 hours: Temp:  [98 F (36.7 C)-98.8 F (37.1 C)] 98.3 F (36.8 C) (03/12 1110) Pulse Rate:  [67-104] 67 (03/12 1110) Resp:  [15-34] 21 (03/12 1110) BP: (78-137)/(26-68) 127/49 mmHg (03/12 1110) SpO2:  [94 %-100 %] 100 % (03/12 1110) Weight:  [53.2 kg (117 lb 4.6 oz)] 53.2 kg (117 lb 4.6 oz) (03/12 0500) Weight change: -0.9 kg (-1 lb 15.8 oz) 03/11 0701 - 03/12 0700 In: 550 [P.O.:300; I.V.:250] Out: 1569   Intake/Output Summary (Last 24 hours) at 11/08/13 1332 Last data filed at 11/08/13 0900  Gross per 24 hour  Intake    550 ml  Output   1569 ml  Net  -1019 ml    Physical Exam General: On side of bed in no acute distress. Eyes: EOMI.  Conjunctivae normal, sclerae clear and anicteric. HENT: Oropharyngeal mucous membranes dry appearing Pulm: Normal respiratory rate and effort.  CTAB, no wheezes or crackles. Cardio: RRR, S1 and S2 with harsh holosystolic murmur, unchanged. Abdomen: Normoactive bowel sounds. Soft, non-tender, non-distended. Extremities: Warm and well perfused, with no edema or cyanosis.  Bandage placed over right lower extremity lesion. Neuro: Alert, oriented, and appropriate.  CN II-XII grossly intact.  Lab Results: Basic Metabolic Panel:  Recent Labs Lab 11/02/13 0921  11/05/13 1300 11/05/13 2000  11/06/13 0247 11/08/13 0716  NA 132*  --  126* 141  < > 135* 136*  K 4.0  --  4.0 <2.2*  < > 4.0 3.9  CL 94*  --  87* 112  < > 94* 94*  CO2 24  --  24 18*  < > 26 24  GLUCOSE 159*  --  192* 87  < > 138* 134*  BUN 26*  --  46* 12  < > 20 27*  CREATININE 4.38*  --  5.13* 1.67*  <  > 3.01* 2.32*  CALCIUM 9.0  --  8.9 5.2*  < > 10.1 9.2  MG  --   < >  --  1.1*  --  1.8  --   PHOS 3.7  --  4.0  --   --   --   --   < > = values in this interval not displayed. Liver Function Tests:  Recent Labs Lab 11/02/13 0921 11/05/13 1300  ALBUMIN 2.6* 2.4*   CBC:  Recent Labs Lab 11/07/13 1725 11/08/13 0716  WBC 10.8* 14.5*  NEUTROABS 7.5  --   HGB 6.6* 8.6*  HCT 19.8* 25.3*  MCV 91.2 90.4  PLT 298 269   Cardiac Enzymes:  Recent Labs Lab 11/06/13 1013 11/06/13 1515 11/06/13 2132  TROPONINI 1.05* 0.73* 0.69*   CBG:  Recent Labs Lab 11/07/13 1327 11/07/13 1714 11/07/13 2155 11/07/13 2213 11/08/13 0802 11/08/13 1110  GLUCAP 111* 104* 140* 137* 126* 133*   Misc. Labs:  None  Studies/Results:  No new studies  Medications: I have reviewed the patient's current medications. Scheduled Meds: . amiodarone  200 mg Oral Daily  . aspirin  81 mg Oral Daily  . atorvastatin  40 mg Oral q1800  . clopidogrel  75 mg Oral Q breakfast  . darbepoetin (ARANESP) injection -  DIALYSIS  60 mcg Intravenous Q Wed-HD  . doxercalciferol  2 mcg Intravenous Q M,W,F-HD  . feeding supplement (RESOURCE BREEZE)  1 Container Oral TID BM  . heparin  5,000 Units Subcutaneous 3 times per day  . insulin aspart  0-9 Units Subcutaneous TID WC  . magnesium sulfate 1 - 4 g bolus IVPB  2 g Intravenous Once  . sodium chloride  3 mL Intravenous Q12H  . vancomycin  125 mg Oral QID   Continuous Infusions: . phenylephrine (NEO-SYNEPHRINE) Adult infusion Stopped (11/05/13 2315)   PRN Meds:.sodium chloride, sodium chloride, acetaminophen, acetaminophen, feeding supplement (NEPRO CARB STEADY), heparin, lidocaine (PF), lidocaine-prilocaine, loratadine, pentafluoroprop-tetrafluoroeth Assessment/Plan: Ms. Balster is a 73 year old dialysis patient with multiple cardiac conditions including CAD, aortic stenosis, and PAF who presented on 10/29/13 after receiving 10 minutes of CPR with ROSC after an  apparent cardiac arrest during her dialysis session.  She tolerated inpatient HD well on 3/4 and 3/6, but had an additional cardiac event on 3/9.  Further HD likely poses risk of additional cardiac events.  She has also tested positive for C diff colitis, initially resolved on vanc but now with recurrence of diarrhea including bloody stools.  # Diarrhea with hematochezia; C diff ag positive on 3/3 - Vancomycin day 6; diarrhea initially seemed to resolve after 72hr on vanc; recurred in setting of cardiac event with hypotension.  Most likely refractory C diff colitis, supported by increased WBC; setting raises concern for mesenteric ischemia, but pt's abdominal exam is benign.  Report of dark stool with blood raises concern for GI bleed vs hemorrhoids.  Curiously, FOBT yesterday negative; likely spurious - continue po vancomycin 11m 4 times daily for 10 - 14 days  - continue ASA and plavix in setting of NSTEMI - defer consult for endoscopy as pt is hemodynamically stable - consider escalating C diff antibiotics if not improving  # [facility] Tolerated inpatient HD well on 3/4 and 3/6, but experienced SVTs during HD yesterday  -HD today -discussed code status with patient and family today given tenuous status in ; patient wishes not to be intubated or to have chest compressions, but would like to be medically or electrically cardioverted if necessary  # Hypotension - RESOLVED- No further episodes of hypotension since HD on 3/9.  Tolerating HD well today. - continue to monitor - hold off on metoprolol in future  # Narrow complex arrhythmia - RESOLVED- developed in the setting of HD on 3/9.  P waves indiscernible in setting of tachycardia and pre-existing bundle blocks, EKGs read as SVT but cardiology noted irregularly irregular rhythm concerning for Afib in pt with known PAF - will hold metoprolol in future - if SVT vs Afib develops in future, consider cardioversion  with IV amio - appreciate cardiology's involvement  # PEA arrest - RESOLVED - PEA arrest in the setting of severe AS, dialysis, and possibly volume depletion from C difficile diarrhea.  AS likely a significant contributing factor; patient not a candidate for open valve replacement.  Pt and family met with cardiology today; opted not to proceed with TAVR given high mortality risk from procedure and unclear clinical benefit.  -on tele  -hold metoprolol  -continue amiodarone 2022mdaily  -continue to monitor patient's blood pressure, and maintain preload with cautious IVF boluses as needed   # Severe Aortic Stenosis - Will not proceed with TAVR as above.   # Bright red blood per rectum - Intermittent  blood and rectal pain with defecation for the last 3 months per pt report. Colonoscopy with Dr. Lyda Jester in 2009 showed external hemorroids, removed hyperplastic polyps with recommended 10 year follow up. This most likely represents hemorrhoidal bleeding (multiple hemorrhoids present on DRE) in the setting of increased stool frequency, vs diverticulosis. Hb currently stable; increased 8.5 -> 9.3.  -no need for GI consult at this time   # Paroxysmal Afib - Patient has been in sinus bradycardiac or NSR this admission.  -hold metoprolol, continued amiodarone at 273m daily as above  -not a candidate for anticoagulation or EP procedures  # QT prolongation - seen on prior EKG's, though mildly higher on admission compared to prior (QTc = 549 from 504).  -continue telemetry  -patient on anti-arrhythmic therapy, not candidate for EP procedure   # CAD - s/p CABG x3 in 2011 (LIMA to LAD, vein to circumflex, vein to PDA), s/p cath Nov 2014 (DES to RCA).  -continue aspirin, plavix, statin   # DM2 - On home Novolog 70/30 on a sliding scale, pt unclear on dosing. Per dietician, pt has not been taking insulin at home or checking blood glucose. Patient's A1c is 8.5. Patient has received 9-10 units of insulin  total per day over the weekend, with CBGs ranging 156-216  -continue sensitive SSI  -start pt on novolog 5 units with breakfast / 8 units with dinner  Dispo: PT recommendation on 3/5 and 3/8 that patient be discharged with home health PT with 24 hr supervision/assistance; will ask to re-evaluate patient given concerns about ability to self-care at home.  Potential discharge early next week.  This is a MCareers information officerNote.  The care of the patient was discussed with Dr. KMargart Sicklesand the assessment and plan formulated with their assistance.  Please see their attached note for official documentation of the daily encounter.   LOS: 10 days   NCherlyn Labella Med Student 11/08/2013, 1:32 PM

## 2013-11-09 DIAGNOSIS — D62 Acute posthemorrhagic anemia: Secondary | ICD-10-CM

## 2013-11-09 DIAGNOSIS — I214 Non-ST elevation (NSTEMI) myocardial infarction: Secondary | ICD-10-CM

## 2013-11-09 LAB — AMIODARONE LEVEL
AMIODARONE LVL: 0.5 ug/mL — AB (ref 1.5–2.5)
N-Desethyl-Amiodarone: 0.5 ug/mL — ABNORMAL LOW (ref 1.5–2.5)

## 2013-11-09 LAB — CBC
HEMATOCRIT: 19.3 % — AB (ref 36.0–46.0)
HEMOGLOBIN: 6.6 g/dL — AB (ref 12.0–15.0)
MCH: 31 pg (ref 26.0–34.0)
MCHC: 34.2 g/dL (ref 30.0–36.0)
MCV: 90.6 fL (ref 78.0–100.0)
Platelets: 245 10*3/uL (ref 150–400)
RBC: 2.13 MIL/uL — ABNORMAL LOW (ref 3.87–5.11)
RDW: 16.2 % — AB (ref 11.5–15.5)
WBC: 12.9 10*3/uL — AB (ref 4.0–10.5)

## 2013-11-09 LAB — CBC WITH DIFFERENTIAL/PLATELET
Basophils Absolute: 0.1 K/uL (ref 0.0–0.1)
Basophils Relative: 1 % (ref 0–1)
Eosinophils Absolute: 0.4 K/uL (ref 0.0–0.7)
Eosinophils Relative: 3 % (ref 0–5)
HCT: 30.7 % — ABNORMAL LOW (ref 36.0–46.0)
Hemoglobin: 10.7 g/dL — ABNORMAL LOW (ref 12.0–15.0)
Lymphocytes Relative: 10 % — ABNORMAL LOW (ref 12–46)
Lymphs Abs: 1.1 K/uL (ref 0.7–4.0)
MCH: 31.8 pg (ref 26.0–34.0)
MCHC: 34.9 g/dL (ref 30.0–36.0)
MCV: 91.4 fL (ref 78.0–100.0)
Monocytes Absolute: 0.9 K/uL (ref 0.1–1.0)
Monocytes Relative: 8 % (ref 3–12)
Neutro Abs: 8.6 K/uL — ABNORMAL HIGH (ref 1.7–7.7)
Neutrophils Relative %: 78 % — ABNORMAL HIGH (ref 43–77)
Platelets: 211 K/uL (ref 150–400)
RBC: 3.36 MIL/uL — ABNORMAL LOW (ref 3.87–5.11)
RDW: 15.3 % (ref 11.5–15.5)
WBC: 11 K/uL — ABNORMAL HIGH (ref 4.0–10.5)

## 2013-11-09 LAB — RENAL FUNCTION PANEL
ALBUMIN: 2.9 g/dL — AB (ref 3.5–5.2)
BUN: 40 mg/dL — AB (ref 6–23)
CHLORIDE: 92 meq/L — AB (ref 96–112)
CO2: 24 mEq/L (ref 19–32)
Calcium: 9.2 mg/dL (ref 8.4–10.5)
Creatinine, Ser: 3.56 mg/dL — ABNORMAL HIGH (ref 0.50–1.10)
GFR calc Af Amer: 14 mL/min — ABNORMAL LOW (ref >90)
GFR calc non Af Amer: 12 mL/min — ABNORMAL LOW (ref >90)
GLUCOSE: 170 mg/dL — AB (ref 70–99)
Phosphorus: 4.1 mg/dL (ref 2.3–4.6)
Potassium: 3.8 mEq/L (ref 3.7–5.3)
Sodium: 133 mEq/L — ABNORMAL LOW (ref 137–147)

## 2013-11-09 LAB — GLUCOSE, CAPILLARY
Glucose-Capillary: 112 mg/dL — ABNORMAL HIGH (ref 70–99)
Glucose-Capillary: 295 mg/dL — ABNORMAL HIGH (ref 70–99)
Glucose-Capillary: 159 mg/dL — ABNORMAL HIGH (ref 70–99)

## 2013-11-09 LAB — PREPARE RBC (CROSSMATCH)

## 2013-11-09 MED ORDER — LIDOCAINE HCL (PF) 1 % IJ SOLN: 5.0000 mL | INTRAMUSCULAR | Status: DC | PRN

## 2013-11-09 MED ORDER — PANTOPRAZOLE SODIUM 40 MG IV SOLR
40.0000 mg | Freq: Two times a day (BID) | INTRAVENOUS | Status: DC
  Administered 2013-11-09 – 2013-11-12 (×6): 40 mg via INTRAVENOUS
  Filled 2013-11-09 (×7): qty 40

## 2013-11-09 MED ORDER — SODIUM CHLORIDE 0.9 % IV SOLN: 100.0000 mL | INTRAVENOUS | Status: DC | PRN

## 2013-11-09 MED ORDER — DARBEPOETIN ALFA-POLYSORBATE 150 MCG/0.3ML IJ SOLN: 150.0000 ug | INTRAMUSCULAR | Status: DC

## 2013-11-09 MED ORDER — NEPRO/CARBSTEADY PO LIQD: 237.0000 mL | ORAL | Status: DC | PRN

## 2013-11-09 MED ORDER — LIDOCAINE-PRILOCAINE 2.5-2.5 % EX CREA: 1.0000 "application " | TOPICAL_CREAM | CUTANEOUS | Status: DC | PRN

## 2013-11-09 MED ORDER — DOXERCALCIFEROL 4 MCG/2ML IV SOLN
INTRAVENOUS | Status: AC
  Administered 2013-11-09: 2 ug
  Filled 2013-11-09: qty 2

## 2013-11-09 MED ORDER — HEPARIN SODIUM (PORCINE) 1000 UNIT/ML DIALYSIS: 1000.0000 [IU] | INTRAMUSCULAR | Status: DC | PRN

## 2013-11-09 MED ORDER — ALTEPLASE 2 MG IJ SOLR
2.0000 mg | Freq: Once | INTRAMUSCULAR | Status: DC | PRN
  Filled 2013-11-09: qty 2

## 2013-11-09 MED ORDER — PENTAFLUOROPROP-TETRAFLUOROETH EX AERO: 1.0000 "application " | INHALATION_SPRAY | CUTANEOUS | Status: DC | PRN

## 2013-11-09 NOTE — Progress Notes (Signed)
Chaplain responded to request of pt for information about Advance Directive.  Chaplain went to pt's room, but pt indicated her son had left.  Chaplain left AD information for pt and pt's son to review.  11/09/13 1600  Clinical Encounter Type  Visited With Patient  Visit Type Spiritual support;Critical Care  Advance Directives (For Healthcare)  Advance Directive Patient would like information   Rulon Abideavid B Sherrod, chaplain pager [phone]

## 2013-11-09 NOTE — Progress Notes (Signed)
I have read and agree with med student note, see my note for additional comments.  Angelina Sheriffhris Alante Tolan, MD

## 2013-11-09 NOTE — Progress Notes (Signed)
Internal Medicine Attending  Date: 11/09/2013  Patient name: [name] A Wherley Medical record number: [id number] Date of birth: [birth year] Age: 73 y.o. Gender: female  I saw and evaluated the patient. I reviewed the resident's note by Dr. Glendell DockerKomanski and I agree with the resident's findings and plans as documented in his note.

## 2013-11-09 NOTE — Progress Notes (Signed)
CRITICAL VALUE ALERT  Critical value received: hgb6.6  Date of notification:  11/09/2013  Time of notification:  0823  Critical value read back:yes  Nurse who received alert:  Johnell ComingsAngelito Connell [name],RN  MD notified (1st page):  Ulyses Jarredavid Zeyfang,PA  Time of first page:  0823  MD notified (2nd page):  Time of second page:  Responding MD:  Ulyses Jarredavid Zeyfang,PA  Time MD responded:  [phone]

## 2013-11-09 NOTE — Progress Notes (Signed)
OT Cancellation Note  Patient Details Name: [name] MRN: [id number] DOB: 1941/07/14   Cancelled Treatment:    Reason Eval/Treat Not Completed: Patient at procedure or test/ unavailable (in HD).  11/09/2013 Cipriano MileJohnson, Jenna Elizabeth OTR/L Pager [phone] Office [phone]

## 2013-11-09 NOTE — Progress Notes (Signed)
PT Cancellation Note  Patient Details Name: Hadassah Paiseggy A Montroy MRN: [id number] DOB: 05-May-1941   Cancelled Treatment:    Reason Eval/Treat Not Completed: Patient at procedure or test/unavailable (in HD and unavailable)   Delorse Lekabor, Jakwan Sally Beth 11/09/2013, 7:22 AM Delaney MeigsMaija Tabor Gerrica Cygan, PT [phone]

## 2013-11-09 NOTE — Progress Notes (Signed)
Subjective:  Hg dropped to 6.6. Transfused 2 U in HD. Tolerated HD w/o problems. Diarrhea much improved.  Objective: Vital signs in last 24 hours: Filed Vitals:   11/08/13 2000 11/09/13 0000 11/09/13 0300 11/09/13 0400  BP: 114/36 100/53  120/45  Pulse: 68 65 52 64  Temp: 97.7 F (36.5 C) 98.4 F (36.9 C) 97.4 F (36.3 C)   TempSrc: Oral Oral Oral   Resp: 21 22  19   Height:      Weight:   119 lb 7.8 oz (54.2 kg)   SpO2: 95% 95% 96%    Weight change: -2 lb 3.3 oz (-1 kg)  Intake/Output Summary (Last 24 hours) at 11/09/13 7670 Last data filed at 11/08/13 2000  Gross per 24 hour  Intake    440 ml  Output      0 ml  Net    440 ml   Physical Exam  Constitutional: She is oriented to person, place, and time. She appears well-developed and well-nourished.  HENT:  Head: Normocephalic and atraumatic.  Cardiovascular: Normal rate, regular rhythm and intact distal pulses.   Murmur heard. crescendo decresendo 4/6 murmur in LUSB radiating to carotids.  Pulmonary/Chest: Effort normal and breath sounds normal. No respiratory distress. She has no wheezes. She has no rales.  Abdominal: Soft. Bowel sounds are normal. She exhibits no distension. There is no tenderness. There is no rebound and no guarding.  Musculoskeletal: She exhibits no edema and no tenderness.  Neurological: She is alert and oriented to person, place, and time.  Psychiatric: She has a normal mood and affect. Her behavior is normal.    Lab Results: Basic Metabolic Panel:  Recent Labs Lab 11/02/13 0921  11/05/13 1300 11/05/13 2000  11/06/13 0247 11/08/13 0716  NA 132*  --  126* 141  < > 135* 136*  K 4.0  --  4.0 <2.2*  < > 4.0 3.9  CL 94*  --  87* 112  < > 94* 94*  CO2 24  --  24 18*  < > 26 24  GLUCOSE 159*  --  192* 87  < > 138* 134*  BUN 26*  --  46* 12  < > 20 27*  CREATININE 4.38*  --  5.13* 1.67*  < > 3.01* 2.32*  CALCIUM 9.0  --  8.9 5.2*  < > 10.1 9.2  MG  --   < >  --  1.1*  --  1.8  --   PHOS  3.7  --  4.0  --   --   --   --   < > = values in this interval not displayed. Liver Function Tests:  Recent Labs Lab 11/02/13 0921 11/05/13 1300  ALBUMIN 2.6* 2.4*   CBC:  Recent Labs Lab 11/07/13 1725  11/08/13 1835 11/09/13 0743  WBC 10.8*  < > 11.5* 12.9*  NEUTROABS 7.5  --  8.2*  --   HGB 6.6*  < > 8.1* 6.6*  HCT 19.8*  < > 24.2* 19.3*  MCV 91.2  < > 90.6 90.6  PLT 298  < > 269 245  < > = values in this interval not displayed. CBG:  Recent Labs Lab 11/07/13 2155 11/07/13 2213 11/08/13 0802 11/08/13 1110 11/08/13 1644 11/08/13 2117  GLUCAP 140* 137* 126* 133* 202* 134*    Micro Results: Recent Results (from the past 240 hour(s))  CLOSTRIDIUM DIFFICILE BY PCR     Status: Abnormal   Collection Time    10/30/13  2:55 PM      Result Value Ref Range Status   C difficile by pcr POSITIVE (*) NEGATIVE Final   Comment: CRITICAL RESULT CALLED TO, READ BACK BY AND VERIFIED WITH:     STANFIELD RN 17:20 10/30/13 ([email or web address])  MRSA PCR SCREENING     Status: None   Collection Time    11/05/13  9:16 PM      Result Value Ref Range Status   MRSA by PCR NEGATIVE  NEGATIVE Final   Comment:            The GeneXpert MRSA Assay (FDA     approved for NASAL specimens     only), is one component of a     comprehensive MRSA colonization     surveillance program. It is not     intended to diagnose MRSA     infection nor to guide or     monitor treatment for     MRSA infections.   Medications: I have reviewed the patient's current medications. Scheduled Meds: . amiodarone  200 mg Oral Daily  . aspirin  81 mg Oral Daily  . atorvastatin  40 mg Oral q1800  . clopidogrel  75 mg Oral Q breakfast  . darbepoetin (ARANESP) injection - DIALYSIS  60 mcg Intravenous Q Wed-HD  . doxercalciferol  2 mcg Intravenous Q M,W,F-HD  . feeding supplement (RESOURCE BREEZE)  1 Container Oral TID BM  . heparin  5,000 Units Subcutaneous 3 times per day  . insulin aspart  0-9 Units Subcutaneous TID  WC  . sodium chloride  3 mL Intravenous Q12H  . vancomycin  125 mg Oral QID   Continuous Infusions: . phenylephrine (NEO-SYNEPHRINE) Adult infusion Stopped (11/05/13 2315)   PRN Meds:.sodium chloride, sodium chloride, acetaminophen, acetaminophen, feeding supplement (NEPRO CARB STEADY), heparin, lidocaine (PF), lidocaine-prilocaine, loratadine, pentafluoroprop-tetrafluoroeth Assessment/Plan: Principal Problem:   Clostridium difficile colitis Active Problems:   End stage renal disease   DM (diabetes mellitus)   HTN (hypertension)   HLD (hyperlipidemia)   COPD (chronic obstructive pulmonary disease)   Aortic stenosis, moderate at cath 07/11/13   PAF (paroxysmal atrial fibrillation)   CAD- CABG X 3 09/2009, HSRA/DES to native RCA 07/11/13   Smoker   Atrial fibrillation   Chronic right-sided heart failure   Cardiac arrest   Sinus bradycardia   Aortic stenosis  Multifactorial Heart and Kidney Disease The patient has severe heart disease that is complicated by her ESRD. She appears to have trouble tolerating HD, with recent episodes of PEA arrest and SVT complicated by hypotension both occurring during HD. Given her severe AS, CAD, and multiple comorbidities, the patient was evaluated for interventions than may benefit her in terms of quality of life or longevity. The multidisciplinary valve team thoughtfully evaluated the patient for open AS repair combined with or without redo CABG as well as TAVR. It was felt that her comorbidities preclude her for open intervention. The risk of mortality as a results of TAVR was estimated at 50%. Thus, TAVR was determined to be not indicated for this patient. The TAVR team has recommended a palliative care consult to establish goals of care given her poor prognosis. On 3/12, I had a meeting with the patient and her son and brother. We discussed her code status. The patient elected to have a limited code (pressors and shocks OK, no CPR and no  intubation).  C. Diff Colitis c/b acute blood loss anemia The patient has diarrhea in setting of C Diff Ag  positivity c/b black stool and dropping Hg. The patients diarrhea is much improved and appears to be resolving. The patients colitis is likely due to C. Diff but could also have resulted after her episodes of hypotension. The bleeding is likely a result of this colitis. - - Trend CBC q 12 hr - Transfuse for Hg less than 8. - continue po vancomycin 187m 4 times daily  - Continue ASA and plavix given patients recent NSTEMI and known severe CAD - d/c heparin  NSTEMI The patient had elevated troponin after becoming hypotensive on 3/9. Troponin peaked at 1.05 and down trended to .73 and .69. This is likely due to the patients known CAD. She has had multiple NSTEMI in the past. - Appreciate cardiology rec's - Continue ASA and plavix  Severe Aortic Stenosis  It appears that the TAVR team does not believe that the patient would benefit from open AS repair with or without redo CABG. Further, the note states that TAVR is unlikely to provide significant improved QOL or length of life. Yesterday, Dr. CBurt Knackmet with patient and her family. The decision was made to not pursue TAVR as the expected mortality rate from TAVR is 50%.  - monitor volume status  - maintain MAP >60 with 250 cc fluid boluses.   ESRD - on MWF HD.  - HD per nephrology   Dispo: Disposition is deferred at this time, awaiting improvement of current medical problems.  Anticipated discharge in approximately 1-2 day(s).   The patient does have a current PCP (Imran PGertie Baron MD) and [facility].  The patient does have transportation limitations that hinder transportation to clinic appointments.  .Services Needed at time of discharge: Y = Yes, Blank = No PT:   OT:   RN:   Equipment:   Other:     LOS: 11 days   CMarrion Coy MD 11/09/2013, 7:27 AM

## 2013-11-09 NOTE — Progress Notes (Signed)
Subjective: Hbg 6.6 -> 8.6 after 1u PRBCs, then -> 8.1 -> 6.6 (CBCs q12h).  Patient transfused 2u PRBCs in dialysis this am.  The patient reported feeling short of breath this morning, but was otherwise asymptomatic.  4 stools recorded overnight.  Pt denies abdominal pain.  Objective: Vital signs in last 24 hours: Temp:  [97.4 F (36.3 C)-98.9 F (37.2 C)] 97.8 F (36.6 C) (03/13 1110) Pulse Rate:  [52-75] 64 (03/13 1110) Resp:  [19-26] 20 (03/13 1110) BP: (100-165)/(31-78) 139/66 mmHg (03/13 1110) SpO2:  [90 %-100 %] 100 % (03/13 1110) Weight:  [52.8 kg (116 lb 6.5 oz)-54.7 kg (120 lb 9.5 oz)] 52.8 kg (116 lb 6.5 oz) (03/13 1110) Weight change: -1 kg (-2 lb 3.3 oz) 03/12 0701 - 03/13 0700 In: 440 [P.O.:390; IV Piggyback:50] Out: -   Intake/Output Summary (Last 24 hours) at 11/09/13 1154 Last data filed at 11/09/13 1110  Gross per 24 hour  Intake    881 ml  Output   1920 ml  Net  -1039 ml    Physical Exam General: In HD, no acute distress. Eyes: EOMI.  Conjunctivae normal, sclerae clear and anicteric. HENT: Oropharyngeal mucous membranes dry appearing Pulm: Normal respiratory rate and effort.  CTA anteriorly. Cardio: RRR, S1 and S2 with harsh holosystolic murmur, unchanged. Abdomen: Normoactive bowel sounds. Soft, non-tender, non-distended. Extremities: Warm and well perfused, with no edema or cyanosis.  Bandage placed over right lower extremity lesion. Neuro: Alert, oriented, and appropriate.  CN II-XII grossly intact.  Lab Results: Basic Metabolic Panel:  Recent Labs Lab 11/05/13 1300 11/05/13 2000  11/06/13 0247 11/08/13 0716 11/09/13 0743  NA 126* 141  < > 135* 136* 133*  K 4.0 <2.2*  < > 4.0 3.9 3.8  CL 87* 112  < > 94* 94* 92*  CO2 24 18*  < > 26 24 24   GLUCOSE 192* 87  < > 138* 134* 170*  BUN 46* 12  < > 20 27* 40*  CREATININE 5.13* 1.67*  < > 3.01* 2.32* 3.56*  CALCIUM 8.9 5.2*  < > 10.1 9.2 9.2  MG  --  1.1*  --  1.8  --   --   PHOS 4.0  --   --    --   --  4.1  < > = values in this interval not displayed. Liver Function Tests:  Recent Labs Lab 11/05/13 1300 11/09/13 0743  ALBUMIN 2.4* 2.9*   CBC:  Recent Labs Lab 11/07/13 1725  11/08/13 1835 11/09/13 0743  WBC 10.8*  < > 11.5* 12.9*  NEUTROABS 7.5  --  8.2*  --   HGB 6.6*  < > 8.1* 6.6*  HCT 19.8*  < > 24.2* 19.3*  MCV 91.2  < > 90.6 90.6  PLT 298  < > 269 245  < > = values in this interval not displayed. Cardiac Enzymes:  Recent Labs Lab 11/06/13 1013 11/06/13 1515 11/06/13 2132  TROPONINI 1.05* 0.73* 0.69*   CBG:  Recent Labs Lab 11/07/13 2155 11/07/13 2213 11/08/13 0802 11/08/13 1110 11/08/13 1644 11/08/13 2117  GLUCAP 140* 137* 126* 133* 202* 134*   Misc. Labs:  None  Studies/Results:  No new studies  Medications: I have reviewed the patient's current medications. Scheduled Meds: . amiodarone  200 mg Oral Daily  . aspirin  81 mg Oral Daily  . atorvastatin  40 mg Oral q1800  . clopidogrel  75 mg Oral Q breakfast  . [START ON 11/14/2013] darbepoetin (ARANESP) injection -  DIALYSIS  150 mcg Intravenous Q Wed-HD  . doxercalciferol  2 mcg Intravenous Q M,W,F-HD  . feeding supplement (RESOURCE BREEZE)  1 Container Oral TID BM  . heparin  5,000 Units Subcutaneous 3 times per day  . insulin aspart  0-9 Units Subcutaneous TID WC  . sodium chloride  3 mL Intravenous Q12H  . vancomycin  125 mg Oral QID   Continuous Infusions: . phenylephrine (NEO-SYNEPHRINE) Adult infusion Stopped (11/05/13 2315)   PRN Meds:.sodium chloride, sodium chloride, acetaminophen, acetaminophen, alteplase, feeding supplement (NEPRO CARB STEADY), heparin, lidocaine (PF), lidocaine-prilocaine, loratadine, pentafluoroprop-tetrafluoroeth Assessment/Plan: Ms. Cobern is a 73 year old dialysis patient with multiple cardiac conditions including CAD, aortic stenosis, and PAF who presented on 10/29/13 after receiving 10 minutes of CPR with ROSC after an apparent cardiac arrest during  her dialysis session.  She tolerated inpatient HD well on 3/4 and 3/6, but had an additional cardiac event on 3/9.  Further HD likely poses risk of additional cardiac events.  She has also tested positive for C diff colitis, initially resolved on vanc but now with recurrence of diarrhea including bloody stools.  # Blood per rectum--hematochezia in the setting of C diff ag positivity, likely 2/2 C diff colitis vs hemorrhoids vs diverticuli vs AVM vs upper GI bleed.  Patient has required a total of 3 units PRBCs over 48 hours, but is hemodynamically stable.  BRBPR is not new per patient report.  Colonoscopy with Dr. Lyda Jester in 2009 showed external hemorroids, removed hyperplastic polyps with recommended 10 year follow up.  Patient likely not a candidate for GI procedures presently. -will continue ASA and plavix in setting of NSTEMI 5 days ago -discontinue heparin ppx; switch to SCDs -start PPI -trend CBCs BID  # Diarrhea, presumed C diff colitis--hematochezia as above; C diff ag positive on 3/3; on po vanc.  Diarrhea initially seemed to resolve after 72hr on vanc, but recurred in setting of cardiac event with hypotension.  Most likely refractory C diff colitis, supported by increased WBC; setting raises concern for mesenteric ischemia, but pt's abdominal exam is benign. - continue po vancomycin 136m 4 times daily - consider escalating C diff antibiotics if not improving  # [facility] Tolerated inpatient HD well on 3/4 and 3/6, but experienced SVTs during HD yesterday.  Discussed code status with patient and family given tenuous status in HD; patient wishes not to be intubated or to have chest compressions, but would like to be medically or electrically cardioverted if necessary.  Partial code order entered. -HD today  # Hypotension - RESOLVED- No further episodes of hypotension since HD on 3/9.  Tolerating HD well today. - continue to monitor - hold off on  metoprolol in future  # Narrow complex arrhythmia - RESOLVED- developed in the setting of HD on 3/9.  P waves indiscernible in setting of tachycardia and pre-existing bundle blocks, EKGs read as SVT but cardiology noted irregularly irregular rhythm concerning for Afib in pt with known PAF - will hold metoprolol in future - if SVT vs Afib develops in future, consider cardioversion with IV amio - appreciate cardiology's involvement  # PEA arrest - RESOLVED - PEA arrest in the setting of severe AS, dialysis, and possibly volume depletion from C difficile diarrhea.  AS likely a significant contributing factor; patient not a candidate for open valve replacement.  Pt and family met with cardiology today; opted not to proceed with TAVR given high mortality risk from procedure and  unclear clinical benefit.  -on tele -hold metoprolol  -continue amiodarone 28m daily  -continue to monitor patient's blood pressure, and maintain preload with cautious IVF boluses as needed   # Severe Aortic Stenosis - Will not proceed with TAVR as above.   # Paroxysmal Afib - Patient has been in sinus bradycardiac or NSR this admission.  -hold metoprolol, continued amiodarone at 2055mdaily as above -not a candidate for anticoagulation or EP procedures  # QT prolongation - seen on prior EKG's, though mildly higher on admission compared to prior (QTc = 549 from 504).  -continue telemetry  -patient on anti-arrhythmic therapy, not candidate for EP procedure   # CAD - s/p CABG x3 in 2011 (LIMA to LAD, vein to circumflex, vein to PDA), s/p cath Nov 2014 (DES to RCA).  -continue aspirin, plavix, statin   # DM2 - On home Novolog 70/30 on a sliding scale, pt unclear on dosing. Per dietician, pt has not been taking insulin at home or checking blood glucose. Patient's A1c is 8.5. Patient has received 9-10 units of insulin total per day over the weekend, with CBGs ranging 156-216  -continue sensitive SSI  -start pt on novolog 5  units with breakfast / 8 units with dinner  Dispo: PT recommendation on 3/5 and 3/8 that patient be discharged with home health PT with 24 hr supervision/assistance; will ask to re-evaluate patient given concerns about ability to self-care at home.  Dispo deferred pending resolution of GI bleeding problem.  Potential discharge early next week.  This is a [employer] information officerote.  The care of the patient was discussed with Dr. KoMargart Sicklesnd the assessment and plan formulated with their assistance.  Please see their attached note for official documentation of the daily encounter.   LOS: 11 days   NaCherlyn LabellaMed Student 11/09/2013, 11:54 AM

## 2013-11-09 NOTE — Progress Notes (Signed)
Subjective:  On hd /sleeping awoken no cos/ tells me diarrhea better and she is hungry Objective Vital signs in last 24 hours: Filed Vitals:   11/09/13 1010 11/09/13 1025 11/09/13 1030 11/09/13 1100  BP: 134/58 140/60 140/64 112/75  Pulse: 65 64 64 65  Temp: 97.8 F (36.6 C) 97.9 F (36.6 C)    TempSrc:  Oral    Resp:  20    Height:      Weight:      SpO2:      Exam:  On HD sleeping , awoken NAD chair No jvd  Rales Left  base only   RRR 3/6 SEM , no rub Abd soft, nt, nd  No LE pedal edema  LUA AV fistula patent on hd   Dialysis: MWF [place]  3.5h (^'d 4h here) F160 52kg 3K/2.25 Bath Prof 4 Heparin none  Hect 2 EPO 2200  Labwork: OP= 10.5/24%/1600 pth 433   Assessment:  1 Cardiac arrest  2 Severe AS- not a candidate for procedures per surgeons  3 Cdif infection- diarrhea better, on po vanc  4 AFib on amio  5 ESRD on hemodialysis  6 Anemia / GIB+CKD- darbe 60/wk, 1u prbc 3/11, Hb 8.6> 8.1> 6.6 will give 2 units PRBc"s on hd with her card issues / incr esa  7 DM  8 COPD  9 2HPT cont vit D, 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 Further wu per admit  Ernest Haber, PA-C [place] [phone] 11/09/2013,11:42 AM  LOS: 11 days   Pt seen, examined, agree w assess/plan as above with additions as indicated. Primary team met with pt yesterday and pt decided on limited code status, see their note.  HD today.  Kelly Splinter MD pager [phone]    cell [phone] 11/09/2013, 12:19 PM     Labs: Basic Metabolic Panel:  Recent Labs Lab 11/05/13 1300  11/06/13 0247 11/08/13 0716 11/09/13 0743  NA 126*  < > 135* 136* 133*  K 4.0  < > 4.0 3.9 3.8  CL 87*  < > 94* 94* 92*  CO2 24  < > 26 24 24   GLUCOSE 192*  < > 138* 134* 170*  BUN 46*  < > 20 27* 40*  CREATININE 5.13*  < > 3.01* 2.32* 3.56*  CALCIUM 8.9  < > 10.1 9.2 9.2  PHOS 4.0  --   --   --  4.1  < > = values in this interval not  displayed. Liver Function Tests:  Recent Labs Lab 11/05/13 1300 11/09/13 0743  ALBUMIN 2.4* 2.9*    Recent Labs Lab 11/05/13 1300 11/07/13 1725 11/08/13 0716 11/08/13 1835 11/09/13 0743  WBC 7.7 10.8* 14.5* 11.5* 12.9*  NEUTROABS  --  7.5  --  8.2*  --   HGB 7.9* 6.6* 8.6* 8.1* 6.6*  HCT 23.0* 19.8* 25.3* 24.2* 19.3*  MCV 89.5 91.2 90.4 90.6 90.6  PLT 345 298 269 269 245   Cardiac Enzymes:  Recent Labs Lab 11/06/13 1013 11/06/13 1515 11/06/13 2132  TROPONINI 1.05* 0.73* 0.69*   CBG:  Recent Labs Lab 11/07/13 2213 11/08/13 0802 11/08/13 1110 11/08/13 1644 11/08/13 2117  GLUCAP 137* 126* 133* 202* 134*

## 2013-11-09 NOTE — Procedures (Signed)
I was present at this dialysis session, have reviewed the session itself and made  appropriate changes  Vinson Moselleob Brandalynn Ofallon MD (pgr) [phone]    ([phone]) [phone] 11/09/2013, 9:56 AM

## 2013-11-09 NOTE — Progress Notes (Signed)
I have read and agree with the medical students note,  Chris Mickala Laton, MD 

## 2013-11-10 LAB — TYPE AND SCREEN
ABO/RH(D): O POS
Antibody Screen: NEGATIVE
Unit division: 0
Unit division: 0
Unit division: 0

## 2013-11-10 LAB — CBC WITH DIFFERENTIAL/PLATELET
BASOS ABS: 0 10*3/uL (ref 0.0–0.1)
BASOS PCT: 0 % (ref 0–1)
EOS ABS: 0.5 10*3/uL (ref 0.0–0.7)
Eosinophils Relative: 4 % (ref 0–5)
HCT: 32.3 % — ABNORMAL LOW (ref 36.0–46.0)
Hemoglobin: 10.9 g/dL — ABNORMAL LOW (ref 12.0–15.0)
Lymphocytes Relative: 9 % — ABNORMAL LOW (ref 12–46)
Lymphs Abs: 1.1 10*3/uL (ref 0.7–4.0)
MCH: 31.2 pg (ref 26.0–34.0)
MCHC: 33.7 g/dL (ref 30.0–36.0)
MCV: 92.6 fL (ref 78.0–100.0)
Monocytes Absolute: 1.2 10*3/uL — ABNORMAL HIGH (ref 0.1–1.0)
Monocytes Relative: 9 % (ref 3–12)
NEUTROS ABS: 9.9 10*3/uL — AB (ref 1.7–7.7)
NEUTROS PCT: 78 % — AB (ref 43–77)
PLATELETS: 211 10*3/uL (ref 150–400)
RBC: 3.49 MIL/uL — ABNORMAL LOW (ref 3.87–5.11)
RDW: 15.6 % — AB (ref 11.5–15.5)
WBC: 12.8 10*3/uL — ABNORMAL HIGH (ref 4.0–10.5)

## 2013-11-10 LAB — GLUCOSE, CAPILLARY
GLUCOSE-CAPILLARY: 200 mg/dL — AB (ref 70–99)
GLUCOSE-CAPILLARY: 102 mg/dL — AB (ref 70–99)
Glucose-Capillary: 139 mg/dL — ABNORMAL HIGH (ref 70–99)
Glucose-Capillary: 198 mg/dL — ABNORMAL HIGH (ref 70–99)

## 2013-11-10 NOTE — Progress Notes (Signed)
[facility] . Pt aware of pending tx to room 3W 15

## 2013-11-10 NOTE — Progress Notes (Signed)
  [facility] Progress Note   Subjective: Diarrhea better  Filed Vitals:   11/10/13 0749 11/10/13 0750 11/10/13 0957 11/10/13 1212  BP:  159/46  120/36  Pulse:      Temp: 96.3 F (35.7 C)   98.4 F (36.9 C)  TempSrc: Axillary   Oral  Resp: 22  23   Height:      Weight:      SpO2: 96%  96% 94%   Exam: Stable, up in chair, no distress No jvd  L clear, dec'd R base, stable RRR 3/6 SEM , no rub  Abd soft, nt, nd  No LE pedal edema  LUA AV fistula patent on hd   Dialysis: MWF [place]  3.5h (^'d 4h here) F160 52kg 3K/2.25 Bath Prof 4 Heparin none  Hect 2 EPO 2200  OP labs: 10.5/24%/1600   pth 433  Assessment:  1 Cardiac arrest  2 Severe AS- not a candidate for procedures per surgeons  3 Cdif infection- better, on po vanc 4 AFib on amio  5 ESRD on hemodialysis  6 Anemia / LGIB / CKD- darbe 60/wk, transfused, Hb stable 10.9  7 DM on insulin 8 COPD  9 2HPT cont vit D, 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  10 Vol- vol stable, up 1kg 11 Partial DNR 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  Recent Labs Lab 11/05/13 1300 11/09/13 0743  ALBUMIN 2.4* 2.9*    Recent Labs Lab 11/08/13 1835 11/09/13 0743 11/09/13 2050 11/10/13 0845  WBC 11.5* 12.9* 11.0* 12.8*  NEUTROABS 8.2*  --  8.6* 9.9*  HGB 8.1* 6.6* 10.7* 10.9*  HCT 24.2* 19.3* 30.7* 32.3*  MCV 90.6 90.6 91.4 92.6  PLT 269 245 211 211   . amiodarone  200 mg Oral Daily  . aspirin  81 mg Oral Daily  . atorvastatin  40  mg Oral q1800  . clopidogrel  75 mg Oral Q breakfast  . [START ON 11/14/2013] darbepoetin (ARANESP) injection - DIALYSIS  150 mcg Intravenous Q Wed-HD  . doxercalciferol  2 mcg Intravenous Q M,W,F-HD  . feeding supplement (RESOURCE BREEZE)  1 Container Oral TID BM  . insulin aspart  0-9 Units Subcutaneous TID WC  . pantoprazole (PROTONIX) IV  40 mg Intravenous BID  . sodium chloride  3 mL Intravenous Q12H  . vancomycin  125 mg Oral QID   . phenylephrine (NEO-SYNEPHRINE) Adult infusion Stopped (11/05/13 2315)   acetaminophen, acetaminophen, loratadine

## 2013-11-10 NOTE — Progress Notes (Signed)
Subjective:  Diarrhea Resolved, Hg stable. [name]. Patient is sad that she can't go to her family reunion in [place] this weekend.  Objective: Vital signs in last 24 hours: Filed Vitals:   11/10/13 0300 11/10/13 0749 11/10/13 0750 11/10/13 0957  BP:   159/46   Pulse: 61     Temp: 98.1 F (36.7 C) 96.3 F (35.7 C)    TempSrc: Oral Axillary    Resp:  22  23  Height:      Weight: 117 lb 11.6 oz (53.4 kg)     SpO2: 98% 96%  96%   Weight change: 1 lb 1.6 oz (0.5 kg)  Intake/Output Summary (Last 24 hours) at 11/10/13 1035 Last data filed at 11/09/13 1110  Gross per 24 hour  Intake      0 ml  Output   1920 ml  Net  -1920 ml   Physical Exam  Constitutional: She is oriented to person, place, and time. She appears well-developed and well-nourished.  HENT:  Head: Normocephalic and atraumatic.  Cardiovascular: Normal rate, regular rhythm and intact distal pulses.   Murmur heard. crescendo decresendo 4/6 murmur in LUSB radiating to carotids.  Pulmonary/Chest: Effort normal and breath sounds normal. No respiratory distress. She has no wheezes. She has no rales.  Abdominal: Soft. Bowel sounds are normal. She exhibits no distension. There is no tenderness. There is no rebound and no guarding.  Musculoskeletal: She exhibits no edema and no tenderness.  Neurological: She is alert and oriented to person, place, and time.  Psychiatric: She has a normal mood and affect. Her behavior is normal.    Lab Results: Basic Metabolic Panel:  Recent Labs Lab 11/05/13 1300 11/05/13 2000  11/06/13 0247 11/08/13 0716 11/09/13 0743  NA 126* 141  < > 135* 136* 133*  K 4.0 <2.2*  < > 4.0 3.9 3.8  CL 87* 112  < > 94* 94* 92*  CO2 24 18*  < > _0 GLUCOSE 192* 87  < > 138* 134* 170*  BUN 46* 12  < > 20 27* 40*  CREATININE 5.13* 1.67*  < > 3.01* 2.32* 3.56*  CALCIUM 8.9 5.2*  < > 10.1 9.2 9.2  MG  --  1.1*  --  1.8  --   --   PHOS 4.0  --   --   --   --  4.1  < > = values in this  interval not displayed. Liver Function Tests:  Recent Labs Lab 11/05/13 1300 11/09/13 0743  ALBUMIN 2.4* 2.9*   CBC:  Recent Labs Lab 11/09/13 2050 11/10/13 0845  WBC 11.0* 12.8*  NEUTROABS 8.6* 9.9*  HGB 10.7* 10.9*  HCT 30.7* 32.3*  MCV 91.4 92.6  PLT 211 211   CBG:  Recent Labs Lab 11/08/13 1644 11/08/13 2117 11/09/13 1243 11/09/13 1638 11/09/13 2142 11/10/13 0835  GLUCAP 202* 134* 112* 295* 159* 139*    Micro Results: Recent Results (from the past 240 hour(s))  MRSA PCR SCREENING     Status: None   Collection Time    11/05/13  9:16 PM      Result Value Ref Range Status   MRSA by PCR NEGATIVE  NEGATIVE Final   Comment:            The GeneXpert MRSA Assay (FDA     approved for NASAL specimens     only), is one component of a     comprehensive MRSA colonization  surveillance program. It is not     intended to diagnose MRSA     infection nor to guide or     monitor treatment for     MRSA infections.   Medications: I have reviewed the patient's current medications. Scheduled Meds: . amiodarone  200 mg Oral Daily  . aspirin  81 mg Oral Daily  . atorvastatin  40 mg Oral q1800  . clopidogrel  75 mg Oral Q breakfast  . [START ON 11/14/2013] darbepoetin (ARANESP) injection - DIALYSIS  150 mcg Intravenous Q Wed-HD  . doxercalciferol  2 mcg Intravenous Q M,W,F-HD  . feeding supplement (RESOURCE BREEZE)  1 Container Oral TID BM  . insulin aspart  0-9 Units Subcutaneous TID WC  . pantoprazole (PROTONIX) IV  40 mg Intravenous BID  . sodium chloride  3 mL Intravenous Q12H  . vancomycin  125 mg Oral QID   Continuous Infusions: . phenylephrine (NEO-SYNEPHRINE) Adult infusion Stopped (11/05/13 2315)   PRN Meds:.acetaminophen, acetaminophen, loratadine Assessment/Plan: Principal Problem:   Clostridium difficile colitis Active Problems:   End stage renal disease   DM (diabetes mellitus)   HTN (hypertension)   HLD (hyperlipidemia)   COPD (chronic  obstructive pulmonary disease)   Aortic stenosis, moderate at cath 07/11/13   PAF (paroxysmal atrial fibrillation)   CAD- CABG X 3 09/2009, HSRA/DES to native RCA 07/11/13   Smoker   Atrial fibrillation   Chronic right-sided heart failure   Cardiac arrest   Sinus bradycardia   Aortic stenosis  Multifactorial Heart and Kidney Disease The patient has severe heart disease that is complicated by her ESRD. She appears to have trouble tolerating HD, with recent episodes of PEA arrest and SVT complicated by hypotension both occurring during HD. Given her severe AS, CAD, and multiple comorbidities, the patient was evaluated for interventions than may benefit her in terms of quality of life or longevity. The multidisciplinary valve team thoughtfully evaluated the patient for open AS repair combined with or without redo CABG as well as TAVR. It was felt that her comorbidities preclude her for open intervention. The risk of mortality as a results of TAVR was estimated at 50%. Thus, TAVR was determined to be not indicated for this patient. The TAVR team has recommended a palliative care consult to establish goals of care given her poor prognosis. On 3/12, I had a meeting with the patient and her son and brother. We discussed her code status. The patient elected to have a limited code (pressors and shocks OK, no CPR and no intubation).  C. Diff Colitis c/b acute blood loss anemia The patient has diarrhea in setting of C Diff Ag positivity c/b black stool and dropping Hg. The patients diarrhea has now resolved.. The patients colitis is likely due to C. Diff but could also have resulted after her episodes of hypotension. The bleeding is likely a result of this colitis.  - CBC in am. - Transfuse for Hg less than 8. - continue po vancomycin 156m 4 times daily  - Continue ASA and plavix given patients recent NSTEMI and known severe CAD  NSTEMI The patient had elevated troponin after becoming hypotensive on 3/9.  Troponin peaked at 1.05 and down trended to .73 and .69. This is likely due to the patients known CAD. She has had multiple NSTEMI in the past. Appears stable at this time. - Continue ASA and plavix  Severe Aortic Stenosis  It appears that the TAVR team does not believe that the patient would benefit  from open AS repair with or without redo CABG. Further, the note states that TAVR is unlikely to provide significant improved QOL or length of life. Yesterday, Dr. Burt Knack met with patient and her family. The decision was made to not pursue TAVR as the expected mortality rate from TAVR is 50%.  - monitor volume status  - maintain MAP >60 with 250 cc fluid boluses.   ESRD - on MWF HD.  - HD per nephrology   Dispo: Disposition is deferred at this time, awaiting improvement of current medical problems.  Anticipated discharge in approximately 1-2 day(s).   The patient does have a current PCP (Imran Gertie Baron, MD) and does need an [facility] hospital follow-up appointment after discharge.  The patient does have transportation limitations that hinder transportation to clinic appointments.  .Services Needed at time of discharge: Y = Yes, Blank = No PT:   OT:   RN:   Equipment:   Other:     LOS: 12 days   Marrion Coy, MD 11/10/2013, 10:35 AM

## 2013-11-10 NOTE — Progress Notes (Signed)
Subjective: NAEON.  1 stool recorded overnight, 1 this morning; nursing reports stool is loose, small volume, non-bloody, not frank diarrhea.  Patient reports feeling well, no lightheadedness/CP/shortness of breath/abdominal pain.  Objective: Vital signs in last 24 hours: Temp:  [96.3 F (35.7 C)-99.1 F (37.3 C)] 96.3 F (35.7 C) (03/14 0749) Pulse Rate:  [60-66] 61 (03/14 0300) Resp:  [20-28] 22 (03/14 0749) BP: (89-165)/(31-75) 159/46 mmHg (03/14 0750) SpO2:  [96 %-100 %] 96 % (03/14 0749) Weight:  [52.8 kg (116 lb 6.5 oz)-53.4 kg (117 lb 11.6 oz)] 53.4 kg (117 lb 11.6 oz) (03/14 0300) Weight change: 0.5 kg (1 lb 1.6 oz) 03/13 0701 - 03/14 0700 In: 681 [Blood:681] Out: 1920   Intake/Output Summary (Last 24 hours) at 11/10/13 0944 Last data filed at 11/09/13 1110  Gross per 24 hour  Intake    335 ml  Output   1920 ml  Net  -1585 ml   Physical Exam General: Sitting in chair eating breakfast, no acute distress. Eyes: EOMI.  Conjunctivae normal, sclerae clear and anicteric. HENT: Oropharyngeal mucous membranes dry appearing Pulm: Normal respiratory rate and effort.  CTAB, no wheezes or crackles appreciated. Cardio: RRR, S1 and S2 with harsh holosystolic murmur, unchanged. Abdomen: Normoactive bowel sounds. Soft, non-tender, non-distended. Extremities: Warm and well perfused, with no edema or cyanosis.  Bandage placed over right lower extremity lesion. Neuro: Alert, oriented, and appropriate.  CN II-XII grossly intact.  Lab Results: Basic Metabolic Panel:  Recent Labs Lab 11/05/13 1300 11/05/13 2000  11/06/13 0247 11/08/13 0716 11/09/13 0743  NA 126* 141  < > 135* 136* 133*  K 4.0 <2.2*  < > 4.0 3.9 3.8  CL 87* 112  < > 94* 94* 92*  CO2 24 18*  < > 26 24 24   GLUCOSE 192* 87  < > 138* 134* 170*  BUN 46* 12  < > 20 27* 40*  CREATININE 5.13* 1.67*  < > 3.01* 2.32* 3.56*  CALCIUM 8.9 5.2*  < > 10.1 9.2 9.2  MG  --  1.1*  --  1.8  --   --   PHOS 4.0  --   --   --    --  4.1  < > = values in this interval not displayed. Liver Function Tests:  Recent Labs Lab 11/05/13 1300 11/09/13 0743  ALBUMIN 2.4* 2.9*   CBC:  Recent Labs Lab 11/08/13 1835 11/09/13 0743 11/09/13 2050  WBC 11.5* 12.9* 11.0*  NEUTROABS 8.2*  --  8.6*  HGB 8.1* 6.6* 10.7*  HCT 24.2* 19.3* 30.7*  MCV 90.6 90.6 91.4  PLT 269 245 211   CBG:  Recent Labs Lab 11/08/13 1644 11/08/13 2117 11/09/13 1243 11/09/13 1638 11/09/13 2142 11/10/13 0835  GLUCAP 202* 134* 112* 295* 159* 139*   Misc. Labs: None  Studies/Results: No new studies  Medications: I have reviewed the patient's current medications. Scheduled Meds: . amiodarone  200 mg Oral Daily  . aspirin  81 mg Oral Daily  . atorvastatin  40 mg Oral q1800  . clopidogrel  75 mg Oral Q breakfast  . [START ON 11/14/2013] darbepoetin (ARANESP) injection - DIALYSIS  150 mcg Intravenous Q Wed-HD  . doxercalciferol  2 mcg Intravenous Q M,W,F-HD  . feeding supplement (RESOURCE BREEZE)  1 Container Oral TID BM  . insulin aspart  0-9 Units Subcutaneous TID WC  . pantoprazole (PROTONIX) IV  40 mg Intravenous BID  . sodium chloride  3 mL Intravenous Q12H  . vancomycin  125 mg Oral QID   Continuous Infusions: . phenylephrine (NEO-SYNEPHRINE) Adult infusion Stopped (11/05/13 2315)   PRN Meds:.acetaminophen, acetaminophen, loratadine Assessment/Plan: [name] is a 73 year old dialysis patient with multiple cardiac conditions including CAD, aortic stenosis, and PAF who presented on 10/29/13 after receiving 10 minutes of CPR with ROSC after an apparent cardiac arrest during her dialysis session.  She had an additional cardiac event with SVT on 3/9; otherwise tolerated inpatient HD well.  Further HD likely poses risk of additional cardiac events; prognosis and code status discussed with patient and family.  She would like medical interventions including cardioversion if necessary, but does not want intubation or chest  compressions.  She has also tested positive for C diff colitis, initially resolved on vanc but now with recurrence of diarrhea including bloody stools, with drop in Hgb requiring 2 PRBC transfusions, 3 units in total.  # Anemia 2/2 hematochezia--hematochezia in the setting of C diff ag positivity, likely 2/2 C diff colitis vs hemorrhoids vs diverticuli vs AVM vs upper GI bleed.  Patient has required 2 PRBC transfusions on 3/12 (1 unit) and 3/13 (2 units).  Hgb since 3/9: 7.9->6.6 (received 1 unit) ->8.6->8.1->6.6 (received 2 units) -> 10.7.  She is hemodynamically stable.  BRBPR is not new per patient report.  Colonoscopy with Dr. Lyda Jester in 2009 showed external hemorroids, removed hyperplastic polyps with recommended 10 year follow up.  Patient likely not a candidate for GI procedures presently. -will continue ASA and plavix in setting of NSTEMI 5 days ago -discontinued heparin ppx; switched to SCDs -continue protonix -trend CBCs until stable x3  # Diarrhea, presumed C diff colitis--with hematochezia as above.  1 stool overnight, 1 this morning (per subjective history), suggesting improved diarrhea.  C diff ag positive on 3/3; on po vanc.  Diarrhea initially seemed to resolve after 72hr on vanc, but recurred in setting of cardiac event with hypotension.  Most likely refractory C diff colitis, supported by increased WBC; setting raises concern for mesenteric ischemia, but pt's abdominal exam is benign. - continue po vancomycin 155m 4 times daily - consider escalating C diff antibiotics if not improving  # [facility] SVTs during HD on 3/9; otherwise tolerating inpatient HD well.  Discussed prognosis and code status with patient and family given tenuous status in HD; patient wishes not to be intubated or to have chest compressions, but would like to be medically or electrically cardioverted if necessary.  Partial code order entered for inpatient setting -HD per  nephrology  # Hypotension - RESOLVED- No further episodes of hypotension since HD on 3/9.  Tolerating HD well today. - continue to monitor - hold off on metoprolol in future  # Narrow complex arrhythmia - RESOLVED- developed in the setting of HD on 3/9.  P waves indiscernible in setting of tachycardia and pre-existing bundle blocks, EKGs read as SVT but cardiology noted irregularly irregular rhythm concerning for Afib in pt with known PAF - will hold metoprolol in future - if SVT vs Afib develops in future, consider cardioversion with IV amio - appreciate cardiology's involvement  # PEA arrest - RESOLVED - PEA arrest in the setting of severe AS, dialysis, and possibly volume depletion from C difficile diarrhea.  AS likely a significant contributing factor; patient not a candidate for open valve replacement.  Pt and family met with cardiology today; opted not to proceed with TAVR given high mortality risk from procedure and unclear clinical benefit.  -  on tele -hold metoprolol  -continue amiodarone 265m daily  -continue to monitor patient's blood pressure, and maintain preload with cautious IVF boluses as needed   # Severe Aortic Stenosis - Will not proceed with TAVR as above.   # Paroxysmal Afib - Patient has been in sinus bradycardiac or NSR this admission, excepting 3/9 as above. -hold metoprolol, continued amiodarone at 2063mdaily as above -not a candidate for anticoagulation or EP procedures  # QT prolongation - seen on prior EKG's, though mildly higher on admission compared to prior (QTc = 549 from 504).  -continue telemetry  -patient on anti-arrhythmic therapy, not candidate for EP procedure   # CAD - s/p CABG x3 in 2011 (LIMA to LAD, vein to circumflex, vein to PDA), s/p cath Nov 2014 (DES to RCA).  -continue aspirin, plavix, statin   # DM2 - On home Novolog 70/30 on a sliding scale, pt unclear on dosing. Per dietician, pt has not been taking insulin at home or checking blood  glucose. Patient's A1c is 8.5. Patient has received 9-10 units of insulin total per day over the weekend, with CBGs ranging 156-216  -continue sensitive SSI  -start pt on novolog 5 units with breakfast / 8 units with dinner  Dispo: PT recommendation on 3/5 and 3/8 that patient be discharged with home health PT with 24 hr supervision/assistance; will ask to re-evaluate patient given concerns about ability to self-care at home.  Dispo deferred pending resolution of GI bleeding problem.  Potential discharge early next week.  This is a [employer] information officerote.  The care of the patient was discussed with Dr. KoMargart Sicklesnd the assessment and plan formulated with their assistance.  Please see their attached note for official documentation of the daily encounter.   LOS: 12 days   NaCherlyn LabellaMed Student 11/10/2013, 9:44 AM

## 2013-11-10 NOTE — Progress Notes (Signed)
I have read and agree with the medical students note.  [name], MD 

## 2013-11-11 LAB — GLUCOSE, CAPILLARY
GLUCOSE-CAPILLARY: 189 mg/dL — AB (ref 70–99)
Glucose-Capillary: 152 mg/dL — ABNORMAL HIGH (ref 70–99)
Glucose-Capillary: 133 mg/dL — ABNORMAL HIGH (ref 70–99)
Glucose-Capillary: 151 mg/dL — ABNORMAL HIGH (ref 70–99)

## 2013-11-11 MED ORDER — MENTHOL 3 MG MT LOZG
1.0000 | LOZENGE | OROMUCOSAL | Status: DC | PRN
  Administered 2013-11-11: 3 mg via ORAL
  Filled 2013-11-11: qty 9

## 2013-11-11 NOTE — Progress Notes (Signed)
Subjective: [name].  1 stool recorded overnight; patient reports her diarrhea remains 'checked'.  She is feeling well and denies lightheadedness/HA/CP/abdominal pain.  Objective: Vital signs in last 24 hours: Temp:  [97.6 F (36.4 C)-98.5 F (36.9 C)] 97.6 F (36.4 C) (03/15 0522) Pulse Rate:  [65-66] 65 (03/15 0522) Resp:  [18] 18 (03/15 0522) BP: (120-149)/(36-48) 149/48 mmHg (03/15 0522) SpO2:  [92 %-94 %] 92 % (03/15 0522) Weight:  [54.25 kg (119 lb 9.6 oz)] 54.25 kg (119 lb 9.6 oz) (03/15 0522) Weight change: -0.45 kg (-15.9 oz) 03/14 0701 - 03/15 0700 In: 480 [P.O.:480] Out: -   Intake/Output Summary (Last 24 hours) at 11/11/13 1207 Last data filed at 11/11/13 0500  Gross per 24 hour  Intake    300 ml  Output      0 ml  Net    300 ml   Physical Exam General: Resting in bed in no acute distress. Eyes: EOMI.  Conjunctivae normal, sclerae clear and anicteric. HENT: Oropharyngeal mucous membranes normal appearing. Pulm: Normal respiratory rate and effort.  CTAB, no wheezes or crackles appreciated. Cardio: RRR, S1 and S2 with harsh holosystolic murmur, unchanged. Abdomen: Normoactive bowel sounds. Soft, non-tender, non-distended. Extremities: Warm and well perfused, with no edema or cyanosis.  Bandage placed over right lower extremity lesion. Neuro: Alert, oriented, and appropriate.  CN II-XII grossly intact.  Lab Results: Basic Metabolic Panel: CBC:  Recent Labs Lab 11/09/13 2050 11/10/13 0845  WBC 11.0* 12.8*  NEUTROABS 8.6* 9.9*  HGB 10.7* 10.9*  HCT 30.7* 32.3*  MCV 91.4 92.6  PLT 211 211   CBG:  Recent Labs Lab 11/10/13 0835 11/10/13 1247 11/10/13 1652 11/10/13 2129 11/11/13 0752 11/11/13 1138  GLUCAP 139* 200* 102* 198* 152* 133*   Misc. Labs: None  Studies/Results: No new studies  Medications: I have reviewed the patient's current medications. Scheduled Meds: . amiodarone  200 mg Oral Daily  . aspirin  81 mg Oral Daily  . atorvastatin   40 mg Oral q1800  . clopidogrel  75 mg Oral Q breakfast  . [START ON 11/14/2013] darbepoetin (ARANESP) injection - DIALYSIS  150 mcg Intravenous Q Wed-HD  . doxercalciferol  2 mcg Intravenous Q M,W,F-HD  . feeding supplement (RESOURCE BREEZE)  1 Container Oral TID BM  . insulin aspart  0-9 Units Subcutaneous TID WC  . pantoprazole (PROTONIX) IV  40 mg Intravenous BID  . sodium chloride  3 mL Intravenous Q12H  . vancomycin  125 mg Oral QID   Continuous Infusions: . phenylephrine (NEO-SYNEPHRINE) Adult infusion Stopped (11/05/13 2315)   PRN Meds:.acetaminophen, acetaminophen, loratadine, menthol-cetylpyridinium Assessment/Plan: [name] is a 73 year old dialysis patient with multiple cardiac conditions including CAD, aortic stenosis, and PAF who presented on 10/29/13 after receiving 10 minutes of CPR with ROSC after an apparent cardiac arrest during her dialysis session.  She had an additional cardiac event with SVT on 3/9; otherwise tolerated inpatient HD well.  Further HD likely poses risk of additional cardiac events; prognosis and code status discussed with patient and family.  She would like medical interventions including cardioversion if necessary, but does not want intubation or chest compressions.  She has also tested positive for C diff colitis, initially resolved on vanc but now with recurrence of diarrhea including bloody stools, with drop in Hgb requiring 2 PRBC transfusions, 3 units in total.  # Anemia 2/2 hematochezia--hematochezia in the setting of C diff ag positivity, likely 2/2 C diff colitis.  Patient has required 2 PRBC  transfusions on 3/12 (1 unit) and 3/13 (2 units).  Hgb since 3/9: 7.9->6.6 (received 1 unit) ->8.6->8.1->6.6 (received 2 units) -> 10.7 -> 10.9.  She is hemodynamically stable, and Hgb has been stable x2 -will continue ASA and plavix in setting of NSTEMI 5 days ago -discontinued heparin ppx; on SCDs -continue protonix -will trend one more CBC to ensure resolution  of GI bleed  # Diarrhea, presumed C diff colitis--with hematochezia as above.  1 stool overnight, 1 this morning (per subjective history), suggesting improved diarrhea.  C diff ag positive on 3/3; on po vanc.  Diarrhea initially seemed to resolve after 72hr on vanc, but recurred in setting of cardiac event with hypotension.  Most likely refractory C diff colitis, supported by increased WBC; setting raises concern for mesenteric ischemia, but pt's abdominal exam is benign.  Diarrhea again seems to have resolved -continue po vancomycin 135m 4 times daily -will given recurrence of symptoms, will likely need a longer course of treatment  [facility] SVT with hypotension during HD on 3/9; otherwise tolerating inpatient HD well.  Discussed prognosis and code status with patient and family given tenuous status in HD; patient wishes not to be intubated or to have chest compressions, but would like to be medically or electrically cardioverted if necessary.  Partial code order entered for inpatient setting -HD per nephrology  Multifactorial Heart Disease--Patient has a history of CAD s/p CABG x3 in 2011 (LIMA to LAD, vein to circumflex, vein to PDA), s/p cath Nov 2014 (DES to RCA), AS, and PAF.  She was admitted for PEA arrest in the setting of severe AS, dialysis, and possibly volume depletion from C difficile diarrhea.  AS likely a significant contributing factor; patient not a candidate for open valve replacement or EP procedures, and pt and family met with cardiology today, with decision not to proceed with TAVR given high mortality risk from procedure and unclear clinical benefit.  Narrow complex arrhythmia with hypotension developed eveloped in the setting of HD on 3/9.  Patient has tolerated HD well and remained in sinus rhythm since. -continue tele -hold metoprolol in future, as patient did not tolerate metoprolol in setting of AS  -continue aspirin, plavix, statin    -continue amiodarone 2086mdaily  -consider high dose amiodarone to break SVTs if necessary -continue to monitor patient's blood pressure, and maintain preload with cautious IVF boluses as needed   # DM2 -A1c is 8.5.  Blood glucose under fair control on sensitive SSI -continue sensitive SSI  -consider starting pt on novolog 5 units with breakfast / 8 units with dinner  Dispo: PT recommendation on 3/5 and 3/8 that patient be discharged with home health PT with 24 hr supervision/assistance; will ask to re-evaluate patient given concerns about ability to self-care at home.  Dispo deferred pending resolution of GI bleeding problem.  Potential discharge early this week.  This is a [employer] information officerote.  The care of the patient was discussed with Dr. KoMargart Sicklesnd the assessment and plan formulated with their assistance.  Please see their attached note for official documentation of the daily encounter.   LOS: 13 days   NaCherlyn LabellaMed Student 11/11/2013, 12:07 PM

## 2013-11-11 NOTE — Progress Notes (Signed)
Subjective:  [name]. Feels much better. Is excited to hopefully go to SNF or home tomorrow.  Objective: Vital signs in last 24 hours: Filed Vitals:   11/10/13 1212 11/10/13 1630 11/10/13 2106 11/11/13 0522  BP: 120/36 126/43 137/47 149/48  Pulse:  65 66 65  Temp: 98.4 F (36.9 C) 98.2 F (36.8 C) 98.5 F (36.9 C) 97.6 F (36.4 C)  TempSrc: Oral Oral Oral Oral  Resp:  18 18 18   Height:      Weight:    119 lb 9.6 oz (54.25 kg)  SpO2: 94% 94% 94% 92%   Weight change: -15.9 oz (-0.45 kg)  Intake/Output Summary (Last 24 hours) at 11/11/13 1046 Last data filed at 11/11/13 0500  Gross per 24 hour  Intake    300 ml  Output      0 ml  Net    300 ml   Physical Exam  Constitutional: She is oriented to person, place, and time. She appears well-developed and well-nourished.  HENT:  Head: Normocephalic and atraumatic.  Cardiovascular: Normal rate, regular rhythm and intact distal pulses.   Murmur heard. crescendo decresendo 4/6 murmur in LUSB radiating to carotids.  Pulmonary/Chest: Effort normal and breath sounds normal. No respiratory distress. She has no wheezes. She has no rales.  Abdominal: Soft. Bowel sounds are normal. She exhibits no distension. There is no tenderness. There is no rebound and no guarding.  Musculoskeletal: She exhibits no edema and no tenderness.  Neurological: She is alert and oriented to person, place, and time.  Psychiatric: She has a normal mood and affect. Her behavior is normal.    Lab Results: Basic Metabolic Panel:  Recent Labs Lab 11/05/13 1300 11/05/13 2000  11/06/13 0247 11/08/13 0716 11/09/13 0743  NA 126* 141  < > 135* 136* 133*  K 4.0 <2.2*  < > 4.0 3.9 3.8  CL 87* 112  < > 94* 94* 92*  CO2 24 18*  < > 26 24 24   GLUCOSE 192* 87  < > 138* 134* 170*  BUN 46* 12  < > 20 27* 40*  CREATININE 5.13* 1.67*  < > 3.01* 2.32* 3.56*  CALCIUM 8.9 5.2*  < > 10.1 9.2 9.2  MG  --  1.1*  --  1.8  --   --   PHOS 4.0  --   --   --   --  4.1  <  > = values in this interval not displayed. Liver Function Tests:  Recent Labs Lab 11/05/13 1300 11/09/13 0743  ALBUMIN 2.4* 2.9*   CBC:  Recent Labs Lab 11/09/13 2050 11/10/13 0845  WBC 11.0* 12.8*  NEUTROABS 8.6* 9.9*  HGB 10.7* 10.9*  HCT 30.7* 32.3*  MCV 91.4 92.6  PLT 211 211   CBG:  Recent Labs Lab 11/09/13 2142 11/10/13 0835 11/10/13 1247 11/10/13 1652 11/10/13 2129 11/11/13 0752  GLUCAP 159* 139* 200* 102* 198* 152*    Micro Results: Recent Results (from the past 240 hour(s))  MRSA PCR SCREENING     Status: None   Collection Time    11/05/13  9:16 PM      Result Value Ref Range Status   MRSA by PCR NEGATIVE  NEGATIVE Final   Comment:            The GeneXpert MRSA Assay (FDA     approved for NASAL specimens     only), is one component of a     comprehensive MRSA colonization  surveillance program. It is not     intended to diagnose MRSA     infection nor to guide or     monitor treatment for     MRSA infections.   Medications: I have reviewed the patient's current medications. Scheduled Meds: . amiodarone  200 mg Oral Daily  . aspirin  81 mg Oral Daily  . atorvastatin  40 mg Oral q1800  . clopidogrel  75 mg Oral Q breakfast  . [START ON 11/14/2013] darbepoetin (ARANESP) injection - DIALYSIS  150 mcg Intravenous Q Wed-HD  . doxercalciferol  2 mcg Intravenous Q M,W,F-HD  . feeding supplement (RESOURCE BREEZE)  1 Container Oral TID BM  . insulin aspart  0-9 Units Subcutaneous TID WC  . pantoprazole (PROTONIX) IV  40 mg Intravenous BID  . sodium chloride  3 mL Intravenous Q12H  . vancomycin  125 mg Oral QID   Continuous Infusions: . phenylephrine (NEO-SYNEPHRINE) Adult infusion Stopped (11/05/13 2315)   PRN Meds:.acetaminophen, acetaminophen, loratadine, menthol-cetylpyridinium Assessment/Plan: Principal Problem:   Clostridium difficile colitis Active Problems:   End stage renal disease   DM (diabetes mellitus)   HTN (hypertension)    HLD (hyperlipidemia)   COPD (chronic obstructive pulmonary disease)   Aortic stenosis, moderate at cath 07/11/13   PAF (paroxysmal atrial fibrillation)   CAD- CABG X 3 09/2009, HSRA/DES to native RCA 07/11/13   Smoker   Atrial fibrillation   Chronic right-sided heart failure   Cardiac arrest   Sinus bradycardia   Aortic stenosis  Multifactorial Heart and Kidney Disease The patient has severe heart disease that is complicated by her ESRD. She appears to have trouble tolerating HD, with recent episodes of PEA arrest and SVT complicated by hypotension both occurring during HD. Given her severe AS, CAD, and multiple comorbidities, the patient was evaluated for interventions than may benefit her in terms of quality of life or longevity. The multidisciplinary valve team thoughtfully evaluated the patient for open AS repair combined with or without redo CABG as well as TAVR. It was felt that her comorbidities preclude her for open intervention. The risk of mortality as a results of TAVR was estimated at 50%. Thus, TAVR was determined to be not indicated for this patient. The TAVR team has recommended a palliative care consult to establish goals of care given her poor prognosis. On 3/12, I had a meeting with the patient and her son and brother. We discussed her code status. The patient elected to have a limited code (pressors and shocks OK, no CPR and no intubation).  C. Diff Colitis c/b acute blood loss anemia- Resolved The patients diarrhea has now resolved.. The patients colitis is likely due to C. Diff but could also have resulted after her episodes of hypotension. The bleeding is likely a result of this colitis.  - CBC in am. - Transfuse for Hg less than 8. - continue po vancomycin 159m 4 times daily (on day 11 of 14) - Continue ASA and plavix given patients recent NSTEMI and known severe CAD  NSTEMI The patient had elevated troponin after becoming hypotensive on 3/9. Troponin peaked at 1.05 and  down trended to .73 and .69. This is likely due to the patients known CAD. She has had multiple NSTEMI in the past. Appears stable at this time. - Continue ASA and plavix  Severe Aortic Stenosis  It appears that the TAVR team does not believe that the patient would benefit from open AS repair with or without redo CABG. Further, the  note states that TAVR is unlikely to provide significant improved QOL or length of life. Yesterday, Dr. Burt Knack met with patient and her family. The decision was made to not pursue TAVR as the expected mortality rate from TAVR is 50%.  - monitor volume status  - maintain MAP >60 with 250 cc fluid boluses.   ESRD - on MWF HD.  - HD per nephrology   Dispo: Discharge pending SNF placement.  The patient does have a current PCP (Imran Gertie Baron, MD) and does need an [facility] hospital follow-up appointment after discharge.  The patient does have transportation limitations that hinder transportation to clinic appointments.  .Services Needed at time of discharge: Y = Yes, Blank = No PT:   OT:   RN:   Equipment:   Other:     LOS: 13 days   Marrion Coy, MD 11/11/2013, 10:46 AM

## 2013-11-11 NOTE — Progress Notes (Signed)
  [facility] Progress Note   Subjective: No complaints  Filed Vitals:   11/10/13 1212 11/10/13 1630 11/10/13 2106 11/11/13 0522  BP: 120/36 126/43 137/47 149/48  Pulse:  65 66 65  Temp: 98.4 F (36.9 C) 98.2 F (36.8 C) 98.5 F (36.9 C) 97.6 F (36.4 C)  TempSrc: Oral Oral Oral Oral  Resp:  18 18 18   Height:      Weight:    54.25 kg (119 lb 9.6 oz)  SpO2: 94% 94% 94% 92%   Exam: Alert, no distress No jvd  L clear, dec'd R base, stable RRR 3/6 SEM , no rub  Abd soft, nt, nd  No LE pedal edema  LUA AV fistula patent on hd   Dialysis: MWF [facility]  3.5h (^'d 4h here) F160 52kg 3K/2.25 Bath Prof 4 Heparin none LUA AVF Hect 2 EPO 2200  OP labs: 10.5/24%/1600   pth 433  Assessment:  1 Cardiac arrest  2 Severe AS- not a candidate for procedures per surgeons  3 Cdif infection- better, on po vanc 4 AFib on amio  5 ESRD on hemodialysis  6 Anemia / LGIB / CKD- darbe 60/wk, transfused, Hb 10.9  7 DM on insulin 8 COPD  9 2HPT cont vit D, 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  10 Vol- vol stable, up 1kg 11 Partial DNR 12 Hx CABG w graft disease   Plan- HD Monday, awaiting SNF placement vs home    Vinson Moselleob Jomaira Darr MD  pager [phone]    cell [phone]  11/11/2013, 1:45 PM     Recent Labs Lab 11/05/13 1300  11/06/13 0247 11/08/13 0716 11/09/13 0743  NA 126*  < > 135* 136* 133*  K 4.0  < > 4.0 3.9 3.8  CL 87*  < > 94* 94* 92*  CO2 24  < > 26 24 24   GLUCOSE 192*  < > 138* 134* 170*  BUN 46*  < > 20 27* 40*  CREATININE 5.13*  < > 3.01* 2.32* 3.56*  CALCIUM 8.9  < > 10.1 9.2 9.2  PHOS 4.0  --   --   --  4.1  < > = values in this interval not displayed.  Recent Labs Lab 11/05/13 1300 11/09/13 0743  ALBUMIN 2.4* 2.9*    Recent Labs Lab 11/08/13 1835 11/09/13 0743 11/09/13 2050 11/10/13 0845  WBC 11.5* 12.9* 11.0* 12.8*  NEUTROABS 8.2*  --  8.6* 9.9*  HGB 8.1* 6.6* 10.7* 10.9*  HCT 24.2* 19.3* 30.7* 32.3*  MCV 90.6 90.6 91.4 92.6  PLT 269 245 211 211   .  amiodarone  200 mg Oral Daily  . aspirin  81 mg Oral Daily  . atorvastatin  40 mg Oral q1800  . clopidogrel  75 mg Oral Q breakfast  . [START ON 11/14/2013] darbepoetin (ARANESP) injection - DIALYSIS  150 mcg Intravenous Q Wed-HD  . doxercalciferol  2 mcg Intravenous Q M,W,F-HD  . feeding supplement (RESOURCE BREEZE)  1 Container Oral TID BM  . insulin aspart  0-9 Units Subcutaneous TID WC  . pantoprazole (PROTONIX) IV  40 mg Intravenous BID  . sodium chloride  3 mL Intravenous Q12H  . vancomycin  125 mg Oral QID   . phenylephrine (NEO-SYNEPHRINE) Adult infusion Stopped (11/05/13 2315)   acetaminophen, acetaminophen, loratadine, menthol-cetylpyridinium

## 2013-11-11 NOTE — Progress Notes (Signed)
Internal Medicine Attending  Date: 11/11/2013  Patient name: [name] Medical record number: [id number] Date of birth: [birth year] Age: 73 y.o. Gender: female  I saw and evaluated the patient. I reviewed the resident's note by Dr. Glendell DockerKomanski and I agree with the resident's findings and plans as documented in his note.  Dr. Dalphine HandingBhardwaj will take over as attending physician tomorrow 11/12/2013.

## 2013-11-11 NOTE — Progress Notes (Signed)
I have read and agree with the medical student. See my note for additional information.  Angelina Sheriffhris Inioluwa Boulay, MD

## 2013-11-12 ENCOUNTER — Telehealth: Payer: Self-pay | Admitting: *Deleted

## 2013-11-12 DIAGNOSIS — E871 Hypo-osmolality and hyponatremia: Secondary | ICD-10-CM

## 2013-11-12 DIAGNOSIS — IMO0001 Reserved for inherently not codable concepts without codable children: Secondary | ICD-10-CM

## 2013-11-12 DIAGNOSIS — R197 Diarrhea, unspecified: Secondary | ICD-10-CM

## 2013-11-12 DIAGNOSIS — K921 Melena: Secondary | ICD-10-CM

## 2013-11-12 LAB — RENAL FUNCTION PANEL
Albumin: 2.8 g/dL — ABNORMAL LOW (ref 3.5–5.2)
BUN: 39 mg/dL — AB (ref 6–23)
CALCIUM: 8.9 mg/dL (ref 8.4–10.5)
CO2: 22 meq/L (ref 19–32)
CREATININE: 4.51 mg/dL — AB (ref 0.50–1.10)
Chloride: 86 mEq/L — ABNORMAL LOW (ref 96–112)
GFR calc non Af Amer: 9 mL/min — ABNORMAL LOW (ref >90)
GFR, EST AFRICAN AMERICAN: 10 mL/min — AB (ref >90)
GLUCOSE: 155 mg/dL — AB (ref 70–99)
Phosphorus: 4.4 mg/dL (ref 2.3–4.6)
Potassium: 4.7 mEq/L (ref 3.7–5.3)
Sodium: 125 mEq/L — ABNORMAL LOW (ref 137–147)

## 2013-11-12 LAB — CBC WITH DIFFERENTIAL/PLATELET
Basophils Absolute: 0 10*3/uL (ref 0.0–0.1)
Basophils Relative: 0 % (ref 0–1)
Eosinophils Absolute: 0.3 10*3/uL (ref 0.0–0.7)
Eosinophils Relative: 3 % (ref 0–5)
HCT: 30.4 % — ABNORMAL LOW (ref 36.0–46.0)
Hemoglobin: 10 g/dL — ABNORMAL LOW (ref 12.0–15.0)
LYMPHS ABS: 0.7 10*3/uL (ref 0.7–4.0)
LYMPHS PCT: 6 % — AB (ref 12–46)
MCH: 30.6 pg (ref 26.0–34.0)
MCHC: 32.9 g/dL (ref 30.0–36.0)
MCV: 93 fL (ref 78.0–100.0)
Monocytes Absolute: 1.8 10*3/uL — ABNORMAL HIGH (ref 0.1–1.0)
Monocytes Relative: 16 % — ABNORMAL HIGH (ref 3–12)
NEUTROS ABS: 8.3 10*3/uL — AB (ref 1.7–7.7)
NEUTROS PCT: 75 % (ref 43–77)
PLATELETS: 272 10*3/uL (ref 150–400)
RBC: 3.27 MIL/uL — AB (ref 3.87–5.11)
RDW: 15.8 % — ABNORMAL HIGH (ref 11.5–15.5)
WBC: 11.1 10*3/uL — AB (ref 4.0–10.5)

## 2013-11-12 LAB — GLUCOSE, CAPILLARY
GLUCOSE-CAPILLARY: 121 mg/dL — AB (ref 70–99)
Glucose-Capillary: 148 mg/dL — ABNORMAL HIGH (ref 70–99)

## 2013-11-12 MED ORDER — NEPRO/CARBSTEADY PO LIQD
237.0000 mL | ORAL | Status: DC | PRN
  Filled 2013-11-12: qty 237

## 2013-11-12 MED ORDER — HEPARIN SODIUM (PORCINE) 1000 UNIT/ML DIALYSIS
1000.0000 [IU] | INTRAMUSCULAR | Status: DC | PRN
  Filled 2013-11-12: qty 1

## 2013-11-12 MED ORDER — DOXERCALCIFEROL 4 MCG/2ML IV SOLN
INTRAVENOUS | Status: AC
  Filled 2013-11-12: qty 2

## 2013-11-12 MED ORDER — PENTAFLUOROPROP-TETRAFLUOROETH EX AERO: 1.0000 "application " | INHALATION_SPRAY | CUTANEOUS | Status: DC | PRN

## 2013-11-12 MED ORDER — SODIUM CHLORIDE 0.9 % IV SOLN: 100.0000 mL | INTRAVENOUS | Status: DC | PRN

## 2013-11-12 MED ORDER — VANCOMYCIN 50 MG/ML ORAL SOLUTION: 125.0000 mg | Freq: Four times a day (QID) | ORAL | Status: DC

## 2013-11-12 MED ORDER — LIDOCAINE HCL (PF) 1 % IJ SOLN
5.0000 mL | INTRAMUSCULAR | Status: DC | PRN
  Filled 2013-11-12: qty 5

## 2013-11-12 MED ORDER — LIDOCAINE-PRILOCAINE 2.5-2.5 % EX CREA: 1.0000 "application " | TOPICAL_CREAM | CUTANEOUS | Status: DC | PRN

## 2013-11-12 MED ORDER — AMIODARONE HCL 200 MG PO TABS: 200.0000 mg | ORAL_TABLET | Freq: Every day | ORAL | Status: DC

## 2013-11-12 MED ORDER — ALTEPLASE 2 MG IJ SOLR
2.0000 mg | Freq: Once | INTRAMUSCULAR | Status: DC | PRN
  Filled 2013-11-12: qty 2

## 2013-11-12 NOTE — Clinical Social Work Psychosocial (Signed)
Clinical Social Work Department  BRIEF PSYCHOSOCIAL ASSESSMENT  Patient: [name]  Account Number: [id number]   Admit date: 10/29/13 Clinical Social Worker Rhea Pink, MSW Date/Time: 11/12/2013 12:28 Referred by: Physician Date Referred: 11/12/2013  Referred for   SNF Placement   Other Referral:  Interview type: Patient  Other interview type: PSYCHOSOCIAL DATA  Living Status:Patient lives alone in [place] Admitted from facility:  Level of care:  Primary support name: [name] Primary support relationship to patient: Brother  Degree of support available:  Strong and vested  CURRENT CONCERNS  Current Concerns   Post-Acute Placement   Other Concerns:  SOCIAL WORK ASSESSMENT / PLAN  CSW was contacted by MD about placement. CSW met with pt re: PT recommendation for SNF.   Pt lives alone  CSW explained placement process and answered questions.   Pt reports she would like a facility in [place] completed FL2 and initiated SNF search.     Assessment/plan status: Information/Referral to [employer]  Other assessment/ plan:  Information/referral to community resources:  SNF   PTAR  PATIENT'S/FAMILY'S RESPONSE TO PLAN OF CARE:  Pt  reports she is agreeable to [facility] SNF in [place] in order to increase strength and independence with mobility prior to returning home  Pt verbalized understanding of placement process and appreciation for CSW assist.   Rhea Pink, MSW, [place]

## 2013-11-12 NOTE — Progress Notes (Signed)
    Day 14 of stay      Patient name: [name]  Medical record number: [id number]  Date of birth: Mar 23, 1941  Met with patient this morning in the HD room. She appeared well rested and denied any complains. She reports that she is not having profuse diarrhea any longer. She denies lightheadedness and chest pain. Exam significant for systolic murmur 4/6. Labs and chart reviewed, discussed discharge plan with IM team.  [place], [place] 11/12/2013, 12:42 PM.

## 2013-11-12 NOTE — Progress Notes (Signed)
I have read and agree with the medical students note.  Chris Khalifa Knecht, MD 

## 2013-11-12 NOTE — Progress Notes (Signed)
Occupational Therapy Treatment Patient Details Name: [name] MRN: [id number] DOB: 1940-09-28 Today's Date: 11/12/2013 Time: [id number] OT Time Calculation (min): 31 min  OT Assessment / Plan / Recommendation  History of present illness Patient is a 73 year old woman with history of coronary artery disease status post CABG and PCI, recently hospitalized on the cardiology service with chest pain and rapid atrial fibrillation and started on amiodarone, moderate to severe aortic stenosis, end-stage renal disease on hemodialysis, atrial fibrillation, and other problems as outlined in the medical history, who reportedly became unresponsive and pulseless during hemodialysis today.   OT comments  Pt making progress with functional goals and should continue with OT services to increase level of function and safety  Follow Up Recommendations  SNF;Supervision/Assistance - 24 hour    Barriers to Discharge   none    Equipment Recommendations  None recommended by OT;Other (comment) (TBD at next venue of care)    Recommendations for Other Services    Frequency Min 2X/week   Progress towards OT Goals Progress towards OT goals: Progressing toward goals  Plan Discharge plan remains appropriate    Precautions / Restrictions Precautions Precautions: Fall Precaution Comments: h/o falls, contact (brown), pt reports she uses O2 at home PRN Restrictions Weight Bearing Restrictions: No   Pertinent Vitals/Pain No c/o pain    ADL  Grooming: Performed;Wash/dry hands;Wash/dry face;Min guard Where Assessed - Grooming: Supported standing Upper Body Bathing: Simulated;Supervision/safety;Set up Where Assessed - Upper Body Bathing: Unsupported sitting Lower Body Bathing: Simulated;Minimal assistance Upper Body Dressing: Performed;Supervision/safety;Set up Where Assessed - Upper Body Dressing: Unsupported sitting Lower Body Dressing: Performed;Minimal assistance Toilet Transfer: Performed;Min  guard Toilet Transfer Method: Sit to [place] Toilet Transfer Equipment: Bedside commode Toileting - Clothing Manipulation and Hygiene: Performed;Minimal assistance Where Assessed - Engineer, miningToileting Clothing Manipulation and Hygiene: Standing Tub/Shower Transfer Method: Not assessed Equipment Used: Gait belt;Rolling walker Transfers/Ambulation Related to ADLs: min guard assist to support trunk for balance, again heavy reliance on arms for balance and stability [facility]    OT Diagnosis:    OT Problem List:   OT Treatment Interventions:     OT Goals(current goals can now be found in the care plan section) Acute Rehab OT Goals Patient Stated Goal: pt agreeable to SNF for rehab  Visit Information  Last OT Received On: 11/12/13 Assistance Needed: +1 History of Present Illness: Patient is a 73 year old woman with history of coronary artery disease status post CABG and PCI, recently hospitalized on the cardiology service with chest pain and rapid atrial fibrillation and started on amiodarone, moderate to severe aortic stenosis, end-stage renal disease on hemodialysis, atrial fibrillation, and other problems as outlined in the medical history, who reportedly became unresponsive and pulseless during hemodialysis today.    Subjective Data   " I guess I will go to rehab in [place] now"          Cognition  Cognition Arousal/Alertness: Awake/alert Behavior During Therapy: [facility] for tasks assessed/performed Overall Cognitive Status: Within Functional Limits for tasks assessed    Mobility  Bed Mobility Overal bed mobility: Modified Independent Bed Mobility: Supine to Sit Supine to sit: Modified independent (Device/Increase time);HOB elevated Sit to supine: Modified independent (Device/Increase time) General bed mobility comments: Pt with heavy reliance on upper extremity support to get to sitting.  Significant extra time needed to complete task as pt struggles up to EOB.   Transfers Overall transfer level: Needs assistance Equipment used: 1 person hand held assist Transfers: Sit to/from Stand Sit to  Stand: Min guard Stand pivot transfers: Min guard General transfer comment: min guard assist to support trunk for balance, again heavy reliance on arms for balance and stability throughout transition to [facility]          Balance Balance Overall balance assessment: Needs assistance Sitting-balance support: No upper extremity supported;Feet supported Sitting balance-Leahy Scale: Good Standing balance support: Single extremity supported;Bilateral upper extremity supported;During functional activity Standing balance-Leahy Scale: Poor Standing balance comment: relies on upper extremity support for balance when up on her feet  End of Session OT - End of Session Equipment Utilized During Treatment: Rolling walker;Gait belt;Other (comment) (BSC) Activity Tolerance: Patient tolerated treatment well Patient left: with call bell/phone within reach;with family/visitor present;in bed;with bed alarm set  GO     Galen Manila 11/12/2013, 2:33 PM

## 2013-11-12 NOTE — Progress Notes (Signed)
Subjective: NAEON.  3 stools recorded past 24 hours.  Patient reports no lightheadedness/HA/CP/diarrhea.  She does report mild gas pain.  She is looking forward to being discharged.  Objective: Vital signs in last 24 hours: Temp:  [98.6 F (37 C)-99.3 F (37.4 C)] 99.3 F (37.4 C) (03/16 0833) Pulse Rate:  [63-71] 64 (03/16 1100) Resp:  [18-26] 19 (03/16 1100) BP: (115-142)/(38-59) 138/59 mmHg (03/16 1100) SpO2:  [92 %-100 %] 100 % (03/16 0945) Weight:  [55.656 kg (122 lb 11.2 oz)-56.5 kg (124 lb 9 oz)] 56.5 kg (124 lb 9 oz) (03/16 0833) Weight change: 1.406 kg (3 lb 1.6 oz) 03/15 0701 - 03/16 0700 In: 60 [P.O.:60] Out: -   Intake/Output Summary (Last 24 hours) at 11/12/13 1141 Last data filed at 11/12/13 0600  Gross per 24 hour  Intake     60 ml  Output      0 ml  Net     60 ml   Physical Exam General: Resting in bed in no acute distress. Eyes: EOMI.  Conjunctivae normal, sclerae clear and anicteric. HENT: Oropharyngeal mucous membranes normal appearing. Pulm: Normal respiratory rate and effort.  CTAB, no wheezes or crackles appreciated. Cardio: RRR, S1 and S2 with harsh holosystolic murmur, unchanged. Abdomen: Normoactive bowel sounds. Soft, non-tender, non-distended. Extremities: Warm and well perfused, with no edema or cyanosis.  Bandage placed over right lower extremity lesion. Neuro: Alert, oriented, and appropriate.  CN II-XII grossly intact.  Lab Results: Basic Metabolic Panel: CBC:  Recent Labs Lab 11/10/13 0845 11/12/13 0520  WBC 12.8* 11.1*  NEUTROABS 9.9* 8.3*  HGB 10.9* 10.0*  HCT 32.3* 30.4*  MCV 92.6 93.0  PLT 211 272   BMET    Component Value Date/Time   NA 125* 11/12/2013 0644   K 4.7 11/12/2013 0644   CL 86* 11/12/2013 0644   CO2 22 11/12/2013 0644   GLUCOSE 155* 11/12/2013 0644   BUN 39* 11/12/2013 0644   CREATININE 4.51* 11/12/2013 0644   CREATININE 4.32* 06/19/2013 1428   CALCIUM 8.9 11/12/2013 0644   GFRNONAA 9* 11/12/2013 0644   GFRAA  10* 11/12/2013 0644   CBG:  Recent Labs Lab 11/10/13 2129 11/11/13 0752 11/11/13 1138 11/11/13 1603 11/11/13 2057 11/12/13 0727  GLUCAP 198* 152* 133* 189* 151* 148*   Misc. Labs: None  Studies/Results: No new studies  Medications: I have reviewed the patient's current medications. Scheduled Meds: . amiodarone  200 mg Oral Daily  . aspirin  81 mg Oral Daily  . atorvastatin  40 mg Oral q1800  . clopidogrel  75 mg Oral Q breakfast  . [START ON 11/14/2013] darbepoetin (ARANESP) injection - DIALYSIS  150 mcg Intravenous Q Wed-HD  . doxercalciferol      . doxercalciferol  2 mcg Intravenous Q M,W,F-HD  . feeding supplement (RESOURCE BREEZE)  1 Container Oral TID BM  . insulin aspart  0-9 Units Subcutaneous TID WC  . pantoprazole (PROTONIX) IV  40 mg Intravenous BID  . sodium chloride  3 mL Intravenous Q12H  . vancomycin  125 mg Oral QID   Continuous Infusions: . phenylephrine (NEO-SYNEPHRINE) Adult infusion Stopped (11/05/13 2315)   PRN Meds:.sodium chloride, sodium chloride, acetaminophen, acetaminophen, alteplase, feeding supplement (NEPRO CARB STEADY), heparin, lidocaine (PF), lidocaine-prilocaine, loratadine, menthol-cetylpyridinium, pentafluoroprop-tetrafluoroeth  Assessment/Plan: Ms. Cuttino is a 73 year old dialysis patient with multiple cardiac conditions including CAD, aortic stenosis, and PAF who presented on 10/29/13 after receiving 10 minutes of CPR with ROSC after an apparent cardiac arrest during her  dialysis session.  She had an additional cardiac event with SVT on 3/9; otherwise tolerated inpatient HD well.  Further HD likely poses risk of additional cardiac events; prognosis and code status discussed with patient and family.  She would like medical interventions including cardioversion if necessary, but does not want intubation or chest compressions.  She has also tested positive for C diff colitis with diarrhea including bloody stools, with drop in Hgb requiring 2 PRBC  transfusions, 3 units in total.  HDS stable, Hgb stable, hematochezia and diarrhea now resolved.  Hyponatremia--likely 2/2 excessive free water intake. -limit fluid intake to 1.5L / day, per nephrology rec  # Anemia 2/2 hematochezia--RESOLVED--hematochezia in the setting of C diff ag positivity, likely 2/2 C diff colitis.  Patient has required 2 PRBC transfusions on 3/12 (1 unit) and 3/13 (2 units).  Hgb since 3/9: 7.9->6.6 (received 1 unit) ->8.6->8.1->6.6 (received 2 units) -> 10.7 -> 10.9.  She is hemodynamically stable, Hgb stable and >10 x3, diarrhea and hematochezia resolved since 3/12. -will continue ASA and plavix in setting of NSTEMI 1 week ago -discontinued heparin ppx; on SCDs -continue protonix  # Diarrhea, presumed C diff colitis--with hematochezia as above.  1 stool overnight, 1 this morning (per subjective history), suggesting improved diarrhea.  C diff ag positive on 3/3; on po vanc.  Diarrhea initially seemed to resolve after 72hr on vanc, but recurred in setting of cardiac event with hypotension.  Most likely refractory C diff colitis, supported by increased WBC; setting raised concern for mesenteric ischemia, but pt's abdominal exam is benign.  Diarrhea resolved since 3/12 -continue po vancomycin 111m 4 times daily for 2 more days, 14 day total course of treatment  # [facility]. SVT with hypotension during HD on 3/9; otherwise tolerating inpatient HD well.  Discussed prognosis and code status with patient and family given tenuous status in HD; patient wishes not to be intubated or to have chest compressions, but would like to be medically or electrically cardioverted if necessary.  Partial code order entered for inpatient setting -HD per nephrology  # Multifactorial Heart Disease--Patient has a history of CAD s/p CABG x3 in 2011 (LIMA to LAD, vein to circumflex, vein to PDA), s/p cath Nov 2014 (DES to RCA), AS, and PAF.  She was admitted for PEA  arrest in the setting of severe AS, dialysis, and possibly volume depletion from C difficile diarrhea.  AS likely a significant contributing factor; patient not a candidate for open valve replacement or EP procedures, and pt and family met with cardiology today, with decision not to proceed with TAVR given high mortality risk from procedure and unclear clinical benefit.  Narrow complex arrhythmia with hypotension developed eveloped in the setting of HD on 3/9.  Patient has tolerated HD well and remained in sinus rhythm since. -continue tele -hold metoprolol in future, as patient did not tolerate metoprolol in setting of AS  -continue aspirin, plavix, statin  -continue amiodarone 2089mdaily  -consider high dose amiodarone to break SVTs if necessary -continue to monitor patient's blood pressure, and maintain preload with cautious IVF boluses as needed   # DM2 -A1c is 8.5.  Blood glucose under fair control on sensitive SSI -continue sensitive SSI  -consider starting pt on novolog 5 units with breakfast / 8 units with dinner  Dispo: Pt medically ready for discharge; pending SNF placement  This is a [employer] information officerote.  The care of the patient was discussed with  Dr. Margart Sickles and the assessment and plan formulated with their assistance.  Please see their attached note for official documentation of the daily encounter.   LOS: 14 days   Cherlyn Labella, Med Student 11/12/2013, 11:41 AM

## 2013-11-12 NOTE — Procedures (Signed)
I was present at this dialysis session. I have reviewed the session itself and made appropriate changes.   Pt w/o complaint and VSS.  Na 125 this AM suggesting excessive free water intake, limit to 1.5L fluids/day for now.  AVF goal UF 3L.    [name] Briahna Pescador  MD 11/12/2013, 9:02 AM

## 2013-11-12 NOTE — Progress Notes (Signed)
Subjective:  NAEON. Ready for d/c pending SNF placement.  Objective: Vital signs in last 24 hours: Filed Vitals:   11/11/13 0522 11/11/13 1600 11/11/13 2104 11/12/13 0500  BP: 149/48 119/38 134/55 142/49  Pulse: 65 64 66 71  Temp: 97.6 F (36.4 C) 98.6 F (37 C) 98.6 F (37 C) 98.6 F (37 C)  TempSrc: Oral Oral Oral Oral  Resp: _0 Height:      Weight: 119 lb 9.6 oz (54.25 kg)   122 lb 11.2 oz (55.656 kg)  SpO2: 92% 93% 92% 92%   Weight change: 3 lb 1.6 oz (1.406 kg)  Intake/Output Summary (Last 24 hours) at 11/12/13 7209 Last data filed at 11/12/13 0600  Gross per 24 hour  Intake     60 ml  Output      0 ml  Net     60 ml   Physical Exam  Constitutional: She is oriented to person, place, and time. She appears well-developed and well-nourished.  HENT:  Head: Normocephalic and atraumatic.  Cardiovascular: Normal rate, regular rhythm and intact distal pulses.   Murmur heard. crescendo decresendo 4/6 murmur in LUSB radiating to carotids.  Pulmonary/Chest: Effort normal and breath sounds normal. No respiratory distress. She has no wheezes. She has no rales.  Abdominal: Soft. Bowel sounds are normal. She exhibits no distension. There is no tenderness. There is no rebound and no guarding.  Musculoskeletal: She exhibits no edema and no tenderness.  Neurological: She is alert and oriented to person, place, and time.  Psychiatric: She has a normal mood and affect. Her behavior is normal.    Lab Results: Basic Metabolic Panel:  Recent Labs Lab 11/05/13 2000  11/06/13 0247  11/09/13 0743 11/12/13 0644  NA 141  < > 135*  < > 133* 125*  K <2.2*  < > 4.0  < > 3.8 4.7  CL 112  < > 94*  < > 92* 86*  CO2 18*  < > 26  < > 24 22  GLUCOSE 87  < > 138*  < > 170* 155*  BUN 12  < > 20  < > 40* 39*  CREATININE 1.67*  < > 3.01*  < > 3.56* 4.51*  CALCIUM 5.2*  < > 10.1  < > 9.2 8.9  MG 1.1*  --  1.8  --   --   --   PHOS  --   --   --   --  4.1 4.4  < > = values in  this interval not displayed. Liver Function Tests:  Recent Labs Lab 11/09/13 0743 11/12/13 0644  ALBUMIN 2.9* 2.8*   CBC:  Recent Labs Lab 11/10/13 0845 11/12/13 0520  WBC 12.8* 11.1*  NEUTROABS 9.9* 8.3*  HGB 10.9* 10.0*  HCT 32.3* 30.4*  MCV 92.6 93.0  PLT 211 272   CBG:  Recent Labs Lab 11/10/13 2129 11/11/13 0752 11/11/13 1138 11/11/13 1603 11/11/13 2057 11/12/13 0727  GLUCAP 198* 152* 133* 189* 151* 148*    Micro Results: Recent Results (from the past 240 hour(s))  MRSA PCR SCREENING     Status: None   Collection Time    11/05/13  9:16 PM      Result Value Ref Range Status   MRSA by PCR NEGATIVE  NEGATIVE Final   Comment:            The GeneXpert MRSA Assay (FDA     approved for NASAL specimens  only), is one component of a     comprehensive MRSA colonization     surveillance program. It is not     intended to diagnose MRSA     infection nor to guide or     monitor treatment for     MRSA infections.   Medications: I have reviewed the patient's current medications. Scheduled Meds: . amiodarone  200 mg Oral Daily  . aspirin  81 mg Oral Daily  . atorvastatin  40 mg Oral q1800  . clopidogrel  75 mg Oral Q breakfast  . [START ON 11/14/2013] darbepoetin (ARANESP) injection - DIALYSIS  150 mcg Intravenous Q Wed-HD  . doxercalciferol  2 mcg Intravenous Q M,W,F-HD  . feeding supplement (RESOURCE BREEZE)  1 Container Oral TID BM  . insulin aspart  0-9 Units Subcutaneous TID WC  . pantoprazole (PROTONIX) IV  40 mg Intravenous BID  . sodium chloride  3 mL Intravenous Q12H  . vancomycin  125 mg Oral QID   Continuous Infusions: . phenylephrine (NEO-SYNEPHRINE) Adult infusion Stopped (11/05/13 2315)   PRN Meds:.sodium chloride, sodium chloride, acetaminophen, acetaminophen, alteplase, feeding supplement (NEPRO CARB STEADY), heparin, lidocaine (PF), lidocaine-prilocaine, loratadine, menthol-cetylpyridinium,  pentafluoroprop-tetrafluoroeth Assessment/Plan: Principal Problem:   Clostridium difficile colitis Active Problems:   End stage renal disease   DM (diabetes mellitus)   HTN (hypertension)   HLD (hyperlipidemia)   COPD (chronic obstructive pulmonary disease)   Aortic stenosis, moderate at cath 07/11/13   PAF (paroxysmal atrial fibrillation)   CAD- CABG X 3 09/2009, HSRA/DES to native RCA 07/11/13   Smoker   Atrial fibrillation   Chronic right-sided heart failure   Cardiac arrest   Sinus bradycardia   Aortic stenosis  Multifactorial Heart and Kidney Disease The patient has severe heart disease that is complicated by her ESRD. She appears to have trouble tolerating HD, with recent episodes of PEA arrest and SVT complicated by hypotension both occurring during HD. Given her severe AS, CAD, and multiple comorbidities, the patient was evaluated for interventions than may benefit her in terms of quality of life or longevity. The multidisciplinary valve team thoughtfully evaluated the patient for open AS repair combined with or without redo CABG as well as TAVR. It was felt that her comorbidities preclude her for open intervention. The risk of mortality as a results of TAVR was estimated at 50%. Thus, TAVR was determined to be not indicated for this patient. The TAVR team has recommended a palliative care consult to establish goals of care given her poor prognosis. On 3/12, I had a meeting with the patient and her son and brother. We discussed her code status. The patient elected to have a limited code (pressors and shocks OK, no CPR and no intubation).  C. Diff Colitis c/b acute blood loss anemia- Resolved The patients diarrhea has now resolved.. The patients colitis is likely due to C. Diff but could also have resulted after her episodes of hypotension. The bleeding is likely a result of this colitis. There have been no bloody BMs in the last 48 hours and her hemoglobin has been stable. - continue  po vancomycin 136m 4 times daily (on day 12 of 14) - Continue ASA and plavix given patients recent NSTEMI and known severe CAD  NSTEMI The patient had elevated troponin after becoming hypotensive on 3/9. Troponin peaked at 1.05 and down trended to .73 and .69. This is likely due to the patients known CAD. She has had multiple NSTEMI in the past. Appears stable at  this time. - Continue ASA and plavix  Severe Aortic Stenosis  It appears that the TAVR team does not believe that the patient would benefit from open AS repair with or without redo CABG. Further, the note states that TAVR is unlikely to provide significant improved QOL or length of life. Yesterday, Dr. Burt Knack met with patient and her family. The decision was made to not pursue TAVR as the expected mortality rate from TAVR is 50%.  - monitor volume status  - maintain MAP >60 with 250 cc fluid boluses.   ESRD - on MWF HD.  - HD per nephrology   Dispo: Discharge pending SNF placement.  The patient does have a current PCP (Imran Gertie Baron, MD) and does need an [facility] hospital follow-up appointment after discharge.  The patient does have transportation limitations that hinder transportation to clinic appointments.  .Services Needed at time of discharge: Y = Yes, Blank = No PT:   OT:   RN:   Equipment:   Other:     LOS: 14 days   Marrion Coy, MD 11/12/2013, 8:22 AM

## 2013-11-12 NOTE — Clinical Social Work Psychosocial (Signed)
Clinical Social Work Department  CLINICAL SOCIAL WORK PLACEMENT NOTE   Patient: [name]  Account Number: [id number]  Admit date: 10/29/13 Clinical Social Worker: Sabino NiemannAmy Richel Millspaugh LCSWA Date/time: 11/12/2013 12:30 AM  Clinical Social Work is seeking post-discharge placement for this patient at the following level of care: SKILLED NURSING (*CSW will update this form in Epic as items are completed)  11/12/2013 Patient/family provided with [facility] Department of Clinical Social Work's list of facilities offering this level of care within the geographic area requested by the patient (or if unable, by the patient's family).  11/12/2013 Patient/family informed of their freedom to choose among providers that offer the needed level of care, that participate in Medicare, Medicaid or managed care program needed by the patient, have an available bed and are willing to accept the patient.  11/12/2013 Patient/family informed of [facility]' ownership interest in [facility], as well as of the fact that they are under no obligation to receive care at this facility.  PASARR submitted to EDS on 11/12/2013 PASARR number received from EDS on 11/12/2013 FL2 transmitted to all facilities in geographic area requested by pt/family on 11/12/2013  FL2 transmitted to all facilities within larger geographic area on  Patient informed that his/her managed care company has contracts with or will negotiate with certain facilities, including the following:  Patient/family informed of bed offers received: 11/12/2013 [facility] Physician recommends and patient chooses bed at  Patient to be transferred to on 11/12/2013 [facility]  The following physician request were entered in Epic:  Additional Comments:

## 2013-11-12 NOTE — Progress Notes (Signed)
Clinical social worker assisted with [facility]  CSW addressed all family questions and concerns. CSW copied chart and added all important documents. CSW also set up patient transportation with [facility]. Clinical Social Worker will sign off for now as social work intervention is no longer needed.

## 2013-11-12 NOTE — Discharge Summary (Signed)
Name: [name] MRN: [id number] DOB: 1941/05/02 73 y.o. PCP: Shelbie Ammons, MD  Date of Admission: 10/29/2013  8:57 AM Date of Discharge: 11/12/2013 Attending Physician: Aletta Edouard, MD  Discharge Diagnosis:  Principal Problem:   Clostridium difficile colitis Active Problems:   End stage renal disease   DM (diabetes mellitus)   HTN (hypertension)   HLD (hyperlipidemia)   COPD (chronic obstructive pulmonary disease)   Aortic stenosis, moderate at cath 07/11/13   PAF (paroxysmal atrial fibrillation)   CAD- CABG X 3 09/2009, HSRA/DES to native RCA 07/11/13   Smoker   Atrial fibrillation   Chronic right-sided heart failure   Cardiac arrest   Sinus bradycardia   Aortic stenosis  Discharge Medications:   Medication List    STOP taking these medications       metoprolol tartrate 25 MG tablet  Commonly known as:  LOPRESSOR      TAKE these medications       amiodarone 200 MG tablet  Commonly known as:  PACERONE  Take 1 tablet (200 mg total) by mouth daily.     aspirin 81 MG chewable tablet  Chew 1 tablet (81 mg total) by mouth daily.     atorvastatin 40 MG tablet  Commonly known as:  LIPITOR  Take 1 tablet (40 mg total) by mouth daily at 6 PM.     clopidogrel 75 MG tablet  Commonly known as:  PLAVIX  Take 1 tablet (75 mg total) by mouth daily with breakfast.     insulin aspart 100 UNIT/ML injection  Commonly known as:  novoLOG  - Inject 2-10 Units into the skin See admin instructions. Sliding scale.  - 151-200 use 2 units; 201-250 use 4 units; 251-300 use 6 units; 301-350 use 8 units; 351-400 use 10 units     multivitamin Tabs tablet  Take 1 tablet by mouth at bedtime.     nitroGLYCERIN 0.4 MG SL tablet  Commonly known as:  NITROSTAT  Place 1 tablet (0.4 mg total) under the tongue every 5 (five) minutes x 3 doses as needed for chest pain.     vancomycin 50 mg/mL oral solution  Commonly known as:  VANCOCIN  Take 2.5 mLs (125 mg total) by mouth 4  (four) times daily.        Disposition and follow-up:   [name] was discharged from [facility] in Stable condition.  At the hospital follow up visit please address:  1.  ESRD on HD, Critical AS (medical management only, CAD (medical management only)  2.  Labs / imaging needed at time of follow-up: BMP  3.  Pending labs/ test needing follow-up: None  Follow-up Appointments: Follow-up Information   Follow up with Galvin Proffer, MD.   Specialty:  Internal Medicine   Contact information:   [address] [place] [address] [phone]       Follow up with Lennette Bihari, MD On 12/06/2013. (7:45 am)    Specialty:  Cardiology   Contact information:   [address] [place] [address] [phone]       Discharge Instructions:   Consultations: Treatment Team:  Cecille Aver, MD  Procedures Performed:  Dg Chest 1 View  10/27/2013   CLINICAL DATA:  Status post right thoracentesis  EXAM: CHEST - 1 VIEW  COMPARISON:  US THORACENTESIS ASP PLEURAL SPACE W/IMG GUIDE dated 10/27/2013  FINDINGS: Status post CABG. Moderate cardiac enlargement. Left lung is clear.  On the right side, there is a small, decreased right pleural effusion status post thoracentesis. There is no pneumothorax.  IMPRESSION: No pneumothorax   Electronically Signed   By: Esperanza Heir M.D.   On: 10/27/2013 12:07   Dg Chest Portable 1 View  10/29/2013   CLINICAL DATA:  Cardiopulmonary arrest. Hypotension. Shortness of breath.  EXAM: PORTABLE CHEST - 1 VIEW  COMPARISON:  10/27/2013  FINDINGS: Prior CABG. Moderately enlarged cardiopericardial silhouette with low lung volumes, small right pleural effusion, and interstitial accentuation. Low lung volumes. No pneumothorax or well-defined rib fracture.  IMPRESSION: 1. Cardiomegaly potentially with mild interstitial edema and a small right pleural effusion.   Electronically Signed   By: Herbie Baltimore M.D.   On:  10/29/2013 09:55   Dg Chest Port 1 View  10/24/2013   CLINICAL DATA:  Hemodialysis today.  Followup pleural effusion.  EXAM: PORTABLE CHEST - 1 VIEW  COMPARISON:  Q 11/16/2013  FINDINGS: Moderate to large pleural effusion on the right appears slightly larger than it did on the prior study.  No pulmonary edema. No convincing infiltrate. No left pleural effusion.  Changes from CABG surgery are stable. The cardiac silhouette is normal in size. Normal mediastinal contours.  IMPRESSION: 1. Moderate to large right pleural effusion has mildly increased from the prior study. No other change. No pulmonary edema or convincing infiltrate.   Electronically Signed   By: Amie Portland M.D.   On: 10/24/2013 19:41   Dg Chest Port 1 View  10/22/2013   CLINICAL DATA:  Chest pain  EXAM: PORTABLE CHEST - 1 VIEW  COMPARISON:  10/06/2013  FINDINGS: Cardiomegaly again noted. Status post CABG. Stable right pleural effusion with right basilar atelectasis or infiltrate. No pulmonary edema.  IMPRESSION: Cardiomegaly. Status post CABG. Stable right pleural effusion with right basilar atelectasis or infiltrate.   Electronically Signed   By: Natasha Mead M.D.   On: 10/22/2013 13:05   US Thoracentesis Asp Pleural Space W/img Guide  10/27/2013   CLINICAL DATA:  Shortness of breath, right-sided pleural effusion. Request therapeutic thoracentesis.  EXAM: ULTRASOUND GUIDED right THORACENTESIS  COMPARISON:  None.  FINDINGS: A total of approximately 1 L of clear, amber colored fluid was removed. A fluid sample was notsent for laboratory analysis.  IMPRESSION: Successful ultrasound guided right thoracentesis yielding 1 L of pleural fluid.  Read by: Brayton El PA-C  PROCEDURE: An ultrasound guided thoracentesis was thoroughly discussed with the patient and questions answered. The benefits, risks, alternatives and complications were also discussed. The patient understands and wishes to proceed with the procedure. Written consent was obtained.   Ultrasound was performed to localize and mark an adequate pocket of fluid in the right chest. The area was then prepped and draped in the normal sterile fashion. 1% Lidocaine was used for local anesthesia. Under ultrasound guidance a 19 gauge Yueh catheter was introduced. Thoracentesis was performed. The catheter was removed and a dressing applied.  Complications:  None immediate   Electronically Signed   By: Simonne Come M.D.   On: 10/27/2013 11:45   Admission HPI: The patient is a 73 yo woman, history of CAD s/p CABG, moderate-severe Aortic Stenosis, ESRD on MWF HD, pAfib, presenting with cardiac arrest. This morning, the patient was in her usual self when she presented for her regular HD session. After about 1 hour of HD, she became unresponsive, and when checked by a nurse and MD there, was found to be pulseless. CPR was initiated, and continued for about  10 minutes, followed by ROSC. An AED was connected during the code, but did not advise shock. No rhythm strip or EKG was printed. Following the event, the patient's BP was 165/65, with a pulse of 43. On arrival to our ED, the patient is hemodynamically stable, with a pulse in the 50's, noting no chest pain, palpitations, shortness of breath, or lightheadedness. The patient has no memory of the event, but is otherwise alert and oriented. Of note, the patient was recently discharged 2/28 after a hospitalization for atrial fibrillation, discharged on amiodarone and metoprolol. The patient's only symptom at present is bilateral "crampy" lower abdominal pain, which she notes has been intermittent over the last 3 months, with intermittent constipation and BRBPR during this time period, most recently today, with no NSAID or alcohol use.   Hospital Course by problem list: Principal Problem:   Clostridium difficile colitis Active Problems:   End stage renal disease   DM (diabetes mellitus)   HTN (hypertension)   HLD (hyperlipidemia)   COPD (chronic  obstructive pulmonary disease)   Aortic stenosis, moderate at cath 07/11/13   PAF (paroxysmal atrial fibrillation)   CAD- CABG X 3 09/2009, HSRA/DES to native RCA 07/11/13   Smoker   Atrial fibrillation   Chronic right-sided heart failure   Cardiac arrest   Sinus bradycardia   Aortic stenosis   Multifactorial heart and kidney disease [name] is a 73 year old ESRD patient on HD with multifactorial heart and kidney disease, including CAD s/p CABG x3 in 2011 and DES in 2014, moderate-severe aortic stenosis, and PAF for which she was hospitalized in February 2015, who presented on 10/29/13 after receiving 5 minutes of CPR with ROSC for an apparent cardiac arrest during her dialysis session.  In consultation with cardiology and electrophysiology, this event was considered a PEA arrest in the setting of severe AS, fluid shifts during dialysis, and possible hypovolemia secondary to diarrhea.  She was monitored on telemetry and kept on amiodarone 200mg  daily, and remained in sinus rhythm except as below.  She was evaluated by the multidisciplinary transcatheter aortic valve replacement team for consideration of TAVR, but due to the high risk of intra-operative mortality and the unclear benefit in terms of functionality or longevity, a decision was made not to proceed with this procedure.  She tolerated inpatient HD well until 3/9 when she had an additional cardiac event during dialysis with SVT vs atrial fibrillation in the 140s, NSTEMI with serial troponins of 1.05->0.73->0.69, and hypotension after receiving metoprolol 2.5mg .  She was transferred to the ICU and stabilized on a phenylephrine drip.  Since that time she tolerated inpatient HD well.  However, HD likely poses risk of additional cardiac events, and a meeting was held between the medical team, patient, and patient's family to discuss prognosis and code status.  The patient determined that if a cardiac event should occur, she would want any indicated  medical interventions and would want to be electrically cardioverted if necessary, but that she does not want intubation or chest compressions.  It was also determined that metoprolol should not be given in the future.  Code Status - Limited Code The patient has a limited code. No chest compression, no intubation. The patient responded yes for cardioversion, yes medications, and yes pressors.  C diff colitis with hematochezia The patient also presented with diarrhea on 3/3, and tested positive for C diff colitis.  Diarrhea initially improved on oral vancomycin, but recurrence of diarrhea with new hematochezia after the  patient's cardiac event on 3/9.  She remained hemodynamically stable and asymptomatic, with a benign abdominal exam, but her Hgb fell eventually requiring 2 transfusions of 3 total units.  Heparin prophylaxis was discontinued and a PPI was started; the patient's Hgb stabilized after initiation of these measures.  ASA and plavix were continued in the setting of an NSTEMI on 3/9.  Diabetes Mellitus, Type II: Patient was kept on sliding scale insulin, with fair to good control of her blood glucose.  Discharge Vitals:   BP 124/54  Pulse 65  Temp(Src) 99.3 F (37.4 C) (Oral)  Resp 20  Ht 5\' 4"  (1.626 m)  Wt 124 lb 9 oz (56.5 kg)  BMI 21.37 kg/m2  SpO2 100%  Discharge Labs:  Results for orders placed during the hospital encounter of 10/29/13 (from the past 24 hour(s))  GLUCOSE, CAPILLARY     Status: Abnormal   Collection Time    11/11/13 11:38 AM      Result Value Ref Range   Glucose-Capillary 133 (*) 70 - 99 mg/dL  GLUCOSE, CAPILLARY     Status: Abnormal   Collection Time    11/11/13  4:03 PM      Result Value Ref Range   Glucose-Capillary 189 (*) 70 - 99 mg/dL  GLUCOSE, CAPILLARY     Status: Abnormal   Collection Time    11/11/13  8:57 PM      Result Value Ref Range   Glucose-Capillary 151 (*) 70 - 99 mg/dL   Comment 1 Notify RN    CBC WITH DIFFERENTIAL     Status:  Abnormal   Collection Time    11/12/13  5:20 AM      Result Value Ref Range   WBC 11.1 (*) 4.0 - 10.5 K/uL   RBC 3.27 (*) 3.87 - 5.11 MIL/uL   Hemoglobin 10.0 (*) 12.0 - 15.0 g/dL   HCT 96.0 (*) 45.4 - 09.8 %   MCV 93.0  78.0 - 100.0 fL   MCH 30.6  26.0 - 34.0 pg   MCHC 32.9  30.0 - 36.0 g/dL   RDW 11.9 (*) 14.7 - 82.9 %   Platelets 272  150 - 400 K/uL   Neutrophils Relative % 75  43 - 77 %   Neutro Abs 8.3 (*) 1.7 - 7.7 K/uL   Lymphocytes Relative 6 (*) 12 - 46 %   Lymphs Abs 0.7  0.7 - 4.0 K/uL   Monocytes Relative 16 (*) 3 - 12 %   Monocytes Absolute 1.8 (*) 0.1 - 1.0 K/uL   Eosinophils Relative 3  0 - 5 %   Eosinophils Absolute 0.3  0.0 - 0.7 K/uL   Basophils Relative 0  0 - 1 %   Basophils Absolute 0.0  0.0 - 0.1 K/uL  RENAL FUNCTION PANEL     Status: Abnormal   Collection Time    11/12/13  6:44 AM      Result Value Ref Range   Sodium 125 (*) 137 - 147 mEq/L   Potassium 4.7  3.7 - 5.3 mEq/L   Chloride 86 (*) 96 - 112 mEq/L   CO2 22  19 - 32 mEq/L   Glucose, Bld 155 (*) 70 - 99 mg/dL   BUN 39 (*) 6 - 23 mg/dL   Creatinine, Ser 5.62 (*) 0.50 - 1.10 mg/dL   Calcium 8.9  8.4 - 13.0 mg/dL   Phosphorus 4.4  2.3 - 4.6 mg/dL   Albumin 2.8 (*) 3.5 - 5.2 g/dL  GFR calc non Af Amer 9 (*) >90 mL/min   GFR calc Af Amer 10 (*) >90 mL/min  GLUCOSE, CAPILLARY     Status: Abnormal   Collection Time    11/12/13  7:27 AM      Result Value Ref Range   Glucose-Capillary 148 (*) 70 - 99 mg/dL   Comment 1 Documented in Chart      Signed: Pleas Koch, MD 11/12/2013, 10:54 AM   Time Spent on Discharge: 45 minutes Services Ordered on Discharge: None Equipment Ordered on Discharge: None

## 2013-11-12 NOTE — Progress Notes (Signed)
OT Cancellation Note  Patient Details Name: [name] A Polizzi MRN: [id number] DOB: Oct 23, 1940   Cancelled Treatment:    Reason Eval/Treat Not Completed: Patient at procedure or test/ unavailable. Pt at hemodialysis, will re attempt later today as time allows/as appropriate  Galen ManilaSpencer, Simcha Farrington Jeanette, OT [phone] 11/12/2013, 10:24 AM

## 2013-11-12 NOTE — Telephone Encounter (Signed)
Dr Glendell DockerKomanski stated that he has spoken to Dr. Tresa EndoKelly about this patient and wanted to see if we can add her to his schedule before April 9th. She is post hospital.  TK

## 2013-11-12 NOTE — Progress Notes (Signed)
PT Cancellation Note  Patient Details Name: [name] MRN: [id number] DOB: 1941/08/15   Cancelled Treatment:    Reason Eval/Treat Not Completed: Patient declined, no reason specified  PT checked back to go walking with pt and she reported that she did not feel like it right now.  She is awaiting ambulance transfer to SNF.   PT to check back later if pt is still here.   Thanks,   Rollene Rotundaebecca B. Tylicia Sherman, PT, DPT [phone]   11/12/2013, 3:23 PM

## 2013-11-12 NOTE — Progress Notes (Signed)
D/c orders received;IV removed with gauze on, pt remains in stable condition, pt meds and instructions reviewed and given to pt; pt d/c to SNF, awaiting PTAR for transport; [facility]

## 2013-11-12 NOTE — Progress Notes (Signed)
Physical Therapy Treatment Patient Details Name: [name] MRN: [id number] DOB: Nov 14, 1940 Today's Date: 11/12/2013 Time: 3086-5784 PT Time Calculation (min): 18 min  PT Assessment / Plan / Recommendation  History of Present Illness Patient is a 73 year old woman with history of coronary artery disease status post CABG and PCI, recently hospitalized on the cardiology service with chest pain and rapid atrial fibrillation and started on amiodarone, moderate to severe aortic stenosis, end-stage renal disease on hemodialysis, atrial fibrillation, and other problems as outlined in the medical history, who reportedly became unresponsive and pulseless during hemodialysis today.   PT Comments   Pt is agreeable to work with therapy, but then lunch tray came in room and pt wanted to eat before going for a walk.  Pt needed hands on assist for safety during her transfer to the recliner chair.  Medical team worried about her managing at home and I agree that this is a concern, especially with limited family and friend support and her other medical and physical limitations.  Pt is now agreeable to SNF level rehab at d/c and PT feels this is appropriate.  PT will check back later as time allows to re-attempt ambulation.    Follow Up Recommendations  SNF     Does the patient have the potential to tolerate intense rehabilitation    NA  Barriers to Discharge   Limited assist at d/c      Equipment Recommendations  None recommended by PT    Recommendations for Other Services   None  Frequency Min 2X/week   Progress towards PT Goals Progress towards PT goals: Not progressing toward goals - comment (due to wanting to eat, will likely progress next session)  Plan Discharge plan needs to be updated;Frequency needs to be updated    Precautions / Restrictions Precautions Precautions: Fall Precaution Comments: h/o falls, contact (brown), pt reports she uses O2 at home PRN   Pertinent Vitals/Pain See vitals  flow sheet.     Mobility  Bed Mobility Overal bed mobility: Modified Independent Bed Mobility: Supine to Sit Supine to sit: Modified independent (Device/Increase time);HOB elevated (with railing) General bed mobility comments: Pt with heavy reliance on upper extremity support to get to sitting.  Significant extra time needed to complete task as pt struggles up to EOB.  Transfers Overall transfer level: Needs assistance Equipment used: 1 person hand held assist Transfers: Sit to/from [employer] Sit to Stand: Min guard Stand pivot transfers: Min guard General transfer comment: min guard assist to support trunk for balance, again heavy reliance on arms for balance and stability throughout transition to chair.  Pt refused gait due to wanting to eat lunch at this time.  PT to check back later for ambulation.        PT Goals (current goals can now be found in the care plan section) Acute Rehab PT Goals Patient Stated Goal: pt agreeable to SNF for rehab  Visit Information  Last PT Received On: 11/12/13 Assistance Needed: +1 History of Present Illness: Patient is a 72 year old woman with history of coronary artery disease status post CABG and PCI, recently hospitalized on the cardiology service with chest pain and rapid atrial fibrillation and started on amiodarone, moderate to severe aortic stenosis, end-stage renal disease on hemodialysis, atrial fibrillation, and other problems as outlined in the medical history, who reportedly became unresponsive and pulseless during hemodialysis today.    Subjective Data  Subjective: Pt reports that she has not eaten yet because she was in  dialysis.  "Girl, bring that food over here so I can eat" when asked if she was ready to go for a walk.   Patient Stated Goal: pt agreeable to SNF for rehab   Cognition  Cognition Arousal/Alertness: Awake/alert Behavior During Therapy: Agitated (due to being hungry) Overall Cognitive Status:  Within Functional Limits for tasks assessed    Balance  Balance Overall balance assessment: Needs assistance Sitting-balance support: Feet supported Sitting balance-Leahy Scale: Good Standing balance support: Bilateral upper extremity supported Standing balance-Leahy Scale: Poor Standing balance comment: relies on upper extremity support for balance when up on her feet  End of Session PT - End of Session Equipment Utilized During Treatment: Oxygen Activity Tolerance: Other (comment) (pt limited by hunger) Patient left: in chair;with call bell/phone within reach     [name], PT, DPT [phone]   11/12/2013, 2:18 PM

## 2013-11-13 NOTE — Telephone Encounter (Signed)
Dr. Tresa EndoKelly will need to be consulted on where to put her on the schedule if there's no openings.

## 2013-11-16 ENCOUNTER — Encounter (HOSPITAL_COMMUNITY): Payer: Self-pay | Admitting: *Deleted

## 2013-11-16 ENCOUNTER — Inpatient Hospital Stay (HOSPITAL_COMMUNITY)
Admission: AD | Admit: 2013-11-16 | Discharge: 2013-11-20 | DRG: 308 | Disposition: A | Discharge status: Skilled Nursing Facility | Payer: Medicare Other | Source: Other Acute Inpatient Hospital | Attending: Internal Medicine | Admitting: Internal Medicine

## 2013-11-16 DIAGNOSIS — J449 Chronic obstructive pulmonary disease, unspecified: Secondary | ICD-10-CM

## 2013-11-16 DIAGNOSIS — Z66 Do not resuscitate: Secondary | ICD-10-CM | POA: Diagnosis present

## 2013-11-16 DIAGNOSIS — R000 Tachycardia, unspecified: Secondary | ICD-10-CM | POA: Diagnosis present

## 2013-11-16 DIAGNOSIS — Z951 Presence of aortocoronary bypass graft: Secondary | ICD-10-CM

## 2013-11-16 DIAGNOSIS — R002 Palpitations: Secondary | ICD-10-CM

## 2013-11-16 DIAGNOSIS — I252 Old myocardial infarction: Secondary | ICD-10-CM

## 2013-11-16 DIAGNOSIS — E785 Hyperlipidemia, unspecified: Secondary | ICD-10-CM | POA: Diagnosis present

## 2013-11-16 DIAGNOSIS — I25810 Atherosclerosis of coronary artery bypass graft(s) without angina pectoris: Secondary | ICD-10-CM

## 2013-11-16 DIAGNOSIS — I2510 Atherosclerotic heart disease of native coronary artery without angina pectoris: Secondary | ICD-10-CM | POA: Diagnosis present

## 2013-11-16 DIAGNOSIS — Z992 Dependence on renal dialysis: Secondary | ICD-10-CM

## 2013-11-16 DIAGNOSIS — I4891 Unspecified atrial fibrillation: Principal | ICD-10-CM | POA: Diagnosis present

## 2013-11-16 DIAGNOSIS — J4489 Other specified chronic obstructive pulmonary disease: Secondary | ICD-10-CM | POA: Diagnosis present

## 2013-11-16 DIAGNOSIS — I5032 Chronic diastolic (congestive) heart failure: Secondary | ICD-10-CM | POA: Diagnosis present

## 2013-11-16 DIAGNOSIS — N186 End stage renal disease: Secondary | ICD-10-CM | POA: Diagnosis present

## 2013-11-16 DIAGNOSIS — I350 Nonrheumatic aortic (valve) stenosis: Secondary | ICD-10-CM | POA: Diagnosis present

## 2013-11-16 DIAGNOSIS — I120 Hypertensive chronic kidney disease with stage 5 chronic kidney disease or end stage renal disease: Secondary | ICD-10-CM

## 2013-11-16 DIAGNOSIS — J9819 Other pulmonary collapse: Secondary | ICD-10-CM | POA: Diagnosis present

## 2013-11-16 DIAGNOSIS — R5381 Other malaise: Secondary | ICD-10-CM | POA: Diagnosis present

## 2013-11-16 DIAGNOSIS — Z7982 Long term (current) use of aspirin: Secondary | ICD-10-CM

## 2013-11-16 DIAGNOSIS — Z794 Long term (current) use of insulin: Secondary | ICD-10-CM

## 2013-11-16 DIAGNOSIS — Z79899 Other long term (current) drug therapy: Secondary | ICD-10-CM

## 2013-11-16 DIAGNOSIS — E1129 Type 2 diabetes mellitus with other diabetic kidney complication: Secondary | ICD-10-CM | POA: Diagnosis present

## 2013-11-16 DIAGNOSIS — E119 Type 2 diabetes mellitus without complications: Secondary | ICD-10-CM

## 2013-11-16 DIAGNOSIS — I509 Heart failure, unspecified: Secondary | ICD-10-CM | POA: Diagnosis present

## 2013-11-16 DIAGNOSIS — I953 Hypotension of hemodialysis: Secondary | ICD-10-CM | POA: Diagnosis present

## 2013-11-16 DIAGNOSIS — I739 Peripheral vascular disease, unspecified: Secondary | ICD-10-CM | POA: Diagnosis present

## 2013-11-16 DIAGNOSIS — Z515 Encounter for palliative care: Secondary | ICD-10-CM

## 2013-11-16 DIAGNOSIS — Z8249 Family history of ischemic heart disease and other diseases of the circulatory system: Secondary | ICD-10-CM

## 2013-11-16 DIAGNOSIS — Z87891 Personal history of nicotine dependence: Secondary | ICD-10-CM

## 2013-11-16 DIAGNOSIS — R531 Weakness: Secondary | ICD-10-CM

## 2013-11-16 DIAGNOSIS — I10 Essential (primary) hypertension: Secondary | ICD-10-CM | POA: Diagnosis present

## 2013-11-16 DIAGNOSIS — D649 Anemia, unspecified: Secondary | ICD-10-CM | POA: Diagnosis present

## 2013-11-16 DIAGNOSIS — I2789 Other specified pulmonary heart diseases: Secondary | ICD-10-CM | POA: Diagnosis present

## 2013-11-16 DIAGNOSIS — R5383 Other fatigue: Secondary | ICD-10-CM

## 2013-11-16 DIAGNOSIS — Z8674 Personal history of sudden cardiac arrest: Secondary | ICD-10-CM

## 2013-11-16 DIAGNOSIS — I359 Nonrheumatic aortic valve disorder, unspecified: Secondary | ICD-10-CM | POA: Diagnosis present

## 2013-11-16 LAB — GLUCOSE, CAPILLARY
GLUCOSE-CAPILLARY: 202 mg/dL — AB (ref 70–99)
Glucose-Capillary: 82 mg/dL (ref 70–99)

## 2013-11-16 LAB — TROPONIN I: Troponin I: <0.3 ng/mL (ref <0.30)

## 2013-11-16 MED ORDER — ATORVASTATIN CALCIUM 40 MG PO TABS
40.0000 mg | ORAL_TABLET | Freq: Every day | ORAL | Status: DC
  Administered 2013-11-16 – 2013-11-19 (×4): 40 mg via ORAL
  Filled 2013-11-16 (×5): qty 1

## 2013-11-16 MED ORDER — CLOPIDOGREL BISULFATE 75 MG PO TABS
75.0000 mg | ORAL_TABLET | Freq: Every day | ORAL | Status: DC
  Administered 2013-11-17 – 2013-11-20 (×4): 75 mg via ORAL
  Filled 2013-11-16 (×5): qty 1

## 2013-11-16 MED ORDER — ACETAMINOPHEN 325 MG PO TABS
650.0000 mg | ORAL_TABLET | Freq: Four times a day (QID) | ORAL | Status: DC | PRN
  Administered 2013-11-16 – 2013-11-19 (×5): 650 mg via ORAL
  Filled 2013-11-16 (×4): qty 2

## 2013-11-16 MED ORDER — DOXERCALCIFEROL 4 MCG/2ML IV SOLN
INTRAVENOUS | Status: AC
  Filled 2013-11-16: qty 2

## 2013-11-16 MED ORDER — INSULIN ASPART 100 UNIT/ML ~~LOC~~ SOLN
0.0000 [IU] | Freq: Three times a day (TID) | SUBCUTANEOUS | Status: DC
  Administered 2013-11-16: 3 [IU] via SUBCUTANEOUS
  Administered 2013-11-17: 2 [IU] via SUBCUTANEOUS
  Administered 2013-11-17: 1 [IU] via SUBCUTANEOUS
  Administered 2013-11-17: 2 [IU] via SUBCUTANEOUS
  Administered 2013-11-18: 1 [IU] via SUBCUTANEOUS
  Administered 2013-11-18: 3 [IU] via SUBCUTANEOUS
  Administered 2013-11-20: 2 [IU] via SUBCUTANEOUS

## 2013-11-16 MED ORDER — AMIODARONE HCL 200 MG PO TABS
200.0000 mg | ORAL_TABLET | Freq: Every day | ORAL | Status: DC
  Administered 2013-11-16 – 2013-11-20 (×5): 200 mg via ORAL
  Filled 2013-11-16 (×5): qty 1

## 2013-11-16 MED ORDER — ACETAMINOPHEN 325 MG PO TABS
ORAL_TABLET | ORAL | Status: AC
  Filled 2013-11-16: qty 2

## 2013-11-16 MED ORDER — SODIUM CHLORIDE 0.9 % IJ SOLN
3.0000 mL | Freq: Two times a day (BID) | INTRAMUSCULAR | Status: DC
  Administered 2013-11-17 – 2013-11-20 (×6): 3 mL via INTRAVENOUS

## 2013-11-16 MED ORDER — DOXERCALCIFEROL 4 MCG/2ML IV SOLN
2.0000 ug | INTRAVENOUS | Status: DC
  Administered 2013-11-16 – 2013-11-19 (×2): 2 ug via INTRAVENOUS
  Filled 2013-11-16: qty 2

## 2013-11-16 MED ORDER — RENA-VITE PO TABS
1.0000 | ORAL_TABLET | Freq: Every day | ORAL | Status: DC
  Administered 2013-11-16: 1 via ORAL
  Administered 2013-11-17 – 2013-11-18 (×2): via ORAL
  Administered 2013-11-19: 1 via ORAL
  Filled 2013-11-16 (×5): qty 1

## 2013-11-16 MED ORDER — ASPIRIN 81 MG PO CHEW
81.0000 mg | CHEWABLE_TABLET | Freq: Every day | ORAL | Status: DC
  Administered 2013-11-16 – 2013-11-20 (×5): 81 mg via ORAL
  Filled 2013-11-16 (×5): qty 1

## 2013-11-16 MED ORDER — HEPARIN SODIUM (PORCINE) 5000 UNIT/ML IJ SOLN
5000.0000 [IU] | Freq: Three times a day (TID) | INTRAMUSCULAR | Status: DC
  Administered 2013-11-16 – 2013-11-19 (×8): 5000 [IU] via SUBCUTANEOUS
  Filled 2013-11-16 (×15): qty 1

## 2013-11-16 MED ORDER — ACETAMINOPHEN 650 MG RE SUPP: 650.0000 mg | Freq: Four times a day (QID) | RECTAL | Status: DC | PRN

## 2013-11-16 MED ORDER — DARBEPOETIN ALFA-POLYSORBATE 60 MCG/0.3ML IJ SOLN: 60.0000 ug | INTRAMUSCULAR | Status: DC

## 2013-11-16 NOTE — Progress Notes (Signed)
Hypoglycemic Event  CBG: 49  Treatment: carb snack  Symptoms: none  Follow-up CBG: Time: 23:42 CBG Result:53  Possible Reasons for Event: did not eat dinner  Comments/MD notified: yes    [place], Lavetta NielsenRebecca Annette  Remember to initiate Hypoglycemia Order Set & complete

## 2013-11-16 NOTE — Consult Note (Signed)
Subjective: 73 yo BF just dc  3/2- 3/16/ 15 [facility] / NSTEMI with known severe AOS ( not surg. Candidate)with CAD , also PAF [facility] after awoken with Palpitations and Chest pain. She did not make it to outpt hd / las ttx was wed on schedule after [facility] Monday [facility]. In room on [facility] Awoken from sleep no chest pain or palpitations. Feels she needs hd today  [facility] showing Pulmonary  Edma and first troponin negative. Objective Vital signs in last 24 hours: Filed Vitals:   11/16/13 1152 11/16/13 1230  BP: 122/39   Pulse: 65   Temp: 99 F (37.2 C)   Resp: 16   Height: 5\' 4"  (1.626 m) 5\' 4"  (1.626 m)  Weight: 54.704 kg (120 lb 9.6 oz) 54.477 kg (120 lb 1.6 oz)  SpO2: 100%   Exam:  NAD, calm , appropriate  Elderly BF positive jvd  l ungs= scattered rhonchi rales CARD= Irreg rhythm with rate 84, 3/6 SEM , no rub  Abd =soft, nt, nd  EXT= Right LE pedal edema 1+ wtiht dressings not removed from lower leg wounds?skin abrasions/ Left trace pedal edema LUA AV fistula pos. bruit   Dialysis: MWF [place]   4 hr, F160 53kg 3K/2.25  [facility] Prof 4 Heparin none LUA AVF  Hect 2 EPO 3000 OP labs: 10.5/24%/1600 pth 433   Assessment:  1 Chest pain with ho severe CAD Cabg with graft ds/ critical AOS (non surgical candidate) sp recent cardiac arrest = admit  team  Needs palliative care 2 Palpitations/ A fib/ rate controlled = admit team rx 3  Pulm onary edema= cxr at [facility]= hd today as bp allows / her edw at dc was 53kg and today wt= 54.5 attempt 2 liters 4  Critical AOS as above 5 ESRD  k = 4.6 at [facility]  6 Anemia / ho  LGIB / CKD- darbe 60/wk, transfused last admit, Hb 10.7 at  er 7 DM on insulin  8 COPD  9 2HPT cont vit D, no binder  10  Ho prior c diff= denies ^ stool 11 DNR =tells me only iv meds or fluid ''no cpr or electrical shock"/  Will call Palliative care to see her again as dw Dr. Marisue [facility]bleSanford.   CBG:  Recent  Labs Lab 11/11/13 1603 11/11/13 2057 11/12/13 0727 11/12/13 1315 11/16/13 1150  GLUCAP 189* 151* 148* 121* 82    Studies/Results: No results found. Medications:   . amiodarone  200 mg Oral Daily  . aspirin  81 mg Oral Daily  . atorvastatin  40 mg Oral q1800  . [START ON 11/17/2013] clopidogrel  75 mg Oral Q breakfast  . heparin  5,000 Units Subcutaneous 3 times per day  . insulin aspart  0-9 Units Subcutaneous TID WC  . multivitamin  1 tablet Oral QHS  . sodium chloride  3 mL Intravenous Q12H    Lenny Pastelavid Giabella Duhart, PA-C [facility] Beeper [phone] 11/16/2013,4:09 PM  LOS: 0 days

## 2013-11-16 NOTE — H&P (Signed)
Date: 11/16/2013               Patient Name:  [name] MRN: [id number]  DOB: 1941-01-07 Age / Sex: 73 y.o., female   PCP: Shelbie Ammons, MD         Medical Service: Internal Medicine Teaching Service         Attending Physician: Dr. Rogelia Boga    First Contact: Dr. Glendell Docker  Pager: [phone]  Second Contact: Dr. Burtis Junes Pager: [phone]       After Hours (After 5p/  First Contact Pager: [phone]  weekends / holidays): Second Contact Pager: [phone]   Chief Complaint: palpitations  History of Present Illness:  Ms. Estabrooks is a 73 year old woman with history of CAD s/p CABGx3, severe aortic stenosis, recent PEA arrest, ESRD on HD MWF, paroxysmal atrial fibrillation who presented to [facility] ED on morning of 3/20 with palpitations, transferred to [facility] for further management.   Patient states she began having palpitations this morning that awoke her from sleep.  Thought it was unusual that she was having them outside of dialysis so alerted her SNF nurse who called EMS.  Denies chest pain (during the episode or currently), shortness of breath, abdominal pain, lightheadedness.  She had some associated nausea earlier that has now resolved.  She recently had C. Difficile colitis but states her diarrhea has resolved, last BM was last night and formed.   Patient was recently admitted after cardiac arrest, discharged on 3/16.  See below, last admission H&P for further details.    Meds: No current facility-administered medications for this encounter.   Prescriptions prior to admission  Medication Sig Dispense Refill  . amiodarone (PACERONE) 200 MG tablet Take 1 tablet (200 mg total) by mouth daily.  30 tablet  0  . aspirin 81 MG chewable tablet Chew 1 tablet (81 mg total) by mouth daily.      Marland Kitchen atorvastatin (LIPITOR) 40 MG tablet Take 1 tablet (40 mg total) by mouth daily at 6 PM.  90 tablet  3  . clopidogrel (PLAVIX) 75 MG tablet Take 1 tablet (75 mg total) by mouth daily  with breakfast.  90 tablet  3  . insulin aspart (NOVOLOG) 100 UNIT/ML injection Inject 2-10 Units into the skin See admin instructions. Sliding scale. 151-200 use 2 units; 201-250 use 4 units; 251-300 use 6 units; 301-350 use 8 units; 351-400 use 10 units      . multivitamin (RENA-VIT) TABS tablet Take 1 tablet by mouth at bedtime.  90 tablet  3  . nitroGLYCERIN (NITROSTAT) 0.4 MG SL tablet Place 1 tablet (0.4 mg total) under the tongue every 5 (five) minutes x 3 doses as needed for chest pain.  25 tablet  2  . vancomycin (VANCOCIN) 50 mg/mL oral solution Take 2.5 mLs (125 mg total) by mouth 4 (four) times daily.  30 mL  0     Allergies: Allergies as of 11/16/2013 - Review Complete 11/07/2013  Allergen Reaction Noted  . Codeine Rash 01/11/2012  . Sulfa antibiotics Other (See Comments) 01/11/2012   Past Medical History  Diagnosis Date  . CHF (congestive heart failure)     diast SHF, RV failure, pulm HTN  . Diabetes mellitus   . ESRD on hemodialysis     HD MWF in [place]   . Hyperlipidemia   . Anemia   . Hypertension   . COPD (chronic obstructive pulmonary disease)     severe with FEV1 0.64 L  .  Peripheral vascular disease   . Coronary artery disease     Hx CABG. Cath 07/06/13 for CP episode showed occlusive graft disease and moderate AS; pt was not a candidate for repeat surgery and a complex PCI/rotablator/stenting procedure of calcified native RCA was done by cardiology  . PAF (paroxysmal atrial fibrillation)     recurrent  . Chronic right-sided heart failure   . Aortic stenosis    Past Surgical History  Procedure Laterality Date  . Coronary artery bypass graft  10/20/2009    x3 by Dr Maren BeachVanTrigt with LIMA to the LAD, a vein to the circumflex and a vien to the PDA.  Marland Kitchen. Abdominal hysterectomy    . Thoracentesis Left 12/17/2009  . Av fistula placement  01-12-2008    left upper arm  . Cardiac catheterization  10/15/2009    which showed low normal LV function with mild inferior  hypocontractility.  . Thoracentesis Right 10/27/2013   Family History  Problem Relation Age of Onset  . Heart disease Mother    History   Social History  . Marital Status: Widowed    Spouse Name: N/A    Number of Children: N/A  . Years of Education: N/A   Occupational History  . Retired    Social History Main Topics  . Smoking status: Former Smoker -- 0.50 packs/day    Types: Cigarettes    Start date: 06/19/1973  . Smokeless tobacco: Never Used  . Alcohol Use: No  . Drug Use: No  . Sexual Activity: No   Other Topics Concern  . Not on file   Social History Narrative   The patient currently lives at home with a friend "Trinna Postlex", who helps her around the house.  The patient manages her own medications, using a pill box.    Review of Systems: Review of Systems  Constitutional: Negative for fever.  Eyes: Negative for blurred vision.  Respiratory: Positive for cough. Negative for shortness of breath.        Mild, nonproductive  Cardiovascular: Positive for palpitations. Negative for chest pain and leg swelling.  Gastrointestinal: Positive for nausea. Negative for vomiting, abdominal pain, diarrhea, constipation and blood in stool.  Genitourinary: Negative for dysuria.  Musculoskeletal: Negative for falls and myalgias.  Neurological: Negative for dizziness, tingling, loss of consciousness, weakness and headaches.    Physical Exam: Blood pressure 122/39, pulse 65, temperature 99 F (37.2 C), resp. rate 16, height 5\' 4"  (1.626 m), weight 120 lb 1.6 oz (54.477 kg), SpO2 100.00%. General: alert, cooperative, and in no apparent distress HEENT: NCAT, vision grossly intact, oropharynx clear and non-erythematous  Neck: supple, no lymphadenopathy Lungs: diffuse crackles, normal work of respiration, no wheezes, ronchi Heart: regular rate and rhythm, no murmurs, gallops, or rubs Abdomen: soft, non-tender, non-distended, normal bowel sounds Extremities: 2+ DP/PT pulses bilaterally,  no cyanosis, clubbing, or edema Neurologic: alert & oriented X3, cranial nerves II-XII intact, strength grossly intact, sensation intact to light touch   CBG:  Recent Labs  11/16/13 1150  GLUCAP 82    Other results: EKG: pending ([facility] showed wide complex tachycardia with PVCs.)  Assessment & Plan by Problem: #Palpitations with history of paroxysmal atrial fibrillation- Discharged on 2/28 after admission for afib with RVR on amiodarone taper and metoprolol.  Patient is s/p [facility]rolol IV 10 [facility] ED today per patient's son.  Given episode of hypotension after receiving metoprolol at last admission, decision was made that metoprolol should not be given.  Currently  rate controlled with rate 65. -admit to IMTS telemetry -repeat EKG  -continue amiodarone 200 mg daily -cycle troponins x 2 more -PT consult  #ESRD on HD- Goes to HD MWF at [facility].  Stayed for full session on Wednesday 3/18 without complications per patient's son.  Concern for volume overload at this time given lung exam with crackles throughout; possible that episodes of atrial fibrillation (in addition to underlying aortic stenosis) trigger pulmonary edema.  Portable CXR at [facility] showed cardiomegaly without pulmonary edema though with persistent small right pleural effusion and mild basilar atelectasis.  Since last admission, patient has been having shorter HD sessions due to tachycardia during sessions given her severe aortic stenosis (pre-load dependence).  HD likely poses risks of additional cardiac events thus would pursue palliative care consult at this time (renal in agreement on this as well).  At last admission, family meeting was held to discuss prognosis and code status (see below).   -renal consult, appreciate recs -consider palliative care consult to determine goals of care, appreciate recs -repeat CXR 2 view -continue Renavite -daily weights -renal  function panel in AM  #Recent history of PEA arrest- Thought most likely to be bradycardic arrest (HR 43 after ROSC) vs. Arrhythmia (history of a fib, CAD, prolonged QT) on admission.  In consultation with cardiology, considered to be PEA arrest in the setting of severe AS, fluid shifts during dialysis, and possible hypovolemia secondary to diarrhea she was having at that time.   #CAD s/p CABGx3 (2011)- Chronic, also s/p cath in 06/2013 (DES to RCA).  No current chest pain.  Patient did have NSTEMI at last admission with serial troponins of 1.05, 0.73, 0.69.  Troponin negative x 1 this admission.   -repeat EKG -cycle troponins x 2 more -continue ASA, Plavix, statin  #Severe aortic stenosis- Echo 10/06/13 showed EF 50-60% but aortic valve area 0.45.  Decision made at last admission not to pursue transcatheter aortic valve replacement procedure due to high risk of intraoperative mortality.  -monitor volume status closely  #DM2- discharged on on Novolog 2-10 units per sliding scale.  -CBGs AC, qhs -SSI while inpatient  #HTN- stable, currently normotensive.  On amiodarone only per above.   #COPD- Stable.  No concern for COPD exacerbation at this time.  While patient reports ?increased cough, it is not productive and no wheezing on exam.   #DVT PPX- heparin subq TID  #Code status- DNI, partial code.  Patient's son [name], who is HCPOA, confirmed Ms. Colon preferences to have defibrillation and medical management as needed but no chest compressions or intubation.   Dispo: Disposition is deferred at this time, awaiting improvement of current medical problems. Anticipated discharge in approximately 1-2 day(s).   The patient does have a current PCP (Imran Clarnce Flock, MD) and does need an [facility] hospital follow-up appointment after discharge.   Signed: Rocco Serene, MD 11/16/2013, 1:31 PM

## 2013-11-17 ENCOUNTER — Inpatient Hospital Stay (HOSPITAL_COMMUNITY): Payer: Medicare Other

## 2013-11-17 LAB — GLUCOSE, CAPILLARY
GLUCOSE-CAPILLARY: 133 mg/dL — AB (ref 70–99)
GLUCOSE-CAPILLARY: 184 mg/dL — AB (ref 70–99)
GLUCOSE-CAPILLARY: 53 mg/dL — AB (ref 70–99)
Glucose-Capillary: 109 mg/dL — ABNORMAL HIGH (ref 70–99)
Glucose-Capillary: 148 mg/dL — ABNORMAL HIGH (ref 70–99)
Glucose-Capillary: 154 mg/dL — ABNORMAL HIGH (ref 70–99)
Glucose-Capillary: 49 mg/dL — ABNORMAL LOW (ref 70–99)
Glucose-Capillary: 60 mg/dL — ABNORMAL LOW (ref 70–99)

## 2013-11-17 LAB — RENAL FUNCTION PANEL
Albumin: 2.8 g/dL — ABNORMAL LOW (ref 3.5–5.2)
BUN: 13 mg/dL (ref 6–23)
CALCIUM: 8.7 mg/dL (ref 8.4–10.5)
CO2: 29 meq/L (ref 19–32)
Chloride: 94 mEq/L — ABNORMAL LOW (ref 96–112)
Creatinine, Ser: 2.88 mg/dL — ABNORMAL HIGH (ref 0.50–1.10)
GFR calc Af Amer: 18 mL/min — ABNORMAL LOW (ref >90)
GFR, EST NON AFRICAN AMERICAN: 16 mL/min — AB (ref >90)
Glucose, Bld: 218 mg/dL — ABNORMAL HIGH (ref 70–99)
PHOSPHORUS: 4.3 mg/dL (ref 2.3–4.6)
Potassium: 4.1 mEq/L (ref 3.7–5.3)
SODIUM: 136 meq/L — AB (ref 137–147)

## 2013-11-17 LAB — TROPONIN I

## 2013-11-17 NOTE — Evaluation (Signed)
Physical Therapy Evaluation Patient Details Name: [name] MRN: [id number] DOB: 04-16-1941 Today's Date: 11/17/2013 Time: [id number] PT Time Calculation (min): 46 min  PT Assessment / Plan / Recommendation History of Present Illness  [name] is a 73 year old woman with history of CAD s/p CABGx3, severe aortic stenosis, recent PEA arrest, ESRD on HD MWF, paroxysmal atrial fibrillation who presented [facility] ED on morning of 3/20 with palpitations, transferred to [facility] for further management[facility]   Patient states she began having palpitations this morning that awoke her from sleep.  Thought it was unusual that she was having them outside of dialysis so alerted her SNF nurse who called EMS. Patient was recently admitted after cardiac arrest, discharged on 3/16.   Clinical Impression  Patient presents with problems listed below.  Will benefit from acute PT to maximize independence prior to discharge.  Recommend patient return to SNF for continued therapy at discharge.    PT Assessment  Patient needs continued PT services    Follow Up Recommendations  SNF    Does the patient have the potential to tolerate intense rehabilitation      Barriers to Discharge        Equipment Recommendations  None recommended by PT    Recommendations for Other Services     Frequency Min 2X/week    Precautions / Restrictions Precautions Precautions: Fall Precaution Comments: h/o falls Restrictions Weight Bearing Restrictions: No   Pertinent Vitals/Pain       Mobility  Transfers Overall transfer level: Needs assistance Equipment used: Rolling walker (2 wheeled) Transfers: Sit to/from Stand Sit to Stand: Min assist General transfer comment: Verbal cues for hand placement and technique.  Assist to rise to standing and for balance initially.  Practiced x2 at bed and chair. Ambulation/Gait Ambulation/Gait assistance: Min assist Ambulation Distance (Feet): 80 Feet (80' x2  with sitting rest break between) Assistive device: Rolling walker (2 wheeled) Gait Pattern/deviations: Step-through pattern;Decreased stride length;Shuffle;Trunk flexed Gait velocity: very decreased Gait velocity interpretation: Below normal speed for age/gender General Gait Details: Verbal cues for safe use of RW.  Assist for balance/safety.    Exercises     PT Diagnosis: Difficulty walking;Abnormality of gait;Generalized weakness  PT Problem List: Decreased strength;Decreased activity tolerance;Decreased balance;Decreased mobility;Decreased knowledge of use of DME;Cardiopulmonary status limiting activity PT Treatment Interventions: DME instruction;Gait training;Functional mobility training;Therapeutic exercise;Patient/family education     PT Goals(Current goals can be found in the care plan section) Acute Rehab PT Goals Patient Stated Goal: To return to SNF for rehab PT Goal Formulation: With patient/family Time For Goal Achievement: 12/01/13 Potential to Achieve Goals: Fair  Visit Information  Last PT Received On: 11/17/13 Assistance Needed: +1 History of Present Illness: [name] is a 73 year old woman with history of CAD s/p CABGx3, severe aortic stenosis, recent PEA arrest, ESRD on HD MWF, paroxysmal atrial fibrillation who [facility] ED on morning of 3/20 with palpitations, [facility].   Patient states she began having palpitations this morning that awoke her from sleep.  Thought it was unusual that she was having them outside of dialysis so alerted her SNF nurse who called EMS. Patient was recently admitted after cardiac arrest, discharged on 3/16.        Prior Functioning  Home Living Family/patient expects to be discharged to:: Skilled nursing facility Home Equipment: Dan HumphreysWalker - 2 wheels Prior Function Level of Independence: Needs assistance Gait / Transfers Assistance Needed:  Using RW.    Communication Communication: No difficulties Dominant Hand: Right    Cognition  Cognition Arousal/Alertness: Awake/alert Behavior During Therapy: WFL for tasks assessed/performed Overall Cognitive Status: Within Functional Limits for tasks assessed    Extremity/Trunk Assessment Upper Extremity Assessment Upper Extremity Assessment: Generalized weakness Lower Extremity Assessment Lower Extremity Assessment: Generalized weakness Cervical / Trunk Assessment Cervical / Trunk Assessment: Kyphotic   Balance    End of Session PT - End of Session Equipment Utilized During Treatment: Gait belt Activity Tolerance: Patient limited by fatigue Patient left: in chair;with call bell/phone within reach;with family/visitor present Nurse Communication: Mobility status  GP     Vena Austria 11/17/2013, 5:55 PM Durenda Hurt. Renaldo Fiddler, [facility] Acute Rehab Services Pager [phone]

## 2013-11-17 NOTE — Progress Notes (Signed)
Subjective: Pt is feeling well this AM. NAEON. Pt did have HD with no noted complications. No frank diarrhea or frequent BMs per pt. No CP, SOB, or DOE. Pt is little upset this AM when approached with realization that HD is causing harm to the patient and that there is concern that treatments may not be able to be offered to the patient. Pt is still open to speaking to Palliative care doctors.   Objective: Vital signs in last 24 hours: Filed Vitals:   11/16/13 2319 11/17/13 0543 11/17/13 0810 11/17/13 1132  BP: 117/44 136/45 129/47 119/36  Pulse: 59 60 61 62  Temp: 97.9 F (36.6 C) 98.5 F (36.9 C) 98.6 F (37 C) 98.7 F (37.1 C)  TempSrc: Oral Oral Oral Oral  Resp: 16 17 18 18   Height:      Weight:      SpO2: 99% 100% 99% 98%   Weight change:   Intake/Output Summary (Last 24 hours) at 11/17/13 1152 Last data filed at 11/17/13 1041  Gross per 24 hour  Intake    463 ml  Output   2101 ml  Net  -1638 ml   General: sitting up in chair, NAD HEENT: NCAT, vision grossly intact, oropharynx clear and non-erythematous  Neck: supple, no lymphadenopathy Lungs: diffuse crackles, normal work of respiration, no wheezes, ronchi Heart: regular rate and rhythm, no murmurs, gallops, or rubs Abdomen: soft, non-tender, non-distended, normal bowel sounds  Extremities: 2+ DP/PT pulses bilaterally, no cyanosis, clubbing, or edema Neurologic: alert & oriented X3, cranial nerves II-XII intact, strength grossly intact, sensation intact to light touch  Lab Results: Basic Metabolic Panel:  Recent Labs Lab 11/12/13 0644  NA 125*  K 4.7  CL 86*  CO2 22  GLUCOSE 155*  BUN 39*  CREATININE 4.51*  CALCIUM 8.9  PHOS 4.4   Liver Function Tests:  Recent Labs Lab 11/12/13 0644  ALBUMIN 2.8*   CBC:  Recent Labs Lab 11/12/13 0520  WBC 11.1*  NEUTROABS 8.3*  HGB 10.0*  HCT 30.4*  MCV 93.0  PLT 272   Cardiac Enzymes:  Recent Labs Lab 11/16/13 1455 11/16/13 2320  TROPONINI  <0.30 <0.30   CBG:  Recent Labs Lab 11/16/13 1620 11/16/13 2326 11/16/13 2342 11/17/13 0007 11/17/13 0045 11/17/13 0808  GLUCAP 202* 49* 53* 60* 148* 133*   Micro Results: No results found for this or any previous visit (from the past 240 hour(s)). Studies/Results: Dg Chest 2 View  11/17/2013   CLINICAL DATA:  Follow-up right pleural effusion and associated consolidation in the right lower lobe.  EXAM: CHEST  2 VIEW  COMPARISON:  DG CHEST PORTABLE dated 11/16/2013; DG CHEST 1V PORT dated 10/29/2013; DG CHEST 1 VIEW dated 10/27/2013; DG CHEST PORTABLE dated 09/03/2013  FINDINGS: Prior sternotomy for CABG. Cardiac silhouette moderately enlarged but stable. Mild pulmonary venous hypertension without overt edema. Stable moderately large right pleural effusion and associated dense consolidation in the right middle lobe and right lower lobe. Streaky airspace consolidation in the left upper lobe, worse than on yesterday's examination. No left pleural effusion.  IMPRESSION: 1. Stable moderately large right pleural effusion and associated dense atelectasis and/or pneumonia in the right middle lobe and right lower lobe. 2. Worsening atelectasis/bronchovascular gas that worsening pneumonia involving the left upper lobe. 3. Stable moderate cardiomegaly. Pulmonary venous hypertension without overt edema.   Electronically Signed   By: Hulan Saashomas  Lawrence M.D.   On: 11/17/2013 01:05   Medications: I have reviewed the patient's current medications.  Scheduled Meds: . amiodarone  200 mg Oral Daily  . aspirin  81 mg Oral Daily  . atorvastatin  40 mg Oral q1800  . clopidogrel  75 mg Oral Q breakfast  . [START ON 11/21/2013] darbepoetin (ARANESP) injection - DIALYSIS  60 mcg Intravenous Q Wed-HD  . [START ON 11/19/2013] doxercalciferol  2 mcg Intravenous Q M,W,F-HD  . heparin  5,000 Units Subcutaneous 3 times per day  . insulin aspart  0-9 Units Subcutaneous TID WC  . multivitamin  1 tablet Oral QHS  . sodium  chloride  3 mL Intravenous Q12H   Continuous Infusions:  PRN Meds:.acetaminophen, acetaminophen Assessment/Plan: #Palpitations with history of paroxysmal atrial fibrillation-resolved- Discharged on 2/28 after admission for afib with RVR on amiodarone taper and metoprolol. Patient is s/p metoprolol IV 10 mg at [facility] ED today per patient's son. Given episode of hypotension after receiving metoprolol at last admission, decision was made that metoprolol should not be given. Currently rate controlled with rate 65.  -repeat EKG  -continue amiodarone 200 mg daily  -cycle troponins  -PT consult   #ESRD on HD- Goes to HD MWF at [facility]. Stayed for full session on Wednesday 3/18 without complications per patient's son. Concern for volume overload at this time given lung exam with crackles throughout; possible that episodes of atrial fibrillation (in addition to underlying aortic stenosis) trigger pulmonary edema. Portable CXR at [facility] showed cardiomegaly without pulmonary edema though with persistent small right pleural effusion and mild basilar atelectasis. Since last admission, patient has been having shorter HD sessions due to tachycardia during sessions given her severe aortic stenosis (pre-load dependence). HD likely poses risks of additional cardiac events thus would pursue palliative care consult at this time (renal in agreement on this as well). Pt still appears volume up but is having hard time tolerating HD and another short session maybe due on 3/21. -renal consult, appreciate recs  -palliative care consult- much needed as pt is not tolerating HD treatments and HCPOA would like to discuss goals and options -repeat CXR 2 view  -continue Renavite  -daily weights  -renal function panel in AM   #CAD s/p CABGx3 (2011)- Chronic, also s/p cath in 06/2013 (DES to RCA). No current chest pain. Patient did have NSTEMI at last admission with serial troponins of 1.05, 0.73,  0.69. Troponin negative x 1 this admission.  -repeat EKG -cycle troponins  -continue ASA, Plavix, statin   #Recent history of PEA arrest- Thought most likely to be bradycardic arrest (HR 43 after ROSC) vs. Arrhythmia (history of a fib, CAD, prolonged QT) on admission. In consultation with cardiology, considered to be PEA arrest in the setting of severe AS, fluid shifts during dialysis, and possible hypovolemia secondary to diarrhea she was having at that time.   #Severe aortic stenosis- Echo 10/06/13 showed EF 50-60% but aortic valve area 0.45. Decision made at last admission not to pursue transcatheter aortic valve replacement procedure due to high risk of intraoperative mortality.  -monitor volume status closely   #DM2- discharged on on Novolog 2-10 units per sliding scale.  -CBGs AC, qhs  -SSI while inpatient   #HTN- stable, currently normotensive.  #COPD- Stable. No concern for COPD exacerbation at this time. While patient reports ?increased cough, it is not productive and no wheezing on exam.  #DVT PPX- heparin subq TID  #Code status- DNI, partial code. Patient's son [name], who is HCPOA, confirmed Ms. Cerrato preferences to have defibrillation and medical management as needed but no  chest compressions or intubation.   Dispo: Disposition is deferred at this time, awaiting improvement of current medical problems.  Anticipated discharge in approximately 1 day(s).   The patient does have a current PCP (Imran Clarnce Flock, MD) and does not need an [facility] hospital follow-up appointment after discharge.  The patient does not have transportation limitations that hinder transportation to clinic appointments.  .Services Needed at time of discharge: Y = Yes, Blank = No PT:   OT:   RN:   Equipment:   Other:     LOS: 1 day   Christen Bame, MD 11/17/2013, 11:52 AM

## 2013-11-17 NOTE — Progress Notes (Signed)
Patient JX:BJYNW:[name]      DOB: 11/30/1940      GNF:[id number]  Consult for goals of care received.  Confirmed desire for our involvement with primary service.  Visited with patient who is upset that "people are just trying to kill her off".  Patient able to focus and given the opportunity to talk in the am.   Will assist with goals of care a soon as possible tomorrow.  Jakel Alphin L. Ladona Ridgelaylor, MD MBA The Palliative Medicine Team at [facility] Team Phone: [phone] Pager: [phone]

## 2013-11-17 NOTE — Progress Notes (Signed)
Hypoglycemic Event  CBG: 53  Treatment: carb snack  Symptoms: none  Follow-up CBG: Time: 00:45 CBG Result: 148  Possible Reasons for Event: no dinner, was in hemodialysis  Comments/MD notified: yes    Sparks, Lavetta NielsenRebecca Annette  Remember to initiate Hypoglycemia Order Set & complete

## 2013-11-17 NOTE — H&P (Signed)
  Date: 11/17/2013  Patient name: [name]  Medical record number: [id number]  Date of birth: 1940/10/02   I have seen and evaluated [name] and discussed their care with the Residency Team. Ms Heberlein has a complicated PMHx and recent course as documented by Dr Aundria Rudogers. She was admitted for palpitations that occurred at her SNF. She denies CP, dyspnea.   Assessment and Plan: I have seen and evaluated the patient as outlined above. I agree with the formulated Assessment and Plan as detailed in the residents' admission note, with the following changes:   1. Palpitations - She has a known H/O P A Fib tx with amiodarone. Metoprolol is CI 2/2 hypotension during HD. She is having no further palp.  2. ESRD - renal is managing HD sessions as she is poorly tolerating these but is volume overloaded.   3. AS, CAD - med mgmt only  4. End of life issues - this is key for this admission. She has not tolerated HD well 2/2 AS. She is not a surgical candidate to tx the AS. She has already been est as partial code - No CPR no intubation. Palliative Care has been consulted. In the meantime, cont to tx active issues.   Burns SpainElizabeth A Butcher, MD 3/21/20151:25 PM

## 2013-11-17 NOTE — Progress Notes (Signed)
[facility] Progress Note  Subjective:   Denies SOB or CP; coughing a lot - can't get stuff up  Objective Filed Vitals:   11/16/13 2230 11/16/13 2319 11/17/13 0543 11/17/13 0810  BP: 131/57 117/44 136/45 129/47  Pulse: 58 59 60 61  Temp: 97.8 F (36.6 C) 97.9 F (36.6 C) 98.5 F (36.9 C) 98.6 F (37 C)  TempSrc: Oral Oral Oral Oral  Resp: 18 16 17 18   Height:      Weight: 54.1 kg (119 lb 4.3 oz)     SpO2: 100% 99% 100% 99%   Physical Exam General: NAD nearly supine in bed; cough is weak Heart: irreg 3/ murmur Lungs: congested with rhonchi - does not clear with weak cough Abdomen: soft NT Extremities: + LE edema Dialysis Access: left AVF + bruit  Dialysis: MWF [place]  4 hr, F160 53kg 3K/2.25 caBath Prof 4 Heparin none LUA AVF  Hect 2 EPO 3000  OP labs: 10.5/24%/1600 pth 433   Assessment/Plan: 1 Chest pain with ho severe CAD Cabg with graft ds/ critical AS (non surgical candidate) sp recent cardiac arrest = admit team palliative care ordered 2 Palpitations/ A fib/ rate controlled = admit team rx  3 Pulm onary edema= cxr at [facility] - able to UF 2.1 L post HD weight 54.1; CXR about 2-3 hr post HD showed stable mod large pleural effusion and atx vs PNA in RML and RLL; streaky airspace consolidation in LUL worse than yesterday; pul venous HTN without overt edema; I think she needs volume down further; evaluate in am for extra tmt - if d/c home may need 4 day a week HD 4 ESRD MWF - HD yesterday - chemistries ordered for today 5 Anemia / ho LGIB / CKD- darbe 60/wk, transfused last admit, Hb 10.7 at [place] er  6 DM on insulin  7 COPD  8 2HPT cont vit D, 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 Partial DNR - palliative care to be consulted again  Sheffield Slider, PA-C [facility] Beeper [phone] 11/17/2013,11:26 AM  LOS: 1 day   Renal Attending: Agree with evaluation as articulated above without changes  needed. Ladoris Lythgoe C   Additional Objective Labs: Basic Metabolic Panel:  Recent Labs Lab 11/12/13 0644  NA 125*  K 4.7  CL 86*  CO2 22  GLUCOSE 155*  BUN 39*  CREATININE 4.51*  CALCIUM 8.9  PHOS 4.4   Liver Function Tests:  Recent Labs Lab 11/12/13 0644  ALBUMIN 2.8*   CBC:  Recent Labs Lab 11/12/13 0520  WBC 11.1*  NEUTROABS 8.3*  HGB 10.0*  HCT 30.4*  MCV 93.0  PLT 272  Cardiac Enzymes:  Recent Labs Lab 11/16/13 1455 11/16/13 2320  TROPONINI <0.30 <0.30   CBG:  Recent Labs Lab 11/16/13 2326 11/16/13 2342 11/17/13 0007 11/17/13 0045 11/17/13 0808  GLUCAP 49* 53* 60* 148* 133*  Studies/Results: Dg Chest 2 View  11/17/2013   CLINICAL DATA:  Follow-up right pleural effusion and associated consolidation in the right lower lobe.  EXAM: CHEST  2 VIEW  COMPARISON:  DG CHEST PORTABLE dated 11/16/2013; DG CHEST 1V PORT dated 10/29/2013; DG CHEST 1 VIEW dated 10/27/2013; DG CHEST PORTABLE dated 09/03/2013  FINDINGS: Prior sternotomy for CABG. Cardiac silhouette moderately enlarged but stable. Mild pulmonary venous hypertension without overt edema. Stable moderately large right pleural effusion and associated dense consolidation in the right middle lobe and right lower lobe. Streaky airspace  consolidation in the left upper lobe, worse than on yesterday's examination. No left pleural effusion.  IMPRESSION: 1. Stable moderately large right pleural effusion and associated dense atelectasis and/or pneumonia in the right middle lobe and right lower lobe. 2. Worsening atelectasis/bronchovascular gas that worsening pneumonia involving the left upper lobe. 3. Stable moderate cardiomegaly. Pulmonary venous hypertension without overt edema.   Electronically Signed   By: Hulan Saashomas  Lawrence M.D.   On: 11/17/2013 01:05   Medications:   . amiodarone  200 mg Oral Daily  . aspirin  81 mg Oral Daily  . atorvastatin  40 mg Oral q1800  . clopidogrel  75 mg Oral Q breakfast  . [START  ON 11/21/2013] darbepoetin (ARANESP) injection - DIALYSIS  60 mcg Intravenous Q Wed-HD  . [START ON 11/19/2013] doxercalciferol  2 mcg Intravenous Q M,W,F-HD  . heparin  5,000 Units Subcutaneous 3 times per day  . insulin aspart  0-9 Units Subcutaneous TID WC  . multivitamin  1 tablet Oral QHS  . sodium chloride  3 mL Intravenous Q12H

## 2013-11-18 DIAGNOSIS — R5383 Other fatigue: Secondary | ICD-10-CM

## 2013-11-18 DIAGNOSIS — R531 Weakness: Secondary | ICD-10-CM

## 2013-11-18 DIAGNOSIS — R5381 Other malaise: Secondary | ICD-10-CM

## 2013-11-18 DIAGNOSIS — Z515 Encounter for palliative care: Secondary | ICD-10-CM

## 2013-11-18 LAB — RENAL FUNCTION PANEL
Albumin: 2.7 g/dL — ABNORMAL LOW (ref 3.5–5.2)
BUN: 25 mg/dL — ABNORMAL HIGH (ref 6–23)
CO2: 26 mEq/L (ref 19–32)
Calcium: 8.6 mg/dL (ref 8.4–10.5)
Chloride: 89 mEq/L — ABNORMAL LOW (ref 96–112)
Creatinine, Ser: 4 mg/dL — ABNORMAL HIGH (ref 0.50–1.10)
GFR calc non Af Amer: 10 mL/min — ABNORMAL LOW (ref >90)
GFR, EST AFRICAN AMERICAN: 12 mL/min — AB (ref >90)
GLUCOSE: 149 mg/dL — AB (ref 70–99)
PHOSPHORUS: 5 mg/dL — AB (ref 2.3–4.6)
POTASSIUM: 3.8 meq/L (ref 3.7–5.3)
Sodium: 131 mEq/L — ABNORMAL LOW (ref 137–147)

## 2013-11-18 LAB — GLUCOSE, CAPILLARY
Glucose-Capillary: 88 mg/dL (ref 70–99)
Glucose-Capillary: 206 mg/dL — ABNORMAL HIGH (ref 70–99)
Glucose-Capillary: 147 mg/dL — ABNORMAL HIGH (ref 70–99)
Glucose-Capillary: 137 mg/dL — ABNORMAL HIGH (ref 70–99)

## 2013-11-18 NOTE — Progress Notes (Signed)
[facility] Progress Note  Subjective:   No c/o  Objective Filed Vitals:   11/18/13 0205 11/18/13 0207 11/18/13 0441 11/18/13 0829  BP: 129/49  136/4 144/52  Pulse: 57  60 59  Temp: 98.6 F (37 C)  97.9 F (36.6 C) 98 F (36.7 C)  TempSrc: Oral  Oral Oral  Resp: 16  16 18   Height:      Weight:  55.792 kg (123 lb)    SpO2: 92% 97% 100% 100%   Physical Exam General: NAD  On room air Heart: irreg irreg 3/6 murmur Lungs: dim with bilateral crackles Abdomen: soft Extremities: tr - 1 + LE edema Dialysis Access: left AVF + bruit   Dialysis: MWF [place]  4 hr, F160 53kg 3K/2.25 caBath Prof 4 Heparin none LUA AVF  Hect 2 EPO 3000  OP labs: 10.5/24%/1600 pth 433   Assessment/Plan:  1 Chest pain with ho severe CAD Cabg with graft ds/ critical AS (non surgical candidate) sp recent cardiac arrest = admit team palliative care ordered  2 Palpitations/ A fib/ rate controlled = admit team rx  3 Pulm onary edema= cxr at [facility] - able to UF 2.1 L post HD weight 54.1; CXR about 2-3 hr post HD showed stable mod large pleural effusion and atx vs PNA in RML and RLL; streaky airspace consolidation in LUL worse than yesterday; pul venous HTN without overt edema; I think she needs volume down further;HD first round on Monday - consider change to 4 day a week HD at discharge. 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 Partial DNR - palliative care reconsulted - pt not receptive  Sheffield Slider, PA-C [facility] Beeper [phone] 11/18/2013,10:08 AM  LOS: 2 days   Renal Attending: Above noted and agree with eval and mgmt.  She appears committed to there current treatment.  Denying cp currently.  Eleonor Ocon C    Additional Objective Labs: Basic  Metabolic Panel:  Recent Labs Lab 11/12/13 0644 11/17/13 1258  NA 125* 136*  K 4.7 4.1  CL 86* 94*  CO2 22 29  GLUCOSE 155* 218*  BUN 39* 13  CREATININE 4.51* 2.88*  CALCIUM 8.9 8.7  PHOS 4.4 4.3   Liver Function Tests:  Recent Labs Lab 11/12/13 0644 11/17/13 1258  ALBUMIN 2.8* 2.8*   CBC:  Recent Labs Lab 11/12/13 0520  WBC 11.1*  NEUTROABS 8.3*  HGB 10.0*  HCT 30.4*  MCV 93.0  PLT 272   Cardiac Enzymes:  Recent Labs Lab 11/16/13 1455 11/16/13 2320  TROPONINI <0.30 <0.30   CBG:  Recent Labs Lab 11/17/13 0808 11/17/13 1131 11/17/13 1635 11/17/13 2124 11/18/13 0827  GLUCAP 133* 184* 154* 109* 88  Studies/Results: Dg Chest 2 View  11/17/2013   CLINICAL DATA:  Follow-up right pleural effusion and associated consolidation in the right lower lobe.  EXAM: CHEST  2 VIEW  COMPARISON:  DG CHEST PORTABLE dated 11/16/2013; DG CHEST 1V PORT dated 10/29/2013; DG CHEST 1 VIEW dated 10/27/2013; DG CHEST PORTABLE dated 09/03/2013  FINDINGS: Prior sternotomy for CABG. Cardiac silhouette moderately enlarged but stable. Mild pulmonary venous hypertension without overt edema. Stable moderately large right pleural effusion and associated dense consolidation in the  right middle lobe and right lower lobe. Streaky airspace consolidation in the left upper lobe, worse than on yesterday's examination. No left pleural effusion.  IMPRESSION: 1. Stable moderately large right pleural effusion and associated dense atelectasis and/or pneumonia in the right middle lobe and right lower lobe. 2. Worsening atelectasis/bronchovascular gas that worsening pneumonia involving the left upper lobe. 3. Stable moderate cardiomegaly. Pulmonary venous hypertension without overt edema.   Electronically Signed   By: Hulan Saashomas  Lawrence M.D.   On: 11/17/2013 01:05   Medications:   . amiodarone  200 mg Oral Daily  . aspirin  81 mg Oral Daily  . atorvastatin  40 mg Oral q1800  . clopidogrel  75 mg Oral Q breakfast   . [START ON 11/21/2013] darbepoetin (ARANESP) injection - DIALYSIS  60 mcg Intravenous Q Wed-HD  . [START ON 11/19/2013] doxercalciferol  2 mcg Intravenous Q M,W,F-HD  . heparin  5,000 Units Subcutaneous 3 times per day  . insulin aspart  0-9 Units Subcutaneous TID WC  . multivitamin  1 tablet Oral QHS  . sodium chloride  3 mL Intravenous Q12H

## 2013-11-18 NOTE — Consult Note (Signed)
Patient [name]      DOB: Apr 20, 1941      AVW:[id number]     Consult Note from the Palliative Medicine Team at [facility]    Consult Requested by: Dr Rogelia Boga     PCP: Galvin Proffer, MD Reason for Consultation: Clarification of GOC and options     Phone Number:[phone]  Assessment of patients Current state: Ms. [name] is a 73 year old woman with history of CAD s/p CABGx3, severe aortic stenosis, recent PEA arrest, ESRD on HD MWF, paroxysmal atrial fibrillation who presented to [facility] ED on morning of 3/20 with palpitations, transferred to [facility] for further management.   Overall long term poor prognosis.  Patient is continually faced with advanced directive questions and anticipatory care needs.     She is "tired" of addressing the same questions "over and over"  This NP Lorinda Creed reviewed medical records, received report from team, assessed the patient and then meet at the patient's bedside and then spoke by telephone with her son [name]  to discuss diagnosis prognosis, GOC, EOL wishes disposition and options.   A  discussion was had  regarding advanced directives.  Concepts specific to code status, artifical feeding and hydration, continued IV antibiotics and rehospitalization was had.  The difference between a aggressive medical intervention path  and a palliative comfort care path for this patient at this time was had.  Values and goals of care important to patient and family were attempted to be elicited.  Concept of Hospice and Palliative Care were discussed  Natural trajectory and expectations at EOL were discussed.  Questions and concerns addressed.  Family encouraged to call with questions or concerns.  PMT will continue to support holistically.   Goals of Care: 1.  Code Status:  Partial   2. Scope of Treatment:  Patient verbalizes an understanding of her complex medical condition and overall poor prognosis.  She also clearly  verbalizes her desire for all offered and available medical conditions to prolong life  1. Dialysis: as long as it is an offered intervention she will continue "As long as she can" 2. Respiratory/Oxygen: yes 3. Antibiotics:yes 4. Blood Products:yes 5. IVF:yes 6. Labs:yes 7. Telemetry:yes    4. Disposition: Patient is open to return to SNF for continued rehabilitation   3. Symptom Management:   1. Weakness:  Continue with PT/OT, possible rehab on discahrge   4. Psychosocial:  Emotional support offered to patient at bedside.  She verbalizes frustration in feeling "that everyone wants her to just die".      Brief HPI:Ms. [name] is a 73 year old woman with history of CAD s/p CABGx3, severe aortic stenosis, recent PEA arrest, ESRD on HD MWF, paroxysmal atrial fibrillation who presented to [facility] ED on morning of 3/20 with palpitations, transferred to [facility] for further management. Overall long term poor prognosis   ROS: weakness, fatigue    PMH:  Past Medical History  Diagnosis Date  . CHF (congestive heart failure)     diast SHF, RV failure, pulm HTN  . Diabetes mellitus   . ESRD on hemodialysis     HD MWF in [place]   . Hyperlipidemia   . Anemia   . Hypertension   . COPD (chronic obstructive pulmonary disease)     severe with FEV1 0.64 L  . Peripheral vascular disease   . Coronary artery disease     Hx CABG. Cath 07/06/13 for CP episode showed occlusive graft  disease and moderate AS; pt was not a candidate for repeat surgery and a complex PCI/rotablator/stenting procedure of calcified native RCA was done by cardiology  . PAF (paroxysmal atrial fibrillation)     recurrent  . Chronic right-sided heart failure   . Aortic stenosis      PSH: Past Surgical History  Procedure Laterality Date  . Coronary artery bypass graft  10/20/2009    x3 by Dr Maren Beach with LIMA to the LAD, a vein to the circumflex and a vien to the PDA.  Marland Kitchen Abdominal  hysterectomy    . Thoracentesis Left 12/17/2009  . Av fistula placement  01-12-2008    left upper arm  . Cardiac catheterization  10/15/2009    which showed low normal LV function with mild inferior hypocontractility.  . Thoracentesis Right 10/27/2013   I have reviewed the FH and SH and  If appropriate update it with new information. Allergies  Allergen Reactions  . Codeine Rash  . Sulfa Antibiotics Other (See Comments)    unknown   Scheduled Meds: . amiodarone  200 mg Oral Daily  . aspirin  81 mg Oral Daily  . atorvastatin  40 mg Oral q1800  . clopidogrel  75 mg Oral Q breakfast  . [START ON 11/21/2013] darbepoetin (ARANESP) injection - DIALYSIS  60 mcg Intravenous Q Wed-HD  . [START ON 11/19/2013] doxercalciferol  2 mcg Intravenous Q M,W,F-HD  . heparin  5,000 Units Subcutaneous 3 times per day  . insulin aspart  0-9 Units Subcutaneous TID WC  . multivitamin  1 tablet Oral QHS  . sodium chloride  3 mL Intravenous Q12H   Continuous Infusions:  PRN Meds:.acetaminophen, acetaminophen    BP 144/52  Pulse 59  Temp(Src) 98 F (36.7 C) (Oral)  Resp 18  Ht 5\' 4"  (1.626 m)  Wt 55.792 kg (123 lb)  BMI 21.10 kg/m2  SpO2 100%   PPS:30 % at best   Intake/Output Summary (Last 24 hours) at 11/18/13 1112 Last data filed at 11/18/13 0858  Gross per 24 hour  Intake    600 ml  Output      1 ml  Net    599 ml    Physical Exam:  General: chronically ill appearing, NAD HEENT:  Mm, no exudate Chest:   Decreased in bases  CTA CVS: RRR Abdomen:soft NT +BS Ext: with out edema Neuro: alert and oriented X3 Psych: irritable, and agitated when discussion initiated regarding GOC   Labs: CBC    Component Value Date/Time   WBC 11.1* 11/12/2013 0520   RBC 3.27* 11/12/2013 0520   HGB 10.0* 11/12/2013 0520   HCT 30.4* 11/12/2013 0520   PLT 272 11/12/2013 0520   MCV 93.0 11/12/2013 0520   MCH 30.6 11/12/2013 0520   MCHC 32.9 11/12/2013 0520   RDW 15.8* 11/12/2013 0520   LYMPHSABS 0.7  11/12/2013 0520   MONOABS 1.8* 11/12/2013 0520   EOSABS 0.3 11/12/2013 0520   BASOSABS 0.0 11/12/2013 0520    BMET    Component Value Date/Time   NA 136* 11/17/2013 1258   K 4.1 11/17/2013 1258   CL 94* 11/17/2013 1258   CO2 29 11/17/2013 1258   GLUCOSE 218* 11/17/2013 1258   BUN 13 11/17/2013 1258   CREATININE 2.88* 11/17/2013 1258   CREATININE 4.32* 06/19/2013 1428   CALCIUM 8.7 11/17/2013 1258   GFRNONAA 16* 11/17/2013 1258   GFRAA 18* 11/17/2013 1258    CMP     Component Value Date/Time   NA  136* 11/17/2013 1258   K 4.1 11/17/2013 1258   CL 94* 11/17/2013 1258   CO2 29 11/17/2013 1258   GLUCOSE 218* 11/17/2013 1258   BUN 13 11/17/2013 1258   CREATININE 2.88* 11/17/2013 1258   CREATININE 4.32* 06/19/2013 1428   CALCIUM 8.7 11/17/2013 1258   PROT 7.2 10/29/2013 0944   ALBUMIN 2.8* 11/17/2013 1258   AST 27 10/29/2013 0944   ALT 20 10/29/2013 0944   ALKPHOS 157* 10/29/2013 0944   BILITOT 0.4 10/29/2013 0944   GFRNONAA 16* 11/17/2013 1258   GFRAA 18* 11/17/2013 1258     Time In Time Out Total Time Spent with Patient Total Overall Time  0830 0930 60 min 60 min    Greater than 50%  of this time was spent counseling and coordinating care related to the above assessment and plan.  Lorinda CreedMary Harshitha Fretz NP  Palliative Medicine Team Team Phone # [phone] Pager [phone]

## 2013-11-18 NOTE — Progress Notes (Signed)
Subjective: [name]. Patient states that she does not understand why the HD center does not want to keep giving her HD. I discussed with her the effects of HD on her hemodynamic stability. I explained that her heart is having trouble with the stress of HD. The patient voiced understanding. She states that she wants to continue HD until "god calls her".  Objective: Vital signs in last 24 hours: Filed Vitals:   11/18/13 0205 11/18/13 0207 11/18/13 0441 11/18/13 0829  BP: 129/49  136/4 144/52  Pulse: 57  60 59  Temp: 98.6 F (37 C)  97.9 F (36.6 C) 98 F (36.7 C)  TempSrc: Oral  Oral Oral  Resp: 16  16 18   Height:      Weight:  123 lb (55.792 kg)    SpO2: 92% 97% 100% 100%   Weight change: 2 lb 1.6 oz (0.953 kg)  Intake/Output Summary (Last 24 hours) at 11/18/13 1015 Last data filed at 11/18/13 0858  Gross per 24 hour  Intake    603 ml  Output      1 ml  Net    602 ml   General: sitting up in chair, NAD HEENT: NCAT, vision grossly intact, oropharynx clear and non-erythematous  Neck: supple, no lymphadenopathy Lungs: diffuse crackles, normal work of respiration, no wheezes, ronchi Heart: regular rate and rhythm, no murmurs, gallops, or rubs Abdomen: soft, non-tender, non-distended, normal bowel sounds  Extremities: 2+ DP/PT pulses bilaterally, no cyanosis, clubbing, or edema Neurologic: alert & oriented X3, cranial nerves II-XII intact, strength grossly intact, sensation intact to light touch  Lab Results: Basic Metabolic Panel:  Recent Labs Lab 11/12/13 0644 11/17/13 1258  NA 125* 136*  K 4.7 4.1  CL 86* 94*  CO2 22 29  GLUCOSE 155* 218*  BUN 39* 13  CREATININE 4.51* 2.88*  CALCIUM 8.9 8.7  PHOS 4.4 4.3   Liver Function Tests:  Recent Labs Lab 11/12/13 0644 11/17/13 1258  ALBUMIN 2.8* 2.8*   CBC:  Recent Labs Lab 11/12/13 0520  WBC 11.1*  NEUTROABS 8.3*  HGB 10.0*  HCT 30.4*  MCV 93.0  PLT 272   Cardiac Enzymes:  Recent Labs Lab  11/16/13 1455 11/16/13 2320  TROPONINI <0.30 <0.30   CBG:  Recent Labs Lab 11/17/13 0045 11/17/13 0808 11/17/13 1131 11/17/13 1635 11/17/13 2124 11/18/13 0827  GLUCAP 148* 133* 184* 154* 109* 88   Studies/Results: Dg Chest 2 View  11/17/2013   CLINICAL DATA:  Follow-up right pleural effusion and associated consolidation in the right lower lobe.  EXAM: CHEST  2 VIEW  COMPARISON:  DG CHEST PORTABLE dated 11/16/2013; DG CHEST 1V PORT dated 10/29/2013; DG CHEST 1 VIEW dated 10/27/2013; DG CHEST PORTABLE dated 09/03/2013  FINDINGS: Prior sternotomy for CABG. Cardiac silhouette moderately enlarged but stable. Mild pulmonary venous hypertension without overt edema. Stable moderately large right pleural effusion and associated dense consolidation in the right middle lobe and right lower lobe. Streaky airspace consolidation in the left upper lobe, worse than on yesterday's examination. No left pleural effusion.  IMPRESSION: 1. Stable moderately large right pleural effusion and associated dense atelectasis and/or pneumonia in the right middle lobe and right lower lobe. 2. Worsening atelectasis/bronchovascular gas that worsening pneumonia involving the left upper lobe. 3. Stable moderate cardiomegaly. Pulmonary venous hypertension without overt edema.   Electronically Signed   By: Hulan Saashomas  Lawrence M.D.   On: 11/17/2013 01:05   Medications: I have reviewed the patient's current medications. Scheduled Meds: . amiodarone  200 mg Oral Daily  . aspirin  81 mg Oral Daily  . atorvastatin  40 mg Oral q1800  . clopidogrel  75 mg Oral Q breakfast  . [START ON 11/21/2013] darbepoetin (ARANESP) injection - DIALYSIS  60 mcg Intravenous Q Wed-HD  . [START ON 11/19/2013] doxercalciferol  2 mcg Intravenous Q M,W,F-HD  . heparin  5,000 Units Subcutaneous 3 times per day  . insulin aspart  0-9 Units Subcutaneous TID WC  . multivitamin  1 tablet Oral QHS  . sodium chloride  3 mL Intravenous Q12H   Continuous Infusions:   PRN Meds:.acetaminophen, acetaminophen Assessment/Plan: #Palpitations with history of paroxysmal atrial fibrillation-resolved- Discharged on 2/28 after admission for afib with RVR on amiodarone and metoprolol. Given episode of hypotension after receiving metoprolol at last admission, decision was made that metoprolol should not be given. Currently rate controlled with rate 65.  -continue amiodarone 200 mg daily  - troponins negative -PT consult   #ESRD on HD- Patient is having difficulty complete HD sessions due to multifactorial heart disease (CAD s/p CABG c/b restonosis and numerous NSTEMI, Afibb w/ RVR, and severe AS. She appear volume overloaded at this time. However, removing fluid has been difficult due to tachycardia. -renal consult, appreciate recs  -palliative care consult- much needed as pt is not tolerating HD treatments and HCPOA would like to discuss goals and options -continue Renavite  -daily weights  -renal function panel in AM   #Severe aortic stenosis- Echo 10/06/13 showed EF 50-60% but aortic valve area 0.45. Decision made at last admission not to pursue transcatheter aortic valve replacement procedure due to high risk of intraoperative mortality.  -monitor volume status closely   #DM2- discharged on on Novolog 2-10 units per sliding scale.  -CBGs AC, qhs  -SSI while inpatient   #HTN- stable, currently normotensive.   #COPD- Stable. No concern for COPD exacerbation at this time. While patient reports increased cough, it is not productive and no wheezing on exam. Cough Likely due to volume overload (large effusion).  #DVT PPX- heparin subq TID   #Code status- DNI, partial code. Patient's son [name], who is HCPOA, confirmed Ms. Huxtable preferences to have defibrillation and medical management as needed but no chest compressions or intubation.   Dispo: Disposition is deferred at this time, awaiting improvement of current medical problems.  Anticipated discharge in  approximately 1 day(s).   The patient does have a current PCP (Imran Clarnce Flock, MD) and does not need an [facility] hospital follow-up appointment after discharge.  The patient does not have transportation limitations that hinder transportation to clinic appointments.  .Services Needed at time of discharge: Y = Yes, Blank = No PT:   OT:   RN:   Equipment:   Other:     LOS: 2 days   Pleas Koch, MD 11/18/2013, 10:15 AM

## 2013-11-19 ENCOUNTER — Ambulatory Visit: Payer: Medicare Other | Admitting: Internal Medicine

## 2013-11-19 DIAGNOSIS — N186 End stage renal disease: Secondary | ICD-10-CM | POA: Diagnosis present

## 2013-11-19 LAB — RENAL FUNCTION PANEL
Albumin: 2.7 g/dL — ABNORMAL LOW (ref 3.5–5.2)
BUN: 27 mg/dL — ABNORMAL HIGH (ref 6–23)
CO2: 25 mEq/L (ref 19–32)
Calcium: 8.7 mg/dL (ref 8.4–10.5)
Chloride: 90 mEq/L — ABNORMAL LOW (ref 96–112)
Creatinine, Ser: 4.32 mg/dL — ABNORMAL HIGH (ref 0.50–1.10)
GFR calc Af Amer: 11 mL/min — ABNORMAL LOW (ref >90)
GFR calc non Af Amer: 10 mL/min — ABNORMAL LOW (ref >90)
Glucose, Bld: 96 mg/dL (ref 70–99)
Phosphorus: 5.6 mg/dL — ABNORMAL HIGH (ref 2.3–4.6)
Potassium: 4.1 mEq/L (ref 3.7–5.3)
Sodium: 130 mEq/L — ABNORMAL LOW (ref 137–147)

## 2013-11-19 LAB — CBC
HCT: 30.1 % — ABNORMAL LOW (ref 36.0–46.0)
Hemoglobin: 9.8 g/dL — ABNORMAL LOW (ref 12.0–15.0)
MCH: 30.4 pg (ref 26.0–34.0)
MCHC: 32.6 g/dL (ref 30.0–36.0)
MCV: 93.5 fL (ref 78.0–100.0)
Platelets: 285 10*3/uL (ref 150–400)
RBC: 3.22 MIL/uL — ABNORMAL LOW (ref 3.87–5.11)
RDW: 15.6 % — ABNORMAL HIGH (ref 11.5–15.5)
WBC: 4.6 10*3/uL (ref 4.0–10.5)

## 2013-11-19 LAB — GLUCOSE, CAPILLARY
GLUCOSE-CAPILLARY: 82 mg/dL (ref 70–99)
GLUCOSE-CAPILLARY: 96 mg/dL (ref 70–99)
GLUCOSE-CAPILLARY: 226 mg/dL — AB (ref 70–99)
Glucose-Capillary: 195 mg/dL — ABNORMAL HIGH (ref 70–99)

## 2013-11-19 MED ORDER — NEPRO/CARBSTEADY PO LIQD: 237.0000 mL | ORAL | Status: DC | PRN

## 2013-11-19 MED ORDER — HEPARIN SODIUM (PORCINE) 1000 UNIT/ML DIALYSIS: 1000.0000 [IU] | INTRAMUSCULAR | Status: DC | PRN

## 2013-11-19 MED ORDER — ALTEPLASE 2 MG IJ SOLR
2.0000 mg | Freq: Once | INTRAMUSCULAR | Status: DC | PRN
  Filled 2013-11-19: qty 2

## 2013-11-19 MED ORDER — SODIUM CHLORIDE 0.9 % IV SOLN: 100.0000 mL | INTRAVENOUS | Status: DC | PRN

## 2013-11-19 MED ORDER — DOXERCALCIFEROL 4 MCG/2ML IV SOLN
INTRAVENOUS | Status: AC
  Administered 2013-11-19: 2 ug via INTRAVENOUS
  Filled 2013-11-19: qty 2

## 2013-11-19 MED ORDER — PENTAFLUOROPROP-TETRAFLUOROETH EX AERO: 1.0000 "application " | INHALATION_SPRAY | CUTANEOUS | Status: DC | PRN

## 2013-11-19 MED ORDER — LIDOCAINE-PRILOCAINE 2.5-2.5 % EX CREA: 1.0000 "application " | TOPICAL_CREAM | CUTANEOUS | Status: DC | PRN

## 2013-11-19 MED ORDER — WHITE PETROLATUM GEL
Status: AC
  Administered 2013-11-19: 19:00:00
  Filled 2013-11-19: qty 5

## 2013-11-19 MED ORDER — LIDOCAINE HCL (PF) 1 % IJ SOLN: 5.0000 mL | INTRAMUSCULAR | Status: DC | PRN

## 2013-11-19 NOTE — Procedures (Signed)
I was present at this dialysis session, have reviewed the session itself and made  appropriate changes  Vinson Moselleob Charnell Peplinski MD (pgr) [phone]    [phone] 11/19/2013, 12:08 PM

## 2013-11-19 NOTE — Progress Notes (Signed)
Subjective: [name]. Patient sitting comfortably in HD this morning.  Objective: Vital signs in last 24 hours: Filed Vitals:   11/19/13 0810 11/19/13 0833 11/19/13 0838 11/19/13 0900  BP: 148/73 143/62 141/67 136/63  Pulse: 59 61 68 62  Temp: 98.2 F (36.8 C)     TempSrc: Oral     Resp: 20     Height:      Weight: 123 lb 10.9 oz (56.1 kg)     SpO2: 100%      Weight change: 2 lb 4.8 oz (1.043 kg)  Intake/Output Summary (Last 24 hours) at 11/19/13 0944 Last data filed at 11/18/13 1830  Gross per 24 hour  Intake    480 ml  Output      3 ml  Net    477 ml   General: sitting up in chair, NAD HEENT: NCAT, vision grossly intact, oropharynx clear and non-erythematous  Neck: supple, no lymphadenopathy Lungs: diffuse crackles, normal work of respiration, no wheezes, ronchi Heart: regular rate and rhythm, no murmurs, gallops, or rubs Abdomen: soft, non-tender, non-distended, normal bowel sounds  Extremities: 2+ DP/PT pulses bilaterally, no cyanosis, clubbing, or edema Neurologic: alert & oriented X3, cranial nerves II-XII intact, strength grossly intact, sensation intact to light touch  Lab Results: Basic Metabolic Panel:  Recent Labs Lab 11/18/13 1950 11/19/13 0846  NA 131* 130*  K 3.8 4.1  CL 89* 90*  CO2 26 25  GLUCOSE 149* 96  BUN 25* 27*  CREATININE 4.00* 4.32*  CALCIUM 8.6 8.7  PHOS 5.0* 5.6*   Liver Function Tests:  Recent Labs Lab 11/18/13 1950 11/19/13 0846  ALBUMIN 2.7* 2.7*   CBC:  Recent Labs Lab 11/19/13 0846  WBC 4.6  HGB 9.8*  HCT 30.1*  MCV 93.5  PLT 285   Cardiac Enzymes:  Recent Labs Lab 11/16/13 1455 11/16/13 2320  TROPONINI <0.30 <0.30   CBG:  Recent Labs Lab 11/17/13 2124 11/18/13 0827 11/18/13 1137 11/18/13 1657 11/18/13 2106 11/19/13 0800  GLUCAP 109* 88 206* 147* 137* 82   Studies/Results: No results found. Medications: I have reviewed the patient's current medications. Scheduled Meds: . amiodarone  200 mg  Oral Daily  . aspirin  81 mg Oral Daily  . atorvastatin  40 mg Oral q1800  . clopidogrel  75 mg Oral Q breakfast  . [START ON 11/21/2013] darbepoetin (ARANESP) injection - DIALYSIS  60 mcg Intravenous Q Wed-HD  . doxercalciferol      . doxercalciferol  2 mcg Intravenous Q M,W,F-HD  . heparin  5,000 Units Subcutaneous 3 times per day  . insulin aspart  0-9 Units Subcutaneous TID WC  . multivitamin  1 tablet Oral QHS  . sodium chloride  3 mL Intravenous Q12H   Continuous Infusions:  PRN Meds:.sodium chloride, sodium chloride, acetaminophen, acetaminophen, alteplase, feeding supplement (NEPRO CARB STEADY), heparin, lidocaine (PF), lidocaine-prilocaine, pentafluoroprop-tetrafluoroeth Assessment/Plan:  #ESRD on HD- Patient is having difficulty complete HD sessions due to multifactorial heart disease (CAD s/p CABG c/b restonosis and numerous NSTEMI, Afibb w/ RVR, and severe AS. She appear volume overloaded at this time. However, removing fluid has been difficult due to tachycardia. -renal consult, appreciate recs  -palliative care consulted see note for details. Patient request to continue HD -continue Renavite  -daily weights   #Palpitations with history of paroxysmal atrial fibrillation-resolved- Given episode of hypotension after receiving metoprolol at last admission, decision was made that metoprolol should not be given. Currently rate controlled with rate 65.  -continue amiodarone 200 mg  daily  - troponins negative  #Severe aortic stenosis- Echo 10/06/13 showed EF 50-60% but aortic valve area 0.45. Decision made at last admission not to pursue transcatheter aortic valve replacement procedure due to high risk of intraoperative mortality.  -monitor volume status closely   #DM2- discharged on on Novolog 2-10 units per sliding scale.  -CBGs AC, qhs  -SSI while inpatient   #HTN- stable, currently normotensive.   #COPD- Stable. No concern for COPD exacerbation at this time. While patient  reports increased cough, it is not productive and no wheezing on exam. Cough Likely due to volume overload (large effusion).  #DVT PPX- heparin subq TID   #Code status- DNI, partial code. Patient's son [name], who is HCPOA, confirmed Ms. Brandner preferences to have defibrillation and medical management as needed but no chest compressions or intubation.   Dispo: Disposition is deferred at this time, awaiting improvement of current medical problems.  Anticipated discharge in approximately 1 day(s).   The patient does have a current PCP (Imran Clarnce Flock, MD) and does not need an [facility] hospital follow-up appointment after discharge.  The patient does not have transportation limitations that hinder transportation to clinic appointments.  .Services Needed at time of discharge: Y = Yes, Blank = No PT:   OT:   RN:   Equipment:   Other:     LOS: 3 days   Pleas Koch, MD 11/19/2013, 9:44 AM

## 2013-11-19 NOTE — Progress Notes (Signed)
Pt received full tx of dialysis 3 liter of fluid off.  Pt A&OX4. VSS. Pleasant.

## 2013-11-19 NOTE — Clinical Social Work Note (Signed)
CSW talked with admissions staff at [facility] and they can take patient back to continue her rehabilitation. CSW will facilitate d/c back to SNF and assist with transport.  Genelle BalVanessa Bairon Klemann, MSW, LCSW [phone]

## 2013-11-19 NOTE — Progress Notes (Signed)
    Day 3 of stay      Patient name: [name]  Medical record number: [id number]  Date of birth: 12/03/1940  Patient was in HD, comfortable when I visited her. She seemed depressed and denied any discussion about her ailments with me stating, "everyone keeps saying the same things". Patient has had meetings with palliative care and decided to keep all possible interventions required to prolong her life, including HS as long as it is offered to her. After reviewing the chart from this time and last, I understand that her dialysis is difficult due to her severe heart issues which include severe AS, multiple NSTEMIs, CABG, arrest while on HD, SVT and severe hypotension requiring pressors last admission while on HD and several bouts of chest pain and palpitations while on HD. I have reviewed Dr Louann LivKomanski's note and I agree with the documentation. I think ultimately, with the patient not wanting to opt out of dialysis, we need to discuss this with nephrology, so they might reason things out with the patient if possible. Rest per Dr. Louann LivKomanski's note.   Manvi Guilliams 11/19/2013, 11:01 AM.

## 2013-11-19 NOTE — Progress Notes (Signed)
Subjective: on HD, no complaints  Filed Vitals:   11/19/13 1000 11/19/13 1030 11/19/13 1100 11/19/13 1130  BP: 123/52 117/53 170/64 177/69  Pulse: 63 64 67 72  Temp:      TempSrc:      Resp:      Height:      Weight:      SpO2:       Exam: No distress, calm, alert Mild jvd Irreg irreg 3/6 murmur Chest bibasilar crackles Abd soft, no ascites, nt 1+ pretibial edema bilat LE L AVF + bruit Neuro is nf, ox3   Dialysis: MWF [place] 4h  F160  53kg  3K/2.25 Bath  Prof 4  Heparin none   LUA AVF  Hectorol 2     Epo 3000 OP labs: 10.5/24%/1600   pth 433      Assessment: 1 Palpitations / PAF 2 Vol excess / pleural effusions- still ^vol 3 Recent cardiac arrest 4 Recent Cdif 5 Severe AS not candidate for procedures 6 Hx CABG w graft disease on last cath, medical Rx 7 Limited code (no CPR, no intubation) 8 COPD, severe 9 Anemia Hb 9.6 darbe 60/wk here   Plan- HD today, will increase time to 4.5 hours if pt will agree. We have not recommended stopping dialysis at this point.  Limited options overall, pt wants to continue medical care. She is DNR except for pressors and meds.     Cassie Moselleob Kataleah Bejar MD  pager [phone]    cell [phone]  11/19/2013, 12:09 PM     Recent Labs Lab 11/17/13 1258 11/18/13 1950 11/19/13 0846  NA 136* 131* 130*  K 4.1 3.8 4.1  CL 94* 89* 90*  CO2 29 26 25   GLUCOSE 218* 149* 96  BUN 13 25* 27*  CREATININE 2.88* 4.00* 4.32*  CALCIUM 8.7 8.6 8.7  PHOS 4.3 5.0* 5.6*    Recent Labs Lab 11/17/13 1258 11/18/13 1950 11/19/13 0846  ALBUMIN 2.8* 2.7* 2.7*    Recent Labs Lab 11/19/13 0846  WBC 4.6  HGB 9.8*  HCT 30.1*  MCV 93.5  PLT 285   . amiodarone  200 mg Oral Daily  . aspirin  81 mg Oral Daily  . atorvastatin  40 mg Oral q1800  . clopidogrel  75 mg Oral Q breakfast  . [START ON 11/21/2013] darbepoetin (ARANESP) injection - DIALYSIS  60 mcg Intravenous Q Wed-HD  . doxercalciferol  2 mcg Intravenous Q M,W,F-HD  . heparin  5,000 Units  Subcutaneous 3 times per day  . insulin aspart  0-9 Units Subcutaneous TID WC  . multivitamin  1 tablet Oral QHS  . sodium chloride  3 mL Intravenous Q12H     sodium chloride, sodium chloride, acetaminophen, acetaminophen, alteplase, feeding supplement (NEPRO CARB STEADY), heparin, lidocaine (PF), lidocaine-prilocaine, pentafluoroprop-tetrafluoroeth

## 2013-11-19 NOTE — Consult Note (Signed)
I have reviewed this case with our NP and agree with the Assessment and Plan as stated.  Anacristina Steffek L. Deloyce Walthers, MD MBA The Palliative Medicine Team at [place] Team Phone: [phone] Pager: [phone]   

## 2013-11-20 LAB — GLUCOSE, CAPILLARY: Glucose-Capillary: 103 mg/dL — ABNORMAL HIGH (ref 70–99)

## 2013-11-20 NOTE — Progress Notes (Signed)
[facility] Progress Note  Subjective:   No problems with HD  Objective Filed Vitals:   11/19/13 1749 11/19/13 2010 11/20/13 0505 11/20/13 0934  BP: 133/41 127/38 127/50 129/48  Pulse: 64 64 62 60  Temp: 99.6 F (37.6 C) 98.7 F (37.1 C) 98.5 F (36.9 C) 98.9 F (37.2 C)  TempSrc: Oral Oral Oral Oral  Resp: 18 18 18 18   Height:  5\' 4"  (1.626 m)    Weight:  52.67 kg (116 lb 1.9 oz)    SpO2: 100% 100% 100% 96%   Physical Exam General:  NAD Heart: irreg irreg 3/6 murmur Lungs:  Crackles at bases Abdomen: soft NT Extremities: + edema Dialysis Access: left AVF + bruit aneurysmal - sites still slightly oozy today - dressing not removed  Dialysis: MWF [place]  4 hr, F160 53kg 3K/2.25 caBath Prof 4 Heparin none LUA AVF  Hect 2 EPO 3000  OP labs: 10.5/24%/1600 pth 433   Assessment/Plan:  1 Chest pain with ho severe CAD Cabg with graft ds/ critical AS (non surgical candidate) sp recent cardiac arrest = admit team palliative care ordered (4th admission since early Feb) 2 Palpitations/ A fib/ rate controlled = admit team rx  3 Pulm onary edema= cxr at [facility] - able to UF 2.1 L 3/20; UF 3 L Monday - consider change to 4 day a week HD at discharge - post HD weight Monday  52.2; continue to challenge volume at outpt center I spoke w Dr Hyman Hopes about extra HD.  He does not recommend 4x/wk dialysis and said that if fluid was an issue the HD time could be lengthened.  It was ^'d last admit from 3.5 > 4h.  Will increase to 4.5 hours.  Ultimately this is already palliative care as pt is not a surgical candidate for her heart disease and medical Rx is only Rx option left.   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  Disp - d/c today  Sheffield Slider, PA-C [facility] Beeper [phone] 11/20/2013,10:41 AM  LOS: 4 days   Pt seen, examined, agree w assess/plan as above with additions as indicated.  Vinson Moselle MD pager [phone]    cell [phone] 11/20/2013, 11:42 AM     Additional Objective Labs: Basic Metabolic Panel:  Recent Labs Lab 11/17/13 1258 11/18/13 1950 11/19/13 0846  NA 136* 131* 130*  K 4.1 3.8 4.1  CL 94* 89* 90*  CO2 29 26 25   GLUCOSE 218* 149* 96  BUN 13 25* 27*  CREATININE 2.88* 4.00* 4.32*  CALCIUM 8.7 8.6 8.7  PHOS 4.3 5.0* 5.6*   Liver Function Tests:  Recent Labs Lab 11/17/13 1258 11/18/13 1950 11/19/13 0846  ALBUMIN 2.8* 2.7* 2.7*  CBC:  Recent Labs Lab 11/19/13 0846  WBC 4.6  HGB 9.8*  HCT 30.1*  MCV 93.5  PLT 285  Cardiac Enzymes:  Recent Labs Lab 11/16/13 1455 11/16/13 2320  TROPONINI <0.30 <0.30   CBG:  Recent Labs Lab 11/19/13 0800 11/19/13 1321 11/19/13 1659 11/19/13 2003 11/20/13 0810  GLUCAP 82 96 195* 226* 103*   Medications:   . amiodarone  200 mg  Oral Daily  . aspirin  81 mg Oral Daily  . atorvastatin  40 mg Oral q1800  . clopidogrel  75 mg Oral Q breakfast  . [START ON 11/21/2013] darbepoetin (ARANESP) injection - DIALYSIS  60 mcg Intravenous Q Wed-HD  . doxercalciferol  2 mcg Intravenous Q M,W,F-HD  . heparin  5,000 Units Subcutaneous 3 times per day  . insulin aspart  0-9 Units Subcutaneous TID WC  . multivitamin  1 tablet Oral QHS  . sodium chloride  3 mL Intravenous Q12H

## 2013-11-20 NOTE — Progress Notes (Signed)
Subjective: NAEON. Tolerated HD w/o problems.   Objective: Vital signs in last 24 hours: Filed Vitals:   11/19/13 1324 11/19/13 1749 11/19/13 2010 11/20/13 0505  BP: 119/42 133/41 127/38 127/50  Pulse: 64 64 64 62  Temp: 98.2 F (36.8 C) 99.6 F (37.6 C) 98.7 F (37.1 C) 98.5 F (36.9 C)  TempSrc: Oral Oral Oral Oral  Resp: 16 18 18 18   Height:   5\' 4"  (1.626 m)   Weight:   116 lb 1.9 oz (52.67 kg)   SpO2: 94% 100% 100% 100%   Weight change: -1 lb 5.2 oz (-0.6 kg)  Intake/Output Summary (Last 24 hours) at 11/20/13 0718 Last data filed at 11/20/13 0214  Gross per 24 hour  Intake      0 ml  Output   3004 ml  Net  -3004 ml   General: sitting up in chair, NAD HEENT: NCAT, vision grossly intact, oropharynx clear and non-erythematous  Neck: supple, no lymphadenopathy Lungs: diffuse crackles, normal work of respiration, no wheezes, ronchi Heart: regular rate and rhythm, no murmurs, gallops, or rubs Abdomen: soft, non-tender, non-distended, normal bowel sounds  Extremities: 2+ DP/PT pulses bilaterally, no cyanosis, clubbing, or edema Neurologic: alert & oriented X3, cranial nerves II-XII intact, strength grossly intact, sensation intact to light touch  Lab Results: Basic Metabolic Panel:  Recent Labs Lab 11/18/13 1950 11/19/13 0846  NA 131* 130*  K 3.8 4.1  CL 89* 90*  CO2 26 25  GLUCOSE 149* 96  BUN 25* 27*  CREATININE 4.00* 4.32*  CALCIUM 8.6 8.7  PHOS 5.0* 5.6*   Liver Function Tests:  Recent Labs Lab 11/18/13 1950 11/19/13 0846  ALBUMIN 2.7* 2.7*   CBC:  Recent Labs Lab 11/19/13 0846  WBC 4.6  HGB 9.8*  HCT 30.1*  MCV 93.5  PLT 285   Cardiac Enzymes:  Recent Labs Lab 11/16/13 1455 11/16/13 2320  TROPONINI <0.30 <0.30   CBG:  Recent Labs Lab 11/18/13 1657 11/18/13 2106 11/19/13 0800 11/19/13 1321 11/19/13 1659 11/19/13 2003  GLUCAP 147* 137* 82 96 195* 226*   Studies/Results: No results found. Medications: I have reviewed  the patient's current medications. Scheduled Meds: . amiodarone  200 mg Oral Daily  . aspirin  81 mg Oral Daily  . atorvastatin  40 mg Oral q1800  . clopidogrel  75 mg Oral Q breakfast  . [START ON 11/21/2013] darbepoetin (ARANESP) injection - DIALYSIS  60 mcg Intravenous Q Wed-HD  . doxercalciferol  2 mcg Intravenous Q M,W,F-HD  . heparin  5,000 Units Subcutaneous 3 times per day  . insulin aspart  0-9 Units Subcutaneous TID WC  . multivitamin  1 tablet Oral QHS  . sodium chloride  3 mL Intravenous Q12H   Continuous Infusions:  PRN Meds:.acetaminophen, acetaminophen Assessment/Plan:  #ESRD on HD- Patient is having difficulty complete HD sessions as outpatient, but is compelted HD inpatient w/o complication. Her HD is made complicated by multifactorial heart disease (CAD s/p CABG c/b restonosis and numerous NSTEMI, Afibb w/ RVR, and severe AS. She appear mildly volume overloaded at this time.  -renal consult, appreciate recs  -palliative care consulted see note for details. Patient request to continue HD -continue Renavite  -daily weights   #Palpitations with history of paroxysmal atrial fibrillation-resolved- Given episode of hypotension after receiving metoprolol at last admission, decision was made that metoprolol should not be given. Currently rate controlled with rate 65.  -continue amiodarone 200 mg daily  - troponins negative  #Severe aortic stenosis-  Echo 10/06/13 showed EF 50-60% but aortic valve area 0.45. Decision made at last admission not to pursue transcatheter aortic valve replacement procedure or open valve replacement due to high risk of intraoperative mortality.  -monitor volume status closely   #DM2- discharged on on Novolog 2-10 units per sliding scale.  -CBGs AC, qhs  -SSI while inpatient   #HTN- stable, currently normotensive.   #COPD- Stable. No concern for COPD exacerbation at this time. While patient reports increased cough, it is not productive and no  wheezing on exam. Cough Likely due to volume overload (large effusion).  #DVT PPX- heparin subq TID   #Code status- DNI, partial code. Patient's son Everlean AlstromMaurice, who is HCPOA, confirmed Ms. Mena PaulsBrady's preferences to have defibrillation and medical management as needed but no chest compressions or intubation.   Dispo: Disposition is deferred at this time, awaiting improvement of current medical problems.  Anticipated discharge in approximately 1 day(s).   The patient does have a current PCP (Imran Clarnce FlockP Haque, MD) and does not [facility] follow-up appointment after discharge.  The patient does not have transportation limitations that hinder transportation to clinic appointments.  .Services Needed at time of discharge: Y = Yes, Blank = No PT:   OT:   RN:   Equipment:   Other:     LOS: 4 days   Pleas Kochhristopher Deaisha Welborn, MD 11/20/2013, 7:18 AM

## 2013-11-20 NOTE — Clinical Social Work Psychosocial (Addendum)
Clinical Social Work Department BRIEF PSYCHOSOCIAL ASSESSMENT 11/20/2013  Patient:  [name],[name]     Account Number:  [id number]     Admit date:  11/16/2013  Clinical Social Worker:  Delmer IslamRAWFORD,Tianah Lonardo, LCSW  Date/Time:  11/20/2013 02:13 AM  Referred by:  Physician  Date Referred:  11/19/2013 Referred for  SNF Placement   Other Referral:   Interview type:  Patient Other interview type:   CSW also talked with admissions director at [facility] regarding patient returning.    PSYCHOSOCIAL DATA Living Status:  FACILITY Admitted from [facility] Level of care:  Skilled Nursing Facility Primary support name:   Primary support relationship to patient:   Degree of support available:    CURRENT CONCERNS Current Concerns  Post-Acute Placement   Other Concerns:    SOCIAL WORK ASSESSMENT / PLAN [facility] and plans to return there at discharge. Patient expressed no concerns or had no questions for CSW regarding return to facility.   Assessment/plan status:  No Further Intervention Required Other assessment/ plan:   Information/referral to community resources:   None needed or requested at this time.    PATIENT'S/FAMILY'S RESPONSE TO PLAN OF CARE: Patient agreeable to return to SNF to continue rehabiliatation.  Patient discharged 3/24 back to [facility] [place] by family who was given patient's medical packet.

## 2013-11-20 NOTE — Discharge Instructions (Signed)
End-Stage Kidney Disease °The kidneys are two organs that lie on either side of the spine between the middle of the back and the front of the abdomen. The kidneys:  °· Remove wastes and extra water from the blood.   °· Produce important hormones. These help keep bones strong, regulate blood pressure, and help create red blood cells.   °· Balance the fluids and chemicals in the blood and tissues. °End-stage kidney disease occurs when the kidneys are so damaged that they cannot do their job. When the kidneys cannot do their job, life-threatening problems occur. The body cannot stay clean and strong without the help of the kidneys. In end-stage kidney disease, the kidneys cannot get better. You need a new kidney or treatments to do some of the work healthy kidneys do in order to stay alive. °CAUSES  °End-stage kidney disease usually occurs when a long-lasting (chronic) kidney disease gets worse. It may also occur after the kidneys are suddenly damaged (acute kidney injury).  °SYMPTOMS  °· Swelling (edema) of the legs, ankles, or feet.   °· Tiredness (lethargy).   °· Nausea or vomiting.   °· Confusion.   °· Problems with urination, such as:   °· Decreased urine production.   °· Frequent urination, especially at night.   °· Frequent accidents in children who are potty trained.   °· Muscle twitches and cramps.   °· Persistent itchiness.   °· Loss of appetite.   °· Headaches.   °· Abnormally dark or light skin.   °· Numbness in the hands or feet.   °· Easy bruising.   °· Frequent hiccups.   °· Menstruation stops. °DIAGNOSIS  °Your caregiver will measure your blood pressure and take some tests. These may include:  °· Urine tests.   °· Blood tests.   °· Imaging tests, such as:   °· An ultrasound exam.   °· Computed tomography (CT). °· A kidney biopsy. °TREATMENT  °There are two treatments for end-stage kidney disease:  °· A procedure that removes toxic wastes from the body (dialysis).   °· Receiving a new kidney (kidney  transplant). °Both of these treatments have serious risks and consequences. Your caregiver will help you determine which treatment is best for you based on your health, age, and other factors. In addition to having dialysis or a kidney transplant, you may need to take medicines to control high blood pressure (hypertension) and cholesterol and to decrease phosphorus levels in your blood.  °HOME CARE INSTRUCTIONS °· Follow your prescribed diet.   °· Only take over-the-counter or prescription medicines as directed by your caregiver.   °· Do not take any new medicines (prescription, over-the-counter, or nutritional supplements) unless approved by your caregiver. Many medicines can worsen your kidney damage or need to have the dose adjusted.   °· Keep all follow-up appointments. °MAKE SURE YOU: °· Understand these instructions. °· Will watch your condition. °· Will get help right away if you are not doing well or get worse °Document Released: 11/06/2003 Document Revised: 08/02/2012 Document Reviewed: 04/14/2012 °[employer]® Patient Information ©2014 [employer]. ° °

## 2013-11-20 NOTE — Discharge Summary (Signed)
Name: [name] MRN: [id number] DOB: 1940/10/06 73 y.o. PCP: Shelbie Ammons, MD  Date of Admission: 11/16/2013 11:47 AM Date of Discharge: 11/20/2013 Attending Physician: Aletta Edouard, MD  Discharge Diagnosis:  Principal Problem:   Atrial fibrillation Active Problems:   End stage renal disease   DM (diabetes mellitus)   HTN (hypertension)   COPD (chronic obstructive pulmonary disease)   Aortic stenosis, severe   Palliative care encounter   Weakness generalized   ESRD (end stage renal disease)  Discharge Medications:   Medication List    STOP taking these medications       vancomycin 50 mg/mL oral solution  Commonly known as:  VANCOCIN      TAKE these medications       acetaminophen 325 MG tablet  Commonly known as:  TYLENOL  Take 650 mg by mouth every 6 (six) hours as needed for moderate pain.     amiodarone 200 MG tablet  Commonly known as:  PACERONE  Take 1 tablet (200 mg total) by mouth daily.     aspirin 81 MG chewable tablet  Chew 1 tablet (81 mg total) by mouth daily.     atorvastatin 40 MG tablet  Commonly known as:  LIPITOR  Take 1 tablet (40 mg total) by mouth daily at 6 PM.     clopidogrel 75 MG tablet  Commonly known as:  PLAVIX  Take 1 tablet (75 mg total) by mouth daily with breakfast.     fexofenadine 180 MG tablet  Commonly known as:  ALLEGRA  Take 180 mg by mouth daily.     insulin aspart 100 UNIT/ML injection  Commonly known as:  novoLOG  - Inject 2-10 Units into the skin See admin instructions. Sliding scale.  - 151-200 use 2 units; 201-250 use 4 units; 251-300 use 6 units; 301-350 use 8 units; 351-400 use 10 units     multivitamin Tabs tablet  Take 1 tablet by mouth at bedtime.     nitroGLYCERIN 0.4 MG SL tablet  Commonly known as:  NITROSTAT  Place 1 tablet (0.4 mg total) under the tongue every 5 (five) minutes x 3 doses as needed for chest pain.        Disposition and follow-up:   [name] was discharged  from [facility] in Stable condition.  At the hospital follow up visit please address:  1.  ESRD, Atrial Fibrillation, C Diff Colitis  2.  Labs / imaging needed at time of follow-up: BMP, CBC  3.  Pending labs/ test needing follow-up: None  Follow-up Appointments:     Follow-up Information   Follow up with Kristie Cowman, MD On 11/22/2013. (10 am)    Specialty:  Internal Medicine   Contact information:   [address] [place] [address] [phone]       Discharge Instructions: Discharge Orders   Future Appointments Provider Department Dept Phone   11/22/2013 10:00 AM Manuela Schwartz, MD [facility] [phone]   12/06/2013 7:45 AM Lennette Bihari, MD [facility] [phone]   Future Orders Complete By Expires   Call MD for:  difficulty breathing, headache or visual disturbances  As directed    Call MD for:  persistant dizziness or light-headedness  As directed    Call MD for:  As directed    Comments:     New or worsening symptoms   Diet - low sodium heart healthy  As directed  Discharge instructions  As directed    Comments:     You have been treated for your volume overload due to ESRD. Please follow up with your follow up appointments from this admission and your previous admission.   Increase activity slowly  As directed       Consultations: Treatment Team:  Arita Missyan B Sanford, MD [facility]iative [facility]  Procedures Performed:  Dg Chest 1 View  10/27/2013   CLINICAL DATA:  Status post right thoracentesis  EXAM: CHEST - 1 VIEW  COMPARISON:  US THORACENTESIS ASP PLEURAL SPACE W/IMG GUIDE dated 10/27/2013  FINDINGS: Status post CABG. Moderate cardiac enlargement. Left lung is clear.  On the right side, there is a small, decreased right pleural effusion status post thoracentesis. There is no pneumothorax.  IMPRESSION: No pneumothorax   Electronically Signed   By: Esperanza Heiraymond  Rubner M.D.   On: 10/27/2013 12:07    Dg Chest 2 View  11/17/2013   CLINICAL DATA:  Follow-up right pleural effusion and associated consolidation in the right lower lobe.  EXAM: CHEST  2 VIEW  COMPARISON:  DG CHEST PORTABLE dated 11/16/2013; DG CHEST 1V PORT dated 10/29/2013; DG CHEST 1 VIEW dated 10/27/2013; DG CHEST PORTABLE dated 09/03/2013  FINDINGS: Prior sternotomy for CABG. Cardiac silhouette moderately enlarged but stable. Mild pulmonary venous hypertension without overt edema. Stable moderately large right pleural effusion and associated dense consolidation in the right middle lobe and right lower lobe. Streaky airspace consolidation in the left upper lobe, worse than on yesterday's examination. No left pleural effusion.  IMPRESSION: 1. Stable moderately large right pleural effusion and associated dense atelectasis and/or pneumonia in the right middle lobe and right lower lobe. 2. Worsening atelectasis/bronchovascular gas that worsening pneumonia involving the left upper lobe. 3. Stable moderate cardiomegaly. Pulmonary venous hypertension without overt edema.   Electronically Signed   By: Hulan Saashomas  Lawrence M.D.   On: 11/17/2013 01:05   Dg Chest Portable 1 View  10/29/2013   CLINICAL DATA:  Cardiopulmonary arrest. Hypotension. Shortness of breath.  EXAM: PORTABLE CHEST - 1 VIEW  COMPARISON:  10/27/2013  FINDINGS: Prior CABG. Moderately enlarged cardiopericardial silhouette with low lung volumes, small right pleural effusion, and interstitial accentuation. Low lung volumes. No pneumothorax or well-defined rib fracture.  IMPRESSION: 1. Cardiomegaly potentially with mild interstitial edema and a small right pleural effusion.   Electronically Signed   By: Herbie BaltimoreWalt  Liebkemann M.D.   On: 10/29/2013 09:55   Dg Chest Port 1 View  10/24/2013   CLINICAL DATA:  Hemodialysis today.  Followup pleural effusion.  EXAM: PORTABLE CHEST - 1 VIEW  COMPARISON:  Q 11/16/2013  FINDINGS: Moderate to large pleural effusion on the right appears slightly larger than it  did on the prior study.  No pulmonary edema. No convincing infiltrate. No left pleural effusion.  Changes from CABG surgery are stable. The cardiac silhouette is normal in size. Normal mediastinal contours.  IMPRESSION: 1. Moderate to large right pleural effusion has mildly increased from the prior study. No other change. No pulmonary edema or convincing infiltrate.   Electronically Signed   By: Amie Portlandavid  Ormond M.D.   On: 10/24/2013 19:41   Dg Chest Port 1 View  10/22/2013   CLINICAL DATA:  Chest pain  EXAM: PORTABLE CHEST - 1 VIEW  COMPARISON:  10/06/2013  FINDINGS: Cardiomegaly again noted. Status post CABG. Stable right pleural effusion with right basilar atelectasis or infiltrate. No pulmonary edema.  IMPRESSION: Cardiomegaly. Status post CABG. Stable right pleural effusion with right basilar atelectasis  or infiltrate.   Electronically Signed   By: Natasha Mead M.D.   On: 10/22/2013 13:05   US Thoracentesis Asp Pleural Space W/img Guide  10/27/2013   CLINICAL DATA:  Shortness of breath, right-sided pleural effusion. Request therapeutic thoracentesis.  EXAM: ULTRASOUND GUIDED right THORACENTESIS  COMPARISON:  None.  FINDINGS: A total of approximately 1 L of clear, amber colored fluid was removed. A fluid sample was notsent for laboratory analysis.  IMPRESSION: Successful ultrasound guided right thoracentesis yielding 1 L of pleural fluid.  Read by: Brayton El PA-C  PROCEDURE: An ultrasound guided thoracentesis was thoroughly discussed with the patient and questions answered. The benefits, risks, alternatives and complications were also discussed. The patient understands and wishes to proceed with the procedure. Written consent was obtained.  Ultrasound was performed to localize and mark an adequate pocket of fluid in the right chest. The area was then prepped and draped in the normal sterile fashion. 1% Lidocaine was used for local anesthesia. Under ultrasound guidance a 19 gauge Yueh catheter was  introduced. Thoracentesis was performed. The catheter was removed and a dressing applied.  Complications:  None immediate   Electronically Signed   By: Simonne Come M.D.   On: 10/27/2013 11:45    Admission HPI: [name] is a 73 year old woman with history of CAD s/p CABGx3, severe aortic stenosis, recent PEA arrest, ESRD on HD MWF, paroxysmal atrial fibrillation who presented to [facility] ED on morning of 3/20 with palpitations, transferred to [facility] for further management.  Patient states she began having palpitations this morning that awoke her from sleep. Thought it was unusual that she was having them outside of dialysis so alerted her SNF nurse who called EMS. Denies chest pain (during the episode or currently), shortness of breath, abdominal pain, lightheadedness. She had some associated nausea earlier that has now resolved. She recently had C. Difficile colitis but states her diarrhea has resolved, last BM was last night and formed.  Patient was recently admitted after cardiac arrest, discharged on 3/16. See below, last admission H&P for further details.   Hospital Course by problem list: Principal Problem:   Atrial fibrillation Active Problems:   End stage renal disease   DM (diabetes mellitus)   HTN (hypertension)   COPD (chronic obstructive pulmonary disease)   Aortic stenosis, severe   Palliative care encounter   Weakness generalized   ESRD (end stage renal disease)  See previous D/C summary for details regarding that admission.   Multifactorial heart and kidney disease  [name] is a 73 year old ESRD patient on HD with multifactorial heart and kidney disease, including CAD s/p CABG x3 in 2011 and DES in 2014, moderate-severe aortic stenosis, and PAF. The patient had an episode of SVT while in HD as outpatient. She was transferred to [facility] for palliative care consult. A palliative care consult was performed. It was explained that HD likely poses risk for  additional cardiac events. The patient voiced understanding and stated that she would like to continue HD. The patient determined that if a cardiac event should occur, she would want indicated medical interventions and would want to be electrically cardioverted if necessary, but that she does not want intubation or chest compressions. The patient tolerated HD as inpatient w/o problems. Dr. Arlean Hopping recommended continued HD as outpatient. Please contact Dr. Arlean Hopping regarding this if the outpatient HD team has questions.   Discharge Vitals:   BP 129/48  Pulse 60  Temp(Src) 98.9 F (  37.2 C) (Oral)  Resp 18  Ht 5\' 4"  (1.626 m)  Wt 116 lb 1.9 oz (52.67 kg)  BMI 19.92 kg/m2  SpO2 96%  Discharge Labs:  Results for orders placed during the hospital encounter of 11/16/13 (from the past 24 hour(s))  GLUCOSE, CAPILLARY     Status: None   Collection Time    11/19/13  1:21 PM      Result Value Ref Range   Glucose-Capillary 96  70 - 99 mg/dL  GLUCOSE, CAPILLARY     Status: Abnormal   Collection Time    11/19/13  4:59 PM      Result Value Ref Range   Glucose-Capillary 195 (*) 70 - 99 mg/dL  GLUCOSE, CAPILLARY     Status: Abnormal   Collection Time    11/19/13  8:03 PM      Result Value Ref Range   Glucose-Capillary 226 (*) 70 - 99 mg/dL  GLUCOSE, CAPILLARY     Status: Abnormal   Collection Time    11/20/13  8:10 AM      Result Value Ref Range   Glucose-Capillary 103 (*) 70 - 99 mg/dL    Signed: Pleas Koch, MD 11/20/2013, 10:20 AM   Time Spent on Discharge: 35 minutes Services Ordered on Discharge: None Equipment Ordered on Discharge: None

## 2013-11-21 LAB — GLUCOSE, CAPILLARY: Glucose-Capillary: 173 mg/dL — ABNORMAL HIGH (ref 70–99)

## 2013-11-22 ENCOUNTER — Encounter: Payer: Self-pay | Admitting: Internal Medicine

## 2013-11-22 ENCOUNTER — Ambulatory Visit (INDEPENDENT_AMBULATORY_CARE_PROVIDER_SITE_OTHER): Payer: Medicare Other | Admitting: Internal Medicine

## 2013-11-22 VITALS — BP 134/52 | HR 63 | Temp 97.4°F | Wt 120.2 lb

## 2013-11-22 DIAGNOSIS — E119 Type 2 diabetes mellitus without complications: Secondary | ICD-10-CM

## 2013-11-22 DIAGNOSIS — N186 End stage renal disease: Secondary | ICD-10-CM

## 2013-11-22 DIAGNOSIS — I359 Nonrheumatic aortic valve disorder, unspecified: Secondary | ICD-10-CM

## 2013-11-22 DIAGNOSIS — Z0000 Encounter for general adult medical examination without abnormal findings: Secondary | ICD-10-CM

## 2013-11-22 DIAGNOSIS — I350 Nonrheumatic aortic (valve) stenosis: Secondary | ICD-10-CM

## 2013-11-22 DIAGNOSIS — I10 Essential (primary) hypertension: Secondary | ICD-10-CM

## 2013-11-22 DIAGNOSIS — I4891 Unspecified atrial fibrillation: Secondary | ICD-10-CM

## 2013-11-22 LAB — BASIC METABOLIC PANEL
BUN: 12 mg/dL (ref 6–23)
CO2: 31 meq/L (ref 19–32)
CREATININE: 2.65 mg/dL — AB (ref 0.50–1.10)
Calcium: 9 mg/dL (ref 8.4–10.5)
Chloride: 96 mEq/L (ref 96–112)
Glucose, Bld: 159 mg/dL — ABNORMAL HIGH (ref 70–99)
Potassium: 4.4 mEq/L (ref 3.5–5.3)
Sodium: 138 mEq/L (ref 135–145)

## 2013-11-22 LAB — CBC
HEMATOCRIT: 29.8 % — AB (ref 36.0–46.0)
Hemoglobin: 9.5 g/dL — ABNORMAL LOW (ref 12.0–15.0)
MCH: 29.5 pg (ref 26.0–34.0)
MCHC: 31.9 g/dL (ref 30.0–36.0)
MCV: 92.5 fL (ref 78.0–100.0)
Platelets: 344 10*3/uL (ref 150–400)
RBC: 3.22 MIL/uL — AB (ref 3.87–5.11)
RDW: 15.2 % (ref 11.5–15.5)
WBC: 6.2 10*3/uL (ref 4.0–10.5)

## 2013-11-22 LAB — GLUCOSE, CAPILLARY: Glucose-Capillary: 147 mg/dL — ABNORMAL HIGH (ref 70–99)

## 2013-11-22 NOTE — Assessment & Plan Note (Deleted)
No palpitations or flutter today.  On amiodarone per Cardiology (Dr. Tresa EndoKelly).  F/u April 2015.

## 2013-11-22 NOTE — Consult Note (Signed)
I saw the patient and agree with the above assessment and plan.    Pt with many hospitalizations from and before outpt HD treatments for cardiac issues.  In some ways she is becoming increasingly unable to tolerate outpt HD.  Need to sit down with pt and address goals of care.

## 2013-11-22 NOTE — Assessment & Plan Note (Addendum)
Compliant with MWF session at [facility]. Still makes a very small amount of urine per pt report.

## 2013-11-22 NOTE — Assessment & Plan Note (Signed)
Lab Results  Component Value Date   HGBA1C 8.5* 10/06/2013   HGBA1C 8.5* 07/04/2013   HGBA1C  Value: 6.4 (NOTE) The ADA recommends the following therapeutic goal for glycemic control related to Hgb A1c measurement: Goal of therapy: <6.5 Hgb A1c  Reference: American Diabetes Association: Clinical Practice Recommendations 2010, Diabetes Care, 2010, 33: (Suppl  1).* 10/16/2009     Assessment: Diabetes control: fair control Progress toward A1C goal:  unchanged Comments: diet conteolled with SSI  Plan: Medications:  continue current medications Home glucose monitoring: Frequency:   Timing:   Instruction/counseling given: reminded to get eye exam Educational resources provided:   Self management tools provided:   Other plans: cont per SNF MD

## 2013-11-22 NOTE — Assessment & Plan Note (Signed)
BP Readings from Last 3 Encounters:  11/22/13 134/52  11/20/13 154/65  11/12/13 136/52    Lab Results  Component Value Date   NA 130* 11/19/2013   K 4.1 11/19/2013   CREATININE 4.32* 11/19/2013    Assessment: Blood pressure control: controlled Progress toward BP goal:  at goal Comments:   Plan: Medications:  on HD only, no meds Educational resources provided:   Self management tools provided:   Other plans: cont MWF HD per Renal

## 2013-11-22 NOTE — Progress Notes (Signed)
   Subjective:    Patient ID: [name], female    DOB: February 24, 1941, 73 y.o.   MRN: [id number]  HPI  73 yo female with hx significant for DM, hypertension, ESRD on HD ([place]-MWF), CAD s/p CABG, chronic CHF and paroxysmal atrial Fibrillation followed by Dr. Tresa Baker of Cardiology.  She recently was admitted after cardiac arrest during HD.  AED was applied but no shocks with ROSC after ~10 mins of compressions. Reports doing well since hospital discharge.  States that she does not want to come back to the clinic because it is too far from her SNF [facility]. States that she has had no further palpitations, or diarrhea. No shortness of breath, chest pain, headaches, dizziness or syncope. No dyuria with the small amount of urine that she makes.  Has been on HD x 7years and is due for next session tomorrow. She had Palliative Consult for Goals of Care and today has again endorsed no chest compression, no intubation but agrees to medications and cardioversion if needed. Her son who is present with her today is in agreement with this decision.  Review of Systems  Constitutional: Negative for fever and fatigue.  HENT: Positive for rhinorrhea.   Eyes: Negative for visual disturbance.  Respiratory: Negative for cough and shortness of breath.   Cardiovascular: Negative for chest pain and palpitations.  Gastrointestinal: Negative.   Genitourinary: Positive for dysuria.  Musculoskeletal: Negative.   Skin: Negative for rash.  Neurological: Negative for dizziness and headaches.  Hematological: Does not bruise/bleed easily.  Psychiatric/Behavioral: Negative.        Objective:   Physical Exam  Constitutional: She is oriented to person, place, and time. She appears well-developed and well-nourished. No distress.  Pleasant AA female, son present, uses cane in house  HENT:  Head: Normocephalic and atraumatic.  Right Ear: External ear normal.  Left Ear: External ear normal.  Nose: Mucosal  edema and rhinorrhea present. No epistaxis.  Mouth/Throat: Uvula is midline and oropharynx is clear and moist.  Eyes: Conjunctivae and EOM are normal. Pupils are equal, round, and reactive to light.  Neck: Normal range of motion. Neck supple. No thyromegaly present.  Cardiovascular: Normal rate and regular rhythm.   Murmur heard.  Systolic murmur is present with a grade of 5/6  Pulmonary/Chest: Effort normal and breath sounds normal.  Abdominal: Soft. Bowel sounds are normal. She exhibits no distension. There is no tenderness.  Musculoskeletal: She exhibits no edema.  Lymphadenopathy:    She has no cervical adenopathy.  Neurological: She is alert and oriented to person, place, and time.  Skin: Skin is warm and dry.  Psychiatric: She has a normal mood and affect. Her behavior is normal. Judgment and thought content normal.          Assessment & Plan:  See separate problem-based charting for detailed A/P:  1. ESRD on HD: compliant with regimen 2. Paroxysmal Afib: pt previously counseled on likelihood of recurrent arrhythmias with cont HD 3. CAD s/p CABG: no chet pain, f/u Dr. Tresa Baker scheduled in April 2015. 4. DM: HgbA1c 8.5, cont insulin therapy per SNF 5. Htn: at goal today 6. COPD: w/o exacerbation

## 2013-11-22 NOTE — Assessment & Plan Note (Signed)
No palpitations or flutter today.  On amiodarone 200 mg qd per Cardiology (Dr. Tresa EndoKelly).  F/u April 2015.

## 2013-11-22 NOTE — Patient Instructions (Signed)
General Instructions:  It was nice to meet you [name]. Continue your dialysis as scheduled. We checked you blood work today and will forward results to the Nursing Facility.  Treatment Goals:  Goals (1 Years of Data) as of 11/22/13   None      Progress Toward Treatment Goals:  Treatment Goal 11/22/2013  Hemoglobin A1C unchanged  Blood pressure at goal  Prevent falls at goal    Self Care Goals & Plans:  No flowsheet data found.  No flowsheet data found.   Care Management & Community Referrals:  No flowsheet data found.

## 2013-11-22 NOTE — Assessment & Plan Note (Addendum)
Previously patient of Dr. Sedalia Mutaox of [facility] [place] before admission to [facility]. No records available to document health maintenance/screenings.  Pt states that she will be due for colonoscopy (polyps removed a year ago per pt and told to return in 1 yr) and mammogram this year.  Will defer to [facility] for further care (including vaccination f/u) as pt declines [facility] visit with [facility] and there is a physician on staff Angelina Pih(Imran Hague, MD). -[facility] -route today's visit to [facility]

## 2013-11-22 NOTE — Discharge Summary (Addendum)
INTERNAL MEDICINE ATTENDING DISCHARGE COSIGN   I discussed the discharge plan with my resident team. I agree with the discharge documentation and disposition.   Demetrice Amstutz 11/22/2013, 3:10 PM

## 2013-11-26 ENCOUNTER — Telehealth: Payer: Self-pay | Admitting: *Deleted

## 2013-11-26 NOTE — Progress Notes (Signed)
Case discussed with Dr. Schooler soon after the resident saw the patient.  We reviewed the resident's history and exam and pertinent patient test results.  I agree with the assessment, diagnosis and plan of care documented in the resident's note. 

## 2013-12-06 ENCOUNTER — Other Ambulatory Visit: Payer: Self-pay

## 2013-12-06 ENCOUNTER — Ambulatory Visit (INDEPENDENT_AMBULATORY_CARE_PROVIDER_SITE_OTHER): Payer: Medicare Other | Admitting: Cardiovascular Disease

## 2013-12-06 ENCOUNTER — Encounter: Payer: Self-pay | Admitting: Cardiovascular Disease

## 2013-12-06 VITALS — BP 106/40 | HR 73 | Ht 64.0 in | Wt 116.8 lb

## 2013-12-06 DIAGNOSIS — N186 End stage renal disease: Secondary | ICD-10-CM

## 2013-12-06 DIAGNOSIS — I4891 Unspecified atrial fibrillation: Secondary | ICD-10-CM

## 2013-12-06 DIAGNOSIS — I2510 Atherosclerotic heart disease of native coronary artery without angina pectoris: Secondary | ICD-10-CM

## 2013-12-06 DIAGNOSIS — E119 Type 2 diabetes mellitus without complications: Secondary | ICD-10-CM

## 2013-12-06 DIAGNOSIS — I350 Nonrheumatic aortic (valve) stenosis: Secondary | ICD-10-CM

## 2013-12-06 DIAGNOSIS — I480 Paroxysmal atrial fibrillation: Secondary | ICD-10-CM

## 2013-12-06 DIAGNOSIS — I359 Nonrheumatic aortic valve disorder, unspecified: Secondary | ICD-10-CM

## 2013-12-06 DIAGNOSIS — I10 Essential (primary) hypertension: Secondary | ICD-10-CM

## 2013-12-06 DIAGNOSIS — E785 Hyperlipidemia, unspecified: Secondary | ICD-10-CM

## 2013-12-06 NOTE — Progress Notes (Signed)
Patient ID: [name], female   DOB: 1941/07/23, 73 y.o.   MRN: [id number]     HPI: [name] is a 73 y.o. female who presents for followup evaluation following evaluation following her recent hospitalizations in February and again in March 2015.  [name] has a history of end-stage renal disease, CAD, and aortic stenosis.  She suffered a non-ST segment elevation myocardial infarction in February, 2011. At that time, she underwent CABG surgery x3 by Dr. Morton PetersVan Tright and a LIMA was placed to the LAD, a vein to the circumflex, and a vein to the PDA. She did have very mild aortic stenosis at time of catheterization with a mean gradient of 10 mm. During her surgery, an intraoperative TEE confirmed mild AS and was not felt that this was severe enough to warrant replacement at that time. In August 2012 or aortic valve gradient had increased to 41 mm mean, with a peak gradient of 70. In November, she developed episodes of chest pain during dialysis as well as atrial fibrillation. She was transported to [place] from her dialysis and [place] in one 07/06/2013, cardiac catheterization revealed diffusely calcified severe disease involving the right artery with an occluded graft that supplied the PDA. The graft that supplied the obtuse marginal vessel was also occluded and the LIMA to the LAD was patent. She did have moderate aortic valve stenosis. She was turned down for reconsideration of repeat surgery. Consequently, 07/11/2013 I performed complex  HSRA of her severely calcified native RCA. This was very difficult but successful and ultimately tandem 3.0x38 mm DES stents were inserted into the native RCA with all sites being postdilated to 3.5 mm and with a site of residual extrinsic calcium being postdilated to 3.89 in the mid RCA.  Subsequently, she had felt markedly improved without chest pain. I have not seen her since her complex percutaneous cardiac intervention. She presents to the office today for  evaluation.  She has been hospitalized several additional times since this intervention in November.  In February she developed atrial flutter, rapid ventricular response in the setting of her severe aortic stenosis.  A repeat catheterization was done by Dr. Lavell LusterVernasi on 10/06/2013.  This revealed a patent LIMA to LAD.  The OM1 branch of the circumflex was occluded in its midportion.  The SVG to the circumflex was occluded.  SVG to the RCA was occluded.  The native RCA, which had undergone extensive rotation arthrectomy and stenting revealed patent stents.  Again, there was noted residual calcification and one stent still had a mild waist, which was this way despite being postdilated to 3.75 mm the time of the initial intervention.  Cassie. Huston Baker will again was rehospitalized in March.  She now has been on a medical regimen consisting of amiodarone 200 mg and is unaware of any recurrent atrial fibrillation or flutter.  She is continuing to take her to limit, platelet therapy with aspirin and Plavix.  Her blood pressure gets low, which limits additional medical treatment.  At times she does note some mild dizziness.  She presents for evaluation  Past Medical History  Diagnosis Date  . CHF (congestive heart failure)     diast SHF, RV failure, pulm HTN  . Diabetes mellitus   . ESRD on hemodialysis     HD MWF in [place]   . Hyperlipidemia   . Anemia   . Hypertension   . COPD (chronic obstructive pulmonary disease)     severe with FEV1 0.64 L  .  Peripheral vascular disease   . Coronary artery disease     Hx CABG. Cath 07/06/13 for CP episode showed occlusive graft disease and moderate AS; pt was not a candidate for repeat surgery and a complex PCI/rotablator/stenting procedure of calcified native RCA was done by cardiology  . PAF (paroxysmal atrial fibrillation)     recurrent  . Chronic right-sided heart failure   . Aortic stenosis     Past Surgical History  Procedure Laterality Date  . Coronary  artery bypass graft  10/20/2009    x3 by Dr Maren Beach with LIMA to the LAD, a vein to the circumflex and a vien to the PDA.  Marland Kitchen Abdominal hysterectomy    . Thoracentesis Left 12/17/2009  . Av fistula placement  01-12-2008    left upper arm  . Cardiac catheterization  10/15/2009    which showed low normal LV function with mild inferior hypocontractility.  . Thoracentesis Right 10/27/2013    Allergies  Allergen Reactions  . Codeine Rash  . Sulfa Antibiotics Other (See Comments)    unknown    Current Outpatient Prescriptions  Medication Sig Dispense Refill  . acetaminophen (TYLENOL) 325 MG tablet Take 650 mg by mouth every 6 (six) hours as needed for moderate pain.      Marland Kitchen amiodarone (PACERONE) 200 MG tablet Take 1 tablet (200 mg total) by mouth daily.  30 tablet  0  . aspirin 81 MG chewable tablet Chew 1 tablet (81 mg total) by mouth daily.      Marland Kitchen atorvastatin (LIPITOR) 40 MG tablet Take 1 tablet (40 mg total) by mouth daily at 6 PM.  90 tablet  3  . clopidogrel (PLAVIX) 75 MG tablet Take 1 tablet (75 mg total) by mouth daily with breakfast.  90 tablet  3  . fexofenadine (ALLEGRA) 180 MG tablet Take 180 mg by mouth daily.      . insulin aspart (NOVOLOG) 100 UNIT/ML injection Inject 2-10 Units into the skin See admin instructions. Sliding scale. 151-200 use 2 units; 201-250 use 4 units; 251-300 use 6 units; 301-350 use 8 units; 351-400 use 10 units      . multivitamin (RENA-VIT) TABS tablet Take 1 tablet by mouth at bedtime.  90 tablet  3  . nitroGLYCERIN (NITROSTAT) 0.4 MG SL tablet Place 1 tablet (0.4 mg total) under the tongue every 5 (five) minutes x 3 doses as needed for chest pain.  25 tablet  2   No current facility-administered medications for this visit.    History   Social History  . Marital Status: Widowed    Spouse Name: N/A    Number of Children: N/A  . Years of Education: N/A   Occupational History  . Retired    Social History Main Topics  . Smoking status: Former  Smoker -- 0.50 packs/day    Types: Cigarettes    Start date: 06/19/1973  . Smokeless tobacco: Never Used  . Alcohol Use: No  . Drug Use: No  . Sexual Activity: No   Other Topics Concern  . Not on file   Social History Narrative   The patient currently lives at home with a friend "Trinna Post", who helps her around the house.  The patient manages her own medications, using a pill box.    Family History  Problem Relation Age of Onset  . Heart disease Mother     ROS is negative for fevers, chills or night sweats.   She denies any new visual changes. She denies significant  change in weight. She denies hearing issues. He does note occasional palpitations. She denies PND orthopnea. She denies recurrent anginal symptoms. She denies wheezing, cough, or congestion. She denies abdominal pain. She denies blood in stool or urine. She denies recent edema. There is no history of stroke. She is status post hysterectomy. Has an AV fistula in her left upper extremity. Other comprehensive 14 point system review is negative.  PE BP 106/40  Pulse 73  Ht 5\' 4"  (1.626 m)  Wt 116 lb 12.8 oz (52.98 kg)  BMI 20.04 kg/m2  Repeat blood pressure by me was 102/54 supine it was 98/56 standing General: Alert, oriented, no distress.  Skin: normal turgor, no rashes HEENT: Normocephalic, atraumatic. Pupils round and reactive; sclera anicteric;no lid lag. Extraocular muscles intact. Nose without nasal septal hypertrophy Mouth/Parynx benign; Mallinpatti scale 3 Neck: No JVD, transmitted murmur to the carotid; slightly delayed carotid upstroke Lungs: clear to ausculatation and percussion; no wheezing or rales Chest wall: no tenderness to palpitation Heart: RRR, s1 s2 normal 2-3/6 systolic murmur in aortic area no AR; no S3 gallop, no rub, no thrills or heaves per Abdomen: soft, nontender; no hepatosplenomehaly, BS+; abdominal aorta nontender and not dilated by palpation. Back: no CVA tenderness Pulses 2+ soft femoral  bruits bilaterally Extremities: no clubbing cyanosis or edema, Homan's sign negative  Neurologic: grossly nonfocal; cranial nerves grossly normal. Psychologic: normal affect and mood.  ECG (independently read by me): Normal sinus rhythm with right branch block, repolarization changes.  Prior ECG of 09/11/2013 (independently read by me): Sinus rhythm at 81 beats per minute. 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  LABS:  BMET    Component Value Date/Time   NA 138 11/22/2013 1055   K 4.4 11/22/2013 1055   CL 96 11/22/2013 1055   CO2 31 11/22/2013 1055   GLUCOSE 159* 11/22/2013 1055   BUN 12 11/22/2013 1055   CREATININE 2.65* 11/22/2013 1055   CREATININE 4.32* 11/19/2013 0846   CALCIUM 9.0 11/22/2013 1055   GFRNONAA 10* 11/19/2013 0846   GFRAA 11* 11/19/2013 0846     Hepatic Function Panel     Component Value Date/Time   PROT 7.2 10/29/2013 0944   ALBUMIN 2.7* 11/19/2013 0846   AST 27 10/29/2013 0944   ALT 20 10/29/2013 0944   ALKPHOS 157* 10/29/2013 0944   BILITOT 0.4 10/29/2013 0944   BILIDIR <0.2 10/29/2013 0944   IBILI NOT CALCULATED 10/29/2013 0944     CBC    Component Value Date/Time   WBC 6.2 11/22/2013 1055   RBC 3.22* 11/22/2013 1055   HGB 9.5* 11/22/2013 1055   HCT 29.8* 11/22/2013 1055   PLT 344 11/22/2013 1055   MCV 92.5 11/22/2013 1055   MCH 29.5 11/22/2013 1055   MCHC 31.9 11/22/2013 1055   RDW 15.2 11/22/2013 1055   LYMPHSABS 0.7 11/12/2013 0520   MONOABS 1.8* 11/12/2013 0520   EOSABS 0.3 11/12/2013 0520   BASOSABS 0.0 11/12/2013 0520     BNP    Component Value Date/Time   PROBNP >70000.0* 10/22/2013 1439    Lipid Panel     Component Value Date/Time   CHOL 122 10/06/2013 0615   TRIG 36 10/06/2013 0615   HDL 89 10/06/2013 0615   CHOLHDL 1.4 10/06/2013 0615   VLDL 7 10/06/2013 0615   LDLCALC 26 10/06/2013 0615     RADIOLOGY: No results found.    ASSESSMENT AND PLAN: [name] is a 73 year old African American female who  suffered a non-ST segment elevation myocardial infarction in 2011 and underwent CABG surgery at that time. Subsequently, she's been found to have graft occlusion to both the vein graft supplying the RCA and a vein graft supplying the marginal vessel and has an intact LIMA graft. She also has developed severe aortic valve stenosis and was turned down for surgery. She underwent successful high-speed rotational arthrectomy of a severely diseased and calcified RCA and had extensive stenting with a 76 mm of DES stent placed. She has felt significantly improved following her successful intervention. She recently had developed palpitations and was placed back on diltiazem.  She developed recurrent atrial arrhythmia in February leading to mild recurrent non-ST segment MI leading to repeat catheterization.  Catheterization showed patent stents in the native RCA, which had extensively undergone rotational atherectomy.  She again was rehospitalized with recurrent arrhythmia in March.  Presently, she is maintaining sinus rhythm and is tolerating amiodarone 200 mg daily.  I had intended to shave very low dose.  Beta blocker therapy, but in light of her blood pressure limitation in her blood pressure getting low on dialysis.  I elected not to start this presently, since she is unaware of recurrent arrhythmia over the past month.  She does develop recurrent atrial arrhythmia, or amiodarone dose will be increased.  I will see her in 4 months for cardiology reevaluation.   Lennette Bihari, MD, [facility]  12/06/2013 8:19 AM

## 2013-12-06 NOTE — Patient Instructions (Signed)
Your physician recommends that you schedule a follow-up appointment in: 4 months. No changes were made today in your therapy. 

## 2013-12-10 ENCOUNTER — Inpatient Hospital Stay (HOSPITAL_COMMUNITY)
Admission: AD | Admit: 2013-12-10 | Discharge: 2013-12-12 | DRG: 308 | Disposition: A | Discharge status: Home / Self Care | Payer: Medicare Other | Source: Other Acute Inpatient Hospital | Attending: Internal Medicine | Admitting: Internal Medicine

## 2013-12-10 ENCOUNTER — Inpatient Hospital Stay (HOSPITAL_COMMUNITY): Payer: Medicare Other

## 2013-12-10 DIAGNOSIS — Z794 Long term (current) use of insulin: Secondary | ICD-10-CM

## 2013-12-10 DIAGNOSIS — E119 Type 2 diabetes mellitus without complications: Secondary | ICD-10-CM | POA: Diagnosis present

## 2013-12-10 DIAGNOSIS — E785 Hyperlipidemia, unspecified: Secondary | ICD-10-CM | POA: Diagnosis present

## 2013-12-10 DIAGNOSIS — I2510 Atherosclerotic heart disease of native coronary artery without angina pectoris: Secondary | ICD-10-CM | POA: Diagnosis present

## 2013-12-10 DIAGNOSIS — E1129 Type 2 diabetes mellitus with other diabetic kidney complication: Secondary | ICD-10-CM | POA: Diagnosis present

## 2013-12-10 DIAGNOSIS — J4489 Other specified chronic obstructive pulmonary disease: Secondary | ICD-10-CM | POA: Diagnosis present

## 2013-12-10 DIAGNOSIS — I4729 Other ventricular tachycardia: Secondary | ICD-10-CM | POA: Diagnosis present

## 2013-12-10 DIAGNOSIS — Z7902 Long term (current) use of antithrombotics/antiplatelets: Secondary | ICD-10-CM

## 2013-12-10 DIAGNOSIS — Z66 Do not resuscitate: Secondary | ICD-10-CM | POA: Diagnosis present

## 2013-12-10 DIAGNOSIS — Z951 Presence of aortocoronary bypass graft: Secondary | ICD-10-CM

## 2013-12-10 DIAGNOSIS — Z882 Allergy status to sulfonamides status: Secondary | ICD-10-CM

## 2013-12-10 DIAGNOSIS — I350 Nonrheumatic aortic (valve) stenosis: Secondary | ICD-10-CM | POA: Diagnosis present

## 2013-12-10 DIAGNOSIS — I472 Ventricular tachycardia, unspecified: Secondary | ICD-10-CM | POA: Diagnosis present

## 2013-12-10 DIAGNOSIS — I10 Essential (primary) hypertension: Secondary | ICD-10-CM | POA: Diagnosis present

## 2013-12-10 DIAGNOSIS — I739 Peripheral vascular disease, unspecified: Secondary | ICD-10-CM | POA: Diagnosis present

## 2013-12-10 DIAGNOSIS — I498 Other specified cardiac arrhythmias: Secondary | ICD-10-CM | POA: Diagnosis present

## 2013-12-10 DIAGNOSIS — Z7982 Long term (current) use of aspirin: Secondary | ICD-10-CM

## 2013-12-10 DIAGNOSIS — N186 End stage renal disease: Secondary | ICD-10-CM | POA: Diagnosis present

## 2013-12-10 DIAGNOSIS — I4891 Unspecified atrial fibrillation: Principal | ICD-10-CM | POA: Diagnosis present

## 2013-12-10 DIAGNOSIS — I120 Hypertensive chronic kidney disease with stage 5 chronic kidney disease or end stage renal disease: Secondary | ICD-10-CM | POA: Diagnosis present

## 2013-12-10 DIAGNOSIS — Z8249 Family history of ischemic heart disease and other diseases of the circulatory system: Secondary | ICD-10-CM

## 2013-12-10 DIAGNOSIS — I50812 Chronic right heart failure: Secondary | ICD-10-CM | POA: Diagnosis present

## 2013-12-10 DIAGNOSIS — I2789 Other specified pulmonary heart diseases: Secondary | ICD-10-CM | POA: Diagnosis present

## 2013-12-10 DIAGNOSIS — Z992 Dependence on renal dialysis: Secondary | ICD-10-CM

## 2013-12-10 DIAGNOSIS — J449 Chronic obstructive pulmonary disease, unspecified: Secondary | ICD-10-CM | POA: Diagnosis present

## 2013-12-10 DIAGNOSIS — Z87891 Personal history of nicotine dependence: Secondary | ICD-10-CM

## 2013-12-10 DIAGNOSIS — I959 Hypotension, unspecified: Secondary | ICD-10-CM | POA: Diagnosis present

## 2013-12-10 DIAGNOSIS — D631 Anemia in chronic kidney disease: Secondary | ICD-10-CM | POA: Diagnosis present

## 2013-12-10 DIAGNOSIS — I509 Heart failure, unspecified: Secondary | ICD-10-CM | POA: Diagnosis present

## 2013-12-10 DIAGNOSIS — Z9981 Dependence on supplemental oxygen: Secondary | ICD-10-CM

## 2013-12-10 DIAGNOSIS — N039 Chronic nephritic syndrome with unspecified morphologic changes: Secondary | ICD-10-CM

## 2013-12-10 DIAGNOSIS — N2581 Secondary hyperparathyroidism of renal origin: Secondary | ICD-10-CM | POA: Diagnosis present

## 2013-12-10 DIAGNOSIS — I480 Paroxysmal atrial fibrillation: Secondary | ICD-10-CM | POA: Diagnosis present

## 2013-12-10 DIAGNOSIS — I359 Nonrheumatic aortic valve disorder, unspecified: Secondary | ICD-10-CM | POA: Diagnosis present

## 2013-12-10 LAB — COMPREHENSIVE METABOLIC PANEL
ALBUMIN: 3.2 g/dL — AB (ref 3.5–5.2)
ALK PHOS: 152 U/L — AB (ref 39–117)
ALT: 18 U/L (ref 0–35)
AST: 28 U/L (ref 0–37)
BILIRUBIN TOTAL: 0.4 mg/dL (ref 0.3–1.2)
BUN: 23 mg/dL (ref 6–23)
CHLORIDE: 94 meq/L — AB (ref 96–112)
CO2: 28 mEq/L (ref 19–32)
Calcium: 9.3 mg/dL (ref 8.4–10.5)
Creatinine, Ser: 3.6 mg/dL — ABNORMAL HIGH (ref 0.50–1.10)
GFR calc Af Amer: 14 mL/min — ABNORMAL LOW (ref >90)
GFR calc non Af Amer: 12 mL/min — ABNORMAL LOW (ref >90)
Glucose, Bld: 160 mg/dL — ABNORMAL HIGH (ref 70–99)
Potassium: 3.4 mEq/L — ABNORMAL LOW (ref 3.7–5.3)
Sodium: 139 mEq/L (ref 137–147)
Total Protein: 6.8 g/dL (ref 6.0–8.3)

## 2013-12-10 LAB — PROTIME-INR
INR: 1.24 (ref 0.00–1.49)
Prothrombin Time: 15.3 seconds — ABNORMAL HIGH (ref 11.6–15.2)

## 2013-12-10 LAB — TROPONIN I: Troponin I: 0.32 ng/mL (ref <0.30)

## 2013-12-10 LAB — CBC
HEMATOCRIT: 31.5 % — AB (ref 36.0–46.0)
HEMOGLOBIN: 10 g/dL — AB (ref 12.0–15.0)
MCH: 29.7 pg (ref 26.0–34.0)
MCHC: 31.7 g/dL (ref 30.0–36.0)
MCV: 93.5 fL (ref 78.0–100.0)
Platelets: 254 10*3/uL (ref 150–400)
RBC: 3.37 MIL/uL — ABNORMAL LOW (ref 3.87–5.11)
RDW: 17.3 % — ABNORMAL HIGH (ref 11.5–15.5)
WBC: 7 10*3/uL (ref 4.0–10.5)

## 2013-12-10 LAB — MAGNESIUM: Magnesium: 2.1 mg/dL (ref 1.5–2.5)

## 2013-12-10 LAB — MRSA PCR SCREENING: MRSA BY PCR: POSITIVE — AB

## 2013-12-10 LAB — APTT: aPTT: 43 seconds — ABNORMAL HIGH (ref 24–37)

## 2013-12-10 LAB — PHOSPHORUS: Phosphorus: 3.7 mg/dL (ref 2.3–4.6)

## 2013-12-10 MED ORDER — MUPIROCIN 2 % EX OINT
1.0000 "application " | TOPICAL_OINTMENT | Freq: Two times a day (BID) | CUTANEOUS | Status: DC
  Administered 2013-12-11 – 2013-12-12 (×4): 1 via NASAL
  Filled 2013-12-10: qty 22

## 2013-12-10 MED ORDER — DILTIAZEM HCL 90 MG PO TABS
45.0000 mg | ORAL_TABLET | Freq: Four times a day (QID) | ORAL | Status: DC
  Administered 2013-12-10 – 2013-12-12 (×7): 45 mg via ORAL
  Filled 2013-12-10 (×10): qty 0.5

## 2013-12-10 MED ORDER — CLOPIDOGREL BISULFATE 75 MG PO TABS
75.0000 mg | ORAL_TABLET | Freq: Every day | ORAL | Status: DC
  Administered 2013-12-11 – 2013-12-12 (×2): 75 mg via ORAL
  Filled 2013-12-10 (×3): qty 1

## 2013-12-10 MED ORDER — RENA-VITE PO TABS
1.0000 | ORAL_TABLET | Freq: Every day | ORAL | Status: DC
Start: 2013-12-10 — End: 2013-12-12
  Administered 2013-12-10 – 2013-12-11 (×2): 1 via ORAL
  Filled 2013-12-10 (×3): qty 1

## 2013-12-10 MED ORDER — SODIUM CHLORIDE 0.9 % IJ SOLN
3.0000 mL | Freq: Two times a day (BID) | INTRAMUSCULAR | Status: DC
  Administered 2013-12-10 – 2013-12-12 (×4): 3 mL via INTRAVENOUS

## 2013-12-10 MED ORDER — ASPIRIN 81 MG PO CHEW
81.0000 mg | CHEWABLE_TABLET | Freq: Every day | ORAL | Status: DC
  Administered 2013-12-10 – 2013-12-12 (×3): 81 mg via ORAL
  Filled 2013-12-10 (×3): qty 1

## 2013-12-10 MED ORDER — AMIODARONE HCL 200 MG PO TABS
200.0000 mg | ORAL_TABLET | Freq: Every day | ORAL | Status: DC
  Administered 2013-12-10 – 2013-12-12 (×3): 200 mg via ORAL
  Filled 2013-12-10 (×3): qty 1

## 2013-12-10 MED ORDER — ACETAMINOPHEN 325 MG PO TABS
650.0000 mg | ORAL_TABLET | Freq: Four times a day (QID) | ORAL | Status: DC | PRN
  Administered 2013-12-11 – 2013-12-12 (×3): 650 mg via ORAL
  Filled 2013-12-10 (×3): qty 2

## 2013-12-10 MED ORDER — ATORVASTATIN CALCIUM 40 MG PO TABS
40.0000 mg | ORAL_TABLET | Freq: Every day | ORAL | Status: DC
  Administered 2013-12-11 – 2013-12-12 (×2): 40 mg via ORAL
  Filled 2013-12-10 (×2): qty 1

## 2013-12-10 MED ORDER — CHLORHEXIDINE GLUCONATE CLOTH 2 % EX PADS
6.0000 | MEDICATED_PAD | Freq: Every day | CUTANEOUS | Status: DC
  Administered 2013-12-11 – 2013-12-12 (×2): 6 via TOPICAL

## 2013-12-10 NOTE — Progress Notes (Signed)
Received a call from [facility] and [facility]. Mell[facility]Keith of [facility] about Cassie ReaPeggy Baker.  73 yo ESRD patient with ho COPD, DM, CAD/CABG, AS, CHF.  She completed 2 of 4 hours of her Dialysis today before she felt very weak.  She was found to have low BP and was [facility].    EDP was unsure of how low her BP was but thought it was in the 90s.  Initially she was tachy in the 120-130s.  She did NOT have chest pain.  1st troponin was 0.58, 2nd troponin was 0.52.    [facility].   Patient was given Cardizem.  Current VS are pulse 69, R 18, BP 127/58.  Oxygen sat is 100% on 2L.  Accepted to Team 10 for ACS rule out / Cardiac Eval.  Algis DownsMarianne York, PA-C [facility] Pager: [phone]

## 2013-12-10 NOTE — Progress Notes (Signed)
Pt arrived to unit via [facility]. Flow manager notified of arrival, pt placed on tele box 6E06, Felicia CMT notified, NSR. Will continue to monitor.

## 2013-12-11 DIAGNOSIS — I959 Hypotension, unspecified: Secondary | ICD-10-CM

## 2013-12-11 DIAGNOSIS — I4891 Unspecified atrial fibrillation: Principal | ICD-10-CM

## 2013-12-11 DIAGNOSIS — J449 Chronic obstructive pulmonary disease, unspecified: Secondary | ICD-10-CM

## 2013-12-11 DIAGNOSIS — E119 Type 2 diabetes mellitus without complications: Secondary | ICD-10-CM

## 2013-12-11 DIAGNOSIS — N186 End stage renal disease: Secondary | ICD-10-CM

## 2013-12-11 DIAGNOSIS — I25810 Atherosclerosis of coronary artery bypass graft(s) without angina pectoris: Secondary | ICD-10-CM

## 2013-12-11 DIAGNOSIS — Z992 Dependence on renal dialysis: Secondary | ICD-10-CM

## 2013-12-11 LAB — CBC
HCT: 31.5 % — ABNORMAL LOW (ref 36.0–46.0)
Hemoglobin: 9.8 g/dL — ABNORMAL LOW (ref 12.0–15.0)
MCH: 29 pg (ref 26.0–34.0)
MCHC: 31.1 g/dL (ref 30.0–36.0)
MCV: 93.2 fL (ref 78.0–100.0)
Platelets: 247 10*3/uL (ref 150–400)
RBC: 3.38 MIL/uL — ABNORMAL LOW (ref 3.87–5.11)
RDW: 17.4 % — ABNORMAL HIGH (ref 11.5–15.5)
WBC: 9.1 10*3/uL (ref 4.0–10.5)

## 2013-12-11 LAB — COMPREHENSIVE METABOLIC PANEL
ALBUMIN: 3.2 g/dL — AB (ref 3.5–5.2)
ALT: 20 U/L (ref 0–35)
AST: 32 U/L (ref 0–37)
Alkaline Phosphatase: 162 U/L — ABNORMAL HIGH (ref 39–117)
BILIRUBIN TOTAL: 0.4 mg/dL (ref 0.3–1.2)
BUN: 26 mg/dL — AB (ref 6–23)
CO2: 29 mEq/L (ref 19–32)
Calcium: 9.5 mg/dL (ref 8.4–10.5)
Chloride: 93 mEq/L — ABNORMAL LOW (ref 96–112)
Creatinine, Ser: 4.06 mg/dL — ABNORMAL HIGH (ref 0.50–1.10)
GFR calc Af Amer: 12 mL/min — ABNORMAL LOW (ref >90)
GFR calc non Af Amer: 10 mL/min — ABNORMAL LOW (ref >90)
GLUCOSE: 179 mg/dL — AB (ref 70–99)
POTASSIUM: 3.7 meq/L (ref 3.7–5.3)
Sodium: 139 mEq/L (ref 137–147)
Total Protein: 6.6 g/dL (ref 6.0–8.3)

## 2013-12-11 MED ORDER — DOXERCALCIFEROL 4 MCG/2ML IV SOLN
2.0000 ug | INTRAVENOUS | Status: DC
  Administered 2013-12-12: 2 ug via INTRAVENOUS
  Filled 2013-12-11: qty 2

## 2013-12-11 MED ORDER — DARBEPOETIN ALFA-POLYSORBATE 40 MCG/0.4ML IJ SOLN
40.0000 ug | INTRAMUSCULAR | Status: DC
  Administered 2013-12-12: 40 ug via INTRAVENOUS
  Filled 2013-12-11: qty 0.4

## 2013-12-11 NOTE — Progress Notes (Signed)
Asked by charge nurse to remove patients hemodialysis needles x 2. According to patient and charge nurse needles have been in since treatment yesterday. Needles removed x 2 without difficulty. Manual pressure held with 4x4. No active bleeding noted. Gauze applied and taped with paper tape. Patient's bedside nurse made aware. Thrill still present from fistula.

## 2013-12-11 NOTE — Progress Notes (Signed)
Subjective: Patient seen at bedside, she reports no complaints and says she is feeling "fine." She reports no further syncopal or near syncopal episodes. Review of telemetry shows 3-4 beats of NSVT. Objective: Vital signs in last 24 hours: Filed Vitals:   12/10/13 2329 12/10/13 2336 12/11/13 0555 12/11/13 0838  BP: 100/49 124/65 111/51 119/70  Pulse: 134 136 63 58  Temp:   98.6 F (37 C) 97.9 F (36.6 C)  TempSrc:   Oral Oral  SpO2:   100% 100%   Weight change:   Intake/Output Summary (Last 24 hours) at 12/11/13 1156 Last data filed at 12/10/13 2157  Gross per 24 hour  Intake      0 ml  Output      1 ml  Net     -1 ml   General: resting in bed HEENT: EOMI, no scleral icterus Cardiac: RRR, 3/6 systolic murmur over aortic area Pulm: decreased breath sounds over right base Abd: soft, nontender, nondistended, BS present Ext: 2+ DP pulses bilaterally, 1+ LE edema to knee b/l Neuro: alert and oriented X3, 5/5 gross motor strength in all four extremities  Lab Results: Basic Metabolic Panel:  Recent Labs Lab 12/10/13 2255 12/11/13 0624  NA 139 139  K 3.4* 3.7  CL 94* 93*  CO2 28 29  GLUCOSE 160* 179*  BUN 23 26*  CREATININE 3.60* 4.06*  CALCIUM 9.3 9.5  MG 2.1  --   PHOS 3.7  --    Liver Function Tests:  Recent Labs Lab 12/10/13 2255 12/11/13 0624  AST 28 32  ALT 18 20  ALKPHOS 152* 162*  BILITOT 0.4 0.4  PROT 6.8 6.6  ALBUMIN 3.2* 3.2*   CBC:  Recent Labs Lab 12/10/13 2255 12/11/13 0624  WBC 7.0 9.1  HGB 10.0* 9.8*  HCT 31.5* 31.5*  MCV 93.5 93.2  PLT 254 247   Cardiac Enzymes:  Recent Labs Lab 12/10/13 2255  TROPONINI 0.32*   Coagulation:  Recent Labs Lab 12/10/13 2255  LABPROT 15.3*  INR 1.24    Micro Results: Recent Results (from the past 240 hour(s))  MRSA PCR SCREENING     Status: Abnormal   Collection Time    12/10/13  7:39 PM      Result Value Ref Range Status   MRSA by PCR POSITIVE (*) NEGATIVE Final   Comment:             The GeneXpert MRSA Assay (FDA     approved for NASAL specimens     only), is one component of a     comprehensive MRSA colonization     surveillance program. It is not     intended to diagnose MRSA     infection nor to guide or     monitor treatment for     MRSA infections.     RESULT CALLED TO, READ BACK BY AND VERIFIED WITH:     Desmond LopeM BIRAGO RN [id number] 12/10/13 A BROWNING   Studies/Results: Portable Chest 1 View  12/11/2013   CLINICAL DATA:  Heart palpitations.  EXAM: PORTABLE CHEST - 1 VIEW  COMPARISON:  Earlier same day  FINDINGS: Artifact overlies the chest. Previous median sternotomy and CABG. Cardiomegaly. Pleural effusion on the right with right base atelectasis. Probable pulmonary venous hypertension without frank edema.  IMPRESSION: No change since earlier today. Right effusion with right base volume loss. Probable pulmonary venous hypertension.   Electronically Signed   By: Paulina FusiMark  Shogry M.D.   On: 12/11/2013 02:32  Medications: I have reviewed the patient's current medications. Scheduled Meds: . amiodarone  200 mg Oral Daily  . aspirin  81 mg Oral Daily  . atorvastatin  40 mg Oral q1800  . Chlorhexidine Gluconate Cloth  6 each Topical Q0600  . clopidogrel  75 mg Oral Q breakfast  . [START ON 12/12/2013] darbepoetin (ARANESP) injection - DIALYSIS  40 mcg Intravenous Q Wed-HD  . diltiazem  45 mg Oral 4 times per day  . [START ON 12/12/2013] doxercalciferol  2 mcg Intravenous Q M,W,F-HD  . multivitamin  1 tablet Oral QHS  . mupirocin ointment  1 application Nasal BID  . sodium chloride  3 mL Intravenous Q12H   Continuous Infusions:  PRN Meds:.acetaminophen Assessment/Plan: Hypotension/Syncope -Patient reportedly became hypotensive/syncopized during dialysis, he has a long history of intolerance of HD and hypotension during dialysis, including a PEA arrest.  She as severe AS and not a candidate for surgery or TAVR.  Despite these risk she has decided to continue HD. -BP  stable overnight on Amiodarone/Cardizem  Paroxsymal Afib -Currently appears to be in NSR. - Tolerating amiodarone 200mg  daily, cardizem 45mg  q6 - Cardiology following, appreciate recs if amiodarone can be increased versus continuing with cardizem.  Elevated Troponin - Mildly elevated in setting of ESRD and hypotension.  Cardiology involved, patient not a candidate for PCI. - Cardiology following  ESRD on HD- MWF - Will have HD tomorrow- Dr. Briant CedarMattingly following  Questionable Volume Overload -EDW 52kg, weight today 53kg, CXR shows right effusion with probable venous hypertension but patient does not exhibit signs of obvious volume overload.  COPD -Supplemental O2 to maintain >92% sat DM -Currently well controlled without SSI, if CBG >200 will start SSI-S.  Anemia in ESRD -Hgb this AM 9.8 -Aranesp tomorrow  Dispo: Awaiting cardiology eval, if patient tolerates Cardizem overnight can likely discharge tomorrow after dialysis.  The patient does have a current PCP (Imran Clarnce FlockP Haque, MD) and does not need [facility] after discharge.  The patient does not have transportation limitations that hinder transportation to clinic appointments.  .Services Needed at time of discharge: Y = Yes, Blank = No PT:   OT:   RN:   Equipment:   Other:     LOS: 1 day   Carlynn PurlErik Adlynn Lowenstein, DO 12/11/2013, 11:56 AM

## 2013-12-11 NOTE — Evaluation (Signed)
Physical Therapy Evaluation Patient Details Name: [name] MRN: [id number] DOB: September 03, 1940 Today's Date: 12/11/2013   History of Present Illness   Pt transferred from [facility] after she syncopized while receiving dialysis. She was found to have elevated troponins without EKG changes and she was therefore referred to [facility] for [facility] evaluation   Clinical Impression  Pt presents with generalized weakness and RLE weakness with some c/o pain today in RLE. She will benefit from continued PT     Follow Up Recommendations SNF    Equipment Recommendations  None recommended by PT    Recommendations for Other Services       Precautions / Restrictions Precautions Precautions: Fall Precaution Comments: h/o falls Restrictions Weight Bearing Restrictions: No      Mobility  Bed Mobility               General bed mobility comments: pt sitting up in a chair  Transfers Overall transfer level: Needs assistance Equipment used: Rolling walker (2 wheeled) Transfers: Sit to/from Stand Sit to Stand: Min assist         General transfer comment: pt needs extra time and cues to push up to stand  Ambulation/Gait Ambulation/Gait assistance: Min assist Ambulation Distance (Feet): 50 Feet Assistive device: Rolling walker (2 wheeled) Gait Pattern/deviations: Step-to pattern;Decreased stride length;Trunk flexed Gait velocity: decreased Gait velocity interpretation: <1.8 ft/sec, indicative of risk for recurrent falls General Gait Details: cues for safety, pt impulsive at times  Stairs            Wheelchair Mobility    Modified Rankin (Stroke Patients Only)       Balance Overall balance assessment: Modified Independent                                           Pertinent Vitals/Pain Rt calf. Did not rate    Home Living Family/patient expects to be discharged to:: [place]: [place] Rile -  2 wheels Additional Comments: pt reports she is getting PT at SNF    Prior Function Level of Independence: Needs assistance   Gait / Transfers Assistance Needed: Using RW.             Hand Dominance   Dominant Hand: Right    Extremity/Trunk Assessment   Upper Extremity Assessment: Generalized weakness           Lower Extremity Assessment: Generalized weakness;RLE deficits/detail;LLE deficits/detail RLE Deficits / Details: strenth is < 3/5 and pt c/o pain in calf. No warmness, redness or swelling noted.  LLE Deficits / Details: ~ 3/5 in strength  Cervical / Trunk Assessment: Kyphotic  Communication   Communication: No difficulties  Cognition Arousal/Alertness: Awake/alert Behavior During Therapy: WFL for tasks assessed/performed Overall Cognitive Status: Within Functional Limits for tasks assessed                      General Comments      Exercises        Assessment/Plan    PT Assessment Patient needs continued PT services;All further PT needs can be met in the next venue of care  PT Diagnosis Difficulty walking;Abnormality of gait;Generalized weakness   PT Problem List Decreased strength;Decreased activity tolerance;Decreased safety awareness  PT Treatment Interventions DME instruction;Gait training;Functional mobility training;Therapeutic exercise;Patient/family education  PT Goals (Current goals can be found in the Care Plan section) Acute Rehab PT Goals Patient Stated Goal: To return to SNF for rehab PT Goal Formulation: With patient Time For Goal Achievement: 12/25/13 Potential to Achieve Goals: Good    Frequency Min 2X/week   Barriers to discharge        Co-evaluation               End of Session   Activity Tolerance: Patient limited by fatigue Patient left: in chair;with call bell/phone within reach           Time: 1330-1356 PT Time Calculation (min): 26 min   Charges:   PT Evaluation $Initial PT Evaluation Tier I:  1 Procedure PT Treatments $Gait Training: 23-37 mins   PT G Codes:        Teresa K. [place], [place] 12/11/2013, 2:10 PM

## 2013-12-11 NOTE — Progress Notes (Signed)
CRITICAL VALUE ALERT  Critical value received:  Troponin level =0.32  Date of notification:  12/10/13  Time of notification:  23:54  Critical value read back:yes  Nurse who received alert:  Tempie DonningMercy Shanan Fitzpatrick   MD notified (1st page):  Ejiroghene Emokpae  Time of first page:  23:55  MD notified (2nd page):  Time of second page:  Responding MD:  Kennis CarinaEjiroghene Emokpae  Time MD responded:  23:59

## 2013-12-11 NOTE — Consult Note (Signed)
[facility] Renal Consultation Note  Indication for Consultation:  Management of ESRD/hemodialysis; anemia, hypertension/volume and secondary hyperparathyroidism  HPI: [name] is a 73 y.o. AA female, currently at [facility], with a history of diabetes, hypertension, COPD, paroxysmal atrial fibrillation, CAD (s/p CABG in 09/2009, HSRA/DES in 06/2013), aortic stenosis, and ESRD on dialysis on MWF at the [facility], where yesterday she had hypotension with elevated heart rate in the 130s during her dialysis, which was stopped after 2:22 of her 4:30 treatment to send her to [facility] and subsequently to [facility] for evaluation.  She reports that at the time she had nausea, but had had lightheadedness for two days.  She denies any fever, chills, dyspnea, vomiting, or diarrhea.  She was last hospitalized last month after PEA arrest and continues to be followed by her cardiologist, Dr. Claiborne Billings, with whom she had a follow-up 4/9.  Chest x-ray shows right pleural effusion with probable pulmonary venous hypertension without frank edema.    Dialysis Orders:  MWF @ [facility] 57 kg        4:30        2K/2.25Ca      400/A1.5        No Heparin     AVF @ [facility] Hectorol 2 mcg        Epogen 7800 U         No Venofer  Past Medical History  Diagnosis Date  . CHF (congestive heart failure)     diast SHF, RV failure, pulm HTN  . Diabetes mellitus   . ESRD on hemodialysis     HD MWF in [place]   . Hyperlipidemia   . Anemia   . Hypertension   . COPD (chronic obstructive pulmonary disease)     severe with FEV1 0.64 L  . Peripheral vascular disease   . Coronary artery disease     Hx CABG. Cath 07/06/13 for CP episode showed occlusive graft disease and moderate AS; pt was not a candidate for repeat surgery and a complex PCI/rotablator/stenting procedure of calcified native RCA was done by cardiology  . PAF (paroxysmal atrial fibrillation)     recurrent  . Chronic  right-sided heart failure   . Aortic stenosis    Past Surgical History  Procedure Laterality Date  . Coronary artery bypass graft  10/20/2009    x3 by Dr Darcey Nora with LIMA to the LAD, a vein to the circumflex and a vien to the PDA.  Marland Kitchen Abdominal hysterectomy    . Thoracentesis Left 12/17/2009  . Av fistula placement  01-12-2008    left upper arm  . Cardiac catheterization  10/15/2009    which showed low normal LV function with mild inferior hypocontractility.  . Thoracentesis Right 10/27/2013   Family History  Problem Relation Age of Onset  . Heart disease Mother    Social History She quit smoking cigarettes last year after using 1/2 pack for about 40 years.  She denies any alcohol or illicit drug use.  Allergies  Allergen Reactions  . Codeine Rash  . Sulfa Antibiotics Other (See Comments)    unknown   Prior to Admission medications   Medication Sig Start Date End Date Taking? Authorizing Provider  acetaminophen (TYLENOL) 325 MG tablet Take 650 mg by mouth every 6 (six) hours as needed for moderate pain.   Yes Historical Provider, MD  amiodarone (PACERONE) 200 MG tablet Take 1 tablet (200 mg total) by mouth daily. 11/12/13  Yes Marrion Coy, MD  aspirin 81 MG chewable tablet Chew 1 tablet (81 mg total) by mouth daily. 07/13/13  Yes Erlene Quan, PA-C  atorvastatin (LIPITOR) 40 MG tablet Take 1 tablet (40 mg total) by mouth daily at 6 PM. 07/13/13  Yes Erlene Quan, PA-C  clopidogrel (PLAVIX) 75 MG tablet Take 1 tablet (75 mg total) by mouth daily with breakfast. 07/13/13  Yes Doreene Burke Kilroy, PA-C  fexofenadine (ALLEGRA) 180 MG tablet Take 180 mg by mouth daily.   Yes Historical Provider, MD  insulin aspart (NOVOLOG) 100 UNIT/ML injection Inject 2-10 Units into the skin See admin instructions. Sliding scale. 151-200 use 2 units; 201-250 use 4 units; 251-300 use 6 units; 301-350 use 8 units; 351-400 use 10 units   Yes Historical Provider, MD  multivitamin (RENA-VIT) TABS  tablet Take 1 tablet by mouth at bedtime. 07/13/13  Yes Erlene Quan, PA-C  ACCU-CHEK AVIVA PLUS test strip  11/22/13   Historical Provider, MD  ASSURE COMFORT LANCETS 30G [place]  11/22/13   Historical Provider, MD  Blood Glucose Monitoring Suppl (ACCU-CHEK AVIVA PLUS) W/DEVICE KIT  11/22/13   Historical Provider, MD  nitroGLYCERIN (NITROSTAT) 0.4 MG SL tablet Place 1 tablet (0.4 mg total) under the tongue every 5 (five) minutes x 3 doses as needed for chest pain. 07/13/13   Erlene Quan, PA-C   Labs:  Results for orders placed during the hospital encounter of 12/10/13 (from the past 48 hour(s))  MRSA PCR SCREENING     Status: Abnormal   Collection Time    12/10/13  7:39 PM      Result Value Ref Range   MRSA by PCR POSITIVE (*) NEGATIVE   Comment:            The GeneXpert MRSA Assay (FDA     approved for NASAL specimens     only), is one component of a     comprehensive MRSA colonization     surveillance program. It is not     intended to diagnose MRSA     infection nor to guide or     monitor treatment for     MRSA infections.     RESULT CALLED TO, READ BACK BY AND VERIFIED WITH:     Pollie Meyer RN [id number] 12/10/13 A BROWNING  COMPREHENSIVE METABOLIC PANEL     Status: Abnormal   Collection Time    12/10/13 10:55 PM      Result Value Ref Range   Sodium 139  137 - 147 mEq/L   Potassium 3.4 (*) 3.7 - 5.3 mEq/L   Chloride 94 (*) 96 - 112 mEq/L   CO2 28  19 - 32 mEq/L   Glucose, Bld 160 (*) 70 - 99 mg/dL   BUN 23  6 - 23 mg/dL   Creatinine, Ser 3.60 (*) 0.50 - 1.10 mg/dL   Calcium 9.3  8.4 - 10.5 mg/dL   Total Protein 6.8  6.0 - 8.3 g/dL   Albumin 3.2 (*) 3.5 - 5.2 g/dL   AST 28  0 - 37 U/L   ALT 18  0 - 35 U/L   Alkaline Phosphatase 152 (*) 39 - 117 U/L   Total Bilirubin 0.4  0.3 - 1.2 mg/dL   GFR calc non Af Amer 12 (*) >90 mL/min   GFR calc Af Amer 14 (*) >90 mL/min   Comment: (NOTE)     The eGFR has been calculated using the CKD EPI equation.  This calculation has not been  validated in all clinical situations.     eGFR's persistently <90 mL/min signify possible Chronic Kidney     Disease.  MAGNESIUM     Status: None   Collection Time    12/10/13 10:55 PM      Result Value Ref Range   Magnesium 2.1  1.5 - 2.5 mg/dL  PHOSPHORUS     Status: None   Collection Time    12/10/13 10:55 PM      Result Value Ref Range   Phosphorus 3.7  2.3 - 4.6 mg/dL  CBC     Status: Abnormal   Collection Time    12/10/13 10:55 PM      Result Value Ref Range   WBC 7.0  4.0 - 10.5 K/uL   RBC 3.37 (*) 3.87 - 5.11 MIL/uL   Hemoglobin 10.0 (*) 12.0 - 15.0 g/dL   HCT 31.5 (*) 36.0 - 46.0 %   MCV 93.5  78.0 - 100.0 fL   MCH 29.7  26.0 - 34.0 pg   MCHC 31.7  30.0 - 36.0 g/dL   RDW 17.3 (*) 11.5 - 15.5 %   Platelets 254  150 - 400 K/uL  APTT     Status: Abnormal   Collection Time    12/10/13 10:55 PM      Result Value Ref Range   aPTT 43 (*) 24 - 37 seconds   Comment:            IF BASELINE aPTT IS ELEVATED,     SUGGEST PATIENT RISK ASSESSMENT     BE USED TO DETERMINE APPROPRIATE     ANTICOAGULANT THERAPY.  PROTIME-INR     Status: Abnormal   Collection Time    12/10/13 10:55 PM      Result Value Ref Range   Prothrombin Time 15.3 (*) 11.6 - 15.2 seconds   INR 1.24  0.00 - 1.49  TROPONIN I     Status: Abnormal   Collection Time    12/10/13 10:55 PM      Result Value Ref Range   Troponin I 0.32 (*) <0.30 ng/mL   Comment:            Due to the release kinetics of cTnI,     a negative result within the first hours     of the onset of symptoms does not rule out     myocardial infarction with certainty.     If myocardial infarction is still suspected,     repeat the test at appropriate intervals.     CRITICAL RESULT CALLED TO, READ BACK BY AND VERIFIED WITH:     BIRAGO,M RN 12/10/2013 2351 [place]  COMPREHENSIVE METABOLIC PANEL     Status: Abnormal   Collection Time    12/11/13  6:24 AM      Result Value Ref Range   Sodium 139  137 - 147 mEq/L   Potassium 3.7  3.7  - 5.3 mEq/L   Chloride 93 (*) 96 - 112 mEq/L   CO2 29  19 - 32 mEq/L   Glucose, Bld 179 (*) 70 - 99 mg/dL   BUN 26 (*) 6 - 23 mg/dL   Creatinine, Ser 4.06 (*) 0.50 - 1.10 mg/dL   Calcium 9.5  8.4 - 10.5 mg/dL   Total Protein 6.6  6.0 - 8.3 g/dL   Albumin 3.2 (*) 3.5 - 5.2 g/dL   AST 32  0 - 37 U/L   ALT 20  0 - 35 U/L   Alkaline Phosphatase 162 (*) 39 - 117 U/L   Total Bilirubin 0.4  0.3 - 1.2 mg/dL   GFR calc non Af Amer 10 (*) >90 mL/min   GFR calc Af Amer 12 (*) >90 mL/min   Comment: (NOTE)     The eGFR has been calculated using the CKD EPI equation.     This calculation has not been validated in all clinical situations.     eGFR's persistently <90 mL/min signify possible Chronic Kidney     Disease.  CBC     Status: Abnormal   Collection Time    12/11/13  6:24 AM      Result Value Ref Range   WBC 9.1  4.0 - 10.5 K/uL   RBC 3.38 (*) 3.87 - 5.11 MIL/uL   Hemoglobin 9.8 (*) 12.0 - 15.0 g/dL   HCT 31.5 (*) 36.0 - 46.0 %   MCV 93.2  78.0 - 100.0 fL   MCH 29.0  26.0 - 34.0 pg   MCHC 31.1  30.0 - 36.0 g/dL   RDW 17.4 (*) 11.5 - 15.5 %   Platelets 247  150 - 400 K/uL   Constitutional: negative for chills, fatigue, fevers and sweats Ears, nose, mouth, throat, and face: negative for earaches, hoarseness, nasal congestion and sore throat Respiratory: negative for cough, dyspnea on exertion, hemoptysis and sputum Cardiovascular: negative for chest pain, chest pressure/discomfort, dyspnea, orthopnea and palpitations Gastrointestinal: negative for abdominal pain, change in bowel habits, nausea and vomiting Genitourinary:negative, anuric Musculoskeletal:negative for arthralgias, back pain, myalgias and neck pain Neurological: negative for dizziness, headaches, paresthesia, speech problems and weakness  Physical Exam: Filed Vitals:   12/11/13 0838  BP: 119/70  Pulse: 58  Temp: 97.9 F (36.6 C)     General appearance: alert, cooperative and no distress Head: Normocephalic,  without obvious abnormality, atraumatic Neck: no adenopathy, no carotid bruit, no JVD and supple, symmetrical, trachea midline Resp: Basilar crackles Cardio: Irregular rhythm, Gr III/VI systolic murmur, no rub GI: soft, non-tender; bowel sounds normal; no masses,  no organomegaly Extremities: 1+ pretibial edema, atraumatic, no cyanosis Neurologic: Grossly normal Dialysis Access: AVF @ [facility] with + bruit   Assessment/Plan: 1. Hypotension/Tachycardia - SBP in 90s with HR > 130 during HD yesterday, now 119/70 & 58 on Cardizem q6h; Hx PAF, near syncope during HD, PEA arrest in March; seen by Cardiologist 4/9, currently asymptomatic. 2. ESRD - HD on MWF @ [facility], ran 2:22 of 4:30 yesterday, K 3.7.  Next HD tomorrow. 3. Volume - wt 53 kg today with EDW 52, CXR with no overt edema. 4. Anemia - Hgb 9.8, last T-sat 17% with ferritin 1600; on outpatient Epogen.  Aranesp 40 mcg tomorrow. 5. Metabolic bone disease - Ca 9.5 (10.1 corrected), P 3.7, last iPTH 433; Hectorol 2 mcg, no binders. 6. Nutrition - Alb 3.2, carb-mod renal diet, multivitamin. 7. COPD 8. DM - SSI at home.  Ramiro Harvest 12/11/2013, 9:40 AM  I have seen and examined this patient and agree with plan per Ramiro Harvest.  Her A fib and AS make her susceptible to hypotension.  Currently at her baseline and HR is controlled.  Will plan HD in AM.  Aranesep and hectorol ordered. Bennie Dallas 12/11/2013 10:48 AM Attending Nephrologist: Fleet Contras, MD

## 2013-12-11 NOTE — Evaluation (Signed)
Occupational Therapy Evaluation Patient Details Name: [name] A Foland MRN: [id number] DOB: November 26, 1940 Today's Date: 12/11/2013    History of Present Illness  Pt transferred from [facility] after she syncopized while receiving dialysis. She was found to have elevated troponins without EKG changes and she was therefore referred to [facility] for further evaluation    Clinical Impression   Pt is at min A - min guard A level with ADLs and ADL mobility. Pt planning to return to SNF to complete rehab. No further acute OT services indicated at this time, pt to continue with acute PT for functional mobility safety. Will defer further OT intervention to SNF    Follow Up Recommendations  SNF;Supervision/Assistance - 24 hour    Equipment Recommendations  None recommended by OT    Recommendations for Other Services       Precautions / Restrictions Precautions Precautions: Fall Precaution Comments: h/o falls Restrictions Weight Bearing Restrictions: No      Mobility Bed Mobility               General bed mobility comments: pt sitting up in a chair  Transfers Overall transfer level: Needs assistance Equipment used: Rolling walker (2 wheeled) Transfers: Sit to/from Stand Sit to Stand: Min assist         General transfer comment: pt needs extra time and cues to push up to stand, cues for hand placement    Balance Overall balance assessment: Modified Independent                                          ADL Overall ADL's : Needs assistance/impaired     Grooming: Wash/dry hands;Wash/dry face;Min guard;Standing   Upper Body Bathing: Min guard;Standing   Lower Body Bathing: Minimal assistance;Sit to/from stand   Upper Body Dressing : Min guard;Standing   Lower Body Dressing: Minimal assistance;Sit to/from stand   Toilet Transfer: Minimal assistance;RW;Grab Paramedicbars;Regular Toilet Toilet Transfer Details (indicate cue type and reason): pt needs extra  time and cues to push up to stand, cues for hand placement Toileting- Clothing Manipulation and Hygiene: Min guard;Sit to/from stand   Tub/ Engineer, structuralhower Transfer: Minimal assistance;Grab bars;3 in 1   Functional mobility during ADLs: Minimal assistance       Vision  wears glasses                              Pertinent Vitals/Pain No c/o pain, VSS     Hand Dominance Right   Extremity/Trunk Assessment Upper Extremity Assessment Upper Extremity Assessment: Generalized weakness   Lower Extremity Assessment Lower Extremity Assessment: Defer to PT evaluation RLE Deficits / Details: strenth is < 3/5 and pt c/o pain in calf. No warmness, redness or swelling noted.  LLE Deficits / Details: ~ 3/5 in strength   Cervical / Trunk Assessment Cervical / Trunk Assessment: Kyphotic   Communication Communication Communication: No difficulties   Cognition Arousal/Alertness: Awake/alert Behavior During Therapy: WFL for tasks assessed/performed Overall Cognitive Status: Within Functional Limits for tasks assessed                     General Comments   Pt pleasant and cooperative                 Home Living Family/patient expects to be discharged to:: Skilled nursing facility Living Arrangements:  Children;Non-relatives/Friends                           Home Equipment: Walker - 2 wheels   Additional Comments: pt reports she is getting PT/OT at SNF      Prior Functioning/Environment Level of Independence: Needs assistance  Gait / Transfers Assistance Needed: Using RW.       Comments: ambulates with cane or RW     OT Diagnosis: Generalized weakness   OT Problem List: Decreased strength;Decreased knowledge of use of DME or AE;Decreased activity tolerance;Impaired balance (sitting and/or standing)   OT Treatment/Interventions:      OT Goals(Current goals can be found in the care plan section) Acute Rehab OT Goals Patient Stated Goal: To return to  SNF for rehab OT Goal Formulation: With patient  OT Frequency:     Barriers to D/C:  none                        End of Session Equipment Utilized During Treatment: Rolling walker  Activity Tolerance: Patient tolerated treatment well Patient left: in chair;with call bell/phone within reach   Time: [id number] OT Time Calculation (min): 15 min Charges:  OT General Charges $OT Visit: 1 Procedure OT Evaluation $Initial OT Evaluation Tier I: 1 Procedure G-Codes:    Lafe GarinDenise J Lindsay Straka 12/11/2013, 3:38 PM

## 2013-12-11 NOTE — Progress Notes (Signed)
Notified MD about elevated heart rates (134, 136), no orders received. Will continue to monitor.

## 2013-12-11 NOTE — Clinical Social Work Psychosocial (Signed)
Clinical Social Work Department BRIEF PSYCHOSOCIAL ASSESSMENT 12/11/2013  Patient:  [name],[name]     Account Number:  [id number]     Admit date:  12/10/2013  Clinical Social Worker:  Delmer IslamRAWFORD,Jaisen Wiltrout, LCSW  Date/Time:  12/11/2013 03:05 AM  Referred by:  Physician  Date Referred:  12/10/2013 Referred for  SNF Placement   Other Referral:   Interview type:  Patient Other interview type:    PSYCHOSOCIAL DATA Living Status:  FACILITY Admitted from facility[facility]  [facility] Level of care:  Skilled Nursing Facility Primary support name:  Cassie SparkMaurice Baker Primary support relationship to patient:  CHILD, ADULT Degree of support available:   Patient stated her "son" is really not her son, but her cousin, however she raised him and he sees her as his mother and calls her "granny".  The other family member she raised is currently incarcerated.    CURRENT CONCERNS Current Concerns  Post-Acute Placement   Other Concerns:    SOCIAL WORK ASSESSMENT / PLAN CSW talked with patient regarding discharge plans and her intent to [facility] at discharge. Patient stated that she will return and complete her rehab then go home. Patient lives alone, however her son looks in on her and stays with her at times.   Assessment/plan status:  Psychosocial Support/Ongoing Assessment of Needs Other assessment/ plan:   Information/referral to community resources:   None needed or requested at this time    PATIENT'S/FAMILY'S RESPONSE TO PLAN OF CARE: [name] was alert, oriented and open to talking with CSW about her return to [facility]. CSW asked about transport back to facility and patient mentioned that Baker family member may be able to transport her. CSW will follow-up with patient regarding transport when ready for discharge.

## 2013-12-11 NOTE — H&P (Signed)
  Date: 12/11/2013  Patient name: [name]  Medical record number: [id number]  Date of birth: 10-15-1940   I have seen and evaluated [name] and discussed their care with the Residency Team. [name] is known to me from a previous admission. She has severe AS and is not a surgical candidate, ESRD on HD, A fib, and CAD. Also has h/o PEA arrest during HD earlier this year. Due to her complicated med hx and difficulty with HD, palliative care was consulted March 2015 and pt is now DNR but does want IV cardiac medication if needed to control arrythmia. Also wants HD "as long as it is an offered intervention she will continue "As long as she can". She is now at a SNF and had syncope during HD. She was [facility] to [facility] and found to have an elevated Trop and [facility]. She was in A Fib with RVR and started on PO Cardizem. She had palp during the event but is now palp free. She has no complaints this AM.   On exam, she has a reg rate and rhythm. Her lungs are clear. ABD + BS / S / NT/  Assessment and Plan: I have seen and evaluated the patient as outlined above. I agree with the formulated Assessment and Plan as detailed in the residents' admission note, with the following changes:   1. Syncope - This is not the first event of syncope during HD. Likely 2/2 severe AS, A fib, and inability to tolerate fluid shifts during HD. Will tx A Fib and follow.  2. A fib with RVR - pt responded to cardizem and is now a bit bradycardic. Cont to monitor on tele. If stable in Am, might be able to D/C.  3. ESRD - HD M/W/F. Renal following.   4. Elevated trop - she is not a surgical candidate or candidate for cath. Likely 2/2 A Fib with RVR. Cont medical tx.   Burns SpainElizabeth A Butcher, MD 4/14/20153:48 PM

## 2013-12-11 NOTE — H&P (Signed)
Date: 12/11/2013               Patient Name:  [name] MRN: [id number]  DOB: Apr 08, 1941 Age / Sex: 73 y.o., female   PCP: Raelyn Number, MD              Medical Service: Internal Medicine Teaching Service              Attending Physician: Dr. Bartholomew Crews, MD    First Contact: Dr. Eula Fried Pager: [phone]  Second Contact: Dr. Ronnald Ramp Pager: [phone]            After Hours (After 5p/  First Contact Pager: [phone]  weekends / holidays): Second Contact Pager: [phone]   Chief Complaint: Syncope and elevated troponins  History of Present Illness: 73 year old African American woman, who is a nursing home resident, with multiple medical problems, including atrial fibrillation, end-stage renal disease on hemodialysis, diabetes, severe coronary artery disease, status post CABG, and severe aortic stenosis is transferred from [facility] emergency department, after she syncopized while receiving dialysis. She was found to have elevated troponins without EKG changes and she was therefore referred to [facility] for further evaluation as NSTEMI. Reportedly she had received 2 out of 4 hour of her HD when symptoms started. She experiencing palpitations without chest pain or shortness of breath or diaphoresis. She reports nausea without vomiting. She did not loose consciousness. She reports ongoing lightheadedness for 2 days. No history of falls.  She denies weakness in her extremities, tingling, dysarthria, or alternation of sensation or vertigo. She's been compliant with her medications including amiodarone. She denies any constitutional symptoms, like fevers, chills, reduced appetite and increased fatigue. Patient was most recently discharged in 10/2013 after treatment for a very similar episode. She is not a candidate for beta blockers due to occurrence of bradycardia in the past.   Review of Systems: Constitutional: Denies fever, chills, diaphoresis, appetite change and fatigue.  HEENT:  Denies photophobia, trouble swallowing, neck pain, neck stiffness.  Cardiovascular: Denies chest pain, palpitations and leg swelling.  Gastrointestinal: Denies vomiting, abdominal pain, diarrhea, constipation, blood in stool and abdominal distention.  Genitourinary: Denies dysuria, urgency, frequency, hematuria, flank pain and difficulty urinating.  Musculoskeletal: Denies myalgias, back pain, joint swelling, arthralgias and gait problem.  Skin: Denies pallor, rash and wound.  Neurological: Denies seizures, weakness, numbness and headaches.    Meds: Medications Prior to Admission  Medication Sig Dispense Refill  . acetaminophen (TYLENOL) 325 MG tablet Take 650 mg by mouth every 6 (six) hours as needed for moderate pain.      Marland Kitchen amiodarone (PACERONE) 200 MG tablet Take 1 tablet (200 mg total) by mouth daily.  30 tablet  0  . aspirin 81 MG chewable tablet Chew 1 tablet (81 mg total) by mouth daily.      Marland Kitchen atorvastatin (LIPITOR) 40 MG tablet Take 1 tablet (40 mg total) by mouth daily at 6 PM.  90 tablet  3  . clopidogrel (PLAVIX) 75 MG tablet Take 1 tablet (75 mg total) by mouth daily with breakfast.  90 tablet  3  . fexofenadine (ALLEGRA) 180 MG tablet Take 180 mg by mouth daily.      . insulin aspart (NOVOLOG) 100 UNIT/ML injection Inject 2-10 Units into the skin See admin instructions. Sliding scale. 151-200 use 2 units; 201-250 use 4 units; 251-300 use 6 units; 301-350 use 8 units; 351-400 use 10 units      . multivitamin (RENA-VIT) TABS tablet  Take 1 tablet by mouth at bedtime.  90 tablet  3  . ACCU-CHEK AVIVA PLUS test strip       . ASSURE COMFORT LANCETS 30G MISC       . Blood Glucose Monitoring Suppl (ACCU-CHEK AVIVA PLUS) W/DEVICE KIT       . nitroGLYCERIN (NITROSTAT) 0.4 MG SL tablet Place 1 tablet (0.4 mg total) under the tongue every 5 (five) minutes x 3 doses as needed for chest pain.  25 tablet  2   Allergies: Allergies as of 12/10/2013 - Review Complete 12/10/2013  Allergen  Reaction Noted  . Codeine Rash 01/11/2012  . Sulfa antibiotics Other (See Comments) 01/11/2012   Past Medical History  Diagnosis Date  . CHF (congestive heart failure)     diast SHF, RV failure, pulm HTN  . Diabetes mellitus   . ESRD on hemodialysis     HD MWF in [place]   . Hyperlipidemia   . Anemia   . Hypertension   . COPD (chronic obstructive pulmonary disease)     severe with FEV1 0.64 L  . Peripheral vascular disease   . Coronary artery disease     Hx CABG. Cath 07/06/13 for CP episode showed occlusive graft disease and moderate AS; pt was not a candidate for repeat surgery and a complex PCI/rotablator/stenting procedure of calcified native RCA was done by cardiology  . PAF (paroxysmal atrial fibrillation)     recurrent  . Chronic right-sided heart failure   . Aortic stenosis    Past Surgical History  Procedure Laterality Date  . Coronary artery bypass graft  10/20/2009    x3 by Dr Darcey Nora with LIMA to the LAD, a vein to the circumflex and a vien to the PDA.  Marland Kitchen Abdominal hysterectomy    . Thoracentesis Left 12/17/2009  . Av fistula placement  01-12-2008    left upper arm  . Cardiac catheterization  10/15/2009    which showed low normal LV function with mild inferior hypocontractility.  . Thoracentesis Right 10/27/2013   Family History  Problem Relation Age of Onset  . Heart disease Mother    History   Social History  . Marital Status: Widowed    Spouse Name: N/A    Number of Children: N/A  . Years of Education: N/A   Occupational History  . Retired    Social History Main Topics  . Smoking status: Former Smoker -- 0.50 packs/day    Types: Cigarettes    Start date: 06/19/1973  . Smokeless tobacco: Never Used  . Alcohol Use: No  . Drug Use: No  . Sexual Activity: No   Other Topics Concern  . Not on file   Social History Narrative   The patient currently lives at home with a friend "Cristie Hem", who helps her around the house.  The patient manages her own  medications, using a pill box.    Physical Exam: Filed Vitals:   12/10/13 2336  BP: 124/65  Pulse: 136  Temp:    General: well developed, no acute distressed, cooperative with exam Head: atraumatic, normocephalic,  Eye: pupils equal, round and reactive; sclera anicteric; normal conjunctiva  Nose/throat: oropharynx clear, moist mucous membranes, pink gums  Neck: supple, no carotid bruits Lungs/Chest wall: crackles heard right basilar region,  normal work of breathing  Heart:  tachycardia with heart rate of 120 to 130s , irregular rhythm; , grade 3/6 systolic murmur in the aortic area.  Pulses: Reduced dorsalis pedis pulses, , with her feet slightly  cold to touch, but with good capillary return. Radial pulses are present symmetrically and 2+ and symmetric.  HD fistula in the left upper arm  Abdomen: Normal fullness, no rebound, guarding, or rigidity; normal bowel sounds; no masses or organomegaly  Skin: warm, dry, intact, normal turgor, no rashes  Extremities: no peripheral edema, clubbing, or cyanosis Neurologic: A&O X3, CN II - XII are grossly intact. Motor strength is 5/5 in the all 4 extremities.  Psych: patient is alert and oriented, mood and affect are normal and congruent, thought content is normal without delusions, thought process is linear, speech is normal and non-pressured, behavior is normal  Lab results: Basic Metabolic Panel:  Recent Labs  12/10/13 2255  NA 139  K 3.4*  CL 94*  CO2 28  GLUCOSE 160*  BUN 23  CREATININE 3.60*  CALCIUM 9.3  MG 2.1  PHOS 3.7   Liver Function Tests:  Recent Labs  12/10/13 2255  AST 28  ALT 18  ALKPHOS 152*  BILITOT 0.4  PROT 6.8  ALBUMIN 3.2*  CBC:  Recent Labs  12/10/13 2255  WBC 7.0  HGB 10.0*  HCT 31.5*  MCV 93.5  PLT 254   Cardiac Enzymes:  Recent Labs  12/10/13 2255  TROPONINI 0.32*   Coagulation:  Recent Labs  12/10/13 2255  LABPROT 15.3*  INR 1.24    Imaging results:  No results  found.  Other results: ED ECG REPORT   Date: 12/11/2013  EKG Time: 12:24 AM  Rate: 132  Rhythm: atrial fibrillation and supraventricular tachycardia (SVT),  unchanged from previous tracings, no acute ischemic changes  Axis: Right axis  Intervals:right bundle branch block and left anterior fascicular block  ST&T Change: None  Narrative Interpretation: Atrial fibrillation versus supraventricular tachycardia, with no signs of acute ischemic changes. Unchanged compared to prior. EKG of 11/06/2013.   Assessment & Plan by Problem: Principal Problem:   PAF (paroxysmal atrial fibrillation) Active Problems:   End stage renal disease on dialysis   DM (diabetes mellitus)   HTN (hypertension)   COPD (chronic obstructive pulmonary disease)   Aortic stenosis, severe   CAD- CABG X 3 09/2009, HSRA/DES to native RCA 07/11/13   Hypotension arterial   Chronic right-sided heart failure   Paroxysmal atrial fibrillation   73 year old African American woman, who is a nursing home resident, with multiple medical problems, including atrial fibrillation, end-stage renal disease on hemodialysis, diabetes, severe coronary artery disease, status post CABG, and severe aortic stenosis is transferred from [facility] emergency department, after she syncopized while receiving dialysis on the day of admission. She was found to have elevated troponins without EKG changes and she was therefore referred to [facility] for further evaluation as NSTEMI.   # Hypotension/Syncope: Reportedly, she became hypotensive while receiving dialysis. However, on arrival to the floor, blood pressure was 129/51 without orthostatics. Patient has had multiple admissions for near syncopal episodes, often happening during dialysis , including an episode of PEA arrest. During her admission in 10/2013 risks and benefits of HD were discussed in detail with the patient and she elected to continue with dialysis. She reports that currently she  is DO NOT RESUSCITATE after further contemplation.  Plan. - Monitor blood pressure closely. - No indication for IV fluids currently but if blood pressure drops, a small bolus of 250 cc of normal saline can be administered - Will avoid beta blockers for her atrial fibrillation due to low BP issues -  Her situation is further complicated by presence  of severe aortic stenosis, which can account for recurrent syncope episodes. Echo 10/06/13 showed EF 50-60% but aortic valve area 0.45. Again she is not a candidate for surgery or TAVR  - Updated patient's CODE STATUS to DO NOT RESUSCITATE , but will continue with full, pharmacological therapy - consider consulting palliative again as patient has a guard prognosis and GOC should be addressed.  # Paroxysmal AF: Symptomatic with palpitations with HR 120-130. Patient takes amiodarone 200 mg daily. She follows up with Dr. Claiborne Billings last see on 4/9 with f/u in 4 months. Plan. - Obtain EKG - Portable chest x-ray - Discussed with Dr. Aundra Dubin, of cardiology, who recommended continuing with amiodarone at 200 mg daily in addition to Cardizem 45 mg q6hr  - If symptoms persist will consider Cardizem drip - Monitor electrolytes and replace as needed. - per Dr Evette Georges office note of 4/9, if she develops recurrent atrial arrhythmia amiodarone dose will be increased   # Elevated Trop in the setting of severe CAD: Discussed with Dr. Aundra Dubin, who doubted acute cardiac ischemia. She has had prev elevation of trop which might be related to ESRD. Regardless, patient isn't a candidate for any intervention such as PCI. Most recent left cardiac catheterization on 07/07/2011 revealed severe disease involving multiple vessels both native and the grafts. Plan. -Per cardiology, no need to monitor troponin -Cardiology will follow  # COPD: No signs of acute exacerbation. Patient on home oxygen 2 L as needed. Will continue with this.  # ESRD on HD: Patient received two out of four hours  of her usual dialysis due to syncope. Informed nephrology. Patient has no acute dialysis needs at the moment.  # Diabetes: Hold home sliding-scale insulin. We'll switch her to carb modified diet with SSI. CBGs x 4 daily ac qhs  Dispo: Disposition is deferred at this time, awaiting improvement of current medical problems. Anticipated discharge in approximately 2-3 day(s).   The patient does have a current PCP (Imran Gertie Baron, MD), therefore is not require OPC follow-up after discharge.   The patient does not have transportation limitations that hinder transportation to clinic appointments.   Signed:  Bing Neighbors, MD PGY-1 Internal Medicine Teaching Service 12/11/2013, 2:41 AM

## 2013-12-12 DIAGNOSIS — D631 Anemia in chronic kidney disease: Secondary | ICD-10-CM

## 2013-12-12 DIAGNOSIS — N039 Chronic nephritic syndrome with unspecified morphologic changes: Secondary | ICD-10-CM

## 2013-12-12 LAB — RENAL FUNCTION PANEL
Albumin: 3 g/dL — ABNORMAL LOW (ref 3.5–5.2)
BUN: 34 mg/dL — ABNORMAL HIGH (ref 6–23)
CHLORIDE: 91 meq/L — AB (ref 96–112)
CO2: 26 meq/L (ref 19–32)
Calcium: 9.2 mg/dL (ref 8.4–10.5)
Creatinine, Ser: 4.9 mg/dL — ABNORMAL HIGH (ref 0.50–1.10)
GFR calc non Af Amer: 8 mL/min — ABNORMAL LOW (ref >90)
GFR, EST AFRICAN AMERICAN: 9 mL/min — AB (ref >90)
GLUCOSE: 257 mg/dL — AB (ref 70–99)
POTASSIUM: 4.1 meq/L (ref 3.7–5.3)
Phosphorus: 5.1 mg/dL — ABNORMAL HIGH (ref 2.3–4.6)
SODIUM: 135 meq/L — AB (ref 137–147)

## 2013-12-12 LAB — CBC
HCT: 31 % — ABNORMAL LOW (ref 36.0–46.0)
HEMOGLOBIN: 9.6 g/dL — AB (ref 12.0–15.0)
MCH: 28.4 pg (ref 26.0–34.0)
MCHC: 31 g/dL (ref 30.0–36.0)
MCV: 91.7 fL (ref 78.0–100.0)
Platelets: 252 10*3/uL (ref 150–400)
RBC: 3.38 MIL/uL — ABNORMAL LOW (ref 3.87–5.11)
RDW: 17.3 % — ABNORMAL HIGH (ref 11.5–15.5)
WBC: 8.8 10*3/uL (ref 4.0–10.5)

## 2013-12-12 MED ORDER — SODIUM CHLORIDE 0.9 % IV SOLN: 100.0000 mL | INTRAVENOUS | Status: DC | PRN

## 2013-12-12 MED ORDER — MUPIROCIN 2 % EX OINT: 1.0000 "application " | TOPICAL_OINTMENT | Freq: Two times a day (BID) | CUTANEOUS | Status: AC

## 2013-12-12 MED ORDER — CHLORHEXIDINE GLUCONATE CLOTH 2 % EX PADS: 6.0000 | MEDICATED_PAD | Freq: Every day | CUTANEOUS | Status: AC

## 2013-12-12 MED ORDER — PENTAFLUOROPROP-TETRAFLUOROETH EX AERO: 1.0000 "application " | INHALATION_SPRAY | CUTANEOUS | Status: DC | PRN

## 2013-12-12 MED ORDER — DILTIAZEM HCL ER COATED BEADS 180 MG PO CP24: 180.0000 mg | ORAL_CAPSULE | Freq: Every day | ORAL | Status: DC

## 2013-12-12 MED ORDER — NEPRO/CARBSTEADY PO LIQD
237.0000 mL | ORAL | Status: DC | PRN
  Filled 2013-12-12: qty 237

## 2013-12-12 MED ORDER — DOXERCALCIFEROL 4 MCG/2ML IV SOLN
INTRAVENOUS | Status: AC
  Filled 2013-12-12: qty 2

## 2013-12-12 MED ORDER — HEPARIN SODIUM (PORCINE) 1000 UNIT/ML DIALYSIS: 1000.0000 [IU] | INTRAMUSCULAR | Status: DC | PRN

## 2013-12-12 MED ORDER — LIDOCAINE HCL (PF) 1 % IJ SOLN: 5.0000 mL | INTRAMUSCULAR | Status: DC | PRN

## 2013-12-12 MED ORDER — ALTEPLASE 2 MG IJ SOLR
2.0000 mg | Freq: Once | INTRAMUSCULAR | Status: DC | PRN
  Filled 2013-12-12: qty 2

## 2013-12-12 MED ORDER — LIDOCAINE-PRILOCAINE 2.5-2.5 % EX CREA
1.0000 "application " | TOPICAL_CREAM | CUTANEOUS | Status: DC | PRN
  Filled 2013-12-12: qty 5

## 2013-12-12 MED ORDER — DARBEPOETIN ALFA-POLYSORBATE 40 MCG/0.4ML IJ SOLN
INTRAMUSCULAR | Status: AC
  Filled 2013-12-12: qty 0.4

## 2013-12-12 NOTE — Clinical Social Work Note (Signed)
Patient is medically stable for discharge back to [facility] today. CSW facilitating transport via ambulance.  Genelle BalVanessa Shareka Casale, MSW, LCSW [phone]

## 2013-12-12 NOTE — Progress Notes (Signed)
Report [facility] to [facility] at 1600. Patient will be transported after hemodialysis today. Son, Everlean AlstromMaurice is aware of discharge.

## 2013-12-12 NOTE — Procedures (Signed)
Pt seen on HD  Ap 130 Vp 180.  BFR 400 SBP 132.  Tolerating HD well

## 2013-12-12 NOTE — Discharge Summary (Signed)
Name: [name] MRN: [id number] DOB: September 14, 1940 73 y.o. PCP: Raelyn Number, MD  Date of Admission: 12/10/2013  7:19 PM Date of Discharge: 12/12/2013 Attending Physician: Bartholomew Crews, MD  Discharge Diagnosis: Principal Problem:   PAF (paroxysmal atrial fibrillation) Active Problems:   End stage renal disease on dialysis   DM (diabetes mellitus)   HTN (hypertension)   COPD (chronic obstructive pulmonary disease)   Aortic stenosis, severe   CAD- CABG X 3 09/2009, HSRA/DES to native RCA 07/11/13   Hypotension arterial   Chronic right-sided heart failure   Paroxysmal atrial fibrillation  Discharge Medications:   Medication List         ACCU-CHEK AVIVA PLUS test strip  Generic drug:  glucose blood     ACCU-CHEK AVIVA PLUS W/DEVICE Kit     acetaminophen 325 MG tablet  Commonly known as:  TYLENOL  Take 650 mg by mouth every 6 (six) hours as needed for moderate pain.     amiodarone 200 MG tablet  Commonly known as:  PACERONE  Take 200 mg by mouth daily.     aspirin 81 MG chewable tablet  Chew 81 mg by mouth daily.     ASSURE COMFORT LANCETS 30G Misc     atorvastatin 40 MG tablet  Commonly known as:  LIPITOR  Take 40 mg by mouth daily.     b complex-vitamin c-folic acid 0.8 MG Tabs tablet  Take 1 tablet by mouth at bedtime.     bisacodyl 5 MG EC tablet  Commonly known as:  DULCOLAX  Take 10 mg by mouth 2 (two) times daily as needed for mild constipation or moderate constipation.     Chlorhexidine Gluconate Cloth 2 % Pads  Apply 6 each topically daily at 6 (six) AM.     clopidogrel 75 MG tablet  Commonly known as:  PLAVIX  Take 75 mg by mouth daily with breakfast.     diltiazem 180 MG 24 hr capsule  Commonly known as:  CARDIZEM CD  Take 1 capsule (180 mg total) by mouth daily.     fexofenadine 180 MG tablet  Commonly known as:  ALLEGRA  Take 180 mg by mouth daily.     HYDROcodone-acetaminophen 5-325 MG per tablet  Commonly known as:   NORCO/VICODIN  Take 1 tablet by mouth every 8 (eight) hours as needed for moderate pain.     insulin aspart 100 UNIT/ML injection  Commonly known as:  novoLOG  - Inject 2-10 Units into the skin See admin instructions. Sliding scale.  - 151-200 use 2 units; 201-250 use 4 units; 251-300 use 6 units; 301-350 use 8 units; 351-400 use 10 units     mupirocin ointment 2 %  Commonly known as:  BACTROBAN  Place 1 application into the nose 2 (two) times daily.     nitroGLYCERIN 0.4 MG SL tablet  Commonly known as:  NITROSTAT  Place 0.4 mg under the tongue every 5 (five) minutes as needed for chest pain.        Disposition and follow-up:   [name] was discharged from [facility] in Stable condition.  At the hospital follow up visit please address:  1.  Blood Pressure, Heart Rate (Patient started on diltiazem XR 190m daily during hospitalization.  Symptoms of palpitations or lightheadness  2.  Labs / imaging needed at time of follow-up: None  3.  Pending labs/ test needing follow-up: None  Follow-up Appointments:   Discharge Instructions: Discharge  Orders   Future Orders Complete By Expires   (HEART FAILURE PATIENTS) Call MD:  Anytime you have any of the following symptoms: 1) 3 pound weight gain in 24 hours or 5 pounds in 1 week 2) shortness of breath, with or without a dry hacking cough 3) swelling in the hands, feet or stomach 4) if you have to sleep on extra pillows at night in order to breathe.  As directed    Call MD for:  extreme fatigue  As directed    Call MD for:  persistant dizziness or light-headedness  As directed    Diet - low sodium heart healthy  As directed    Discharge instructions  As directed    Increase activity slowly  As directed       Consultations: Treatment Team:  Windy Kalata, MD [facility], MD  Procedures Performed:  Dg Chest 2 View  11/17/2013   CLINICAL DATA:  Follow-up right pleural effusion and  associated consolidation in the right lower lobe.  EXAM: CHEST  2 VIEW  COMPARISON:  DG CHEST PORTABLE dated 11/16/2013; DG CHEST 1V PORT dated 10/29/2013; DG CHEST 1 VIEW dated 10/27/2013; DG CHEST PORTABLE dated 09/03/2013  FINDINGS: Prior sternotomy for CABG. Cardiac silhouette moderately enlarged but stable. Mild pulmonary venous hypertension without overt edema. Stable moderately large right pleural effusion and associated dense consolidation in the right middle lobe and right lower lobe. Streaky airspace consolidation in the left upper lobe, worse than on yesterday's examination. No left pleural effusion.  IMPRESSION: 1. Stable moderately large right pleural effusion and associated dense atelectasis and/or pneumonia in the right middle lobe and right lower lobe. 2. Worsening atelectasis/bronchovascular gas that worsening pneumonia involving the left upper lobe. 3. Stable moderate cardiomegaly. Pulmonary venous hypertension without overt edema.   Electronically Signed   By: Evangeline Dakin M.D.   On: 11/17/2013 01:05   Portable Chest 1 View  12/11/2013   CLINICAL DATA:  Heart palpitations.  EXAM: PORTABLE CHEST - 1 VIEW  COMPARISON:  Earlier same day  FINDINGS: Artifact overlies the chest. Previous median sternotomy and CABG. Cardiomegaly. Pleural effusion on the right with right base atelectasis. Probable pulmonary venous hypertension without frank edema.  IMPRESSION: No change since earlier today. Right effusion with right base volume loss. Probable pulmonary venous hypertension.   Electronically Signed   By: Nelson Chimes M.D.   On: 12/11/2013 02:32   Admission HPI: 73 year old African American woman, who is a nursing home resident, with multiple medical problems, including atrial fibrillation, end-stage renal disease on hemodialysis, diabetes, severe coronary artery disease, status post CABG, and severe aortic stenosis is transferred from [facility] emergency department, after she syncopized while  receiving dialysis. She was found to have elevated troponins without EKG changes and she was therefore referred to [facility] for further evaluation as NSTEMI. Reportedly she had received 2 out of 4 hour of her HD when symptoms started. She experiencing palpitations without chest pain or shortness of breath or diaphoresis. She reports nausea without vomiting. She did not loose consciousness. She reports ongoing lightheadedness for 2 days. No history of falls. She denies weakness in her extremities, tingling, dysarthria, or alternation of sensation or vertigo. She's been compliant with her medications including amiodarone. She denies any constitutional symptoms, like fevers, chills, reduced appetite and increased fatigue. Patient was most recently discharged in 10/2013 after treatment for a very similar episode. She is not a candidate for beta blockers due to occurrence of bradycardia in the  past.    Hospital Course by problem list: Hypotension/Syncope  -Patient presented due to syncope/ near syncope in setting of hypotension during HD, this presentation was similar to her previous episdoe 1 month ago at dialysis.  Per goals of care discussion at that time patient understands risks of dialysis and would like to continue.  She was found on admission to have a supraventricualr tachycardia and was started on cardizem 76m Q6 hours.  This controlled her heart rate and her BP remained stable.  She was able to tolerate HD, and was deemed stable for discharge back to SNF care.  Paroxsymal Afib  -Patient monitored on telemetry and maintained a sinus rhythm.  Cardizem [id number] was added to her medications on admission given a supraventricular tachycardia after consultation with Cardiology.  Although her primary cardiologist last office note did not the possibility of increasing her amiodarone.  Her heart rate and BP was stable on Cardizem + [id number] amiodarone and she remained asymptomatic.  Her Cardizem was converted  to extended release and she was discharged on this medicine.  She will need follow up with Dr. KelClaiborne Billingsardiology)  Elevated Troponin  - Patient had a mildly elevated in setting of ESRD and hypotension. Cardiology was involved and reported that this was to be expected and patient was not a candidate for PCI.    ESRD on HD- MWF  -Patient was seen by Nephrology and maintained her normal dialysis schedule.   DM  -Well controlled without SSI  Anemia in ESRD  -Hgb was 9-10 mg/dL Aranesp was given on 12/12/13.   Discharge Vitals:   BP 128/63  Pulse 61  Temp(Src) 97.8 F (36.6 C) (Oral)  Resp 20  Wt 123 lb 3.8 oz (55.9 kg)  SpO2 100%  Discharge Labs:  Results for orders placed during the hospital encounter of 12/10/13 (from the past 24 hour(s))  RENAL FUNCTION PANEL     Status: Abnormal   Collection Time    12/12/13  1:00 PM      Result Value Ref Range   Sodium 135 (*) 137 - 147 mEq/L   Potassium 4.1  3.7 - 5.3 mEq/L   Chloride 91 (*) 96 - 112 mEq/L   CO2 26  19 - 32 mEq/L   Glucose, Bld 257 (*) 70 - 99 mg/dL   BUN 34 (*) 6 - 23 mg/dL   Creatinine, Ser 4.90 (*) 0.50 - 1.10 mg/dL   Calcium 9.2  8.4 - 10.5 mg/dL   Phosphorus 5.1 (*) 2.3 - 4.6 mg/dL   Albumin 3.0 (*) 3.5 - 5.2 g/dL   GFR calc non Af Amer 8 (*) >90 mL/min   GFR calc Af Amer 9 (*) >90 mL/min  CBC     Status: Abnormal   Collection Time    12/12/13  1:00 PM      Result Value Ref Range   WBC 8.8  4.0 - 10.5 K/uL   RBC 3.38 (*) 3.87 - 5.11 MIL/uL   Hemoglobin 9.6 (*) 12.0 - 15.0 g/dL   HCT 31.0 (*) 36.0 - 46.0 %   MCV 91.7  78.0 - 100.0 fL   MCH 28.4  26.0 - 34.0 pg   MCHC 31.0  30.0 - 36.0 g/dL   RDW 17.3 (*) 11.5 - 15.5 %   Platelets 252  150 - 400 K/uL    Signed: EriJoni ReiningO 12/12/2013, 3:01 PM   Time Spent on Discharge: 25 minutes Services Ordered on Discharge: none Equipment Ordered on  Discharge: none

## 2013-12-12 NOTE — Progress Notes (Signed)
Subjective: Patient reports she is continuing to do well she denies any dizziness, lightheadedness or palpitations.   Review of telemetry shows multiple PVCs but otherwise unremarkable. Objective: Vital signs in last 24 hours: Filed Vitals:   12/11/13 2232 12/12/13 0457 12/12/13 0840 12/12/13 1300  BP: 130/70 151/45 147/50 130/65  Pulse: 58 55 59 60  Temp: 97.9 F (36.6 C) 97.8 F (36.6 C) 97.4 F (36.3 C)   TempSrc: Oral Oral Oral   Resp: 18 17 18    SpO2: 98% 99% 98%    Weight change:  No intake or output data in the 24 hours ending 12/12/13 1320  General: resting in bed HEENT: EOMI, no scleral icterus Cardiac: RRR, 3/6 systolic murmur over aortic area, no thrill Pulm: mild decreased breath sounds over right base but otherwise CTA Abd: soft, nontender, nondistended, BS present Ext:1+ LE edema to knee b/l  Lab Results: Basic Metabolic Panel:  Recent Labs Lab 12/10/13 2255 12/11/13 0624  NA 139 139  K 3.4* 3.7  CL 94* 93*  CO2 28 29  GLUCOSE 160* 179*  BUN 23 26*  CREATININE 3.60* 4.06*  CALCIUM 9.3 9.5  MG 2.1  --   PHOS 3.7  --    Liver Function Tests:  Recent Labs Lab 12/10/13 2255 12/11/13 0624  AST 28 32  ALT 18 20  ALKPHOS 152* 162*  BILITOT 0.4 0.4  PROT 6.8 6.6  ALBUMIN 3.2* 3.2*   CBC:  Recent Labs Lab 12/11/13 0624 12/12/13 1300  WBC 9.1 8.8  HGB 9.8* 9.6*  HCT 31.5* 31.0*  MCV 93.2 91.7  PLT 247 252   Cardiac Enzymes:  Recent Labs Lab 12/10/13 2255  TROPONINI 0.32*   Coagulation:  Recent Labs Lab 12/10/13 2255  LABPROT 15.3*  INR 1.24    Micro Results: Recent Results (from the past 240 hour(s))  MRSA PCR SCREENING     Status: Abnormal   Collection Time    12/10/13  7:39 PM      Result Value Ref Range Status   MRSA by PCR POSITIVE (*) NEGATIVE Final   Comment:            The GeneXpert MRSA Assay (FDA     approved for NASAL specimens     only), is one component of a     comprehensive MRSA colonization   surveillance program. It is not     intended to diagnose MRSA     infection nor to guide or     monitor treatment for     MRSA infections.     RESULT CALLED TO, READ BACK BY AND VERIFIED WITH:     Desmond LopeM BIRAGO RN [id number] 12/10/13 A BROWNING   Studies/Results: Portable Chest 1 View  12/11/2013   CLINICAL DATA:  Heart palpitations.  EXAM: PORTABLE CHEST - 1 VIEW  COMPARISON:  Earlier same day  FINDINGS: Artifact overlies the chest. Previous median sternotomy and CABG. Cardiomegaly. Pleural effusion on the right with right base atelectasis. Probable pulmonary venous hypertension without frank edema.  IMPRESSION: No change since earlier today. Right effusion with right base volume loss. Probable pulmonary venous hypertension.   Electronically Signed   By: Paulina FusiMark  Shogry M.D.   On: 12/11/2013 02:32   Medications: I have reviewed the patient's current medications. Scheduled Meds: . amiodarone  200 mg Oral Daily  . aspirin  81 mg Oral Daily  . atorvastatin  40 mg Oral q1800  . Chlorhexidine Gluconate Cloth  6 each Topical Q0600  .  clopidogrel  75 mg Oral Q breakfast  . darbepoetin (ARANESP) injection - DIALYSIS  40 mcg Intravenous Q Wed-HD  . diltiazem  45 mg Oral 4 times per day  . doxercalciferol  2 mcg Intravenous Q M,W,F-HD  . multivitamin  1 tablet Oral QHS  . mupirocin ointment  1 application Nasal BID  . sodium chloride  3 mL Intravenous Q12H   Continuous Infusions:  PRN Meds:.sodium chloride, sodium chloride, acetaminophen, alteplase, feeding supplement (NEPRO CARB STEADY), heparin, lidocaine (PF), lidocaine-prilocaine, pentafluoroprop-tetrafluoroeth Assessment/Plan: Hypotension/Syncope -Lowest BP reading overnight 110/64, patient remains asymptomatic.  She has had a chronic issue of poor tolerance of HD but wishes to continue. -To go for HD today.  -Can be discharged to SNF, patient will need to follow up with Cardiology as outpatient.  Paroxsymal Afib -Currently appears to be in NSR. -  Tolerating amiodarone 200mg  daily, cardizem 45mg  q6 - Will concert Cardizem to extended release and discharge on this medication. Patient will need to follow up with cardiology.  Elevated Troponin - Mildly elevated in setting of ESRD and hypotension.  Cardiology involved, patient not a candidate for PCI.  ESRD on HD- MWF - HD today  COPD -Supplemental O2 to maintain >92% sat  DM -Currently well controlled without SSI, if CBG >200 will start SSI-S.  Anemia in ESRD -Aranesp today  Dispo: Can discharge after HD today.  The patient does have a current PCP (Imran Clarnce FlockP Haque, MD) and [facility] after discharge.  The patient does not have transportation limitations that hinder transportation to clinic appointments.  .Services Needed at time of discharge: Y = Yes, Blank = No PT:   OT:   RN:   Equipment:   Other:     LOS: 2 days   Carlynn PurlErik Keagan Brislin, DO 12/12/2013, 1:20 PM

## 2013-12-12 NOTE — Progress Notes (Signed)
  Date: 12/12/2013  Patient name: [name]  Medical record number: [id number]  Date of birth: Oct 07, 1940   This patient has been seen and the plan of care was discussed with the house staff. Please see their note for complete details. I concur with their findings with the following additions/corrections: [name] is feeling well without palp or dizziness. Still with systolic murmur. Now reg rate and rhythm. HR in 50's and 60's and decent BP. For HD today then D/C home.   Burns SpainElizabeth A Shanetra Blumenstock, MD 12/12/2013, 2:24 PM

## 2013-12-12 NOTE — Progress Notes (Signed)
[facility] Patient was discharged with belongings. Report was called prior to patients discharge. Patients son, [name] was notified of patients discharge. Patient was stable upon discharge.

## 2013-12-12 NOTE — Progress Notes (Signed)
Subjective:  No complaints, no dyspnea since arrival  Objective: Vital signs in last 24 hours: Temp:  [97.8 F (36.6 C)-98.3 F (36.8 C)] 97.8 F (36.6 C) (04/15 0457) Pulse Rate:  [55-61] 55 (04/15 0457) Resp:  [17-18] 17 (04/15 0457) BP: (110-151)/(45-70) 151/45 mmHg (04/15 0457) SpO2:  [98 %-100 %] 99 % (04/15 0457) Weight change:   Intake/Output from previous day: 04/14 0701 - 04/15 0700 In: 240 [P.O.:240] Out: -    Lab Results:  Recent Labs  12/10/13 2255 12/11/13 0624  WBC 7.0 9.1  HGB 10.0* 9.8*  HCT 31.5* 31.5*  PLT 254 247   BMET:  Recent Labs  12/10/13 2255 12/11/13 0624  NA 139 139  K 3.4* 3.7  CL 94* 93*  CO2 28 29  GLUCOSE 160* 179*  BUN 23 26*  CREATININE 3.60* 4.06*  CALCIUM 9.3 9.5  ALBUMIN 3.2* 3.2*   No results found for this basename: PTH,  in the last 72 hours Iron Studies: No results found for this basename: IRON, TIBC, TRANSFERRIN, FERRITIN,  in the last 72 hours  Studies/Results: No results found.  EXAM: General appearance:  Alert, in no apparent distress Resp:  CTA without rales, rhonchi, or wheezes Cardio:  Irregular rhythm with Gr III/VI systolic murmur, no rub GI: + BS, soft and nontender Extremities:  1+ pretibial edema Access:  AVF @ RUA with + bruit  Dialysis Orders: MWF @ [facility]  57 kg 4:30 2K/2.25Ca 400/A1.5 No Heparin AVF @ LUA  Hectorol 2 mcg Epogen 7800 U No Venofer  Assessment/Plan: 1. Hypotension/Tachycardia - SBP in 90s with HR > 130 during HD yesterday, now 151/45 & 55 on Cardizem q6h; Hx PAF, near syncope during HD, PEA arrest in March; seen by Cardiologist 4/9, currently asymptomatic. 2. ESRD - HD on MWF @ [facility], ran 2:22 of 4:30 on 4/13, K 3.7.  HD pending today. 3. Volume - wt 53 kg today with EDW 52, CXR with no overt edema, asymptomatic. 4. Anemia - Hgb 9.8, last T-sat 17% with ferritin 1600; on outpatient Epogen. Aranesp 40 mcg today. 5. Metabolic bone disease - Ca 9.5 (10.1 corrected), P 3.7, last iPTH 433;  Hectorol 2 mcg, no binders. 6. Nutrition - Alb 3.2, carb-mod renal diet, multivitamin. 7. COPD 8. DM - SSI at home.     LOS: 2 days   [name] 12/12/2013,8:41 AM I have seen and examined this patient and agree with plan per [name].  Feels well.  BP and HR good at present.  She feels well enough to go back to NH.  Plan HD today. Jorje GuildMichael T Amairani Shuey,MD 12/12/2013 9:43 AM

## 2013-12-19 NOTE — Telephone Encounter (Signed)
Encounter has been closed--TP 12/19/13 

## 2013-12-31 NOTE — Telephone Encounter (Signed)
Encounter Closed---12/31/13 TP 

## 2014-01-11 ENCOUNTER — Emergency Department (HOSPITAL_COMMUNITY)
Admission: EM | Admit: 2014-01-11 | Discharge: 2014-01-11 | Disposition: A | Discharge status: Home / Self Care | Payer: Medicare Other | Attending: Emergency Medicine | Admitting: Emergency Medicine

## 2014-01-11 ENCOUNTER — Encounter (HOSPITAL_COMMUNITY): Payer: Self-pay | Admitting: Emergency Medicine

## 2014-01-11 ENCOUNTER — Other Ambulatory Visit: Payer: Self-pay

## 2014-01-11 ENCOUNTER — Emergency Department (HOSPITAL_COMMUNITY): Payer: Medicare Other

## 2014-01-11 DIAGNOSIS — D649 Anemia, unspecified: Secondary | ICD-10-CM | POA: Insufficient documentation

## 2014-01-11 DIAGNOSIS — R55 Syncope and collapse: Secondary | ICD-10-CM | POA: Insufficient documentation

## 2014-01-11 DIAGNOSIS — N186 End stage renal disease: Secondary | ICD-10-CM | POA: Insufficient documentation

## 2014-01-11 DIAGNOSIS — Z87891 Personal history of nicotine dependence: Secondary | ICD-10-CM | POA: Insufficient documentation

## 2014-01-11 DIAGNOSIS — Z794 Long term (current) use of insulin: Secondary | ICD-10-CM | POA: Insufficient documentation

## 2014-01-11 DIAGNOSIS — I2510 Atherosclerotic heart disease of native coronary artery without angina pectoris: Secondary | ICD-10-CM | POA: Insufficient documentation

## 2014-01-11 DIAGNOSIS — Z992 Dependence on renal dialysis: Secondary | ICD-10-CM | POA: Insufficient documentation

## 2014-01-11 DIAGNOSIS — Z7902 Long term (current) use of antithrombotics/antiplatelets: Secondary | ICD-10-CM | POA: Insufficient documentation

## 2014-01-11 DIAGNOSIS — I509 Heart failure, unspecified: Secondary | ICD-10-CM | POA: Insufficient documentation

## 2014-01-11 DIAGNOSIS — I4891 Unspecified atrial fibrillation: Secondary | ICD-10-CM | POA: Insufficient documentation

## 2014-01-11 DIAGNOSIS — Z951 Presence of aortocoronary bypass graft: Secondary | ICD-10-CM | POA: Insufficient documentation

## 2014-01-11 DIAGNOSIS — J4489 Other specified chronic obstructive pulmonary disease: Secondary | ICD-10-CM | POA: Insufficient documentation

## 2014-01-11 DIAGNOSIS — I120 Hypertensive chronic kidney disease with stage 5 chronic kidney disease or end stage renal disease: Secondary | ICD-10-CM | POA: Insufficient documentation

## 2014-01-11 DIAGNOSIS — E119 Type 2 diabetes mellitus without complications: Secondary | ICD-10-CM | POA: Insufficient documentation

## 2014-01-11 DIAGNOSIS — I739 Peripheral vascular disease, unspecified: Secondary | ICD-10-CM | POA: Insufficient documentation

## 2014-01-11 DIAGNOSIS — Z79899 Other long term (current) drug therapy: Secondary | ICD-10-CM | POA: Insufficient documentation

## 2014-01-11 DIAGNOSIS — J449 Chronic obstructive pulmonary disease, unspecified: Secondary | ICD-10-CM | POA: Insufficient documentation

## 2014-01-11 DIAGNOSIS — Z7982 Long term (current) use of aspirin: Secondary | ICD-10-CM | POA: Insufficient documentation

## 2014-01-11 LAB — CBC WITH DIFFERENTIAL/PLATELET
BASOS ABS: 0 10*3/uL (ref 0.0–0.1)
BASOS PCT: 0 % (ref 0–1)
Eosinophils Absolute: 0.3 10*3/uL (ref 0.0–0.7)
Eosinophils Relative: 3 % (ref 0–5)
HCT: 28.4 % — ABNORMAL LOW (ref 36.0–46.0)
HEMOGLOBIN: 8.6 g/dL — AB (ref 12.0–15.0)
Lymphocytes Relative: 7 % — ABNORMAL LOW (ref 12–46)
Lymphs Abs: 0.6 10*3/uL — ABNORMAL LOW (ref 0.7–4.0)
MCH: 28.6 pg (ref 26.0–34.0)
MCHC: 30.3 g/dL (ref 30.0–36.0)
MCV: 94.4 fL (ref 78.0–100.0)
MONOS PCT: 13 % — AB (ref 3–12)
Monocytes Absolute: 1.2 10*3/uL — ABNORMAL HIGH (ref 0.1–1.0)
NEUTROS ABS: 6.9 10*3/uL (ref 1.7–7.7)
NEUTROS PCT: 77 % (ref 43–77)
Platelets: 267 10*3/uL (ref 150–400)
RBC: 3.01 MIL/uL — ABNORMAL LOW (ref 3.87–5.11)
RDW: 19.1 % — ABNORMAL HIGH (ref 11.5–15.5)
WBC: 9 10*3/uL (ref 4.0–10.5)

## 2014-01-11 LAB — I-STAT CHEM 8, ED
BUN: 20 mg/dL (ref 6–23)
CALCIUM ION: 1.11 mmol/L — AB (ref 1.13–1.30)
CHLORIDE: 95 meq/L — AB (ref 96–112)
Creatinine, Ser: 3.2 mg/dL — ABNORMAL HIGH (ref 0.50–1.10)
Glucose, Bld: 184 mg/dL — ABNORMAL HIGH (ref 70–99)
HEMATOCRIT: 32 % — AB (ref 36.0–46.0)
Hemoglobin: 10.9 g/dL — ABNORMAL LOW (ref 12.0–15.0)
POTASSIUM: 3.2 meq/L — AB (ref 3.7–5.3)
Sodium: 139 mEq/L (ref 137–147)
TCO2: 33 mmol/L (ref 0–100)

## 2014-01-11 LAB — POC OCCULT BLOOD, ED: Fecal Occult Bld: NEGATIVE

## 2014-01-11 MED ORDER — POTASSIUM CHLORIDE CRYS ER 20 MEQ PO TBCR
20.0000 meq | EXTENDED_RELEASE_TABLET | Freq: Once | ORAL | Status: AC
  Administered 2014-01-11: 20 meq via ORAL
  Filled 2014-01-11: qty 1

## 2014-01-11 NOTE — ED Notes (Signed)
Son arrived to pick pt up. Pt helped into wheelchair and taken to d/c

## 2014-01-11 NOTE — ED Notes (Signed)
Per EMS - pt coming from dialysis center. While she was there staff reports that pt went unconscious for a few moments, BP dropped into 50 systolic they administered 100 ml of NS. Upon ems arrival - pt was alert and oriented x 4, BP 88/54 then 100/56. HR 64. 12 lead showing RBBB. Pt only got through about 20 mins of dialysis. Pt reports on Wednesday she went to dialysis and was able to get through her entire treatment then. CBG 159. Pt has fistula in left upper arm that is still accessed. Nad, skin warm and dry, resp e/u.

## 2014-01-11 NOTE — ED Notes (Signed)
Pt was recently at a rehab facility and discharged from there prior to going to dialysis.

## 2014-01-11 NOTE — ED Provider Notes (Signed)
CSN: [id number]     Arrival date & time 01/11/14  0818 History   First MD Initiated Contact with Patient 01/11/14 [phone]     Chief Complaint  Patient presents with  . Altered Mental Status   History is obtained from EMS and from Ashley Mariner, RN, charge nurse at dialysis center  (Consider location/radiation/quality/duration/timing/severity/associated sxs/prior Treatment) HPI   patient became "glassy eyed" and less responsive this morning 7:12 AM during hemodialysis her blood pressure dropped to 93/57.[facility] was called. No treatment prior to coming here. Patient presently is asymptomatic presently. No other associated symptoms. PatientPast Medical History  Diagnosis Date  . CHF (congestive heart failure)     diast SHF, RV failure, pulm HTN  . Diabetes mellitus   . ESRD on hemodialysis     HD MWF in [place]   . Hyperlipidemia   . Anemia   . Hypertension   . COPD (chronic obstructive pulmonary disease)     severe with FEV1 0.64 L  . Peripheral vascular disease   . Coronary artery disease     Hx CABG. Cath 07/06/13 for CP episode showed occlusive graft disease and moderate AS; pt was not a candidate for repeat surgery and a complex PCI/rotablator/stenting procedure of calcified native RCA was done by cardiology  . PAF (paroxysmal atrial fibrillation)     recurrent  . Chronic right-sided heart failure   . Aortic stenosis    Past Surgical History  Procedure Laterality Date  . Coronary artery bypass graft  10/20/2009    x3 by Dr Darcey Nora with LIMA to the LAD, a vein to the circumflex and a vien to the PDA.  [facility] Abdominal hysterectomy    . Thoracentesis Left 12/17/2009  . Av fistula placement  01-12-2008    left upper arm  . Cardiac catheterization  10/15/2009    which showed low normal LV function with mild inferior hypocontractility.  . Thoracentesis Right 10/27/2013   Family History  Problem Relation Age of Onset  . Heart disease Mother    History  Substance Use Topics  . Smoking  status: Former Smoker -- 0.50 packs/day    Types: Cigarettes    Start date: 06/19/1973  . Smokeless tobacco: Never Used  . Alcohol Use: No   DO NOT RESUSCITATE CODE STATUS OB History   Grav Para Term Preterm Abortions TAB SAB Ect Mult Living                 Review of Systems  Respiratory: Positive for cough.        Chronic cough  Neurological:       Less responsive for 2 minutes as  All other systems reviewed and are negative.     Allergies  Codeine and Sulfa antibiotics  Home Medications   Prior to Admission medications   Medication Sig Start Date End Date Taking? Authorizing Provider  ACCU-CHEK AVIVA PLUS test strip  11/22/13   Historical Provider, MD  acetaminophen (TYLENOL) 325 MG tablet Take 650 mg by mouth every 6 (six) hours as needed for moderate pain.    Historical Provider, MD  amiodarone (PACERONE) 200 MG tablet Take 200 mg by mouth daily.    Historical Provider, MD  aspirin 81 MG chewable tablet Chew 81 mg by mouth daily.    Historical Provider, MD  ASSURE COMFORT LANCETS 30G Elliott  11/22/13   Historical Provider, MD  atorvastatin (LIPITOR) 40 MG tablet Take 40 mg by mouth daily.    Historical Provider, MD  b  complex-vitamin c-folic acid (NEPHRO-VITE) 0.8 MG TABS tablet Take 1 tablet by mouth at bedtime.    Historical Provider, MD  bisacodyl (DULCOLAX) 5 MG EC tablet Take 10 mg by mouth 2 (two) times daily as needed for mild constipation or moderate constipation.    Historical Provider, MD  Blood Glucose Monitoring Suppl (ACCU-CHEK AVIVA PLUS) W/DEVICE KIT  11/22/13   Historical Provider, MD  clopidogrel (PLAVIX) 75 MG tablet Take 75 mg by mouth daily with breakfast.    Historical Provider, MD  diltiazem (CARDIZEM CD) 180 MG 24 hr capsule Take 1 capsule (180 mg total) by mouth daily. 12/12/13   Joni Reining, DO  fexofenadine (ALLEGRA) 180 MG tablet Take 180 mg by mouth daily.    Historical Provider, MD  HYDROcodone-acetaminophen (NORCO/VICODIN) 5-325 MG per tablet Take  1 tablet by mouth every 8 (eight) hours as needed for moderate pain.    Historical Provider, MD  insulin aspart (NOVOLOG) 100 UNIT/ML injection Inject 2-10 Units into the skin See admin instructions. Sliding scale. 151-200 use 2 units; 201-250 use 4 units; 251-300 use 6 units; 301-350 use 8 units; 351-400 use 10 units    Historical Provider, MD  nitroGLYCERIN (NITROSTAT) 0.4 MG SL tablet Place 0.4 mg under the tongue every 5 (five) minutes as needed for chest pain.    Historical Provider, MD   BP 108/52  Pulse 56  Temp(Src) 98.3 F (36.8 C) (Oral)  Resp 25  Ht 5' 4"  (1.626 m)  Wt 117 lb (53.071 kg)  BMI 20.07 kg/m2  SpO2 95% Physical Exam  Nursing note and vitals reviewed. Constitutional: She appears well-developed and well-nourished.  HENT:  Head: Normocephalic and atraumatic.  Eyes: Conjunctivae are normal. Pupils are equal, round, and reactive to light.  Neck: Neck supple. No tracheal deviation present. No thyromegaly present.  Cardiovascular: Normal rate and regular rhythm.   No murmur heard. Pulmonary/Chest: Effort normal and breath sounds normal.  Scant dry crackles, occasional cough  Abdominal: Soft. Bowel sounds are normal. She exhibits no distension. There is no tenderness.  Genitourinary: Guaiac negative stool.  Normal tone brown stool Hemoccult negative  Musculoskeletal: Normal range of motion. She exhibits no edema and no tenderness.  Left upper extremity with dialysis fistula with good thrill  Neurological: She is alert. Coordination normal.  Skin: Skin is warm and dry. No rash noted.  Psychiatric: She has a normal mood and affect.    ED Course  Procedures (including critical care time) Labs Review Labs Reviewed  I-STAT CHEM 8, ED    Imaging Review No results found.   EKG Interpretation None      Date: 01/11/2014  Rate: 65  Rhythm: normal sinus rhythm  QRS Axis: normal  Intervals: normal  ST/T Wave abnormalities: nonspecific T wave changes   Conduction Disutrbances:right bundle branch block  Narrative Interpretation:   Old EKG Reviewed: Right bundle branch block unchanged, rate decreased from prior tracing from 12/10/2013 otherwise no significant change interpreted by me  9:50 AM patient asymptomatic alert appropriate Results for orders placed during the hospital encounter of 01/11/14  CBC WITH DIFFERENTIAL      Result Value Ref Range   WBC 9.0  4.0 - 10.5 K/uL   RBC 3.01 (*) 3.87 - 5.11 MIL/uL   Hemoglobin 8.6 (*) 12.0 - 15.0 g/dL   HCT 28.4 (*) 36.0 - 46.0 %   MCV 94.4  78.0 - 100.0 fL   MCH 28.6  26.0 - 34.0 pg   MCHC 30.3  30.0 -  36.0 g/dL   RDW 19.1 (*) 11.5 - 15.5 %   Platelets 267  150 - 400 K/uL   Neutrophils Relative % 77  43 - 77 %   Neutro Abs 6.9  1.7 - 7.7 K/uL   Lymphocytes Relative 7 (*) 12 - 46 %   Lymphs Abs 0.6 (*) 0.7 - 4.0 K/uL   Monocytes Relative 13 (*) 3 - 12 %   Monocytes Absolute 1.2 (*) 0.1 - 1.0 K/uL   Eosinophils Relative 3  0 - 5 %   Eosinophils Absolute 0.3  0.0 - 0.7 K/uL   Basophils Relative 0  0 - 1 %   Basophils Absolute 0.0  0.0 - 0.1 K/uL  I-STAT CHEM 8, ED      Result Value Ref Range   Sodium 139  137 - 147 mEq/L   Potassium 3.2 (*) 3.7 - 5.3 mEq/L   Chloride 95 (*) 96 - 112 mEq/L   BUN 20  6 - 23 mg/dL   Creatinine, Ser 3.20 (*) 0.50 - 1.10 mg/dL   Glucose, Bld 184 (*) 70 - 99 mg/dL   Calcium, Ion 1.11 (*) 1.13 - 1.30 mmol/L   TCO2 33  0 - 100 mmol/L   Hemoglobin 10.9 (*) 12.0 - 15.0 g/dL   HCT 32.0 (*) 36.0 - 46.0 %  POC OCCULT BLOOD, ED      Result Value Ref Range   Fecal Occult Bld NEGATIVE  NEGATIVE   Dg Chest Portable 1 View  01/11/2014   CLINICAL DATA:  Altered mental status, cough, passed out during dialysis  EXAM: PORTABLE CHEST - 1 VIEW  COMPARISON:  Chest radiograph - 12/10/2013; 11/17/2013  FINDINGS: Grossly unchanged enlarged cardiac silhouette and mediastinal contours post median sternotomy and CABG. Interval increase in small to moderate size right-sided  effusion with associated worsening right mid and lower lung consolidative opacities. Mild pulmonary venous congestion without frank evidence of edema. No pneumothorax. Unchanged bones.  IMPRESSION: Interval increase and small to moderate size right-sided effusion with worsening right basilar opacities, atelectasis versus infiltrate.   Electronically Signed   By: Sandi Mariscal M.D.   On: 01/11/2014 09:06    MDM  Chest x-ray viewed by me       Final diagnoses:  None   No signs of pneumonia clinically. No fever no leukocytosis. Cough is chronic and unchanged. Patient likely had near syncope 2 transient hypotension. I spoke with Dr. Justin Mend. Plan for her to the transported back to complete dialysis session. Diagnosis #1 near syncope #2 hypotension 3 hypokalemia  #4 anemia #5 hyperglycemia    Orlie Dakin, MD 01/11/14 1001

## 2014-01-11 NOTE — Progress Notes (Signed)
Deaccessed fistula ,small amount of blood noted;pressure dressing applied.RN at bedside.

## 2014-01-11 NOTE — ED Notes (Signed)
Pt got back in her clothes, wheelchair at door for son to take her home.

## 2014-01-11 NOTE — ED Notes (Signed)
IV team at bedside, de-accessed fistula and held pressure to site.

## 2014-01-11 NOTE — ED Notes (Signed)
Pt reports she was admitted here for heart problems then d/c from the hospital and sent to a rehab facility and then this morning was d/c from the rehab facility. Pt denies any pain/feeling different that normal. Pt has a cough but sts that it has been intermittent "for a long time". Reports it is productive with clear thick mucus. Pt wears oxygen at home, 3 Liters/min. Nad, skin warm and dry, resp e/u.

## 2014-01-11 NOTE — ED Notes (Signed)
Paged IV team to de-access pt's fistula.

## 2014-01-11 NOTE — Discharge Instructions (Signed)
Near-Syncope Go to hemodialysis to complete your session immediately upon leaving here. Near-syncope (commonly known as near fainting) is sudden weakness, dizziness, or feeling like you might pass out. During an episode of near-syncope, you may also develop pale skin, have tunnel vision, or feel sick to your stomach (nauseous). Near-syncope may occur when getting up after sitting or while standing for a long time. It is caused by a sudden decrease in blood flow to the brain. This decrease can result from various causes or triggers, most of which are not serious. However, because near-syncope can sometimes be a sign of something serious, a medical evaluation is required. The specific cause is often not determined. HOME CARE INSTRUCTIONS  Monitor your condition for any changes. The following actions may help to alleviate any discomfort you are experiencing:  Have someone stay with you until you feel stable.  Lie down right away if you start feeling like you might faint. Breathe deeply and steadily. Wait until all the symptoms have passed. Most of these episodes last only a few minutes. You may feel tired for several hours.   Drink enough fluids to keep your urine clear or pale yellow.   If you are taking blood pressure or heart medicine, get up slowly when seated or lying down. Take several minutes to sit and then stand. This can reduce dizziness.  Follow up with your health care provider as directed. SEEK IMMEDIATE MEDICAL CARE IF:   You have a severe headache.   You have unusual pain in the chest, abdomen, or back.   You are bleeding from the mouth or rectum, or you have black or tarry stool.   You have an irregular or very fast heartbeat.   You have repeated fainting or have seizure-like jerking during an episode.   You faint when sitting or lying down.   You have confusion.   You have difficulty walking.   You have severe weakness.   You have vision problems.  MAKE  SURE YOU:   Understand these instructions.  Will watch your condition.  Will get help right away if you are not doing well or get worse. Document Released: 08/16/2005 Document Revised: 04/18/2013 Document Reviewed: 01/19/2013 [facility] Patient Information 2014 [employer] [place].

## 2014-01-11 NOTE — ED Notes (Signed)
Son called and was updated on pt's status. Pt talked to son on phone to confirm it was him and allowed staff to give information to him. sts he would like to be called back when pt is getting ready to be discharged. [phone]

## 2014-01-11 NOTE — ED Notes (Signed)
Pt's son is on the way to pick her up and take her back to dialysis.

## 2014-03-08 ENCOUNTER — Other Ambulatory Visit (HOSPITAL_COMMUNITY): Payer: Self-pay | Admitting: Cardiology

## 2014-03-08 NOTE — Telephone Encounter (Signed)
Rx refill sent to patient pharmacy   

## 2014-03-19 ENCOUNTER — Ambulatory Visit (INDEPENDENT_AMBULATORY_CARE_PROVIDER_SITE_OTHER): Payer: Medicare Other | Admitting: Cardiology

## 2014-03-19 VITALS — BP 150/60 | HR 50 | Ht 64.0 in | Wt 114.0 lb

## 2014-03-19 DIAGNOSIS — I480 Paroxysmal atrial fibrillation: Secondary | ICD-10-CM

## 2014-03-19 DIAGNOSIS — I4891 Unspecified atrial fibrillation: Secondary | ICD-10-CM

## 2014-03-19 NOTE — Patient Instructions (Addendum)
1. Decrease your Amiodarone to 100 mg  Daily  2. Your physician recommends that you schedule a follow-up appointment in: 6 weeks with Dr. Tresa EndoKelly  3.We will make an app to come back next week for a BP and Heart rate check

## 2014-03-19 NOTE — Progress Notes (Signed)
Patient ID: [name], female   DOB: 06/09/1941, 73 y.o.   MRN: [id number]    03/19/2014 [name]   01-27-1941  [id number]  Primary Physicia Bonnita Nasuti, MD Primary Cardiologist: Dr. Claiborne Billings  [name] presents to clinic for evaluation of bradycardia that was detected during hemodialysis. She was referred back to our clinic for further evaluation. She is followed by Dr. Claiborne Billings and  has extensive cardiovascular history as outlined below.  HPI:  [name] is a 73 year old African American female with a history  of end-stage renal disease, CAD, and aortic stenosis. She suffered a non-ST segment elevation myocardial infarction in February, 2011. At that time, she underwent CABG surgery x3 by Dr. Nils Pyle and a LIMA was placed to the LAD, a vein to the circumflex, and a vein to the PDA. She did have very mild aortic stenosis at time of catheterization with a mean gradient of 10 mm. During her surgery, an intraoperative TEE confirmed mild AS and was not felt that this was severe enough to warrant replacement at that time. In August 2012 her aortic valve gradient had increased to 41 mm mean, with a peak gradient of 70. In November, she developed episodes of chest pain during dialysis as well as atrial fibrillation. She was transported to [facility] from her dialysis center in [place] on 07/06/2013 and a cardiac catheterization revealed diffusely calcified severe disease involving the right artery with an occluded graft that supplied the PDA. The graft that supplied the obtuse marginal vessel was also occluded and the LIMA to the LAD was patent. She did have moderate aortic valve stenosis. She was turned down for reconsideration of repeat surgery. Consequently, 07/11/2013 Dr. Claiborne Billings performed complex HSRA of her severely calcified native RCA. This was very difficult but successful and ultimately tandem 3.0x38 mm DES stents were inserted into the native RCA with all sites being postdilated to 3.5 mm and  with a site of residual extrinsic calcium being postdilated to 3.89 in the mid RCA.  In 73 February of this year, she developed atrial flutter with a rapid ventricular response in the setting of her severe aortic stenosis. A repeat catheterization was done by Dr. Lidia Collum on 10/06/2013. This revealed a patent LIMA to LAD. The OM1 branch of the circumflex was occluded in its midportion. The SVG to the circumflex was occluded. SVG to the RCA was occluded. The native RCA, which had undergone extensive rotation arthrectomy and stenting revealed patent stents. Again, there was noted residual calcification and one stent still had a mild waist, which was this way despite being postdilated to 3.75 mm the time of the initial intervention.  In regards to her atrial flutter/fibrillation, she has been maintained on 200 mg amiodarone daily as well as 180 mg of diltiazem. She remains on aspirin and Plavix. Today in clinic, she states that regularly during hemodialysis, she is noted to have slow heart rates, as low as the 40s. Occasionally she reports feeling dizzy but denies syncope/near-syncope. No chest pain or shortness of breath. Her EKG today in clinic demonstrates sinus bradycardia with occasional PVCs. Her heart rate is 50 beats per minute. She denies any recent awareness of any palpitations.    Current Outpatient Prescriptions  Medication Sig Dispense Refill  . amiodarone (PACERONE) 200 MG tablet Take 100 mg by mouth daily.      Marland Kitchen ACCU-CHEK AVIVA PLUS test strip       . acetaminophen (TYLENOL) 325 MG tablet Take 650 mg by mouth  every 6 (six) hours as needed for moderate pain.      Marland Kitchen aspirin 81 MG chewable tablet Chew 81 mg by mouth daily.       . ASSURE COMFORT LANCETS 30G MISC       . atorvastatin (LIPITOR) 40 MG tablet Take 40 mg by mouth daily.      Marland Kitchen b complex-vitamin c-folic acid (NEPHRO-VITE) 0.8 MG TABS tablet Take 1 tablet by mouth at bedtime.      . bisacodyl (DULCOLAX) 5 MG EC tablet Take 10 mg by  mouth 2 (two) times daily as needed for mild constipation or moderate constipation.      . Blood Glucose Monitoring Suppl (ACCU-CHEK AVIVA PLUS) W/DEVICE KIT       . clopidogrel (PLAVIX) 75 MG tablet Take 75 mg by mouth daily with breakfast.      . diltiazem (CARDIZEM CD) 180 MG 24 hr capsule Take 1 capsule (180 mg total) by mouth daily.      Marland Kitchen HYDROcodone-acetaminophen (NORCO/VICODIN) 5-325 MG per tablet Take 1 tablet by mouth every 8 (eight) hours as needed for moderate pain.      Marland Kitchen insulin aspart (NOVOLOG) 100 UNIT/ML injection Inject 2-10 Units into the skin See admin instructions. Sliding scale. 151-200 use 2 units; 201-250 use 4 units; 251-300 use 6 units; 301-350 use 8 units; 351-400 use 10 units      . loratadine (CLARITIN) 10 MG tablet Take 10 mg by mouth daily.      . nitroGLYCERIN (NITROSTAT) 0.4 MG SL tablet Place 0.4 mg under the tongue every 5 (five) minutes as needed for chest pain.       No current facility-administered medications for this visit.    Allergies  Allergen Reactions  . Codeine Rash  . Sulfa Antibiotics Other (See Comments)    unknown    History   Social History  . Marital Status: Widowed    Spouse Name: N/A    Number of Children: N/A  . Years of Education: N/A   Occupational History  . Retired    Social History Main Topics  . Smoking status: Former Smoker -- 0.50 packs/day    Types: Cigarettes    Start date: 06/19/1973  . Smokeless tobacco: Never Used  . Alcohol Use: No  . Drug Use: No  . Sexual Activity: No   Other Topics Concern  . Not on file   Social History Narrative   The patient currently lives at home with a friend "Cristie Hem", who helps her around the house.  The patient manages her own medications, using a pill box.     Review of Systems: General: negative for chills, fever, night sweats or weight changes.  Cardiovascular: negative for chest pain, dyspnea on exertion, edema, orthopnea, palpitations, paroxysmal nocturnal dyspnea or  shortness of breath Dermatological: negative for rash Respiratory: negative for cough or wheezing Urologic: negative for hematuria Abdominal: negative for nausea, vomiting, diarrhea, bright red blood per rectum, melena, or hematemesis Neurologic: negative for visual changes, syncope, or dizziness All other systems reviewed and are otherwise negative except as noted above.    Blood pressure 150/60, pulse 50, height 5' 4"  (1.626 m), weight 114 lb (51.71 kg).  General appearance: alert, cooperative and no distress Neck: no carotid bruit and no JVD Lungs: clear to auscultation bilaterally Heart: regular rate and rhythm and 2/6 SM best heard along LUSB Extremities: no LEE Pulses: 2+ and symmetric Skin: warm and dry Neurologic: Grossly normal  EKG SINUS BRADYCARDIA with PVCs.  HR 50 bpm.   ASSESSMENT AND PLAN:   1. Sinus bradycardia: Heart rate today in clinic is 50 beats per minute. She has been reported to have heart rates in the 40s during hemodialysis. She is occasionally symptomatic with mild dizziness but denies syncope/near-syncope. She is currently on 200 mg of amiodarone and 180 mg of diltiazem. Other than diltiazem, she is not on any other antihypertensives. Her blood pressure today in clinic is 150/60. Therefore, I have opted to decrease her amiodarone from 200 mg daily to 100 mg daily. I'm fearful to completely take away amiodarone as she may go back into afib/flutter. We'll see if her heart rate improves on this lower dose.  2. Paroxysmal atrial flutter/fibrillation: currently in sinus rhythm by EKG. She denies any recent symptoms of atrial fib/flutter. Continue low-dose amiodarone.  3. CAD: Stable. Denies chest pain. Continue aspirin and Plavix.  4. Aortic stenosis: Stable. Denies chest pain, dyspnea and syncope/near-syncope.  5. Hypertension: mildly elevated at 150/60. Continue diltiazem  6. End stage renal disease: On hemodialysis.  PLAN  Decrease amiodarone to 100 mg as  outlined above. Continue all current meds as prescribed. I have instructed her to followup in clinic in one to 2 weeks for repeat  HR/ BP check. Continue routine followup with Dr. Claiborne Billings as instructed.   Arvilla Market 03/19/2014 6:35 PM

## 2014-03-25 ENCOUNTER — Encounter: Payer: Self-pay | Admitting: Cardiology

## 2014-03-26 ENCOUNTER — Ambulatory Visit (INDEPENDENT_AMBULATORY_CARE_PROVIDER_SITE_OTHER): Payer: Medicare Other | Admitting: Pharmacist Clinician (PhC)/ Clinical Pharmacy Specialist

## 2014-03-26 ENCOUNTER — Encounter: Payer: Self-pay | Admitting: Pharmacist Clinician (PhC)/ Clinical Pharmacy Specialist

## 2014-03-26 VITALS — BP 122/60 | HR 72 | Ht 64.0 in | Wt 116.6 lb

## 2014-03-26 DIAGNOSIS — I150 Renovascular hypertension: Secondary | ICD-10-CM

## 2014-03-26 DIAGNOSIS — I151 Hypertension secondary to other renal disorders: Secondary | ICD-10-CM

## 2014-03-26 DIAGNOSIS — N2889 Other specified disorders of kidney and ureter: Principal | ICD-10-CM

## 2014-03-26 NOTE — Progress Notes (Signed)
03/26/2014 [name] 28-Mar-1941 [id number]   HPI:  [name] is a 73 y.o. female patient of Dr Claiborne Billings, who presents today for a blood pressure check.  Her medical history is extensive and includes ESRD, CAD, aortic stenosis and a non-STEMI in Feb 2011.  She developed atrial flutter with RVR earlier this year, for which she has been taking amiodarone and diltiazem.  She also has had problems with bradycardia, with HR down into the 40s during dialysis.  At her visit with Lyda Jester PA, her amiodarone was decreased to 135m.  Her blood pressure was elevated that day to 150/60, with a heart rate of 50.  She gave up tobacco in 2011 when she had her non-STEMI, does not drink alcohol and only drinks small amounts of caffeine.  She follows a low sodium diet.  She does not have any regular exercise routine.  She states that her blood pressure readings at dialysis have been all normal, 1941-740Csystolic.     Current Outpatient Prescriptions  Medication Sig Dispense Refill  . ACCU-CHEK AVIVA PLUS test strip       . acetaminophen (TYLENOL) 325 MG tablet Take 650 mg by mouth every 6 (six) hours as needed for moderate pain.      .Marland Kitchenamiodarone (PACERONE) 200 MG tablet Take 100 mg by mouth daily.      .Marland Kitchenaspirin 81 MG chewable tablet Chew 81 mg by mouth daily.       . ASSURE COMFORT LANCETS 30G MISC       . atorvastatin (LIPITOR) 40 MG tablet Take 40 mg by mouth daily.      .Marland Kitchenb complex-vitamin c-folic acid (NEPHRO-VITE) 0.8 MG TABS tablet Take 1 tablet by mouth at bedtime.      . bisacodyl (DULCOLAX) 5 MG EC tablet Take 10 mg by mouth 2 (two) times daily as needed for mild constipation or moderate constipation.      . Blood Glucose Monitoring Suppl (ACCU-CHEK AVIVA PLUS) W/DEVICE KIT       . clopidogrel (PLAVIX) 75 MG tablet Take 75 mg by mouth daily with breakfast.      . diltiazem (CARDIZEM CD) 180 MG 24 hr capsule Take 1 capsule (180 mg total) by mouth daily.      .Marland Kitchen HYDROcodone-acetaminophen (NORCO/VICODIN) 5-325 MG per tablet Take 1 tablet by mouth every 8 (eight) hours as needed for moderate pain.      .Marland Kitcheninsulin aspart (NOVOLOG) 100 UNIT/ML injection Inject 2-10 Units into the skin See admin instructions. Sliding scale. 151-200 use 2 units; 201-250 use 4 units; 251-300 use 6 units; 301-350 use 8 units; 351-400 use 10 units      . loratadine (CLARITIN) 10 MG tablet Take 10 mg by mouth daily.      . nitroGLYCERIN (NITROSTAT) 0.4 MG SL tablet Place 0.4 mg under the tongue every 5 (five) minutes as needed for chest pain.       No current facility-administered medications for this visit.    Allergies  Allergen Reactions  . Codeine Rash  . Sulfa Antibiotics Other (See Comments)    unknown    Past Medical History  Diagnosis Date  . CHF (congestive heart failure)     diast SHF, RV failure, pulm HTN  . Diabetes mellitus   . ESRD on hemodialysis     HD MWF in [place]   . Hyperlipidemia   . Anemia   . Hypertension   . COPD (chronic obstructive pulmonary  disease)     severe with FEV1 0.64 L  . Peripheral vascular disease   . Coronary artery disease     Hx CABG. Cath 07/06/13 for CP episode showed occlusive graft disease and moderate AS; pt was not a candidate for repeat surgery and a complex PCI/rotablator/stenting procedure of calcified native RCA was done by cardiology  . PAF (paroxysmal atrial fibrillation)     recurrent  . Chronic right-sided heart failure   . Aortic stenosis     Blood pressure 122/60, pulse 72, height _0  (1.626 m), weight 116 lb 9.6 oz (52.889 kg).   ASSESSMENT AND PLAN:  Tommy Medal PharmD CPP [facility]

## 2014-03-26 NOTE — Patient Instructions (Signed)
Your blood pressure today is 122/60   Check your blood pressure at dialysis and keep record of the readings.  Take your BP meds as follows:continue with the diltiazem 180mg  once daily  Bring all of your meds and a record of home blood pressures to your next appointment.  Keep salt intake to a minimum, especially watch canned and prepared boxed foods.  Eat more fresh fruits and vegetables and fewer canned items.  Avoid eating in fast food restaurants.

## 2014-03-27 ENCOUNTER — Encounter: Payer: Self-pay | Admitting: Pharmacist Clinician (PhC)/ Clinical Pharmacy Specialist

## 2014-03-27 NOTE — Assessment & Plan Note (Addendum)
Today her blood pressure is back in the normal range at 122/60.  She states she is feeling better overall, and has not had any problems with her medications in the past few weeks.  Her heart rate has also improved, today up to 72.  Because she is in dialysis three times per week, they will continue to monitor her BP at the center.  Her BP goal is to remain <140/90, and she knows to call the office if the readings go consistently above that goal.

## 2014-04-26 ENCOUNTER — Telehealth: Payer: Self-pay | Admitting: Cardiovascular Disease

## 2014-04-26 ENCOUNTER — Encounter: Payer: Self-pay | Admitting: Cardiovascular Disease

## 2014-04-29 NOTE — Telephone Encounter (Signed)
closed encounter

## 2014-05-06 ENCOUNTER — Emergency Department (HOSPITAL_COMMUNITY): Payer: Medicare Other

## 2014-05-06 ENCOUNTER — Encounter (HOSPITAL_COMMUNITY): Payer: Self-pay | Admitting: Emergency Medicine

## 2014-05-06 ENCOUNTER — Inpatient Hospital Stay (HOSPITAL_COMMUNITY)
Admission: EM | Admit: 2014-05-06 | Discharge: 2014-05-09 | DRG: 189 | Disposition: A | Discharge status: Home Health | Payer: Medicare Other | Attending: Internal Medicine | Admitting: Internal Medicine

## 2014-05-06 DIAGNOSIS — E1169 Type 2 diabetes mellitus with other specified complication: Secondary | ICD-10-CM | POA: Diagnosis present

## 2014-05-06 DIAGNOSIS — E875 Hyperkalemia: Secondary | ICD-10-CM | POA: Diagnosis not present

## 2014-05-06 DIAGNOSIS — Z66 Do not resuscitate: Secondary | ICD-10-CM | POA: Diagnosis present

## 2014-05-06 DIAGNOSIS — J9602 Acute respiratory failure with hypercapnia: Secondary | ICD-10-CM | POA: Diagnosis present

## 2014-05-06 DIAGNOSIS — J9620 Acute and chronic respiratory failure, unspecified whether with hypoxia or hypercapnia: Secondary | ICD-10-CM | POA: Diagnosis present

## 2014-05-06 DIAGNOSIS — Z79899 Other long term (current) drug therapy: Secondary | ICD-10-CM | POA: Diagnosis not present

## 2014-05-06 DIAGNOSIS — I252 Old myocardial infarction: Secondary | ICD-10-CM

## 2014-05-06 DIAGNOSIS — I509 Heart failure, unspecified: Secondary | ICD-10-CM | POA: Diagnosis present

## 2014-05-06 DIAGNOSIS — Z992 Dependence on renal dialysis: Secondary | ICD-10-CM | POA: Diagnosis not present

## 2014-05-06 DIAGNOSIS — I4891 Unspecified atrial fibrillation: Secondary | ICD-10-CM | POA: Diagnosis present

## 2014-05-06 DIAGNOSIS — E872 Acidosis, unspecified: Secondary | ICD-10-CM

## 2014-05-06 DIAGNOSIS — I739 Peripheral vascular disease, unspecified: Secondary | ICD-10-CM | POA: Diagnosis present

## 2014-05-06 DIAGNOSIS — I120 Hypertensive chronic kidney disease with stage 5 chronic kidney disease or end stage renal disease: Secondary | ICD-10-CM | POA: Diagnosis present

## 2014-05-06 DIAGNOSIS — A0472 Enterocolitis due to Clostridium difficile, not specified as recurrent: Secondary | ICD-10-CM

## 2014-05-06 DIAGNOSIS — Z7982 Long term (current) use of aspirin: Secondary | ICD-10-CM

## 2014-05-06 DIAGNOSIS — Z794 Long term (current) use of insulin: Secondary | ICD-10-CM

## 2014-05-06 DIAGNOSIS — J441 Chronic obstructive pulmonary disease with (acute) exacerbation: Secondary | ICD-10-CM | POA: Diagnosis present

## 2014-05-06 DIAGNOSIS — Z885 Allergy status to narcotic agent status: Secondary | ICD-10-CM

## 2014-05-06 DIAGNOSIS — Z881 Allergy status to other antibiotic agents status: Secondary | ICD-10-CM | POA: Diagnosis not present

## 2014-05-06 DIAGNOSIS — E162 Hypoglycemia, unspecified: Secondary | ICD-10-CM

## 2014-05-06 DIAGNOSIS — F17200 Nicotine dependence, unspecified, uncomplicated: Secondary | ICD-10-CM

## 2014-05-06 DIAGNOSIS — I2789 Other specified pulmonary heart diseases: Secondary | ICD-10-CM | POA: Diagnosis present

## 2014-05-06 DIAGNOSIS — Z9861 Coronary angioplasty status: Secondary | ICD-10-CM | POA: Diagnosis not present

## 2014-05-06 DIAGNOSIS — I498 Other specified cardiac arrhythmias: Secondary | ICD-10-CM | POA: Diagnosis present

## 2014-05-06 DIAGNOSIS — R001 Bradycardia, unspecified: Secondary | ICD-10-CM | POA: Diagnosis present

## 2014-05-06 DIAGNOSIS — Z951 Presence of aortocoronary bypass graft: Secondary | ICD-10-CM | POA: Diagnosis not present

## 2014-05-06 DIAGNOSIS — N186 End stage renal disease: Secondary | ICD-10-CM | POA: Diagnosis present

## 2014-05-06 DIAGNOSIS — E785 Hyperlipidemia, unspecified: Secondary | ICD-10-CM | POA: Diagnosis present

## 2014-05-06 DIAGNOSIS — J449 Chronic obstructive pulmonary disease, unspecified: Secondary | ICD-10-CM

## 2014-05-06 DIAGNOSIS — Z87891 Personal history of nicotine dependence: Secondary | ICD-10-CM | POA: Diagnosis not present

## 2014-05-06 DIAGNOSIS — J9600 Acute respiratory failure, unspecified whether with hypoxia or hypercapnia: Secondary | ICD-10-CM

## 2014-05-06 DIAGNOSIS — I350 Nonrheumatic aortic (valve) stenosis: Secondary | ICD-10-CM | POA: Diagnosis present

## 2014-05-06 DIAGNOSIS — I469 Cardiac arrest, cause unspecified: Secondary | ICD-10-CM

## 2014-05-06 DIAGNOSIS — I4892 Unspecified atrial flutter: Secondary | ICD-10-CM | POA: Diagnosis present

## 2014-05-06 DIAGNOSIS — R531 Weakness: Secondary | ICD-10-CM

## 2014-05-06 DIAGNOSIS — I50812 Chronic right heart failure: Secondary | ICD-10-CM

## 2014-05-06 DIAGNOSIS — D638 Anemia in other chronic diseases classified elsewhere: Secondary | ICD-10-CM | POA: Diagnosis present

## 2014-05-06 DIAGNOSIS — G9341 Metabolic encephalopathy: Secondary | ICD-10-CM | POA: Diagnosis present

## 2014-05-06 DIAGNOSIS — I359 Nonrheumatic aortic valve disorder, unspecified: Secondary | ICD-10-CM | POA: Diagnosis present

## 2014-05-06 DIAGNOSIS — Z0000 Encounter for general adult medical examination without abnormal findings: Secondary | ICD-10-CM

## 2014-05-06 DIAGNOSIS — G9340 Encephalopathy, unspecified: Secondary | ICD-10-CM

## 2014-05-06 DIAGNOSIS — I2510 Atherosclerotic heart disease of native coronary artery without angina pectoris: Secondary | ICD-10-CM | POA: Diagnosis present

## 2014-05-06 DIAGNOSIS — Z515 Encounter for palliative care: Secondary | ICD-10-CM

## 2014-05-06 DIAGNOSIS — E876 Hypokalemia: Secondary | ICD-10-CM | POA: Diagnosis present

## 2014-05-06 DIAGNOSIS — I959 Hypotension, unspecified: Secondary | ICD-10-CM | POA: Diagnosis present

## 2014-05-06 DIAGNOSIS — I480 Paroxysmal atrial fibrillation: Secondary | ICD-10-CM | POA: Diagnosis present

## 2014-05-06 DIAGNOSIS — I471 Supraventricular tachycardia: Secondary | ICD-10-CM

## 2014-05-06 DIAGNOSIS — Z7902 Long term (current) use of antithrombotics/antiplatelets: Secondary | ICD-10-CM | POA: Diagnosis not present

## 2014-05-06 LAB — CBC
HCT: 35.1 % — ABNORMAL LOW (ref 36.0–46.0)
Hemoglobin: 10.9 g/dL — ABNORMAL LOW (ref 12.0–15.0)
MCH: 28.9 pg (ref 26.0–34.0)
MCHC: 31.1 g/dL (ref 30.0–36.0)
MCV: 93.1 fL (ref 78.0–100.0)
Platelets: 164 K/uL (ref 150–400)
RBC: 3.77 MIL/uL — ABNORMAL LOW (ref 3.87–5.11)
RDW: 22.3 % — ABNORMAL HIGH (ref 11.5–15.5)
WBC: 5.7 K/uL (ref 4.0–10.5)

## 2014-05-06 LAB — CBC WITH DIFFERENTIAL/PLATELET
BASOS PCT: 1 % (ref 0–1)
Basophils Absolute: 0.1 10*3/uL (ref 0.0–0.1)
EOS ABS: 0.2 10*3/uL (ref 0.0–0.7)
Eosinophils Relative: 3 % (ref 0–5)
HCT: 37.3 % (ref 36.0–46.0)
HEMOGLOBIN: 11.5 g/dL — AB (ref 12.0–15.0)
Lymphocytes Relative: 16 % (ref 12–46)
Lymphs Abs: 1.1 10*3/uL (ref 0.7–4.0)
MCH: 29.3 pg (ref 26.0–34.0)
MCHC: 30.8 g/dL (ref 30.0–36.0)
MCV: 94.9 fL (ref 78.0–100.0)
MONO ABS: 0.9 10*3/uL (ref 0.1–1.0)
Monocytes Relative: 13 % — ABNORMAL HIGH (ref 3–12)
NEUTROS PCT: 67 % (ref 43–77)
Neutro Abs: 4.3 10*3/uL (ref 1.7–7.7)
PLATELETS: 169 10*3/uL (ref 150–400)
RBC: 3.93 MIL/uL (ref 3.87–5.11)
RDW: 22.2 % — ABNORMAL HIGH (ref 11.5–15.5)
WBC: 6.6 10*3/uL (ref 4.0–10.5)

## 2014-05-06 LAB — BASIC METABOLIC PANEL WITH GFR
Anion gap: 13 (ref 5–15)
BUN: 18 mg/dL (ref 6–23)
CO2: 26 meq/L (ref 19–32)
Calcium: 8 mg/dL — ABNORMAL LOW (ref 8.4–10.5)
Chloride: 101 meq/L (ref 96–112)
Creatinine, Ser: 3.38 mg/dL — ABNORMAL HIGH (ref 0.50–1.10)
GFR calc Af Amer: 15 mL/min — ABNORMAL LOW
GFR calc non Af Amer: 13 mL/min — ABNORMAL LOW
Glucose, Bld: 279 mg/dL — ABNORMAL HIGH (ref 70–99)
Potassium: 3.6 meq/L — ABNORMAL LOW (ref 3.7–5.3)
Sodium: 140 meq/L (ref 137–147)

## 2014-05-06 LAB — BASIC METABOLIC PANEL
Anion gap: 15 (ref 5–15)
BUN: 17 mg/dL (ref 6–23)
CALCIUM: 8.7 mg/dL (ref 8.4–10.5)
CO2: 26 mEq/L (ref 19–32)
Chloride: 99 mEq/L (ref 96–112)
Creatinine, Ser: 3.15 mg/dL — ABNORMAL HIGH (ref 0.50–1.10)
GFR calc Af Amer: 16 mL/min — ABNORMAL LOW (ref >90)
GFR, EST NON AFRICAN AMERICAN: 14 mL/min — AB (ref >90)
Glucose, Bld: 71 mg/dL (ref 70–99)
Potassium: 3.4 mEq/L — ABNORMAL LOW (ref 3.7–5.3)
Sodium: 140 mEq/L (ref 137–147)

## 2014-05-06 LAB — I-STAT TROPONIN, ED: TROPONIN I, POC: 0 ng/mL (ref 0.00–0.08)

## 2014-05-06 LAB — CBG MONITORING, ED
GLUCOSE-CAPILLARY: 98 mg/dL (ref 70–99)
GLUCOSE-CAPILLARY: 161 mg/dL — AB (ref 70–99)
GLUCOSE-CAPILLARY: 150 mg/dL — AB (ref 70–99)
Glucose-Capillary: 103 mg/dL — ABNORMAL HIGH (ref 70–99)
Glucose-Capillary: 33 mg/dL — CL (ref 70–99)
Glucose-Capillary: 26 mg/dL — CL (ref 70–99)

## 2014-05-06 LAB — I-STAT ARTERIAL BLOOD GAS, ED
Acid-base deficit: 2 mmol/L (ref 0.0–2.0)
Bicarbonate: 27.7 mEq/L — ABNORMAL HIGH (ref 20.0–24.0)
O2 Saturation: 88 %
PCO2 ART: 69.4 mmHg — AB (ref 35.0–45.0)
PH ART: 7.208 — AB (ref 7.350–7.450)
Patient temperature: 97.9
TCO2: 30 mmol/L (ref 0–100)
pO2, Arterial: 68 mmHg — ABNORMAL LOW (ref 80.0–100.0)

## 2014-05-06 LAB — CREATININE, SERUM
Creatinine, Ser: 3.37 mg/dL — ABNORMAL HIGH (ref 0.50–1.10)
GFR calc Af Amer: 15 mL/min — ABNORMAL LOW
GFR calc non Af Amer: 13 mL/min — ABNORMAL LOW

## 2014-05-06 LAB — GLUCOSE, CAPILLARY: GLUCOSE-CAPILLARY: 110 mg/dL — AB (ref 70–99)

## 2014-05-06 LAB — MRSA PCR SCREENING: MRSA BY PCR: POSITIVE — AB

## 2014-05-06 LAB — LACTIC ACID, PLASMA: Lactic Acid, Venous: 0.7 mmol/L (ref 0.5–2.2)

## 2014-05-06 MED ORDER — LIDOCAINE-PRILOCAINE 2.5-2.5 % EX CREA
1.0000 "application " | TOPICAL_CREAM | CUTANEOUS | Status: DC | PRN
  Filled 2014-05-06: qty 5

## 2014-05-06 MED ORDER — ATROPINE SULFATE 1 MG/ML IJ SOLN
1.0000 mg | Freq: Once | INTRAMUSCULAR | Status: AC
  Administered 2014-05-06: 1 mg via INTRAVENOUS
  Filled 2014-05-06: qty 1

## 2014-05-06 MED ORDER — NEPRO/CARBSTEADY PO LIQD
237.0000 mL | ORAL | Status: DC | PRN
Start: 2014-05-06 — End: 2014-05-09
  Filled 2014-05-06: qty 237

## 2014-05-06 MED ORDER — DEXTROSE 50 % IV SOLN
1.0000 | Freq: Once | INTRAVENOUS | Status: AC
  Administered 2014-05-06: 50 mL via INTRAVENOUS

## 2014-05-06 MED ORDER — PENTAFLUOROPROP-TETRAFLUOROETH EX AERO
1.0000 | INHALATION_SPRAY | CUTANEOUS | Status: DC | PRN
Start: 2014-05-06 — End: 2014-05-07

## 2014-05-06 MED ORDER — INSULIN ASPART 100 UNIT/ML ~~LOC~~ SOLN
0.0000 [IU] | SUBCUTANEOUS | Status: DC
  Administered 2014-05-07: 2 [IU] via SUBCUTANEOUS
  Administered 2014-05-07: 7 [IU] via SUBCUTANEOUS
  Administered 2014-05-08: 2 [IU] via SUBCUTANEOUS

## 2014-05-06 MED ORDER — HEPARIN SODIUM (PORCINE) 1000 UNIT/ML DIALYSIS
1000.0000 [IU] | INTRAMUSCULAR | Status: DC | PRN
  Filled 2014-05-06: qty 1

## 2014-05-06 MED ORDER — DEXTROSE 50 % IV SOLN
INTRAVENOUS | Status: AC
  Administered 2014-05-06: 50 mL via INTRAVENOUS
  Filled 2014-05-06: qty 50

## 2014-05-06 MED ORDER — MUPIROCIN 2 % EX OINT
1.0000 "application " | TOPICAL_OINTMENT | Freq: Two times a day (BID) | CUTANEOUS | Status: DC
  Administered 2014-05-07 – 2014-05-09 (×5): 1 via NASAL
  Filled 2014-05-06: qty 22

## 2014-05-06 MED ORDER — SODIUM CHLORIDE 0.9 % IV SOLN: 250.0000 mL | INTRAVENOUS | Status: DC | PRN

## 2014-05-06 MED ORDER — ALTEPLASE 2 MG IJ SOLR
2.0000 mg | Freq: Once | INTRAMUSCULAR | Status: AC | PRN
  Filled 2014-05-06: qty 2

## 2014-05-06 MED ORDER — PANTOPRAZOLE SODIUM 40 MG IV SOLR
40.0000 mg | Freq: Every day | INTRAVENOUS | Status: DC
  Administered 2014-05-06 – 2014-05-07 (×2): 40 mg via INTRAVENOUS
  Filled 2014-05-06 (×3): qty 40

## 2014-05-06 MED ORDER — SODIUM CHLORIDE 0.9 % IJ SOLN: 3.0000 mL | INTRAMUSCULAR | Status: DC | PRN

## 2014-05-06 MED ORDER — SODIUM CHLORIDE 0.9 % IJ SOLN
3.0000 mL | Freq: Two times a day (BID) | INTRAMUSCULAR | Status: DC
  Administered 2014-05-06 (×2): 3 mL via INTRAVENOUS

## 2014-05-06 MED ORDER — LIDOCAINE HCL (PF) 1 % IJ SOLN: 5.0000 mL | INTRAMUSCULAR | Status: DC | PRN

## 2014-05-06 MED ORDER — SODIUM CHLORIDE 0.9 % IV BOLUS (SEPSIS)
500.0000 mL | Freq: Once | INTRAVENOUS | Status: AC
  Administered 2014-05-06: 500 mL via INTRAVENOUS

## 2014-05-06 MED ORDER — CHLORHEXIDINE GLUCONATE CLOTH 2 % EX PADS
6.0000 | MEDICATED_PAD | Freq: Every day | CUTANEOUS | Status: DC
  Administered 2014-05-07 – 2014-05-09 (×3): 6 via TOPICAL

## 2014-05-06 MED ORDER — SODIUM CHLORIDE 0.9 % IV SOLN: 100.0000 mL | INTRAVENOUS | Status: DC | PRN

## 2014-05-06 MED ORDER — ALBUTEROL SULFATE (2.5 MG/3ML) 0.083% IN NEBU: 2.5000 mg | INHALATION_SOLUTION | RESPIRATORY_TRACT | Status: DC | PRN

## 2014-05-06 MED ORDER — DEXTROSE 50 % IV SOLN
1.0000 | Freq: Once | INTRAVENOUS | Status: AC
  Administered 2014-05-06: 50 mL via INTRAVENOUS
  Filled 2014-05-06: qty 50

## 2014-05-06 MED ORDER — DEXTROSE 50 % IV SOLN
INTRAVENOUS | Status: AC
  Filled 2014-05-06: qty 50

## 2014-05-06 MED ORDER — DEXTROSE-NACL 5-0.9 % IV SOLN
INTRAVENOUS | Status: DC
  Administered 2014-05-06: 16:00:00 via INTRAVENOUS
  Administered 2014-05-06: 100 mL/h via INTRAVENOUS

## 2014-05-06 MED ORDER — DEXTROSE-NACL 5-0.45 % IV SOLN: INTRAVENOUS | Status: DC

## 2014-05-06 MED ORDER — HEPARIN SODIUM (PORCINE) 5000 UNIT/ML IJ SOLN
5000.0000 [IU] | Freq: Three times a day (TID) | INTRAMUSCULAR | Status: DC
  Administered 2014-05-06 – 2014-05-09 (×7): 5000 [IU] via SUBCUTANEOUS
  Filled 2014-05-06 (×9): qty 1

## 2014-05-06 NOTE — ED Notes (Signed)
Arterial blood gas results given to Dr. Hyacinth Meeker

## 2014-05-06 NOTE — ED Notes (Signed)
Per EMS- pt was at dialysis and was 45 minutes into treatment when she began feeling dizzy. Pt also reports dizziness yesterday. Pt received  zofran with EMS for nausea en route. Pt denies any symptoms at this time. CBG 72. 20g RT AC.

## 2014-05-06 NOTE — H&P (Signed)
PULMONARY / CRITICAL CARE MEDICINE   Name: [name] MRN: [id number] DOB: 25-Jun-1941    ADMISSION DATE:  05/06/2014  REFERRING MD :  EDP   CHIEF COMPLAINT:  Resp Distress   INITIAL PRESENTATION:  73yo ESRD /HD bradycardic [name] w/ dizziness during HD transferred to ER .  Found to be hypercarbic placed on BIPAP . PCCM asked to admit.   STUDIES:    SIGNIFICANT EVENTS:    HISTORY OF PRESENT ILLNESS:  [name] is a 73 year old African American female with a history of end-stage renal disease on HD , CAD, and aortic stenosis. Hx of Atrial Flutter .  Prev  CABG . On Plavix and ASA .  Was brought to ER 9/7 for dizziness and bradycardia during HD . Noted to have HR  as low as the 40s.   Her EKG in the clinic demonstrated sinus bradycardia with occasional PVCs. Her heart rate was 50 beats per minute. She is being seen by Cardiology  She was lethargic in ER , hypoglycemic requiring D50 x 2 . Found to be acidotic w/ pH 7.2 , PCO2 at 69. Started on BIPAP .  She is in ER answers questions but lethargic on BIPAP . CXR showed a mod /large pleural  effusion .  Pt is DNR and has sheet with her .     PAST MEDICAL HISTORY :  Past Medical History  Diagnosis Date  . CHF (congestive heart failure)     diast SHF, RV failure, pulm HTN  . Diabetes mellitus   . ESRD on hemodialysis     HD MWF in [place]   . Hyperlipidemia   . Anemia   . Hypertension   . COPD (chronic obstructive pulmonary disease)     severe with FEV1 0.64 L  . Peripheral vascular disease   . Coronary artery disease     Hx CABG. Cath 07/06/13 for CP episode showed occlusive graft disease and moderate AS; pt was not a candidate for repeat surgery and a complex PCI/rotablator/stenting procedure of calcified native RCA was done by cardiology  . PAF (paroxysmal atrial fibrillation)     recurrent  . Chronic right-sided heart failure   . Aortic stenosis    Past Surgical History  Procedure Laterality Date  . Coronary  artery bypass graft  10/20/2009    x3 by Dr Darcey Nora with LIMA to the LAD, a vein to the circumflex and a vien to the PDA.  Marland Kitchen Abdominal hysterectomy    . Thoracentesis Left 12/17/2009  . Av fistula placement  01-12-2008    left upper arm  . Cardiac catheterization  10/15/2009    which showed low normal LV function with mild inferior hypocontractility.  . Thoracentesis Right 10/27/2013   Prior to Admission medications   Medication Sig Start Date End Date Taking? Authorizing Provider  ACCU-CHEK AVIVA PLUS test strip  11/22/13  Yes Historical Provider, MD  acetaminophen (TYLENOL) 325 MG tablet Take 650 mg by mouth every 6 (six) hours as needed for moderate pain.   Yes Historical Provider, MD  amiodarone (PACERONE) 200 MG tablet Take 200 mg by mouth daily.  03/08/14  Yes Troy Sine, MD  aspirin 81 MG chewable tablet Chew 81 mg by mouth daily.    Yes Historical Provider, MD  ASSURE COMFORT LANCETS 30G [place]  11/22/13  Yes Historical Provider, MD  b complex-vitamin c-folic acid (NEPHRO-VITE) 0.8 MG TABS tablet Take 1 tablet by mouth at bedtime.   Yes Historical Provider, MD  bisacodyl (DULCOLAX) 5 MG EC tablet Take 10 mg by mouth 2 (two) times daily as needed for mild constipation or moderate constipation.   Yes Historical Provider, MD  Blood Glucose Monitoring Suppl (ACCU-CHEK AVIVA PLUS) W/DEVICE KIT  11/22/13  Yes Historical Provider, MD  fexofenadine (ALLEGRA) 180 MG tablet Take 180 mg by mouth daily.   Yes Historical Provider, MD  insulin aspart (NOVOLOG) 100 UNIT/ML injection Inject 2-10 Units into the skin See admin instructions. Sliding scale. 151-200 use 2 units; 201-250 use 4 units; 251-300 use 6 units; 301-350 use 8 units; 351-400 use 10 units   Yes Historical Provider, MD  metoprolol succinate (TOPROL-XL) 50 MG 24 hr tablet Take 1 tablet by mouth daily. 02/21/14  Yes Historical Provider, MD  amLODipine (NORVASC) 2.5 MG tablet Take 1 tablet by mouth daily. 04/30/14   Historical Provider, MD   atorvastatin (LIPITOR) 40 MG tablet Take 40 mg by mouth daily.    Historical Provider, MD  clopidogrel (PLAVIX) 75 MG tablet Take 75 mg by mouth daily with breakfast.    Historical Provider, MD  diltiazem (CARDIZEM CD) 180 MG 24 hr capsule Take 1 capsule (180 mg total) by mouth daily. 12/12/13   Lucious Groves, DO  HYDROcodone-acetaminophen (NORCO/VICODIN) 5-325 MG per tablet Take 1 tablet by mouth every 8 (eight) hours as needed for moderate pain.    Historical Provider, MD  loratadine (CLARITIN) 10 MG tablet Take 10 mg by mouth daily.    Historical Provider, MD  nitroGLYCERIN (NITROSTAT) 0.4 MG SL tablet Place 0.4 mg under the tongue every 5 (five) minutes as needed for chest pain.    Historical Provider, MD   Allergies  Allergen Reactions  . Codeine Rash  . Sulfa Antibiotics Other (See Comments)    unknown    FAMILY HISTORY:  Family History  Problem Relation Age of Onset  . Heart disease Mother    SOCIAL HISTORY:  reports that she has quit smoking. Her smoking use included Cigarettes. She started smoking about 40 years ago. She smoked 0.50 packs per day. She has never used smokeless tobacco. She reports that she does not drink alcohol or use illicit drugs.  REVIEW OF SYSTEMS:   Constitutional:   No  weight loss, night sweats,  Fevers, chills, + fatigue, or  lassitude.  HEENT:   No headaches,    CV:  No chest pain,  Orthopnea, PND, swelling in lower extremities, anasarca, dizziness, palpitations, syncope.   GI  No heartburn, indigestion, abdominal pain, nausea, vomiting, diarrhea, change in bowel habits, loss of appetite, bloody stools.   Resp: + shortness of breath with exertion or at rest.     No coughing up of blood.    Skin: no rash or lesions.  GU: no dysuria, change in color of urine, no urgency or frequency.  No flank pain, no hematuria          SUBJECTIVE:    VITAL SIGNS: Temp:  [97.9 F (36.6 C)-98.5 F (36.9 C)] 97.9 F (36.6 C) (09/07 1116) Pulse Rate:   [36-47] 44 (09/07 1415) Resp:  [15-23] 15 (09/07 1430) BP: (101-138)/(35-47) 124/41 mmHg (09/07 1430) SpO2:  [93 %-100 %] 100 % (09/07 1415) FiO2 (%):  [40 %] 40 % (09/07 1411) HEMODYNAMICS:   VENTILATOR SETTINGS: Vent Mode:  [-] BIPAP FiO2 (%):  [40 %] 40 % Set Rate:  [8 bmp] 8 bmp INTAKE / OUTPUT: No intake or output data in the 24 hours ending 05/06/14 1448  PHYSICAL EXAMINATION: General:  Frail elderly chronically ill appearing  Neuro:  Lethargic , follows simple commands  HEENT:  Dry mucosa/on BIPAP  Cardiovascular:  Bradycardia , 2/6 SM  Lungs:  Decreased BS in right , scant crackles  Abdomen:  Soft , hypoactive BS  Musculoskeletal:  Intact  Skin:  Intact   LABS:  CBC  Recent Labs Lab 05/06/14 0932  WBC 6.6  HGB 11.5*  HCT 37.3  PLT 169   Coag's No results found for this basename: APTT, INR,  in the last 168 hours BMET  Recent Labs Lab 05/06/14 0932  NA 140  K 3.4*  CL 99  CO2 26  BUN 17  CREATININE 3.15*  GLUCOSE 71   Electrolytes  Recent Labs Lab 05/06/14 0932  CALCIUM 8.7   Sepsis Markers No results found for this basename: LATICACIDVEN, PROCALCITON, O2SATVEN,  in the last 168 hours ABG  Recent Labs Lab 05/06/14 1347  PHART 7.208*  PCO2ART 69.4*  PO2ART 68.0*   Liver Enzymes No results found for this basename: AST, ALT, ALKPHOS, BILITOT, ALBUMIN,  in the last 168 hours Cardiac Enzymes No results found for this basename: TROPONINI, PROBNP,  in the last 168 hours Glucose  Recent Labs Lab 05/06/14 0925 05/06/14 1242 05/06/14 1316 05/06/14 1347  GLUCAP 103* 33* 98 161*    Imaging No results found.   ASSESSMENT / PLAN:  PULMONARY  A: Acute Hypercarbic Resp Failure  Right Mod Pleural Effusion -?chronic   P:   BIPAP support  Repeat ABG on BIPAP  Titrate O2 for sat >90%  CARDIOVASCULAR  A: Bradycardia  CAD /Aortic Stenosis -last echo EF 65%, mod/severe AS , PAP 52  Hypotension  Atrial Fib/flutter on Amio PTA    P:  Cards following  Hold BB , norvasc , cardizem , Amio for now  Plavix on hold for now , restart when able to take oral  Hep DVT   RENAL A:  ESRD on HD (M/W/F )  Hypokalemia   P:   Consult Renal  Repeat BMET  Replace electrolytes as indicated   GASTROINTESTINAL A:  NPO  P:   Restart diet when off BIPAP /improved   HEMATOLOGIC A:  Anemia of chronic illness  P:  Transfuse per protocol   INFECTIOUS A:  No fever or elevated WBC  P:    Abx:  start date , day   ENDOCRINE A:  DM  Hypoglycemia    P:   SSI  D5 1/2 NS at [place] A:  Altered mental status  -metabolic encephalopathy/hypercarbia  P:   Avoid oversedation   TODAY'S SUMMARY: 73 yo ESRD /HD admitted with mod/lg right pleural effusion and hypercarbic RF . Improved mentation on BIPAP. SHe is a DNR .   I have personally obtained a history, examined the patient, evaluated laboratory and imaging results, formulated the assessment and plan and placed orders. CRITICAL CARE: The patient is critically ill with multiple organ systems failure and requires high complexity decision making for assessment and support, frequent evaluation and titration of therapies, application of advanced monitoring technologies and extensive interpretation of multiple databases. Critical Care Time devoted to patient care services described in this note is minutes.   Tammy Parrett NP-C  Pulmonary and [place] Pager: [phone]   PCCM ATTENDING: I have interviewed and examined the patient and reviewed the database. I have formulated the assessment and plan as reflected in the note above with amendments made by me.   Merton Border,  MD;  PCCM service; [place] [phone]  05/06/2014, 2:48 PM

## 2014-05-06 NOTE — ED Notes (Signed)
Attempted report 

## 2014-05-06 NOTE — ED Notes (Addendum)
MD at bedside. CBG rechecked and 98. Pt remains lethargic and bradycardic. zoll at bedside.

## 2014-05-06 NOTE — ED Provider Notes (Signed)
73 year old female, end stage renal disease on dialysis presents from dialysis with complaint of possible low blood pressure, dizziness, patient is unable to explain exactly what she was feeling stating that she felt "foolish" at this time the patient states all of her symptoms have gone away and she is feeling back to normal. On exam the patient has a slight bradycardia, systolic murmur consistent with the patient's history of aortic stenosis, diffuse scattered rhonchi, the patient is in no respiratory distress but does appear sleepy. She has no peripheral edema, soft nontender abdomen and is able to answer all questions appropriately.  Chest x-ray, labs, cardiac monitoring.   EKG Interpretation  Date/Time:  Monday May 06 2014 09:39:55 EDT Ventricular Rate:  47 PR Interval:  206 QRS Duration: 171 QT Interval:  556 QTC Calculation: 492 R Axis:   166 Text Interpretation:  Sinus bradycardia RBBB and LPFB Repol abnrm suggests ischemia, diffuse leads Since last tracing rate slower Confirmed by Jaquail Mclees  MD, Ryiah Bellissimo ([id number]) on 05/06/2014 9:44:37 AM      During the patient's emergency department evaluation she developed worsening hypoglycemia and became very symptomatic being diaphoretic, shaky and had decreased level of consciousness. As she progressed she had a progressive bradycardia with a pulse of 35 though her blood pressure remained in a normal range. She had persistent reduced level of consciousness. When she awakened she follows commands with all 4 extremities. D50 bolus given x2, will admit to the hospital for bradycardia, hypoglycemia and ongoing decreased level of consciousness.  Labs otherwise unremarkable including troponin, blood counts and metabolic panel. Creatinine at baseline, x-ray shows pleural effusion similar to prior x-rays.  Discussed with hospitalist regarding admission, discussed with cardiology regarding consultation for bradycardia. Repeat EKG at this time shows sinus  bradycardia, pulse of 37, no signs of high-grade block, Q waves discerned, QRS complex and morphology has not changed nor has ST segments. At 1:45 PM, I drew A. ABG, this was sent to the lab, the patient is oxygenating at 97% on baseline nasal cannula, she is able to awaken and speak to voice though she still appears very somnolent. Blood pressure normal, discussed with Dr. Tenny Craw of cardiology who agrees that bradycardia itself unlikely source of somnolence as she has normal blood pressure.  220, discussed with Dr. Darrol Angel of critical care, he will see the patient for admission. The patient is now on BiPAP, this will hopefully help with metabolic acidosis and oxygenation. Atropine given 4 pulse with slight improvement up to 45.  4:00 PM   Patient reevaluated and found to be hypoglycemic again. Requiring D50 as well as a D5 normal saline drip. Clinically the patient still awakens and follows commands to voice. Vital signs show persistent pulse in the 40s and normal blood pressure  CRITICAL CARE Performed by: Vida Roller Total critical care time: 60 Critical care time was exclusive of separately billable procedures and treating other patients. Critical care was necessary to treat or prevent imminent or life-threatening deterioration. Critical care was time spent personally by me on the following activities: development of treatment plan with patient and/or surrogate as well as nursing, discussions with consultants, evaluation of patient's response to treatment, examination of patient, obtaining history from patient or surrogate, ordering and performing treatments and interventions, ordering and review of laboratory studies, ordering and review of radiographic studies, pulse oximetry and re-evaluation of patient's condition.    Medical screening examination/treatment/procedure(s) were conducted as a shared visit with non-physician practitioner(s) and myself.  I personally evaluated the patient  during  the encounter.  Clinical Impression:   Final diagnoses:  Metabolic acidosis  Acute encephalopathy  Hypoglycemia         Vida Roller, MD 05/06/14 1620

## 2014-05-06 NOTE — ED Notes (Signed)
hospitalist MD at bedside. 

## 2014-05-06 NOTE — Consult Note (Signed)
Cardiologist:  Claiborne Billings  Reason for Consult: Bradycardia Referring Physician:  Ardelia Mems is an 73 y.o. female.  HPI:   Ms Emmer is a 73 year old African American female with a history of end-stage renal disease, CAD, and aortic stenosis. She suffered a non-ST segment elevation myocardial infarction in February, 2011. At that time, she underwent CABG surgery x3 by Dr. Nils Pyle and a LIMA was placed to the LAD, a vein to the circumflex, and a vein to the PDA. She did have very mild aortic stenosis at time of catheterization with a mean gradient of 10 mm. During her surgery, an intraoperative TEE confirmed mild AS and was not felt that this was severe enough to warrant replacement at that time. In August 2012 her aortic valve gradient had increased to 41 mm mean, with a peak gradient of 70. In November, she developed episodes of chest pain during dialysis as well as atrial fibrillation. She was transported to [facility] from her dialysis center in [place] on 07/06/2013 and a cardiac catheterization revealed diffusely calcified severe disease involving the right artery with an occluded graft that supplied the PDA. The graft that supplied the obtuse marginal vessel was also occluded and the LIMA to the LAD was patent. She did have moderate aortic valve stenosis. She was turned down for reconsideration of repeat surgery. Consequently, 07/11/2013 Dr. Claiborne Billings performed complex HSRA of her severely calcified native RCA. This was very difficult but successful and ultimately tandem 3.0x38 mm DES stents were inserted into the native RCA with all sites being postdilated to 3.5 mm and with a site of residual extrinsic calcium being postdilated to 3.89 in the mid RCA.   In February of this year, she developed atrial flutter with a rapid ventricular response in the setting of her severe aortic stenosis. A repeat catheterization was done by Dr. Irish Lack on 10/06/2013. This revealed a patent LIMA to LAD. The  OM1 branch of the circumflex was occluded in its midportion. The SVG to the circumflex was occluded. SVG to the RCA was occluded. The native RCA, which had undergone extensive rotation arthrectomy and stenting revealed patent stents. Again, there was noted residual calcification and one stent still had a mild waist, which was this way despite being postdilated to 3.75 mm the time of the initial intervention.  In regards to her atrial flutter/fibrillation, she had been maintained on 200 mg amiodarone daily as well as 180 mg of diltiazem. She remains on aspirin and Plavix. She was seen in the clinic on 03/19/14, she stated at that time that regularly during hemodialysis, she is noted to have slow heart rates, as low as the 40s.  She reported feeling dizzy occasionally but denied syncope/near-syncope.  Her EKG in the clinic demonstrated sinus bradycardia with occasional PVCs. Her heart rate was 50 beats per minute.   The patient presented from HD hypotensive.  She reports her head felt "foolish", lightheaded and SOB.  No N, V, fever, CP, LEE, Abd pain.   Her HR has been as low as 35-37.  She is on Amio 114m daily and diltiazem 1811m  Her last echo was 10/06/13 with normal EF, mod to severe AS, mod LVH, peak PA pressure 5250m, mod to sev TR  Past Medical History  Diagnosis Date  . CHF (congestive heart failure)     diast SHF, RV failure, pulm HTN  . Diabetes mellitus   . ESRD on hemodialysis     HD MWF in [place]   .  Hyperlipidemia   . Anemia   . Hypertension   . COPD (chronic obstructive pulmonary disease)     severe with FEV1 0.64 L  . Peripheral vascular disease   . Coronary artery disease     Hx CABG. Cath 07/06/13 for CP episode showed occlusive graft disease and moderate AS; pt was not a candidate for repeat surgery and a complex PCI/rotablator/stenting procedure of calcified native RCA was done by cardiology  . PAF (paroxysmal atrial fibrillation)     recurrent  . Chronic right-sided heart  failure   . Aortic stenosis     Past Surgical History  Procedure Laterality Date  . Coronary artery bypass graft  10/20/2009    x3 by Dr Darcey Nora with LIMA to the LAD, a vein to the circumflex and a vien to the PDA.  Marland Kitchen Abdominal hysterectomy    . Thoracentesis Left 12/17/2009  . Av fistula placement  01-12-2008    left upper arm  . Cardiac catheterization  10/15/2009    which showed low normal LV function with mild inferior hypocontractility.  . Thoracentesis Right 10/27/2013    Family History  Problem Relation Age of Onset  . Heart disease Mother     Social History:  reports that she has quit smoking. Her smoking use included Cigarettes. She started smoking about 40 years ago. She smoked 0.50 packs per day. She has never used smokeless tobacco. She reports that she does not drink alcohol or use illicit drugs.  Allergies:  Allergies  Allergen Reactions  . Codeine Rash  . Sulfa Antibiotics Other (See Comments)    unknown    Medications: Prior to Admission medications   Medication Sig Start Date End Date Taking? Authorizing Provider  ACCU-CHEK AVIVA PLUS test strip  11/22/13  Yes Historical Provider, MD  acetaminophen (TYLENOL) 325 MG tablet Take 650 mg by mouth every 6 (six) hours as needed for moderate pain.   Yes Historical Provider, MD  amiodarone (PACERONE) 200 MG tablet Take 200 mg by mouth daily.  03/08/14  Yes Troy Sine, MD  aspirin 81 MG chewable tablet Chew 81 mg by mouth daily.    Yes Historical Provider, MD  ASSURE COMFORT LANCETS 30G [place]  11/22/13  Yes Historical Provider, MD  b complex-vitamin c-folic acid (NEPHRO-VITE) 0.8 MG TABS tablet Take 1 tablet by mouth at bedtime.   Yes Historical Provider, MD  bisacodyl (DULCOLAX) 5 MG EC tablet Take 10 mg by mouth 2 (two) times daily as needed for mild constipation or moderate constipation.   Yes Historical Provider, MD  Blood Glucose Monitoring Suppl (ACCU-CHEK AVIVA PLUS) W/DEVICE KIT  11/22/13  Yes Historical  Provider, MD  fexofenadine (ALLEGRA) 180 MG tablet Take 180 mg by mouth daily.   Yes Historical Provider, MD  insulin aspart (NOVOLOG) 100 UNIT/ML injection Inject 2-10 Units into the skin See admin instructions. Sliding scale. 151-200 use 2 units; 201-250 use 4 units; 251-300 use 6 units; 301-350 use 8 units; 351-400 use 10 units   Yes Historical Provider, MD         amLODipine (NORVASC) 2.5 MG tablet Take 1 tablet by mouth daily. 04/30/14   Historical Provider, MD  atorvastatin (LIPITOR) 40 MG tablet Take 40 mg by mouth daily.    Historical Provider, MD  clopidogrel (PLAVIX) 75 MG tablet Take 75 mg by mouth daily with breakfast.    Historical Provider, MD  diltiazem (CARDIZEM CD) 180 MG 24 hr capsule Take 1 capsule (180 mg total) by mouth daily.  12/12/13   Lucious Groves, DO  HYDROcodone-acetaminophen (NORCO/VICODIN) 5-325 MG per tablet Take 1 tablet by mouth every 8 (eight) hours as needed for moderate pain.    Historical Provider, MD  loratadine (CLARITIN) 10 MG tablet Take 10 mg by mouth daily.    Historical Provider, MD  nitroGLYCERIN (NITROSTAT) 0.4 MG SL tablet Place 0.4 mg under the tongue every 5 (five) minutes as needed for chest pain.    Historical Provider, MD     Results for orders placed during the hospital encounter of 05/06/14 (from the past 48 hour(s))  CBG MONITORING, ED     Status: Abnormal   Collection Time    05/06/14  9:25 AM      Result Value Ref Range   Glucose-Capillary 103 (*) 70 - 99 mg/dL   Comment 1 Documented in Chart     Comment 2 Notify RN    CBC WITH DIFFERENTIAL     Status: Abnormal   Collection Time    05/06/14  9:32 AM      Result Value Ref Range   WBC 6.6  4.0 - 10.5 K/uL   RBC 3.93  3.87 - 5.11 MIL/uL   Hemoglobin 11.5 (*) 12.0 - 15.0 g/dL   HCT 37.3  36.0 - 46.0 %   MCV 94.9  78.0 - 100.0 fL   MCH 29.3  26.0 - 34.0 pg   MCHC 30.8  30.0 - 36.0 g/dL   RDW 22.2 (*) 11.5 - 15.5 %   Platelets 169  150 - 400 K/uL   Neutrophils Relative % 67  43 - 77  %   Lymphocytes Relative 16  12 - 46 %   Monocytes Relative 13 (*) 3 - 12 %   Eosinophils Relative 3  0 - 5 %   Basophils Relative 1  0 - 1 %   Neutro Abs 4.3  1.7 - 7.7 K/uL   Lymphs Abs 1.1  0.7 - 4.0 K/uL   Monocytes Absolute 0.9  0.1 - 1.0 K/uL   Eosinophils Absolute 0.2  0.0 - 0.7 K/uL   Basophils Absolute 0.1  0.0 - 0.1 K/uL   RBC Morphology POLYCHROMASIA PRESENT     Comment: ELLIPTOCYTES  BASIC METABOLIC PANEL     Status: Abnormal   Collection Time    05/06/14  9:32 AM      Result Value Ref Range   Sodium 140  137 - 147 mEq/L   Potassium 3.4 (*) 3.7 - 5.3 mEq/L   Chloride 99  96 - 112 mEq/L   CO2 26  19 - 32 mEq/L   Glucose, Bld 71  70 - 99 mg/dL   BUN 17  6 - 23 mg/dL   Creatinine, Ser 3.15 (*) 0.50 - 1.10 mg/dL   Calcium 8.7  8.4 - 10.5 mg/dL   GFR calc non Af Amer 14 (*) >90 mL/min   GFR calc Af Amer 16 (*) >90 mL/min   Comment: (NOTE)     The eGFR has been calculated using the CKD EPI equation.     This calculation has not been validated in all clinical situations.     eGFR's persistently <90 mL/min signify possible Chronic Kidney     Disease.   Anion gap 15  5 - 15  CBG MONITORING, ED     Status: Abnormal   Collection Time    05/06/14 12:42 PM      Result Value Ref Range   Glucose-Capillary 33 (*) 70 - 99  mg/dL  Randolm Idol, ED     Status: None   Collection Time    05/06/14  1:06 PM      Result Value Ref Range   Troponin i, poc 0.00  0.00 - 0.08 ng/mL   Comment 3            Comment: Due to the release kinetics of cTnI,     a negative result within the first hours     of the onset of symptoms does not rule out     myocardial infarction with certainty.     If myocardial infarction is still suspected,     repeat the test at appropriate intervals.  CBG MONITORING, ED     Status: None   Collection Time    05/06/14  1:16 PM      Result Value Ref Range   Glucose-Capillary 98  70 - 99 mg/dL  I-STAT ARTERIAL BLOOD GAS, ED     Status: Abnormal    Collection Time    05/06/14  1:47 PM      Result Value Ref Range   pH, Arterial 7.208 (*) 7.350 - 7.450   pCO2 arterial 69.4 (*) 35.0 - 45.0 mmHg   pO2, Arterial 68.0 (*) 80.0 - 100.0 mmHg   Bicarbonate 27.7 (*) 20.0 - 24.0 mEq/L   TCO2 30  0 - 100 mmol/L   O2 Saturation 88.0     Acid-base deficit 2.0  0.0 - 2.0 mmol/L   Patient temperature 97.9 F     Sample type ARTERIAL     Comment NOTIFIED PHYSICIAN    CBG MONITORING, ED     Status: Abnormal   Collection Time    05/06/14  1:47 PM      Result Value Ref Range   Glucose-Capillary 161 (*) 70 - 99 mg/dL    Dg Chest 2 View  05/06/2014   CLINICAL DATA:  Dizziness during dialysis.  EXAM: CHEST  2 VIEW  COMPARISON:  01/11/2014  FINDINGS: Moderate to large right pleural effusion. Enlarged cardiopericardial silhouette. Prior CABG. Cephalization of blood flow with bilateral interstitial edema.  Coronary artery stents are also noted.  IMPRESSION: 1. Cardiomegaly with interstitial edema. 2. Large right pleural effusion, similar magnitude to prior, with passive atelectasis.   Electronically Signed   By: Sherryl Barters M.D.   On: 05/06/2014 10:44    Review of Systems  Constitutional: Negative for fever.  Respiratory: Positive for shortness of breath.   Cardiovascular: Negative for chest pain and leg swelling.  Gastrointestinal: Negative for nausea, vomiting and abdominal pain.  Musculoskeletal: Negative for myalgias.  Neurological: Positive for dizziness and weakness.   Blood pressure 124/41, pulse 44, temperature 97.9 F (36.6 C), temperature source Oral, resp. rate 15, SpO2 100.00%. Physical Exam  Nursing note and vitals reviewed. Constitutional: She appears well-developed and well-nourished. No distress.  HENT:  Head: Normocephalic and atraumatic.  Eyes: Conjunctivae are normal. Pupils are equal, round, and reactive to light. No scleral icterus.  Neck: Normal range of motion. Neck supple.  Cardiovascular: Regular rhythm, S1 normal and  S2 normal.  Bradycardia present.   Murmur heard.  Systolic murmur is present with a grade of 2/6  Pulses:      Radial pulses are 2+ on the right side, and 2+ on the left side.       Posterior tibial pulses are 1+ on the right side, and 2+ on the left side.  Respiratory: Effort normal. She has no wheezes.  Left side  rhonchi   GI: Soft. Bowel sounds are normal. She exhibits no distension. There is no tenderness.  Musculoskeletal: She exhibits no edema.  Neurological: She is alert.  Follows commands.    Skin: Skin is warm and dry.  Psychiatric:  Somnolent    EKG  SB 37 bpm  First degree AV block  RBBB.  LPFB.   Assessment/Plan: Active Problems:   Aortic stenosis, severe   PAF (paroxysmal atrial fibrillation)   COPD (chronic obstructive pulmonary disease)   CAD- CABG X 3 09/2009, HSRA/DES to native RCA 07/11/13   Sinus bradycardia   ESRD (end stage renal disease)  Plan:  Recommend holding diltiazem for now.  Continue amio 120m daily which is what it was decreased to back in July.  It has a very long half-life anyway so stopping it would not make much difference.  Continue to monitor HR.   Watch for Afib.  She was not on Toprol in July when seen by Ms. Simmons in the clinic, however, it is listed as a PTA med.   HTarri Fuller PMonroe9/02/2014, 2:43 PM   Patinet seen and examined.  Agree with hx as noted by B Hager above.  Patient now appears somnolent but answers questions.  HR 30s to 40s  (SB)  BP 110s to 120s  EKG with conduction disease  P waves difficult to see; PR interval appears longer.    On BiPAP  She denies SOB or dizziness now.    1.  Bradycardia:  Agree with stopping diltiazem as noted above.  She should not be on toprol  I would continue low dose amiodarone 100 mg Afib  Follow HR response  Continue telemetry  2.  MS  Low HR does not explain somnolence.  Note Abnormal ABG appears venous  Follow   AS   Moderately severe on echo in Feb.  Mean gradient at 40 suggests  severe.  Sclerosis in this area prob relates to conductio disease.  4.  CAD  No symptoms of active angina  Severe disease.  5.  ESRD  On dialysis.    PDorris Carnes

## 2014-05-06 NOTE — Progress Notes (Signed)
Transported to 2S on NIV through the Servo i ventilator.  Transported on 100% O2 NIV 12/6.  Patient tolerated well.

## 2014-05-06 NOTE — ED Notes (Signed)
IV team will come remove dialysis needles

## 2014-05-06 NOTE — ED Notes (Signed)
IV team at bedside 

## 2014-05-06 NOTE — ED Notes (Signed)
PA at bedside.

## 2014-05-06 NOTE — ED Notes (Signed)
Pt tolerating Bipap well  

## 2014-05-06 NOTE — ED Notes (Signed)
CBG rechecked due to decreased LOC and diaphoretic. Pt noted to have CBG of 33. MD made aware. D50 given

## 2014-05-06 NOTE — ED Notes (Signed)
CCM at bedside 

## 2014-05-06 NOTE — ED Notes (Signed)
Respiratory called for bipap.

## 2014-05-06 NOTE — Consult Note (Signed)
Renal Service Consult Note [facility]  [name] 05/06/2014 Roney Jaffe D Requesting Physician:  Dr Alva Garnet  Reason for Consult:  ESRD pt with hypotension and AMS HPI: The patient is a 73 y.o. year-old with hx of DM, ESRD on HD, R HF w pulm HTN, anemia, HTN, severe COPD, CAD hx CABG w graft disease not surgical candidate.  Sent to outside ED from HD this am for low BP's and dec'd MS.  ABG showed CO2 retention and bipap was placed. Pt is DNR. Did not get much HD today. CXR shows CM with diffuse IS edema and R effusion.    Chart review: 11/14 - COPD, hx cabg 2011, ESRD on HD > CP and rapid afib at HD, +trop 10. Rx hep, nitrates, converted to NSR. Heart cath showed graft disease, open LIMA to LAD, pulm HTN, EF 50 % mod AS. Seen by surgery not a candidate for surgical intervention. Had complex PCI procedure to native RCA with HSRA.   02/15 - chest pain, NSTEMI > again not surg candidate, medical Rx, amio + plavix and ASA 02/15 - afib, AS w medical Rx, pleural effusion, hx RV failure an dpulm HTN, CP w known CAD, medical Rx 03/15 - cardiac arrest at OP HD > ROSC after about 10 min.  Seen by TAVR team and not felt to be a candidate for the procedure.  Required pressors, didn't tolerate BB due to low BP.  Limited code established. Cdif colitis, ESRD on HD, DM, HL, COPD, AS, PAF, known CAD as above, R HF 03/15 - to ED for SVT at HD. Pall care consult. NO intubation or CPR, ok to shock or give meds.  04/15 - passed out at HD > ^trop , no EKG changes. NSTEMI.  SVT rx with diltiazem.  Syncope due to AS and vol shifts with HD.     ROS  n/a  Past Medical History  Past Medical History  Diagnosis Date  . CHF (congestive heart failure)     diast SHF, RV failure, pulm HTN  . Diabetes mellitus   . ESRD on hemodialysis     HD MWF in [place]   . Hyperlipidemia   . Anemia   . Hypertension   . COPD (chronic obstructive pulmonary disease)     severe with FEV1 0.64 L  . Peripheral  vascular disease   . Coronary artery disease     Hx CABG. Cath 07/06/13 for CP episode showed occlusive graft disease and moderate AS; pt was not a candidate for repeat surgery and a complex PCI/rotablator/stenting procedure of calcified native RCA was done by cardiology  . PAF (paroxysmal atrial fibrillation)     recurrent  . Chronic right-sided heart failure   . Aortic stenosis    Past Surgical History  Past Surgical History  Procedure Laterality Date  . Coronary artery bypass graft  10/20/2009    x3 by Dr Darcey Nora with LIMA to the LAD, a vein to the circumflex and a vien to the PDA.  Marland Kitchen Abdominal hysterectomy    . Thoracentesis Left 12/17/2009  . Av fistula placement  01-12-2008    left upper arm  . Cardiac catheterization  10/15/2009    which showed low normal LV function with mild inferior hypocontractility.  . Thoracentesis Right 10/27/2013   Family History  Family History  Problem Relation Age of Onset  . Heart disease Mother    Social History  reports that she has quit smoking. Her smoking use included Cigarettes. She  started smoking about 40 years ago. She smoked 0.50 packs per day. She has never used smokeless tobacco. She reports that she does not drink alcohol or use illicit drugs. Allergies  Allergies  Allergen Reactions  . Codeine Rash  . Sulfa Antibiotics Other (See Comments)    unknown   Home medications Prior to Admission medications   Medication Sig Start Date End Date Taking? Authorizing Provider  ACCU-CHEK AVIVA PLUS test strip  11/22/13  Yes Historical Provider, MD  acetaminophen (TYLENOL) 325 MG tablet Take 650 mg by mouth every 6 (six) hours as needed for moderate pain.   Yes Historical Provider, MD  amiodarone (PACERONE) 200 MG tablet Take 200 mg by mouth daily.  03/08/14  Yes Troy Sine, MD  aspirin 81 MG chewable tablet Chew 81 mg by mouth daily.    Yes Historical Provider, MD  ASSURE COMFORT LANCETS 30G [place]  11/22/13  Yes Historical Provider, MD  b  complex-vitamin c-folic acid (NEPHRO-VITE) 0.8 MG TABS tablet Take 1 tablet by mouth at bedtime.   Yes Historical Provider, MD  bisacodyl (DULCOLAX) 5 MG EC tablet Take 10 mg by mouth 2 (two) times daily as needed for mild constipation or moderate constipation.   Yes Historical Provider, MD  Blood Glucose Monitoring Suppl (ACCU-CHEK AVIVA PLUS) W/DEVICE KIT  11/22/13  Yes Historical Provider, MD  fexofenadine (ALLEGRA) 180 MG tablet Take 180 mg by mouth daily.   Yes Historical Provider, MD  insulin aspart (NOVOLOG) 100 UNIT/ML injection Inject 2-10 Units into the skin See admin instructions. Sliding scale. 151-200 use 2 units; 201-250 use 4 units; 251-300 use 6 units; 301-350 use 8 units; 351-400 use 10 units   Yes Historical Provider, MD  metoprolol succinate (TOPROL-XL) 50 MG 24 hr tablet Take 1 tablet by mouth daily. 02/21/14  Yes Historical Provider, MD  amLODipine (NORVASC) 2.5 MG tablet Take 1 tablet by mouth daily. 04/30/14   Historical Provider, MD  atorvastatin (LIPITOR) 40 MG tablet Take 40 mg by mouth daily.    Historical Provider, MD  clopidogrel (PLAVIX) 75 MG tablet Take 75 mg by mouth daily with breakfast.    Historical Provider, MD  diltiazem (CARDIZEM CD) 180 MG 24 hr capsule Take 1 capsule (180 mg total) by mouth daily. 12/12/13   Lucious Groves, DO  HYDROcodone-acetaminophen (NORCO/VICODIN) 5-325 MG per tablet Take 1 tablet by mouth every 8 (eight) hours as needed for moderate pain.    Historical Provider, MD  loratadine (CLARITIN) 10 MG tablet Take 10 mg by mouth daily.    Historical Provider, MD  nitroGLYCERIN (NITROSTAT) 0.4 MG SL tablet Place 0.4 mg under the tongue every 5 (five) minutes as needed for chest pain.    Historical Provider, MD   Liver Function Tests No results found for this basename: AST, ALT, ALKPHOS, BILITOT, PROT, ALBUMIN,  in the last 168 hours No results found for this basename: LIPASE, AMYLASE,  in the last 168 hours CBC  Recent Labs Lab 05/06/14 0932  05/06/14 1627  WBC 6.6 5.7  NEUTROABS 4.3  --   HGB 11.5* 10.9*  HCT 37.3 35.1*  MCV 94.9 93.1  PLT 169 272   Basic Metabolic Panel  Recent Labs Lab 05/06/14 0932  NA 140  K 3.4*  CL 99  CO2 26  GLUCOSE 71  BUN 17  CREATININE 3.15*  CALCIUM 8.7    Filed Vitals:   05/06/14 1430 05/06/14 1517 05/06/14 1545 05/06/14 1700  BP: 124/41 112/40 118/49   Pulse:  43   Temp:      TempSrc:  Oral    Resp: _0 Weight:    58.4 kg (128 lb 12 oz)  SpO2:  100% 98%    Exam Lethargic, on bipap, follows some commands, No rash, cyanosis or gangrene Sclera anicteric, throat clear +JVD Chest bibasilar rales RRR 3/6 blowing SEM Abd soft, no ascites +LE edema pitting 2+ Neuro is somnolent  HD: [facility] MWF Last admit April 2015 no heparin, 57kg, 4.5 hours, LUA AVF   Assessment: 1 AMS 2 Acute resp failure  3 Vol excess / pulm edema 4 Severe COPD 5 Mod severe AS, not surg candidate 6 CAD hx CABG, medical Rx 7 DNR 8 ESRD on HD 9 Bradycardia   Plan- poor prognosis.  BP ok, needs volume off.  Plan HD tonight in the ICU.  She may not tolerate it, she is at high risk for complications during HD. Not a CRRT candidate.  Will follow.   Kelly Splinter MD (pgr) [phone]    [phone] 05/06/2014, 5:14 PM

## 2014-05-06 NOTE — Progress Notes (Signed)
Order received for repeat ABG.  Attempted to stick once on right radial, unsuccessful.  Large hematoma on right brachial site.  Juliann Mule, NP notified.

## 2014-05-06 NOTE — ED Notes (Signed)
MD at bedside drawing ABG

## 2014-05-06 NOTE — ED Provider Notes (Signed)
CSN: [id number]     Arrival date & time 05/06/14  0907 History   First MD Initiated Contact with Patient 05/06/14 [phone]     Chief Complaint  Patient presents with  . Dizziness     (Consider location/radiation/quality/duration/timing/severity/associated sxs/prior Treatment) HPI [name] is a 73 y.o. female history of paroxysmal A. fib, CHF, COPD, ESRD who comes in for evaluation of dizziness. Patient was at dialysis this morning when at about 45 minutes patient states she became dizzy and "lightheaded". She has experienced hypotension periodically in the past while in dialysis. She reports being completely asymptomatic at this time. She denies feeling dizzy, short of breath, no chest pain, no numbness or tingling.  Past Medical History  Diagnosis Date  . CHF (congestive heart failure)     diast SHF, RV failure, pulm HTN  . Diabetes mellitus   . ESRD on hemodialysis     HD MWF in [place]   . Hyperlipidemia   . Anemia   . Hypertension   . COPD (chronic obstructive pulmonary disease)     severe with FEV1 0.64 L  . Peripheral vascular disease   . Coronary artery disease     Hx CABG. Cath 07/06/13 for CP episode showed occlusive graft disease and moderate AS; pt was not a candidate for repeat surgery and a complex PCI/rotablator/stenting procedure of calcified native RCA was done by cardiology  . PAF (paroxysmal atrial fibrillation)     recurrent  . Chronic right-sided heart failure   . Aortic stenosis    Past Surgical History  Procedure Laterality Date  . Coronary artery bypass graft  10/20/2009    x3 by Dr Darcey Nora with LIMA to the LAD, a vein to the circumflex and a vien to the PDA.  Marland Kitchen Abdominal hysterectomy    . Thoracentesis Left 12/17/2009  . Av fistula placement  01-12-2008    left upper arm  . Cardiac catheterization  10/15/2009    which showed low normal LV function with mild inferior hypocontractility.  . Thoracentesis Right 10/27/2013   Family History  Problem  Relation Age of Onset  . Heart disease Mother    History  Substance Use Topics  . Smoking status: Former Smoker -- 0.50 packs/day    Types: Cigarettes    Start date: 06/19/1973  . Smokeless tobacco: Never Used  . Alcohol Use: No   OB History   Grav Para Term Preterm Abortions TAB SAB Ect Mult Living                 Review of Systems  Constitutional: Negative for fever.  HENT: Negative for sore throat.   Eyes: Negative for visual disturbance.  Respiratory: Negative for shortness of breath.   Cardiovascular: Negative for chest pain.  Gastrointestinal: Negative for abdominal pain.  Endocrine: Negative for polyuria.  Genitourinary: Negative for dysuria.  Skin: Negative for rash.  Neurological: Positive for dizziness. Negative for headaches.      Allergies  Codeine and Sulfa antibiotics  Home Medications   Prior to Admission medications   Medication Sig Start Date End Date Taking? Authorizing Provider  ACCU-CHEK AVIVA PLUS test strip  11/22/13  Yes Historical Provider, MD  acetaminophen (TYLENOL) 325 MG tablet Take 650 mg by mouth every 6 (six) hours as needed for moderate pain.   Yes Historical Provider, MD  amiodarone (PACERONE) 200 MG tablet Take 200 mg by mouth daily.  03/08/14  Yes Troy Sine, MD  aspirin 81 MG chewable tablet Chew 81  mg by mouth daily.    Yes Historical Provider, MD  ASSURE COMFORT LANCETS 30G [place]  11/22/13  Yes Historical Provider, MD  b complex-vitamin c-folic acid (NEPHRO-VITE) 0.8 MG TABS tablet Take 1 tablet by mouth at bedtime.   Yes Historical Provider, MD  bisacodyl (DULCOLAX) 5 MG EC tablet Take 10 mg by mouth 2 (two) times daily as needed for mild constipation or moderate constipation.   Yes Historical Provider, MD  Blood Glucose Monitoring Suppl (ACCU-CHEK AVIVA PLUS) W/DEVICE KIT  11/22/13  Yes Historical Provider, MD  fexofenadine (ALLEGRA) 180 MG tablet Take 180 mg by mouth daily.   Yes Historical Provider, MD  insulin aspart (NOVOLOG)  100 UNIT/ML injection Inject 2-10 Units into the skin See admin instructions. Sliding scale. 151-200 use 2 units; 201-250 use 4 units; 251-300 use 6 units; 301-350 use 8 units; 351-400 use 10 units   Yes Historical Provider, MD  metoprolol succinate (TOPROL-XL) 50 MG 24 hr tablet Take 1 tablet by mouth daily. 02/21/14  Yes Historical Provider, MD  amLODipine (NORVASC) 2.5 MG tablet Take 1 tablet by mouth daily. 04/30/14   Historical Provider, MD  atorvastatin (LIPITOR) 40 MG tablet Take 40 mg by mouth daily.    Historical Provider, MD  clopidogrel (PLAVIX) 75 MG tablet Take 75 mg by mouth daily with breakfast.    Historical Provider, MD  diltiazem (CARDIZEM CD) 180 MG 24 hr capsule Take 1 capsule (180 mg total) by mouth daily. 12/12/13   Lucious Groves, DO  HYDROcodone-acetaminophen (NORCO/VICODIN) 5-325 MG per tablet Take 1 tablet by mouth every 8 (eight) hours as needed for moderate pain.    Historical Provider, MD  loratadine (CLARITIN) 10 MG tablet Take 10 mg by mouth daily.    Historical Provider, MD  nitroGLYCERIN (NITROSTAT) 0.4 MG SL tablet Place 0.4 mg under the tongue every 5 (five) minutes as needed for chest pain.    Historical Provider, MD   BP 124/41  Pulse 44  Temp(Src) 97.9 F (36.6 C) (Oral)  Resp 15  SpO2 100% Physical Exam  Nursing note and vitals reviewed. Constitutional: She is oriented to person, place, and time. She appears well-developed and well-nourished.  HENT:  Head: Normocephalic and atraumatic.  Mouth/Throat: Oropharynx is clear and moist.  Eyes: Conjunctivae are normal. Pupils are equal, round, and reactive to light. Right eye exhibits no discharge. Left eye exhibits no discharge. No scleral icterus.  Neck: Neck supple.  Cardiovascular: Normal rate and regular rhythm.   Systolic murmur grade 3/6, bradycardic to 40s  Pulmonary/Chest: Effort normal.  Adventitious lung sounds diffusely through all fields.  Abdominal: Soft. There is no tenderness.   Musculoskeletal: She exhibits no tenderness.  Neurological: She is alert and oriented to person, place, and time.  Cranial Nerves II-XII grossly intact. She appears tired but is easily aroused and answers all questions appropriately  Skin: Skin is warm and dry. No rash noted.  Psychiatric: She has a normal mood and affect.    ED Course  Procedures (including critical care time) Labs Review Labs Reviewed  CBC WITH DIFFERENTIAL - Abnormal; Notable for the following:    Hemoglobin 11.5 (*)    RDW 22.2 (*)    Monocytes Relative 13 (*)    All other components within normal limits  BASIC METABOLIC PANEL - Abnormal; Notable for the following:    Potassium 3.4 (*)    Creatinine, Ser 3.15 (*)    GFR calc non Af Amer 14 (*)    GFR calc  Af Amer 16 (*)    All other components within normal limits  CBG MONITORING, ED - Abnormal; Notable for the following:    Glucose-Capillary 103 (*)    All other components within normal limits  CBG MONITORING, ED - Abnormal; Notable for the following:    Glucose-Capillary 33 (*)    All other components within normal limits  I-STAT ARTERIAL BLOOD GAS, ED - Abnormal; Notable for the following:    pH, Arterial 7.208 (*)    pCO2 arterial 69.4 (*)    pO2, Arterial 68.0 (*)    Bicarbonate 27.7 (*)    All other components within normal limits  CBG MONITORING, ED - Abnormal; Notable for the following:    Glucose-Capillary 161 (*)    All other components within normal limits  URINALYSIS, ROUTINE W REFLEX MICROSCOPIC  BLOOD GAS, ARTERIAL  I-STAT TROPOININ, ED  CBG MONITORING, ED    Imaging Review Dg Chest 2 View  05/06/2014   CLINICAL DATA:  Dizziness during dialysis.  EXAM: CHEST  2 VIEW  COMPARISON:  01/11/2014  FINDINGS: Moderate to large right pleural effusion. Enlarged cardiopericardial silhouette. Prior CABG. Cephalization of blood flow with bilateral interstitial edema.  Coronary artery stents are also noted.  IMPRESSION: 1. Cardiomegaly with  interstitial edema. 2. Large right pleural effusion, similar magnitude to prior, with passive atelectasis.   Electronically Signed   By: Sherryl Barters M.D.   On: 05/06/2014 10:44     EKG Interpretation   Date/Time:  Monday May 06 2014 12:11:29 EDT Ventricular Rate:  41 PR Interval:  76 QRS Duration: 178 QT Interval:  631 QTC Calculation: 521 R Axis:   -158 Text Interpretation:  Sinus bradycardia Short PR interval RBBB and LPFB  Abnormal T, consider ischemia, lateral leads Since last tracing rate  slower Confirmed by MILLER  MD, BRIAN ([id number]) on 05/06/2014 1:19:40 PM     Meds given in ED:  Medications  sodium chloride 0.9 % bolus 500 mL (0 mLs Intravenous Stopped 05/06/14 1333)  dextrose 50 % solution 50 mL (50 mLs Intravenous Given 05/06/14 1250)  dextrose 50 % solution 50 mL (50 mLs Intravenous Given 05/06/14 1323)  atropine injection 1 mg (1 mg Intravenous Given 05/06/14 1353)    New Prescriptions   No medications on file    Filed Vitals:   05/06/14 1400 05/06/14 1411 05/06/14 1415 05/06/14 1430  BP: 114/40 114/40 107/41 124/41  Pulse: 43 44 44   Temp:      TempSrc:      Resp: 18 16 19 15   SpO2: 100% 100% 100%     MDM  Vitals stable - WNL -afebrile Pt reports being asymptomatic in ED, but she does report feeling increasingly tired. Chest x-ray unchanged since June 2015. EKG with unchanged and stable morphologies.  However, patient remains lethargic, diaphoretic and bradycardic in the 40s.  Found to be hypoglycemic, given 2 units of D50,  Care transferred to Dr. Noemi Chapel for admission to hospital. Final diagnoses:  Metabolic acidosis  Acute encephalopathy  Hypoglycemia  Prior to patient discharge, I discussed and reviewed this case with Dr.Brian Acquanetta Sit, PA-C 05/09/14 1826

## 2014-05-06 NOTE — ED Notes (Signed)
Pt remains lethargic and disoriented to time. States that she stays with a family friend. MD aware.

## 2014-05-06 NOTE — ED Notes (Signed)
  CBG 103  

## 2014-05-06 NOTE — ED Notes (Signed)
IV team paged to de access pts graft

## 2014-05-06 NOTE — ED Notes (Signed)
MD at bedside. 

## 2014-05-07 ENCOUNTER — Inpatient Hospital Stay (HOSPITAL_COMMUNITY): Payer: Medicare Other

## 2014-05-07 DIAGNOSIS — I509 Heart failure, unspecified: Secondary | ICD-10-CM

## 2014-05-07 DIAGNOSIS — J449 Chronic obstructive pulmonary disease, unspecified: Secondary | ICD-10-CM

## 2014-05-07 DIAGNOSIS — I359 Nonrheumatic aortic valve disorder, unspecified: Secondary | ICD-10-CM

## 2014-05-07 LAB — BLOOD GAS, ARTERIAL
Acid-Base Excess: 5.1 mmol/L — ABNORMAL HIGH (ref 0.0–2.0)
Bicarbonate: 30.5 mEq/L — ABNORMAL HIGH (ref 20.0–24.0)
DELIVERY SYSTEMS: POSITIVE
Drawn by: 10006
Expiratory PAP: 6
FIO2: 0.4 %
Inspiratory PAP: 12
O2 Saturation: 96.1 %
PCO2 ART: 57.1 mmHg — AB (ref 35.0–45.0)
Patient temperature: 98.6
TCO2: 32.2 mmol/L (ref 0–100)
pH, Arterial: 7.347 — ABNORMAL LOW (ref 7.350–7.450)
pO2, Arterial: 82.7 mmHg (ref 80.0–100.0)

## 2014-05-07 LAB — BASIC METABOLIC PANEL
ANION GAP: 12 (ref 5–15)
ANION GAP: 15 (ref 5–15)
BUN: 6 mg/dL (ref 6–23)
BUN: 8 mg/dL (ref 6–23)
CHLORIDE: 99 meq/L (ref 96–112)
CO2: 27 mEq/L (ref 19–32)
CO2: 21 meq/L (ref 19–32)
CREATININE: 1.63 mg/dL — AB (ref 0.50–1.10)
CREATININE: 2.15 mg/dL — AB (ref 0.50–1.10)
Calcium: 8.1 mg/dL — ABNORMAL LOW (ref 8.4–10.5)
Calcium: 8.2 mg/dL — ABNORMAL LOW (ref 8.4–10.5)
Chloride: 101 mEq/L (ref 96–112)
GFR calc Af Amer: 25 mL/min — ABNORMAL LOW (ref >90)
GFR calc non Af Amer: 22 mL/min — ABNORMAL LOW (ref >90)
GFR, EST AFRICAN AMERICAN: 36 mL/min — AB (ref >90)
GFR, EST NON AFRICAN AMERICAN: 31 mL/min — AB (ref >90)
Glucose, Bld: 32 mg/dL — CL (ref 70–99)
Glucose, Bld: 191 mg/dL — ABNORMAL HIGH (ref 70–99)
POTASSIUM: 6.1 meq/L — AB (ref 3.7–5.3)
Potassium: 5.6 mEq/L — ABNORMAL HIGH (ref 3.7–5.3)
Sodium: 140 mEq/L (ref 137–147)
Sodium: 135 mEq/L — ABNORMAL LOW (ref 137–147)

## 2014-05-07 LAB — CBC
HEMATOCRIT: 39.4 % (ref 36.0–46.0)
HEMOGLOBIN: 12.3 g/dL (ref 12.0–15.0)
MCH: 29.5 pg (ref 26.0–34.0)
MCHC: 31.2 g/dL (ref 30.0–36.0)
MCV: 94.5 fL (ref 78.0–100.0)
Platelets: 138 10*3/uL — ABNORMAL LOW (ref 150–400)
RBC: 4.17 MIL/uL (ref 3.87–5.11)
RDW: 22.7 % — ABNORMAL HIGH (ref 11.5–15.5)
WBC: 5.9 10*3/uL (ref 4.0–10.5)

## 2014-05-07 LAB — GLUCOSE, CAPILLARY
GLUCOSE-CAPILLARY: 191 mg/dL — AB (ref 70–99)
GLUCOSE-CAPILLARY: 345 mg/dL — AB (ref 70–99)
GLUCOSE-CAPILLARY: 70 mg/dL (ref 70–99)
Glucose-Capillary: 147 mg/dL — ABNORMAL HIGH (ref 70–99)
Glucose-Capillary: 93 mg/dL (ref 70–99)
Glucose-Capillary: 76 mg/dL (ref 70–99)

## 2014-05-07 LAB — HEMOGLOBIN A1C
Hgb A1c MFr Bld: 8.2 % — ABNORMAL HIGH (ref <5.7)
MEAN PLASMA GLUCOSE: 189 mg/dL — AB (ref <117)

## 2014-05-07 MED ORDER — IPRATROPIUM-ALBUTEROL 0.5-2.5 (3) MG/3ML IN SOLN
3.0000 mL | Freq: Four times a day (QID) | RESPIRATORY_TRACT | Status: DC
  Administered 2014-05-07 (×2): 3 mL via RESPIRATORY_TRACT
  Filled 2014-05-07 (×4): qty 3

## 2014-05-07 MED ORDER — PENTAFLUOROPROP-TETRAFLUOROETH EX AERO: 1.0000 "application " | INHALATION_SPRAY | CUTANEOUS | Status: DC | PRN

## 2014-05-07 MED ORDER — SODIUM POLYSTYRENE SULFONATE 15 GM/60ML PO SUSP
30.0000 g | Freq: Once | ORAL | Status: AC
  Administered 2014-05-07: 30 g via ORAL
  Filled 2014-05-07: qty 120

## 2014-05-07 MED ORDER — DEXTROSE 50 % IV SOLN
INTRAVENOUS | Status: AC
  Administered 2014-05-07: 50 mL
  Filled 2014-05-07: qty 50

## 2014-05-07 MED ORDER — LIDOCAINE-PRILOCAINE 2.5-2.5 % EX CREA: 1.0000 "application " | TOPICAL_CREAM | CUTANEOUS | Status: DC | PRN

## 2014-05-07 MED ORDER — CLOPIDOGREL BISULFATE 75 MG PO TABS
75.0000 mg | ORAL_TABLET | Freq: Every day | ORAL | Status: DC
  Administered 2014-05-07 – 2014-05-09 (×3): 75 mg via ORAL
  Filled 2014-05-07 (×3): qty 1

## 2014-05-07 MED ORDER — NEPRO/CARBSTEADY PO LIQD
237.0000 mL | ORAL | Status: DC | PRN
Start: 2014-05-07 — End: 2014-05-08

## 2014-05-07 MED ORDER — DEXTROSE 5 % IV SOLN
INTRAVENOUS | Status: DC
  Administered 2014-05-07: 12:00:00 via INTRAVENOUS

## 2014-05-07 MED ORDER — CETYLPYRIDINIUM CHLORIDE 0.05 % MT LIQD
7.0000 mL | Freq: Two times a day (BID) | OROMUCOSAL | Status: DC
  Administered 2014-05-07 – 2014-05-09 (×3): 7 mL via OROMUCOSAL

## 2014-05-07 MED ORDER — ALTEPLASE 2 MG IJ SOLR
2.0000 mg | Freq: Once | INTRAMUSCULAR | Status: AC | PRN
  Filled 2014-05-07: qty 2

## 2014-05-07 MED ORDER — SODIUM CHLORIDE 0.9 % IV SOLN: 100.0000 mL | INTRAVENOUS | Status: DC | PRN

## 2014-05-07 MED ORDER — HEPARIN SODIUM (PORCINE) 1000 UNIT/ML DIALYSIS
1000.0000 [IU] | INTRAMUSCULAR | Status: DC | PRN
  Filled 2014-05-07: qty 1

## 2014-05-07 MED ORDER — DEXTROSE 50 % IV SOLN: 50.0000 mL | Freq: Once | INTRAVENOUS | Status: AC | PRN

## 2014-05-07 MED ORDER — LIDOCAINE HCL (PF) 1 % IJ SOLN: 5.0000 mL | INTRAMUSCULAR | Status: DC | PRN

## 2014-05-07 NOTE — Progress Notes (Signed)
PULMONARY / CRITICAL CARE MEDICINE   Name: [name] MRN: [id number] DOB: February 24, 1941    ADMISSION DATE:  05/06/2014  REFERRING MD :  EDP   CHIEF COMPLAINT:  Resp Distress   INITIAL PRESENTATION:  73yo ESRD /HD bradycardic Vesta Mixer w/ dizziness during HD transferred to ER .  Found to be hypercarbic placed on BIPAP . PCCM asked to admit.   STUDIES:    SIGNIFICANT EVENTS: 9/7 HD, 2.5 L removed 9/8 Off BIPAP, comfortable, wants to eat and get out of bed   SUBJECTIVE:  Off BIPAP, comfortable, wants to eat and get out of bed  VITAL SIGNS: Temp:  [97.8 F (36.6 C)-98.5 F (36.9 C)] 97.8 F (36.6 C) (09/08 0400) Pulse Rate:  [36-50] 48 (09/08 0700) Resp:  [12-27] 17 (09/08 0700) BP: (81-140)/(33-88) 103/38 mmHg (09/08 0700) SpO2:  [92 %-100 %] 93 % (09/08 0700) FiO2 (%):  [40 %] 40 % (09/08 0600) Weight:  [54.7 kg (120 lb 9.5 oz)-58.4 kg (128 lb 12 oz)] 54.7 kg (120 lb 9.5 oz) (09/08 0500) HEMODYNAMICS:   VENTILATOR SETTINGS: Vent Mode:  [-] BIPAP FiO2 (%):  [40 %] 40 % Set Rate:  [8 bmp] 8 bmp INTAKE / OUTPUT:  Intake/Output Summary (Last 24 hours) at 05/07/14 0820 Last data filed at 05/07/14 0600  Gross per 24 hour  Intake 1356.67 ml  Output   2355 ml  Net -998.33 ml    PHYSICAL EXAMINATION: Gen: chronically ill appearing, no acute distress HEENT: NCAT, EOMi, OP clear PULM: Wheezing bilaterally, good air movement CV: [place], regular, no mgr, no JVD AB: BS+, soft, nontender, no hsm Ext: warm, scds, no clubbing, no cyanosis Neuro: Awake and alert, maew   LABS:  CBC  Recent Labs Lab 05/06/14 0932 05/06/14 1627  WBC 6.6 5.7  HGB 11.5* 10.9*  HCT 37.3 35.1*  PLT 169 164   Coag's No results found for this basename: APTT, INR,  in the last 168 hours BMET  Recent Labs Lab 05/06/14 0932 05/06/14 1627 05/07/14 0318  NA 140 140 140  K 3.4* 3.6* 5.6*  CL 99 101 101  CO2 BUN CREATININE 3.15* 3.37*  3.38* 1.63*  GLUCOSE 71  279* 32*   Electrolytes  Recent Labs Lab 05/06/14 0932 05/06/14 1627 05/07/14 0318  CALCIUM 8.7 8.0* 8.1*   Sepsis Markers  Recent Labs Lab 05/06/14 1627  LATICACIDVEN 0.7   ABG  Recent Labs Lab 05/06/14 1347 05/07/14 0510  PHART 7.208* 7.347*  PCO2ART 69.4* 57.1*  PO2ART 68.0* 82.7   Liver Enzymes No results found for this basename: AST, ALT, ALKPHOS, BILITOT, ALBUMIN,  in the last 168 hours Cardiac Enzymes No results found for this basename: TROPONINI, PROBNP,  in the last 168 hours Glucose  Recent Labs Lab 05/06/14 1316 05/06/14 1347 05/06/14 1613 05/06/14 1629 05/06/14 1657 05/07/14 0008  GLUCAP 98 161* 26* 150* 110* 70    Imaging Dg Chest 2 View  05/06/2014   CLINICAL DATA:  Dizziness during dialysis.  EXAM: CHEST  2 VIEW  COMPARISON:  01/11/2014  FINDINGS: Moderate to large right pleural effusion. Enlarged cardiopericardial silhouette. Prior CABG. Cephalization of blood flow with bilateral interstitial edema.  Coronary artery stents are also noted.  IMPRESSION: 1. Cardiomegaly with interstitial edema. 2. Large right pleural effusion, similar magnitude to prior, with passive atelectasis.   Electronically Signed   By: Herbie Baltimore M.D.   On: 05/06/2014 10:44     ASSESSMENT / PLAN:  PULMONARY  A: Chronic hypercapnic respiratory failure> due to severe COPD, exacerbated by pulm edema COPD with out exacerbation Right Mod Pleural Effusion -chronic,  P:   D/c BIPAP Scheduled Duoneb Considering poor overall health and grim prognosis, would not pursue thoracentesis Titrate O2 for sat >90%  CARDIOVASCULAR  A:  Bradycardia  CAD /Aortic Stenosis -last echo EF 65%, mod/severe AS , PAP 52  Hypotension  Atrial Fib/flutter on Amio PTA   P:  Cards following  BB , norvasc , cardizem , Amio per cardiology Transfer to tele Restart Plavix  Hep DVT   RENAL A:  ESRD on HD (M/W/F )  Hypokalemia   P:   Per renal Renal  Repeat BMET    GASTROINTESTINAL A:  No acute issues  P:   Restart diet   HEMATOLOGIC A:  Anemia of chronic illness  P:  Transfuse per protocol   INFECTIOUS A:  No fever or elevated WBC  P:    Abx:  start date , day   ENDOCRINE A:  DM   P:   SSI  Start diet  NEUROLOGIC A:  Altered mental status  -metabolic encephalopathy/hypercarbia > resolved P:   Avoid sedating meds  TODAY'S SUMMARY: 73 yo ESRD /HD admitted with mod/lg right pleural effusion and hypercarbic RF . Improved mentation on BIPAP and after volume removal. SHe is a DNR .  Move to Telemetry, transfer to [facility] service, PCCM off  [place], MD [place] PCCM Pager: [phone] Cell: [phone] If no response, call [phone]  05/07/2014, 8:20 AM

## 2014-05-07 NOTE — Progress Notes (Signed)
Patient transferred with belongings, medications, and chart to room 2 west 07 with nurse. Attached to monitor and new nurse at bedside. No new problems at this time.  [name]

## 2014-05-07 NOTE — Progress Notes (Signed)
Patient Name: [name] Date of Encounter: 05/07/2014  Active Problems:   COPD (chronic obstructive pulmonary disease)   Aortic stenosis, severe   PAF (paroxysmal atrial fibrillation)   CAD- CABG X 3 09/2009, HSRA/DES to native RCA 07/11/13   Sinus bradycardia   ESRD (end stage renal disease)   Acute respiratory failure with hypercapnia    Patient Profile: 73 yo female w/ hx CABG 2011, PCI RCA 2014 (NSTEMI), mod-severe AS, atrial flutter, ESRD on HD, anticoag w/ ASA/Plavix, was admitted 09/07 w/ near-syncope and bradycardia w/ HR 30s at times, MS changes, and hypotension.  SUBJECTIVE: Pt rouses easily to verbal, does not remember details, but says she felt bad yesterday. She currently denies chest pain or SOB. Says she gets dizzy at times at home, but no awareness of HR (high or low).  OBJECTIVE Filed Vitals:   05/07/14 0400 05/07/14 0500 05/07/14 0600 05/07/14 0700  BP: 106/37 109/37 103/49 103/38  Pulse: 47 47 48 48  Temp: 97.8 F (36.6 C)     TempSrc: Axillary     Resp: Height:      Weight:  120 lb 9.5 oz (54.7 kg)    SpO2: 98% 100% 100% 93%    Intake/Output Summary (Last 24 hours) at 05/07/14 0838 Last data filed at 05/07/14 0600  Gross per 24 hour  Intake 1356.67 ml  Output   2355 ml  Net -998.33 ml   Filed Weights   05/06/14 1950 05/07/14 0011 05/07/14 0500  Weight: 126 lb 1.7 oz (57.2 kg) 120 lb 9.5 oz (54.7 kg) 120 lb 9.5 oz (54.7 kg)    PHYSICAL EXAM General: Well developed, well nourished, female in no acute distress. Head: Normocephalic, atraumatic.  Neck: Supple without bruits, JVD at 9cm. Lungs:  Resp regular and unlabored, decreased BS bases. Heart: RRR, S1, + S2, no S3, S4, 3/6 murmur; no rub. Abdomen: Soft, non-tender, non-distended, BS present.  Extremities: No clubbing, cyanosis, no edema.  Neuro: Alert and oriented X 3. Moves all extremities spontaneously. Psych: Normal affect.  LABS: CBC: Recent Labs  05/06/14 0932  05/06/14 1627  WBC 6.6 5.7  NEUTROABS 4.3  --   HGB 11.5* 10.9*  HCT 37.3 35.1*  MCV 94.9 93.1  PLT 169 164   Basic Metabolic Panel: Recent Labs  05/06/14 1627 05/07/14 0318  NA 140 140  K 3.6* 5.6*  CL 101 101  CO2 26 27  GLUCOSE 279* 32*  BUN 18 6  CREATININE 3.37*  3.38* 1.63*  CALCIUM 8.0* 8.1*    Recent Labs  05/06/14 1306  TROPIPOC 0.00   Hemoglobin A1C: Recent Labs  05/06/14 1627  HGBA1C 8.2*   TELE: Sinus bradycardia, initially 30s, now 40s, rare PVCs, prolonged PR and QTc.       Radiology/Studies: Dg Chest 2 View 05/06/2014   CLINICAL DATA:  Dizziness during dialysis.  EXAM: CHEST  2 VIEW  COMPARISON:  01/11/2014  FINDINGS: Moderate to large right pleural effusion. Enlarged cardiopericardial silhouette. Prior CABG. Cephalization of blood flow with bilateral interstitial edema.  Coronary artery stents are also noted.  IMPRESSION: 1. Cardiomegaly with interstitial edema. 2. Large right pleural effusion, similar magnitude to prior, with passive atelectasis.   Electronically Signed   By: Herbie Baltimore M.D.   On: 05/06/2014 10:44   Dg Chest Port 1 View 05/07/2014   CLINICAL DATA:  pleural effusion  EXAM: PORTABLE CHEST - 1 VIEW  COMPARISON:  the previous day's study  FINDINGS: Previous CABG. Stable cardiomegaly. Layering moderate right pleural effusion with consolidation/atelectasis at the right lung base, not convincingly changed in size given differences in patient positioning. Mild central pulmonary vascular congestion suspected. Left lung clear.  IMPRESSION: 1. Stable cardiomegaly and moderate right pleural effusion.   Electronically Signed   By: Oley Balm M.D.   On: 05/07/2014 07:52   Current Medications:  . Chlorhexidine Gluconate Cloth  6 each Topical Q0600  . heparin  5,000 Units Subcutaneous 3 times per day  . insulin aspart  0-9 Units Subcutaneous 6 times per day  . mupirocin ointment  1 application Nasal BID  . pantoprazole (PROTONIX) IV  40 mg  Intravenous QHS  . sodium chloride  3 mL Intravenous Q12H   . dextrose 5 % and 0.45% NaCl    . dextrose 5 % and 0.9% NaCl 100 mL/hr at 05/07/14 0400    ASSESSMENT AND PLAN: Active Problems:   COPD (chronic obstructive pulmonary disease) - per CCM    Aortic stenosis, severe - mean gradient 43, peak 81, by echo 09/2013.    PAF (paroxysmal atrial fibrillation) - amio and Dilt stopped, maintaining SR    CAD- CABG X 3 09/2009, HSRA/DES to native RCA 07/11/13 - no acute ischemic symptoms '   Sinus bradycardia - no pauses > 3 sec, HR slowly increasing off Dilt, amio. Follow. Maintaining BP. MD advise if EP should see.    ESRD (end stage renal disease) - on HD, volume mgt per Renal/CCM    Acute respiratory failure with hypercapnia -  per CCM, Renal teams.   Signed, Theodore Demark , PA-C 8:38 AM 05/07/2014   I have personally seen and examined this patient with Theodore Demark, PA-C.  I agree with the assessment and plan as outlined above. Hypotension and dizziness likely due to bradycardia. Metoprolol and diltiazem on hold and HR improving. No indication for pacemaker at this time. Would follow on telemetry for 24 more hours. Pt also with known severe AS but no syncope/angina. No plans for consideration of aortic valve replacement at this time. Volume management per HD. OK for transfer to telemetry unit.    MCALHANY,CHRISTOPHER 05/07/2014 10:17 AM

## 2014-05-07 NOTE — Progress Notes (Signed)
4 hour hemodialysis Tx complete. Unable to achieve goal of 3-3.5L due to frequent drops in BP during the first half of her Tx. Able to pull 2355cc of fluid. Pt had much improvement in resp and mental status post HD Tx. Cassie Kitchen5

## 2014-05-07 NOTE — Progress Notes (Signed)
Utilization Review Completed.Dorcas Carrow T9/03/2014

## 2014-05-07 NOTE — Progress Notes (Signed)
Hypoglycemic Event  CBG: 32  Treatment: D50 IV 50 mL  Symptoms: None  Follow-up CBG: Time:0510 CBG Result:147  Possible Reasons for Event: Unknown  Comments/MD notified:NA    [name]  Remember to initiate Hypoglycemia Order Set & complete

## 2014-05-07 NOTE — Plan of Care (Signed)
Problem: ICU Phase Progression Outcomes Goal: Flu/PneumoVaccines if indicated Outcome: Not Met (add Reason) Denies.  Goal: Voiding-avoid urinary catheter unless indicated Outcome: Not Applicable Date Met:  48/47/20 Anuric.

## 2014-05-07 NOTE — Progress Notes (Signed)
  [facility] Progress Note   Subjective: feels much better, 2.5kg off with HD yesterday  Filed Vitals:   05/07/14 0600 05/07/14 0700 05/07/14 0800 05/07/14 0900  BP: 103/49 103/38 118/41 114/39  Pulse: 48 48 47 30  Temp:      TempSrc:      Resp: Height:      Weight:      SpO2: 100% 93% 97% 96%   Exam: More alert, responsive, no distress No jvd Chest faint rales, improved today RRR 3/6 blowing SEM  Abd soft, no ascites  LE edema trace today Neuro is alert, nf, cachectic  HD: [facility] MWF  Last admit April 2015 no heparin, 57kg, 4.5 hours, LUA AVF   Assessment:  1 AMS - resolved 2 Acute resp failure resolved 3 Vol excess - improving 4 Severe COPD  5 Mod severe AS, not surg candidate  6 CAD hx CABG, medical Rx  7 DNR  8 ESRD on HD  9 Bradycardia 10 Hyperkalemia    Plan- Kayexalate x 1, HD tomorrow, remove vol and lower dry wt further    Vinson Moselle MD  pager [phone]    cell [phone]  05/07/2014, 10:13 AM     Recent Labs Lab 05/06/14 0932 05/06/14 1627 05/07/14 0318  NA 140 140 140  K 3.4* 3.6* 5.6*  CL 99 101 101  CO2 GLUCOSE 71 279* 32*  BUN CREATININE 3.15* 3.37*  3.38* 1.63*  CALCIUM 8.7 8.0* 8.1*   No results found for this basename: AST, ALT, ALKPHOS, BILITOT, PROT, ALBUMIN,  in the last 168 hours  Recent Labs Lab 05/06/14 0932 05/06/14 1627  WBC 6.6 5.7  NEUTROABS 4.3  --   HGB 11.5* 10.9*  HCT 37.3 35.1*  MCV 94.9 93.1  PLT 169 164   . antiseptic oral rinse  7 mL Mouth Rinse BID  . Chlorhexidine Gluconate Cloth  6 each Topical Q0600  . clopidogrel  75 mg Oral Daily  . heparin  5,000 Units Subcutaneous 3 times per day  . insulin aspart  0-9 Units Subcutaneous 6 times per day  . ipratropium-albuterol  3 mL Nebulization QID  . mupirocin ointment  1 application Nasal BID  . pantoprazole (PROTONIX) IV  40 mg Intravenous QHS  . sodium chloride  3 mL Intravenous Q12H   . dextrose 5 %  and 0.45% NaCl    . dextrose 5 % and 0.9% NaCl 100 mL/hr at 05/07/14 0800   sodium chloride, sodium chloride, sodium chloride, albuterol, dextrose, feeding supplement (NEPRO CARB STEADY), heparin, lidocaine (PF), lidocaine-prilocaine, pentafluoroprop-tetrafluoroeth, sodium chloride

## 2014-05-08 LAB — CBC
HEMATOCRIT: 35.8 % — AB (ref 36.0–46.0)
HEMOGLOBIN: 11 g/dL — AB (ref 12.0–15.0)
MCH: 29.5 pg (ref 26.0–34.0)
MCHC: 30.7 g/dL (ref 30.0–36.0)
MCV: 96 fL (ref 78.0–100.0)
Platelets: 159 10*3/uL (ref 150–400)
RBC: 3.73 MIL/uL — AB (ref 3.87–5.11)
RDW: 22.1 % — ABNORMAL HIGH (ref 11.5–15.5)
WBC: 7 10*3/uL (ref 4.0–10.5)

## 2014-05-08 LAB — GLUCOSE, CAPILLARY
GLUCOSE-CAPILLARY: 42 mg/dL — AB (ref 70–99)
GLUCOSE-CAPILLARY: 186 mg/dL — AB (ref 70–99)
GLUCOSE-CAPILLARY: 191 mg/dL — AB (ref 70–99)
Glucose-Capillary: 120 mg/dL — ABNORMAL HIGH (ref 70–99)
Glucose-Capillary: 119 mg/dL — ABNORMAL HIGH (ref 70–99)
Glucose-Capillary: 162 mg/dL — ABNORMAL HIGH (ref 70–99)
Glucose-Capillary: 101 mg/dL — ABNORMAL HIGH (ref 70–99)

## 2014-05-08 LAB — BASIC METABOLIC PANEL
ANION GAP: 14 (ref 5–15)
BUN: 14 mg/dL (ref 6–23)
CHLORIDE: 97 meq/L (ref 96–112)
CO2: 25 meq/L (ref 19–32)
CREATININE: 3.02 mg/dL — AB (ref 0.50–1.10)
Calcium: 8.4 mg/dL (ref 8.4–10.5)
GFR calc Af Amer: 17 mL/min — ABNORMAL LOW (ref >90)
GFR calc non Af Amer: 15 mL/min — ABNORMAL LOW (ref >90)
Glucose, Bld: 182 mg/dL — ABNORMAL HIGH (ref 70–99)
Potassium: 4.2 mEq/L (ref 3.7–5.3)
Sodium: 136 mEq/L — ABNORMAL LOW (ref 137–147)

## 2014-05-08 MED ORDER — PANTOPRAZOLE SODIUM 40 MG PO TBEC
40.0000 mg | DELAYED_RELEASE_TABLET | Freq: Every day | ORAL | Status: DC
  Administered 2014-05-09: 40 mg via ORAL
  Filled 2014-05-08 (×2): qty 1

## 2014-05-08 MED ORDER — IPRATROPIUM-ALBUTEROL 0.5-2.5 (3) MG/3ML IN SOLN: 3.0000 mL | Freq: Four times a day (QID) | RESPIRATORY_TRACT | Status: DC

## 2014-05-08 MED ORDER — ATORVASTATIN CALCIUM 40 MG PO TABS
40.0000 mg | ORAL_TABLET | Freq: Every day | ORAL | Status: DC
  Administered 2014-05-08: 40 mg via ORAL
  Filled 2014-05-08 (×3): qty 1

## 2014-05-08 MED ORDER — ACETAMINOPHEN 325 MG PO TABS
650.0000 mg | ORAL_TABLET | ORAL | Status: DC | PRN
  Administered 2014-05-08 – 2014-05-09 (×3): 650 mg via ORAL
  Filled 2014-05-08 (×2): qty 2

## 2014-05-08 MED ORDER — INSULIN ASPART 100 UNIT/ML ~~LOC~~ SOLN
0.0000 [IU] | Freq: Three times a day (TID) | SUBCUTANEOUS | Status: DC
  Administered 2014-05-09: 3 [IU] via SUBCUTANEOUS
  Administered 2014-05-09 (×2): 2 [IU] via SUBCUTANEOUS

## 2014-05-08 MED ORDER — ACETAMINOPHEN 325 MG PO TABS
ORAL_TABLET | ORAL | Status: AC
  Filled 2014-05-08: qty 2

## 2014-05-08 MED ORDER — IPRATROPIUM-ALBUTEROL 0.5-2.5 (3) MG/3ML IN SOLN
3.0000 mL | Freq: Four times a day (QID) | RESPIRATORY_TRACT | Status: DC
  Administered 2014-05-08 – 2014-05-09 (×2): 3 mL via RESPIRATORY_TRACT
  Filled 2014-05-08 (×2): qty 3

## 2014-05-08 NOTE — Progress Notes (Signed)
Patient Name: [name] Date of Encounter: 05/08/2014  Active Problems:   COPD (chronic obstructive pulmonary disease)   Aortic stenosis, severe   PAF (paroxysmal atrial fibrillation)   CAD- CABG X 3 09/2009, HSRA/DES to native RCA 07/11/13   Sinus bradycardia   ESRD (end stage renal disease)   Acute respiratory failure with hypercapnia    Patient Profile: 73 yo female w/ hx CABG 2011, PCI RCA 2014 (NSTEMI), mod-severe AS, atrial flutter, ESRD on HD, anticoag w/ ASA/Plavix, was admitted 09/07 w/ near-syncope and bradycardia w/ HR 30s at times, MS changes, and hypotension.   SUBJECTIVE: Pt more awake than yesterday, seen in HD, denies chest pain or SOB. Resting comfortably  OBJECTIVE Filed Vitals:   05/08/14 0729 05/08/14 0800 05/08/14 0830 05/08/14 0900  BP: 127/55 125/47 147/61 108/49  Pulse: 56 59 65 62  Temp:      TempSrc:      Resp:      Height:      Weight:      SpO2:  96% 97%     Intake/Output Summary (Last 24 hours) at 05/08/14 0910 Last data filed at 05/08/14 0014  Gross per 24 hour  Intake    700 ml  Output    653 ml  Net     47 ml   Filed Weights   05/07/14 0500 05/08/14 0500 05/08/14 0722  Weight: 120 lb 9.5 oz (54.7 kg) 122 lb 5.7 oz (55.5 kg) 118 lb 2.7 oz (53.6 kg)    PHYSICAL EXAM General: Well developed, well nourished, female in no acute distress. Head: Normocephalic, atraumatic.  Neck: Supple without bruits, JVD elevated Lungs:  Resp regular and unlabored, rales bases. Heart: RRR, S1, S2, no S3, S4, 2-3/6 AS murmur; no rub. Abdomen: Soft, non-tender, non-distended, BS + x 4.  Extremities: No clubbing, cyanosis, no edema.  Neuro: Alert and oriented X 3. Moves all extremities spontaneously. Psych: Normal affect.  LABS: CBC: Recent Labs  05/06/14 0932  05/07/14 0550 05/08/14 0732  WBC 6.6  < > 5.9 7.0  NEUTROABS 4.3  --   --   --   HGB 11.5*  < > 12.3 11.0*  HCT 37.3  < > 39.4 35.8*  MCV 94.9  < > 94.5 96.0  PLT 169  < > 138*  159  < > = values in this interval not displayed.  Basic Metabolic Panel: Recent Labs  05/07/14 1420 05/08/14 0732  NA 135* 136*  K 6.1* 4.2  CL 99 97  CO2 21 25  GLUCOSE 191* 182*  BUN 8 14  CREATININE 2.15* 3.02*  CALCIUM 8.2* 8.4    Recent Labs  05/06/14 1306  TROPIPOC 0.00   Hemoglobin A1C: Recent Labs  05/06/14 1627  HGBA1C 8.2*    TELE:  SR, seen in HD  Radiology/Studies: Dg Chest 2 View  05/06/2014   CLINICAL DATA:  Dizziness during dialysis.  EXAM: CHEST  2 VIEW  COMPARISON:  01/11/2014  FINDINGS: Moderate to large right pleural effusion. Enlarged cardiopericardial silhouette. Prior CABG. Cephalization of blood flow with bilateral interstitial edema.  Coronary artery stents are also noted.  IMPRESSION: 1. Cardiomegaly with interstitial edema. 2. Large right pleural effusion, similar magnitude to prior, with passive atelectasis.   Electronically Signed   By: Herbie Baltimore M.D.   On: 05/06/2014 10:44   Dg Chest Port 1 View  05/07/2014   CLINICAL DATA:  pleural effusion  EXAM: PORTABLE CHEST - 1 VIEW  COMPARISON:  the previous day's study  FINDINGS: Previous CABG. Stable cardiomegaly. Layering moderate right pleural effusion with consolidation/atelectasis at the right lung base, not convincingly changed in size given differences in patient positioning. Mild central pulmonary vascular congestion suspected. Left lung clear.  IMPRESSION: 1. Stable cardiomegaly and moderate right pleural effusion.   Electronically Signed   By: Oley Balm M.D.   On: 05/07/2014 07:52     Current Medications:  . acetaminophen      . antiseptic oral rinse  7 mL Mouth Rinse BID  . Chlorhexidine Gluconate Cloth  6 each Topical Q0600  . clopidogrel  75 mg Oral Daily  . heparin  5,000 Units Subcutaneous 3 times per day  . insulin aspart  0-9 Units Subcutaneous 6 times per day  . ipratropium-albuterol  3 mL Nebulization QID  . mupirocin ointment  1 application Nasal BID  . pantoprazole  (PROTONIX) IV  40 mg Intravenous QHS  . sodium chloride  3 mL Intravenous Q12H   . dextrose 30 mL/hr at 05/07/14 1209    ASSESSMENT AND PLAN: Active Problems:  COPD (chronic obstructive pulmonary disease) - No wheezing. per IM   Aortic stenosis, severe - mean gradient 43, peak 81, by echo 09/2013.   PAF (paroxysmal atrial fibrillation) - amio and Dilt stopped, maintaining SR   CAD- CABG X 3 09/2009, HSRA/DES to native RCA 07/11/13 - no acute ischemic symptoms   Sinus bradycardia - no pauses > 3 sec, HR continues to slowly increase off Dilt, amio. Follow.  BP improving as well. No PPM needed at this time.   ESRD (end stage renal disease) - on HD, volume mgt per Renal/IM   Acute respiratory failure with hypercapnia - Improved, per IM, Renal teams.   Signed, Theodore Demark , PA-C 9:10 AM 05/08/2014  I have personally seen and examined this patient with Theodore Demark, PA-C. I agree with the assessment and plan as outlined above. Bradycardia is resolved off of amiodarone and Diltiazem. Would avoid AV nodal blocking agents. If there is recurrence of atrial fibrillation, will need referral to EP for further management as she is not likely going to tolerate AV nodal blocking agents. Continue to manage aortic stenosis conservatively. She can be discharged from cardiac standpoint. She will need f/u with Dr. Tresa Endo in the [facility] office on [address] within 2-3 weeks post discharge. We will sign off. Please call with questions.   Truong Delcastillo 05/08/2014 9:53 AM

## 2014-05-08 NOTE — Procedures (Signed)
I was present at this dialysis session. I have reviewed the session itself and made appropriate changes.   HR improved.  Goal UF 2.5L.  BMET Pending  Sabra Heck  MD 05/08/2014, 8:33 AM

## 2014-05-08 NOTE — Progress Notes (Signed)
Hemodialysis- Completed without issue. Uf goal 2.5L met. BP stable. Report called, transferred back to room, central telemetry notified.

## 2014-05-08 NOTE — Progress Notes (Signed)
[place] TEAM 1 - Stepdown/ICU TEAM Progress Note  NEVAEN TREDWAY WUJ:[id number] DOB: [birth year] DOA: 05/06/2014 PCP: Galvin Proffer, MD ([place], [place])  Admit HPI / Brief Narrative: 73yo w/ hx ESRD on HD who was transferred to the ED after becoming bradycardic and hypotensive w/ dizziness during HD. She was found to be hypercarbic placed on BIPAP in the ER, w/ PCCM admitting her.   SIGNIFICANT EVENTS:  9/7 HD, 2.5 L removed  9/8 Off BIPAP, comfortable, wants to eat and get out of bed  HPI/Subjective: Pt is resting comfortably in bed eating lunch.  She has no new complaints. She states she intends to go back home where she lives with "a friend" at d/c.    Assessment/Plan:  Chronic hypercapnic respiratory failure> due to severe COPD, exacerbated by pulm edema Appears to be stabilizing at this point, but pt has not been out of bed - PT/OT to see   COPD with out exacerbation  Sable at present   Right Mod Pleural Effusion Chronic  Bradycardia  HR continues to slowly increase off dilt, amio - Cardiology following - no further tx/eval planned unless recurs   CAD  CABG 2011, PCI RCA 2014 (NSTEMI) - no acute ischemic symptoms   Aortic Stenosis last echo EF 65%, mod/severe AS , PAP 52 - as per Cardiology - will make pt more prone to sx of hypotension  Hypotension  Stabilizing - follow w/ HD planned today  Parox Atrial Fib/flutter on Amio PTA  amio and dilt stopped, maintaining SR  ESRD on HD (M/W/F )   Hypokalemia  Corrected w/ HD   Anemia of chronic illness  Hgb stable   DM  CBG reasonably well controlled - follow   Altered mental status - metabolic encephalopathy/hypercarbia Resolved  MRSA screen +  Code Status: FULL Family Communication: no family present at time of exam Disposition Plan: PT/OT evals - possible d/c home in next 24-48hrs  Consultants: Cardiology  PCCM  Antibiotics: none  DVT prophylaxis: SQ heparin  Objective: Blood pressure 115/48,  pulse 58, temperature 98.1 F (36.7 C), temperature source Oral, resp. rate 19, height  (1.626 m), weight 51.4 kg (113 lb 5.1 oz), SpO2 100.00%.  Intake/Output Summary (Last 24 hours) at 05/08/14 1203 Last data filed at 05/08/14 1122  Gross per 24 hour  Intake    600 ml  Output   3153 ml  Net  -2553 ml   Exam: General: No acute respiratory distress Lungs: Clear to auscultation bilaterally without wheezes or crackles Cardiovascular: Regular rate and rhythm with prominent 3/6 holosystolic M Abdomen: Nontender, nondistended, soft, bowel sounds positive, no rebound, no ascites, no appreciable mass Extremities: No significant cyanosis, clubbing, or edema bilateral lower extremities  Data Reviewed: Basic Metabolic Panel:  Recent Labs Lab 05/06/14 0932 05/06/14 1627 05/07/14 0318 05/07/14 1420 05/08/14 0732  NA 140 140 140 135* 136*  K 3.4* 3.6* 5.6* 6.1* 4.2  CL 99 101 101 99 97  CO2 GLUCOSE 71 279* 32* 191* 182*  BUN CREATININE 3.15* 3.37*  3.38* 1.63* 2.15* 3.02*  CALCIUM 8.7 8.0* 8.1* 8.2* 8.4   CBC:  Recent Labs Lab 05/06/14 0932 05/06/14 1627 05/07/14 0550 05/08/14 0732  WBC 6.6 5.7 5.9 7.0  NEUTROABS 4.3  --   --   --   HGB 11.5* 10.9* 12.3 11.0*  HCT 37.3 35.1* 39.4 35.8*  MCV 94.9 93.1 94.5 96.0  PLT 169 164  138* 159   CBG:  Recent Labs Lab 05/07/14 1604 05/07/14 2010 05/08/14 0126 05/08/14 0339 05/08/14 0719  GLUCAP 191* 345* 120* 119* 162*    Recent Results (from the past 240 hour(s))  MRSA PCR SCREENING     Status: Abnormal   Collection Time    05/06/14  5:19 PM      Result Value Ref Range Status   MRSA by PCR POSITIVE (*) NEGATIVE Final   Comment:            The GeneXpert MRSA Assay (FDA     approved for NASAL specimens     only), is one component of a     comprehensive MRSA colonization     surveillance program. It is not     intended to diagnose MRSA     infection nor to guide or     monitor  treatment for     MRSA infections.     RESULT CALLED TO, READ BACK BY AND VERIFIED WITH:     ERIC DAVIS,RN [id number] [address] [facility] M    Studies:  Recent x-ray studies have been reviewed in detail by the Attending Physician  Scheduled Meds:  Scheduled Meds: . acetaminophen      . antiseptic oral rinse  7 mL Mouth Rinse BID  . Chlorhexidine Gluconate Cloth  6 each Topical Q0600  . clopidogrel  75 mg Oral Daily  . heparin  5,000 Units Subcutaneous 3 times per day  . insulin aspart  0-9 Units Subcutaneous 6 times per day  . ipratropium-albuterol  3 mL Nebulization QID  . mupirocin ointment  1 application Nasal BID  . pantoprazole (PROTONIX) IV  40 mg Intravenous QHS  . sodium chloride  3 mL Intravenous Q12H    Time spent on care of this patient: 35 mins   MCCLUNG,JEFFREY T , MD   [facility] Office  [phone] Pager - Text Page per Loretha Stapler as per below:  On-Call/Text Page:      [email or web address]      password [email or web address]  If 7PM-7AM, please contact night-coverage [email or web address] Password [email or web address] 05/08/2014, 12:03 PM   LOS: 2 days

## 2014-05-09 ENCOUNTER — Ambulatory Visit: Payer: Medicare Other | Admitting: Cardiovascular Disease

## 2014-05-09 LAB — GLUCOSE, CAPILLARY
GLUCOSE-CAPILLARY: 176 mg/dL — AB (ref 70–99)
GLUCOSE-CAPILLARY: 151 mg/dL — AB (ref 70–99)
Glucose-Capillary: 208 mg/dL — ABNORMAL HIGH (ref 70–99)

## 2014-05-09 MED ORDER — IPRATROPIUM-ALBUTEROL 0.5-2.5 (3) MG/3ML IN SOLN
3.0000 mL | Freq: Three times a day (TID) | RESPIRATORY_TRACT | Status: DC
  Administered 2014-05-09: 3 mL via RESPIRATORY_TRACT
  Filled 2014-05-09: qty 3

## 2014-05-09 NOTE — Care Management Note (Addendum)
    Page 1 of 2   05/09/2014     2:02:19 PM CARE MANAGEMENT NOTE 05/09/2014  Patient:  [name], [name]   Account Number:  [id number]  Date Initiated:  05/09/2014  Documentation initiated by:  Cortney Mckinney  Subjective/Objective Assessment:   Pt adm on 05/06/14 with bradycardia, respiratory failure. PTA, pt resides at home with family member.  She is on chronic home oxygen, and is active with [employer] for [facility] services.     Action/Plan:   Pt for dc home today.  Requests [facility] for home.  Pt agreeable to resume HH services with Genev[facility]lander as prior to admission.   Anticipated DC Date:  05/09/2014   Anticipated DC Plan:  HOME W HOME HEALTH SERVICES      DC Planning Services  CM consult      [facility] Choice  Resumption Of Svcs/PTA Provider   Choice offered to / List presented to:  C-1 Patient   DME arranged  BEDSIDE COMMODE      DME agency  [facility]     [facility] arranged  HH-1 RN  HH-2 PT  HH-3 OT  HH-4 NURSE'S AIDE      HH agency  [facility]   Status of service:  Completed, signed off Medicare Important Message given?  YES (If response is "NO", the following Medicare IM given date fields will be blank) Date Medicare IM given:  05/09/2014 Medicare IM given by:  Benelli Winther Date Additional Medicare IM given:   Additional Medicare IM given by:    Discharge Disposition:  HOME W HOME HEALTH SERVICES  Per UR Regulation:  Reviewed for med. necessity/level of care/duration of stay  If discussed at Long Length of Stay Meetings, dates discussed:    Comments:  05/09/14 Sidney Ace, RN, BSN [phone] Notified by RN that pt stating that portable O2 tank is not working, and she does not have one for transport home.  Pt has oxygen provided by [facility].  Notified [facility] to bring portable O2 tank to pt prior to dc for ride home.  05/09/14 Sidney Ace, RN, BSN [phone] Pt's family member instructed to bring portable oxygen tank from home for transport home from hospital.   Referral to [facility] for resumption of homecare; start of care 24-48h post dc date.

## 2014-05-09 NOTE — Discharge Summary (Signed)
Physician Discharge Summary  [name] VFI:[id number] DOB: 1941/06/22 DOA: 05/06/2014  PCP: Bonnita Nasuti, MD  Admit date: 05/06/2014 Discharge date: 05/09/2014  Time spent: greater than 30 minutes    Discharge Diagnoses:  Primary diagnosis:  Acute respiratory failure with hypercapnia Active Problems:   COPD (chronic obstructive pulmonary disease) exacerbation   Aortic stenosis, severe   PAF (paroxysmal atrial fibrillation)   CAD- CABG X 3 09/2009, HSRA/DES to native RCA 07/11/13   Sinus bradycardia   ESRD (end stage renal disease)     Discharge Condition: stable  Filed Weights   05/08/14 0722 05/08/14 1122 05/09/14 0500  Weight: 53.6 kg (118 lb 2.7 oz) 51.4 kg (113 lb 5.1 oz) 53.6 kg (118 lb 2.7 oz)    History of present illness/hospital course  73yo w/ hx ESRD on HD who was transferred to the ED after becoming bradycardic and hypotensive w/ dizziness during HD. She was found to be hypercarbic placed on BIPAP in the ER, w/ PCCM admitting her. Subsequently transferred to hospitalists Chronic hypercapnic respiratory failure> due to severe COPD, exacerbated by pulm edema  Improved with bipap, nebulizers, dialysis  Right Mod Pleural Effusion  Chronic  Bradycardia  Cardiology consulted and discontinued diltiazem and amiodarone  CAD  CABG 2011, PCI RCA 2014 (NSTEMI) - no acute ischemic symptoms   Aortic Stenosis  last echo EF 65%, mod/severe AS , PAP 52 - inoperable  Hypotension  Improved off diltiazem. Amlodipine stopped  Parox Atrial Fib/flutter on Amio PTA  amio and dilt stopped, maintaining SR   ESRD on HD (M/W/F )   Hypokalemia  Corrected w/ HD   Anemia of chronic illness  Hgb stable   DM  CBG reasonably well controlled  Altered mental status - metabolic encephalopathy/hypercarbia  Resolved   Consultants:  Cardiology  PCCM    Discharge Exam: Filed Vitals:   05/09/14 0815  BP: 114/38  Pulse: 64  Temp: 98.6 F (37 C)  Resp: 18    General:  alert, oriented Cardiovascular: RRR Respiratory: CTA Ext no edema   Discharge Instructions   Diet - low sodium heart healthy    Complete by:  As directed      Diet Carb Modified    Complete by:  As directed      Discharge instructions    Complete by:  As directed   Stop amiodarone diltiazem and amlodipine     Increase activity slowly    Complete by:  As directed           Current Discharge Medication List    CONTINUE these medications which have NOT CHANGED   Details  acetaminophen (TYLENOL) 325 MG tablet Take 650 mg by mouth every 6 (six) hours as needed for moderate pain.    aspirin 81 MG chewable tablet Chew 81 mg by mouth daily.     atorvastatin (LIPITOR) 40 MG tablet Take 40 mg by mouth daily.    b complex-vitamin c-folic acid (NEPHRO-VITE) 0.8 MG TABS tablet Take 1 tablet by mouth at bedtime.    bisacodyl (DULCOLAX) 5 MG EC tablet Take 10 mg by mouth 2 (two) times daily as needed for mild constipation or moderate constipation.    clopidogrel (PLAVIX) 75 MG tablet Take 75 mg by mouth daily with breakfast.    HYDROcodone-acetaminophen (NORCO/VICODIN) 5-325 MG per tablet Take 1 tablet by mouth every 8 (eight) hours as needed for moderate pain.    insulin aspart (NOVOLOG) 100 UNIT/ML injection Inject 2-10 Units into the skin  See admin instructions. Sliding scale. 151-200 use 2 units; 201-250 use 4 units; 251-300 use 6 units; 301-350 use 8 units; 351-400 use 10 units    loratadine (CLARITIN) 10 MG tablet Take 10 mg by mouth daily.    nitroGLYCERIN (NITROSTAT) 0.4 MG SL tablet Place 0.4 mg under the tongue every 5 (five) minutes as needed for chest pain.    ACCU-CHEK AVIVA PLUS test strip     ASSURE COMFORT LANCETS 30G MISC     Blood Glucose Monitoring Suppl (ACCU-CHEK AVIVA PLUS) W/DEVICE KIT       STOP taking these medications     amiodarone (PACERONE) 200 MG tablet      amLODipine (NORVASC) 2.5 MG tablet      diltiazem (CARDIZEM CD) 180 MG 24 hr capsule         Allergies  Allergen Reactions  . Codeine Rash  . Sulfa Antibiotics Other (See Comments)    unknown   Follow-up Information   Follow up with KELLY,THOMAS A, MD In 2 weeks.   Specialty:  Cardiology   Contact information:   [address] [address] [phone]        The results of significant diagnostics from this hospitalization (including imaging, microbiology, ancillary and laboratory) are listed below for reference.    Significant Diagnostic Studies: Dg Chest 2 View  05/06/2014   CLINICAL DATA:  Dizziness during dialysis.  EXAM: CHEST  2 VIEW  COMPARISON:  01/11/2014  FINDINGS: Moderate to large right pleural effusion. Enlarged cardiopericardial silhouette. Prior CABG. Cephalization of blood flow with bilateral interstitial edema.  Coronary artery stents are also noted.  IMPRESSION: 1. Cardiomegaly with interstitial edema. 2. Large right pleural effusion, similar magnitude to prior, with passive atelectasis.   Electronically Signed   By: Sherryl Barters M.D.   On: 05/06/2014 10:44   Dg Chest Port 1 View  05/07/2014   CLINICAL DATA:  pleural effusion  EXAM: PORTABLE CHEST - 1 VIEW  COMPARISON:  the previous day's study  FINDINGS: Previous CABG. Stable cardiomegaly. Layering moderate right pleural effusion with consolidation/atelectasis at the right lung base, not convincingly changed in size given differences in patient positioning. Mild central pulmonary vascular congestion suspected. Left lung clear.  IMPRESSION: 1. Stable cardiomegaly and moderate right pleural effusion.   Electronically Signed   By: Arne Cleveland M.D.   On: 05/07/2014 07:52   ekg Sinus bradycardia RBBB and LPFB  Microbiology: Recent Results (from the past 240 hour(s))  MRSA PCR SCREENING     Status: Abnormal   Collection Time    05/06/14  5:19 PM      Result Value Ref Range Status   MRSA by PCR POSITIVE (*) NEGATIVE Final   Comment:            The GeneXpert MRSA Assay (FDA      approved for NASAL specimens     only), is one component of a     comprehensive MRSA colonization     surveillance program. It is not     intended to diagnose MRSA     infection nor to guide or     monitor treatment for     MRSA infections.     RESULT CALLED TO, READ BACK BY AND VERIFIED WITH:     ERIC DAVIS,RN [phone] [place]: Basic Metabolic Panel:  Recent Labs Lab 05/06/14 0932 05/06/14 1627 05/07/14 0318 05/07/14 1420 05/08/14 0732  NA 140 140 140  135* 136*  K 3.4* 3.6* 5.6* 6.1* 4.2  CL 99 101 101 99 97  CO2 26 26 27 21 25   GLUCOSE 71 279* 32* 191* 182*  BUN 17 18 6 8 14   CREATININE 3.15* 3.37*  3.38* 1.63* 2.15* 3.02*  CALCIUM 8.7 8.0* 8.1* 8.2* 8.4   Liver Function Tests: No results found for this basename: AST, ALT, ALKPHOS, BILITOT, PROT, ALBUMIN,  in the last 168 hours No results found for this basename: LIPASE, AMYLASE,  in the last 168 hours No results found for this basename: AMMONIA,  in the last 168 hours CBC:  Recent Labs Lab 05/06/14 0932 05/06/14 1627 05/07/14 0550 05/08/14 0732  WBC 6.6 5.7 5.9 7.0  NEUTROABS 4.3  --   --   --   HGB 11.5* 10.9* 12.3 11.0*  HCT 37.3 35.1* 39.4 35.8*  MCV 94.9 93.1 94.5 96.0  PLT 169 164 138* 159   Cardiac Enzymes: No results found for this basename: CKTOTAL, CKMB, CKMBINDEX, TROPONINI,  in the last 168 hours BNP: BNP (last 3 results)  Recent Labs  10/22/13 1439  PROBNP >70000.0*   CBG:  Recent Labs Lab 05/08/14 0719 05/08/14 1201 05/08/14 1634 05/08/14 2111 05/09/14 0642  GLUCAP 162* 101* 186* 191* 208*       Signed:  Jahmeir Geisen L  [facility] 05/09/2014, 10:38 AM

## 2014-05-09 NOTE — Evaluation (Signed)
Occupational Therapy Evaluation Patient Details Name: [name] MRN: [id number] DOB: [birth year] Today's Date: 05/09/2014    History of Present Illness Pt adm with bradycardia, hypotension, and resp failure. Placed on bipap and received HD. PMH - COPD, ESRD on HD, CHF, DM, HTN   Clinical Impression   Pt admitted with above. She demonstrates the below listed deficits and will benefit from continued OT to maximize safety and independence with BADLs.  Pt requires min A for LB ADLs due to fatigue and difficulty accessing feet.  Feel she will benefit from AE instruction to increase ease and independence with BADLs.  She has intermittent assistance at home.  PT is recommending HHPT to increase endurance and strength, no OT follow recommended at discharge.       Follow Up Recommendations  No OT follow up;Supervision - Intermittent    Equipment Recommendations  None recommended by OT    Recommendations for Other Services       Precautions / Restrictions Precautions Precautions: Fall      Mobility Bed Mobility Overal bed mobility: Needs Assistance Bed Mobility: Sidelying to Sit   Sidelying to sit: Min guard;HOB elevated       General bed mobility comments: Incr time  Transfers Overall transfer level: Modified independent Equipment used: Rolling walker (2 wheeled) Transfers: Sit to/from [employer] Sit to Stand: Modified independent (Device/Increase time) Stand pivot transfers: Modified independent (Device/Increase time)       General transfer comment: Used walker for bed to bsc     Balance Overall balance assessment: Needs assistance Sitting-balance support: Feet supported Sitting balance-Leahy Scale: Good     Standing balance support: No upper extremity supported Standing balance-Leahy Scale: Fair                              ADL Overall ADL's : Needs assistance/impaired Eating/Feeding: Independent   Grooming: Wash/dry  hands;Wash/dry face;Oral care;Brushing hair;Supervision/safety;Sitting;Standing;Set up   Upper Body Bathing: Set up;Sitting   Lower Body Bathing: Minimal assistance;Sit to/from stand   Upper Body Dressing : Set up;Sitting   Lower Body Dressing: Minimal assistance;Sit to/from stand   Toilet Transfer: Supervision/safety;Ambulation;RW   Toileting- Clothing Manipulation and Hygiene: Supervision/safety;Sit to/from stand       Functional mobility during ADLs: Supervision/safety;Rolling walker General ADL Comments: Pt reports she is familiar with AE, but has never used it, or been instructed in its use     Vision                     Perception     Praxis      Pertinent Vitals/Pain Pain Assessment: No/denies pain     Hand Dominance Right   Extremity/Trunk Assessment Upper Extremity Assessment Upper Extremity Assessment: Generalized weakness   Lower Extremity Assessment Lower Extremity Assessment: Defer to PT evaluation       Communication Communication Communication: No difficulties   Cognition Arousal/Alertness: Awake/alert Behavior During Therapy: WFL for tasks assessed/performed Overall Cognitive Status: Within Functional Limits for tasks assessed                     General Comments       Exercises       Shoulder Instructions      Home Living Family/patient expects to be discharged to:: Private residence Living Arrangements: Non-relatives/Friends Available Help at Discharge: Friend(s);Available 24 hours/day Type of Home: House Home Access: Stairs to enter [employer] of Steps:  5-7 Entrance Stairs-Rails: Right;Left Home Layout: One level     Bathroom Shower/Tub: Tub/shower unit Shower/tub characteristics: Curtain Firefighter: Standard     Home Equipment: Environmental consultant - 4 wheels;Shower seat          Prior Functioning/Environment Level of Independence: Needs assistance  Gait / Transfers Assistance Needed: Usually amb  with cane. Requires asssist for stairs. ADL's / Homemaking Assistance Needed: Pt states she needs assist with donning socks and occasionally requires assist for pants when she is fatigued (a family friend assists her).  She needs assist with grocery shopping         OT Diagnosis: Generalized weakness   OT Problem List: Decreased knowledge of use of DME or AE;Decreased strength   OT Treatment/Interventions: Self-care/ADL training;DME and/or AE instruction;Therapeutic activities;Patient/family education;Balance training    OT Goals(Current goals can be found in the care plan section) Acute Rehab OT Goals Patient Stated Goal: to go home  OT Goal Formulation: With patient Time For Goal Achievement: 05/17/14 Potential to Achieve Goals: Good ADL Goals Pt Will Perform Lower Body Bathing: with modified independence;with adaptive equipment;sit to/from stand Pt Will Perform Lower Body Dressing: with modified independence;with adaptive equipment;sit to/from stand  OT Frequency: Min 2X/week   Barriers to D/C:            Co-evaluation              End of Session Equipment Utilized During Treatment: Rolling walker Nurse Communication: Mobility status  Activity Tolerance: Patient tolerated treatment well Patient left: in chair;with call bell/phone within reach   Time: 1125-1138 OT Time Calculation (min): 13 min Charges:  OT General Charges $OT Visit: 1 Procedure OT Evaluation $Initial OT Evaluation Tier I: 1 Procedure G-Codes:    Geraldo Haris M 2014-05-13, 10:50 AM

## 2014-05-09 NOTE — Evaluation (Signed)
Physical Therapy Evaluation Patient Details Name: [name] MRN: [id number] DOB: [birth year] Today's Date: 05/09/2014   History of Present Illness  Pt adm with bradycardia, hypotension, and resp failure. Placed on bipap and received HD. PMH - COPD, ESRD on HD, CHF, DM, HTN  Clinical Impression  Pt admitted with above. Pt currently with functional limitations due to the deficits listed below (see PT Problem List).  Pt will benefit from skilled PT to increase their independence and safety with mobility to allow discharge home with friends.     Follow Up Recommendations Home health PT;Supervision - Intermittent    Equipment Recommendations  None recommended by PT    Recommendations for Other Services       Precautions / Restrictions Precautions Precautions: Fall      Mobility  Bed Mobility Overal bed mobility: Needs Assistance Bed Mobility: Sidelying to Sit   Sidelying to sit: Min guard;HOB elevated       General bed mobility comments: Incr time  Transfers Overall transfer level: Modified independent Equipment used: Rolling walker (2 wheeled) Transfers: Sit to/from [employer] Sit to Stand: Modified independent (Device/Increase time) Stand pivot transfers: Modified independent (Device/Increase time)       General transfer comment: Used walker for bed to bsc   Ambulation/Gait Ambulation/Gait assistance: Supervision Ambulation Distance (Feet): 100 Feet Assistive device: Rolling walker (2 wheeled) Gait Pattern/deviations: Step-through pattern;Decreased step length - right;Decreased step length - left Gait velocity: decr Gait velocity interpretation: Below normal speed for age/gender General Gait Details: Slow gait  Stairs            Wheelchair Mobility    Modified Rankin (Stroke Patients Only)       Balance Overall balance assessment: Needs assistance Sitting-balance support: Feet supported Sitting balance-Leahy Scale: Good      Standing balance support: No upper extremity supported Standing balance-Leahy Scale: Fair                               Pertinent Vitals/Pain Pain Assessment: No/denies pain    Home Living Family/patient expects to be discharged to:: Private residence Living Arrangements: Non-relatives/Friends Available Help at Discharge: Friend(s);Available 24 hours/day Type of Home: House Home Access: Stairs to enter Entrance Stairs-Rails: Doctor, general practice of Steps: 5-7 Home Layout: One level Home Equipment: Walker - 4 wheels;Shower seat      Prior Function Level of Independence: Needs assistance   Gait / Transfers Assistance Needed: Usually amb with cane. Requires asssist for stairs.  ADL's / Homemaking Assistance Needed: Pt states she needs assist with donning socks and occasionally requires assist for pants when she is fatigued (a family friend assists her).  She needs assist with grocery shopping         Hand Dominance   Dominant Hand: Right    Extremity/Trunk Assessment   Upper Extremity Assessment: Generalized weakness           Lower Extremity Assessment: Defer to PT evaluation         Communication   Communication: No difficulties  Cognition Arousal/Alertness: Awake/alert Behavior During Therapy: WFL for tasks assessed/performed Overall Cognitive Status: Within Functional Limits for tasks assessed                      General Comments      Exercises        Assessment/Plan    PT Assessment Patient needs continued PT services  PT Diagnosis  Generalized weakness   PT Problem List Decreased strength;Decreased balance;Decreased mobility  PT Treatment Interventions DME instruction;Gait training;Functional mobility training;Therapeutic activities;Therapeutic exercise;Balance training;Patient/family education   PT Goals (Current goals can be found in the Care Plan section) Acute Rehab PT Goals Patient Stated Goal: to go home   PT Goal Formulation: With patient Time For Goal Achievement: 05/14/14 Potential to Achieve Goals: Good    Frequency Min 3X/week   Barriers to discharge        Co-evaluation               End of Session Equipment Utilized During Treatment: Oxygen Activity Tolerance: Patient tolerated treatment well Patient left: in chair;with call bell/phone within reach Nurse Communication: Mobility status         Time: 1000-1020 PT Time Calculation (min): 20 min   Charges:   PT Evaluation $Initial PT Evaluation Tier I: 1 Procedure PT Treatments $Gait Training: 8-22 mins   PT G Codes:          [name] May 21, 2014, 10:59 AM  [facility] PT [phone]

## 2014-05-09 NOTE — Progress Notes (Signed)
Pt discharged home with friend Trinna Post. Discharge instructions given & reviewed Eduction discussed  IV dc'd  Tele dc'd  O2 tank, bsc and patient belongs sent home Pt discharged via wheelchair per RN  Darrel Hoover

## 2014-05-09 NOTE — Progress Notes (Signed)
Occupational Therapy Note  Pt instructed in use of AE for LB ADLs, and is able to perform with supervision.  She is eager to discharge home.    05/09/14 1100  OT Visit Information  Last OT Received On 05/09/14  Assistance Needed +1  History of Present Illness Pt adm with bradycardia, hypotension, and resp failure. Placed on bipap and received HD. PMH - COPD, ESRD on HD, CHF, DM, HTN  OT Time Calculation  OT Start Time 1055  OT Stop Time 1112  OT Time Calculation (min) 17 min  Precautions  Precautions Fall  Pain Assessment  Pain Assessment Faces  Faces Pain Scale 2  Pain Location abdomen   Pain Descriptors / Indicators Cramping  Pain Intervention(s) Monitored during session  Cognition  Arousal/Alertness Awake/alert  Behavior During Therapy WFL for tasks assessed/performed  Overall Cognitive Status Within Functional Limits for tasks assessed  ADL  Lower Body Bathing Supervison/ safety;With adaptive equipment;Sit to/from stand  Lower Body Dressing Supervision/safety;With adaptive equipment;Sit to/from stand  General ADL Comments Pt was instructed in use of AE for LB ADLs and was able to use with supervision   OT - End of Session  Equipment Utilized During Treatment Rolling walker;Other (comment) (AE)  Activity Tolerance Patient tolerated treatment well  Patient left in chair;with call bell/phone within reach;with chair alarm set  Nurse Communication Mobility status  OT Assessment/Plan  OT Plan Discharge plan remains appropriate  OT Frequency Min 2X/week  Follow Up Recommendations No OT follow up;Supervision - Intermittent  OT Equipment None recommended by OT  OT Goal Progression  Progress towards OT goals Progressing toward goals  ADL Goals  Pt Will Perform Lower Body Bathing with modified independence;with adaptive equipment;sit to/from stand  Pt Will Perform Lower Body Dressing with modified independence;with adaptive equipment;sit to/from stand  OT General Charges  $OT  Visit 1 Procedure  OT Treatments  $Self Care/Home Management  8-22 mins   [employer], OTR/L [phone]

## 2014-05-09 NOTE — Progress Notes (Signed)
  [facility] Progress Note   Subjective: up in chair, no  complaints  Filed Vitals:   05/08/14 1304 05/08/14 2116 05/09/14 0500 05/09/14 0815  BP: 111/50 114/39 107/39 114/38  Pulse: 62 63 63 64  Temp: 98 F (36.7 C) 99.9 F (37.7 C) 98.7 F (37.1 C) 98.6 F (37 C)  TempSrc: Oral Oral Oral Oral  Resp: Height:      Weight:   53.6 kg (118 lb 2.7 oz)   SpO2: 99% 100% 96% 97%   Exam: Alert, MS at baseline No jvd Chest faint bibasilar rales RRR 3/6 blowing SEM  Abd soft, no ascites  No LE edema Neuro is alert, nf, cachectic  HD: [facility] MWF  Last admit April 2015 no heparin, 57kg, 4.5 hours, LUA AVF   Assessment: 1 Acute resp failure / pulm edema / vol excess- due to lean body wt loss, will have lower dry wt (51.5kg) 2 Severe COPD  3 Mod severe AS, not surg candidate  4 CAD hx CABG, not surg candidate, medical Rx on statin, asa, plavix  5 DNR reconfirmed this with pt today , also confirmed there is out-of-hosp DNR order at the HD unit in [place] 6 ESRD on HD 7 Afib on amio, asa, dilt 8 Dispo for dc today per primary   Vinson Moselle MD  pager [phone]    cell [phone]  05/09/2014, 1:13 PM     Recent Labs Lab 05/07/14 0318 05/07/14 1420 05/08/14 0732  NA 140 135* 136*  K 5.6* 6.1* 4.2  CL 101 99 97  CO2 GLUCOSE 32* 191* 182*  BUN CREATININE 1.63* 2.15* 3.02*  CALCIUM 8.1* 8.2* 8.4   No results found for this basename: AST, ALT, ALKPHOS, BILITOT, PROT, ALBUMIN,  in the last 168 hours  Recent Labs Lab 05/06/14 0932 05/06/14 1627 05/07/14 0550 05/08/14 0732  WBC 6.6 5.7 5.9 7.0  NEUTROABS 4.3  --   --   --   HGB 11.5* 10.9* 12.3 11.0*  HCT 37.3 35.1* 39.4 35.8*  MCV 94.9 93.1 94.5 96.0  PLT 169 164 138* 159   . antiseptic oral rinse  7 mL Mouth Rinse BID  . atorvastatin  40 mg Oral Q2000  . Chlorhexidine Gluconate Cloth  6 each Topical Q0600  . clopidogrel  75 mg Oral Daily  . heparin  5,000 Units  Subcutaneous 3 times per day  . insulin aspart  0-9 Units Subcutaneous TID WC  . ipratropium-albuterol  3 mL Nebulization TID  . mupirocin ointment  1 application Nasal BID  . pantoprazole  40 mg Oral Q1200     acetaminophen, albuterol, feeding supplement (NEPRO CARB STEADY), heparin

## 2014-05-13 LAB — GLUCOSE, CAPILLARY: Glucose-Capillary: 11 mg/dL — CL (ref 70–99)

## 2014-05-13 NOTE — ED Provider Notes (Signed)
Medical screening examination/treatment/procedure(s) were conducted as a shared visit with non-physician practitioner(s) and myself.  I personally evaluated the patient during the encounter  Please see my separate respective documentation pertaining to this patient encounter   Vida Roller, MD 05/13/14 1526

## 2014-05-17 ENCOUNTER — Emergency Department (HOSPITAL_COMMUNITY): Payer: Medicare Other

## 2014-05-17 ENCOUNTER — Observation Stay (HOSPITAL_COMMUNITY)
Admission: EM | Admit: 2014-05-17 | Discharge: 2014-05-19 | Disposition: A | Discharge status: Home / Self Care | Payer: Medicare Other | Attending: Internal Medicine | Admitting: Internal Medicine

## 2014-05-17 ENCOUNTER — Encounter (HOSPITAL_COMMUNITY): Payer: Self-pay | Admitting: Emergency Medicine

## 2014-05-17 DIAGNOSIS — Z882 Allergy status to sulfonamides status: Secondary | ICD-10-CM | POA: Insufficient documentation

## 2014-05-17 DIAGNOSIS — E785 Hyperlipidemia, unspecified: Secondary | ICD-10-CM | POA: Diagnosis not present

## 2014-05-17 DIAGNOSIS — I359 Nonrheumatic aortic valve disorder, unspecified: Secondary | ICD-10-CM | POA: Diagnosis not present

## 2014-05-17 DIAGNOSIS — I498 Other specified cardiac arrhythmias: Secondary | ICD-10-CM | POA: Diagnosis present

## 2014-05-17 DIAGNOSIS — I120 Hypertensive chronic kidney disease with stage 5 chronic kidney disease or end stage renal disease: Secondary | ICD-10-CM | POA: Diagnosis not present

## 2014-05-17 DIAGNOSIS — Z992 Dependence on renal dialysis: Secondary | ICD-10-CM | POA: Diagnosis not present

## 2014-05-17 DIAGNOSIS — I495 Sick sinus syndrome: Principal | ICD-10-CM | POA: Insufficient documentation

## 2014-05-17 DIAGNOSIS — I350 Nonrheumatic aortic (valve) stenosis: Secondary | ICD-10-CM | POA: Diagnosis present

## 2014-05-17 DIAGNOSIS — J90 Pleural effusion, not elsewhere classified: Secondary | ICD-10-CM | POA: Insufficient documentation

## 2014-05-17 DIAGNOSIS — I25118 Atherosclerotic heart disease of native coronary artery with other forms of angina pectoris: Secondary | ICD-10-CM

## 2014-05-17 DIAGNOSIS — I4891 Unspecified atrial fibrillation: Secondary | ICD-10-CM | POA: Insufficient documentation

## 2014-05-17 DIAGNOSIS — N186 End stage renal disease: Secondary | ICD-10-CM | POA: Diagnosis not present

## 2014-05-17 DIAGNOSIS — Z951 Presence of aortocoronary bypass graft: Secondary | ICD-10-CM | POA: Diagnosis not present

## 2014-05-17 DIAGNOSIS — Z7982 Long term (current) use of aspirin: Secondary | ICD-10-CM | POA: Insufficient documentation

## 2014-05-17 DIAGNOSIS — I739 Peripheral vascular disease, unspecified: Secondary | ICD-10-CM | POA: Diagnosis not present

## 2014-05-17 DIAGNOSIS — J449 Chronic obstructive pulmonary disease, unspecified: Secondary | ICD-10-CM | POA: Diagnosis not present

## 2014-05-17 DIAGNOSIS — I517 Cardiomegaly: Secondary | ICD-10-CM | POA: Diagnosis not present

## 2014-05-17 DIAGNOSIS — Z885 Allergy status to narcotic agent status: Secondary | ICD-10-CM | POA: Diagnosis not present

## 2014-05-17 DIAGNOSIS — R001 Bradycardia, unspecified: Secondary | ICD-10-CM

## 2014-05-17 DIAGNOSIS — E119 Type 2 diabetes mellitus without complications: Secondary | ICD-10-CM | POA: Insufficient documentation

## 2014-05-17 DIAGNOSIS — Z79899 Other long term (current) drug therapy: Secondary | ICD-10-CM | POA: Diagnosis not present

## 2014-05-17 DIAGNOSIS — I509 Heart failure, unspecified: Secondary | ICD-10-CM | POA: Insufficient documentation

## 2014-05-17 DIAGNOSIS — I471 Supraventricular tachycardia, unspecified: Secondary | ICD-10-CM | POA: Diagnosis present

## 2014-05-17 DIAGNOSIS — Z87891 Personal history of nicotine dependence: Secondary | ICD-10-CM | POA: Insufficient documentation

## 2014-05-17 DIAGNOSIS — J438 Other emphysema: Secondary | ICD-10-CM

## 2014-05-17 DIAGNOSIS — I480 Paroxysmal atrial fibrillation: Secondary | ICD-10-CM | POA: Diagnosis present

## 2014-05-17 DIAGNOSIS — I2510 Atherosclerotic heart disease of native coronary artery without angina pectoris: Secondary | ICD-10-CM | POA: Diagnosis not present

## 2014-05-17 DIAGNOSIS — J4489 Other specified chronic obstructive pulmonary disease: Secondary | ICD-10-CM | POA: Insufficient documentation

## 2014-05-17 LAB — COMPREHENSIVE METABOLIC PANEL
ALK PHOS: 219 U/L — AB (ref 39–117)
ALT: 21 U/L (ref 0–35)
AST: 30 U/L (ref 0–37)
Albumin: 3.5 g/dL (ref 3.5–5.2)
Anion gap: 16 — ABNORMAL HIGH (ref 5–15)
BUN: 20 mg/dL (ref 6–23)
CHLORIDE: 96 meq/L (ref 96–112)
CO2: 24 meq/L (ref 19–32)
Calcium: 9.3 mg/dL (ref 8.4–10.5)
Creatinine, Ser: 3.87 mg/dL — ABNORMAL HIGH (ref 0.50–1.10)
GFR, EST AFRICAN AMERICAN: 12 mL/min — AB (ref >90)
GFR, EST NON AFRICAN AMERICAN: 11 mL/min — AB (ref >90)
GLUCOSE: 83 mg/dL (ref 70–99)
POTASSIUM: 4.1 meq/L (ref 3.7–5.3)
SODIUM: 136 meq/L — AB (ref 137–147)
Total Bilirubin: 0.3 mg/dL (ref 0.3–1.2)
Total Protein: 7.4 g/dL (ref 6.0–8.3)

## 2014-05-17 LAB — I-STAT CHEM 8, ED
BUN: 22 mg/dL (ref 6–23)
CREATININE: 4.1 mg/dL — AB (ref 0.50–1.10)
Calcium, Ion: 1.15 mmol/L (ref 1.13–1.30)
Chloride: 101 mEq/L (ref 96–112)
GLUCOSE: 89 mg/dL (ref 70–99)
HEMATOCRIT: 44 % (ref 36.0–46.0)
HEMOGLOBIN: 15 g/dL (ref 12.0–15.0)
POTASSIUM: 3.8 meq/L (ref 3.7–5.3)
Sodium: 136 mEq/L — ABNORMAL LOW (ref 137–147)
TCO2: 25 mmol/L (ref 0–100)

## 2014-05-17 LAB — CBC
HCT: 37.5 % (ref 36.0–46.0)
HEMOGLOBIN: 11.4 g/dL — AB (ref 12.0–15.0)
MCH: 29 pg (ref 26.0–34.0)
MCHC: 30.4 g/dL (ref 30.0–36.0)
MCV: 95.4 fL (ref 78.0–100.0)
PLATELETS: 299 10*3/uL (ref 150–400)
RBC: 3.93 MIL/uL (ref 3.87–5.11)
RDW: 21.9 % — ABNORMAL HIGH (ref 11.5–15.5)
WBC: 7.3 10*3/uL (ref 4.0–10.5)

## 2014-05-17 LAB — CBG MONITORING, ED: Glucose-Capillary: 95 mg/dL (ref 70–99)

## 2014-05-17 LAB — MAGNESIUM: Magnesium: 2.1 mg/dL (ref 1.5–2.5)

## 2014-05-17 LAB — TROPONIN I

## 2014-05-17 LAB — I-STAT TROPONIN, ED: TROPONIN I, POC: 0.05 ng/mL (ref 0.00–0.08)

## 2014-05-17 LAB — I-STAT CG4 LACTIC ACID, ED: Lactic Acid, Venous: 1.31 mmol/L (ref 0.5–2.2)

## 2014-05-17 LAB — GLUCOSE, CAPILLARY: GLUCOSE-CAPILLARY: 157 mg/dL — AB (ref 70–99)

## 2014-05-17 MED ORDER — GUAIFENESIN-DM 100-10 MG/5ML PO SYRP: 5.0000 mL | ORAL_SOLUTION | ORAL | Status: DC | PRN

## 2014-05-17 MED ORDER — NITROGLYCERIN 0.4 MG SL SUBL: 0.4000 mg | SUBLINGUAL_TABLET | SUBLINGUAL | Status: DC | PRN

## 2014-05-17 MED ORDER — ASPIRIN 81 MG PO CHEW
81.0000 mg | CHEWABLE_TABLET | Freq: Every day | ORAL | Status: DC
  Administered 2014-05-18 – 2014-05-19 (×2): 81 mg via ORAL
  Filled 2014-05-17: qty 1

## 2014-05-17 MED ORDER — INSULIN ASPART 100 UNIT/ML ~~LOC~~ SOLN
0.0000 [IU] | Freq: Three times a day (TID) | SUBCUTANEOUS | Status: DC
  Administered 2014-05-18: 1 [IU] via SUBCUTANEOUS
  Administered 2014-05-18 – 2014-05-19 (×2): 3 [IU] via SUBCUTANEOUS
  Administered 2014-05-19: 1 [IU] via SUBCUTANEOUS

## 2014-05-17 MED ORDER — AMIODARONE HCL IN DEXTROSE 360-4.14 MG/200ML-% IV SOLN
60.0000 mg/h | Freq: Once | INTRAVENOUS | Status: AC
  Administered 2014-05-17: 60 mg/h via INTRAVENOUS
  Filled 2014-05-17: qty 200

## 2014-05-17 MED ORDER — ONDANSETRON HCL 4 MG PO TABS: 4.0000 mg | ORAL_TABLET | Freq: Four times a day (QID) | ORAL | Status: DC | PRN

## 2014-05-17 MED ORDER — AMIODARONE HCL IN DEXTROSE 360-4.14 MG/200ML-% IV SOLN
INTRAVENOUS | Status: AC
  Filled 2014-05-17: qty 200

## 2014-05-17 MED ORDER — RENA-VITE PO TABS
1.0000 | ORAL_TABLET | Freq: Every day | ORAL | Status: DC
  Administered 2014-05-17 – 2014-05-18 (×2): 1 via ORAL
  Filled 2014-05-17 (×3): qty 1

## 2014-05-17 MED ORDER — ONDANSETRON HCL 4 MG/2ML IJ SOLN: 4.0000 mg | Freq: Four times a day (QID) | INTRAMUSCULAR | Status: DC | PRN

## 2014-05-17 MED ORDER — ADENOSINE 6 MG/2ML IV SOLN
INTRAVENOUS | Status: AC
  Administered 2014-05-17: 6 mg via INTRAVENOUS
  Filled 2014-05-17: qty 6

## 2014-05-17 MED ORDER — ACETAMINOPHEN 325 MG PO TABS
650.0000 mg | ORAL_TABLET | Freq: Four times a day (QID) | ORAL | Status: DC | PRN
  Administered 2014-05-17 – 2014-05-18 (×2): 650 mg via ORAL
  Filled 2014-05-17 (×2): qty 2

## 2014-05-17 MED ORDER — SODIUM CHLORIDE 0.9 % IJ SOLN
3.0000 mL | Freq: Two times a day (BID) | INTRAMUSCULAR | Status: DC
  Administered 2014-05-18: 3 mL via INTRAVENOUS

## 2014-05-17 MED ORDER — BISACODYL 5 MG PO TBEC: 10.0000 mg | DELAYED_RELEASE_TABLET | Freq: Two times a day (BID) | ORAL | Status: DC | PRN

## 2014-05-17 MED ORDER — SENNOSIDES-DOCUSATE SODIUM 8.6-50 MG PO TABS
3.0000 | ORAL_TABLET | Freq: Every evening | ORAL | Status: DC | PRN
  Filled 2014-05-17: qty 3

## 2014-05-17 MED ORDER — ADENOSINE 6 MG/2ML IV SOLN
12.0000 mg | Freq: Once | INTRAVENOUS | Status: AC
  Administered 2014-05-17: 12 mg via INTRAVENOUS

## 2014-05-17 MED ORDER — ADENOSINE 6 MG/2ML IV SOLN
6.0000 mg | Freq: Once | INTRAVENOUS | Status: AC
  Administered 2014-05-17: 6 mg via INTRAVENOUS

## 2014-05-17 MED ORDER — ACETAMINOPHEN 650 MG RE SUPP: 650.0000 mg | Freq: Four times a day (QID) | RECTAL | Status: DC | PRN

## 2014-05-17 MED ORDER — HEPARIN SODIUM (PORCINE) 5000 UNIT/ML IJ SOLN
5000.0000 [IU] | Freq: Three times a day (TID) | INTRAMUSCULAR | Status: DC
  Administered 2014-05-17 – 2014-05-19 (×5): 5000 [IU] via SUBCUTANEOUS
  Filled 2014-05-17 (×7): qty 1

## 2014-05-17 NOTE — H&P (Signed)
[facility] History and Physical  YUI MULVANEY ZOX:[id number] DOB: 01-Sep-1940 DOA: 05/17/2014  Referring physician: EDP PCP: Galvin Proffer, MD   Chief Complaint: heart racing  HPI: [name] is a 73 y.o. female  With h/o coronary artery disease status post coronary artery bypass and graft, severe aortic stenosis, end-stage renal disease, hypertension, hyperlipidemia, diabetes mellitus, paroxysmal Atrial fibrillation, tachybradycardia syndrome, COPD presents to ED after dialysis unit noted HR 150. She did not have dialysis. Recent admission for copd exacerbation, and cardizem and amiodarone were stopped due to bradycardia. After 6, then 12 mg adenosine, converted to sinus rhythm.  Cardiology has seen the patient and recommend IV amiodarone load then convert to PO amiodarone tomorrow if stable.  Patient has no complaints currently. When tachycardic, "felt it in (her) head". No CP, palpitations or dyspnea.   Review of Systems:  Systems reviewed. As above, otherwise negative.  Past Medical History  Diagnosis Date  . CHF (congestive heart failure)     diast SHF, RV failure, pulm HTN  . Diabetes mellitus   . ESRD on hemodialysis     HD MWF in [place]   . Hyperlipidemia   . Anemia   . Hypertension   . COPD (chronic obstructive pulmonary disease)     severe with FEV1 0.64 L  . Peripheral vascular disease   . Coronary artery disease     Hx CABG. Cath 07/06/13 for CP episode showed occlusive graft disease and moderate AS; pt was not a candidate for repeat surgery and a complex PCI/rotablator/stenting procedure of calcified native RCA was done by cardiology  . PAF (paroxysmal atrial fibrillation)     recurrent  . Chronic right-sided heart failure   . Aortic stenosis    Past Surgical History  Procedure Laterality Date  . Coronary artery bypass graft  10/20/2009    x3 by Dr Maren Beach with LIMA to the LAD, a vein to the circumflex and a vien to the PDA.  Marland Kitchen Abdominal hysterectomy     . Thoracentesis Left 12/17/2009  . Av fistula placement  01-12-2008    left upper arm  . Cardiac catheterization  10/15/2009    which showed low normal LV function with mild inferior hypocontractility.  . Thoracentesis Right 10/27/2013   Social History:  reports that she has quit smoking. Her smoking use included Cigarettes. She smoked 0.50 packs per day. She has never used smokeless tobacco. She reports that she does not drink alcohol or use illicit drugs.  Allergies  Allergen Reactions  . Codeine Rash  . Sulfa Antibiotics Other (See Comments)    unknown    Family History  Problem Relation Age of Onset  . Heart disease Mother      Prior to Admission medications   Medication Sig Start Date End Date Taking? Authorizing Provider  aspirin 81 MG chewable tablet Chew 81 mg by mouth daily.    Yes Historical Provider, MD  b complex-vitamin c-folic acid (NEPHRO-VITE) 0.8 MG TABS tablet Take 1 tablet by mouth at bedtime.   Yes Historical Provider, MD  bisacodyl (DULCOLAX) 5 MG EC tablet Take 10 mg by mouth 2 (two) times daily as needed for mild constipation or moderate constipation.   Yes Historical Provider, MD  insulin aspart (NOVOLOG) 100 UNIT/ML injection Inject 2-10 Units into the skin See admin instructions. Sliding scale. 151-200 use 2 units; 201-250 use 4 units; 251-300 use 6 units; 301-350 use 8 units; 351-400 use 10 units   Yes Historical Provider, MD  loratadine (CLARITIN) 10 MG tablet Take 10 mg by mouth daily.   Yes Historical Provider, MD  nitroGLYCERIN (NITROSTAT) 0.4 MG SL tablet Place 0.4 mg under the tongue every 5 (five) minutes as needed for chest pain.    Historical Provider, MD   Physical Exam: Filed Vitals:   05/17/14 1345 05/17/14 1400 05/17/14 1415 05/17/14 1430  BP: 92/63 113/45 115/56 107/45  Pulse: 72 73 72 71  Temp:      TempSrc:      Resp: SpO2: 98% 97% 100% 100%    Wt Readings from Last 3 Encounters:  05/09/14 53.6 kg (118 lb 2.7 oz)    03/26/14 52.889 kg (116 lb 9.6 oz)  03/19/14 51.71 kg (114 lb)   BP 107/45  Pulse 71  Temp(Src) 98.1 F (36.7 C) (Oral)  Resp 21  SpO2 100%  General Appearance:    Alert AA femaliecooperative, no distress, appears stated age  Head:    Normocephalic, without obvious abnormality, atraumatic  Eyes:    PERRL, conjunctiva/corneas clear, EOM's intact  Nose:   Nares normal, septum midline, mucosa normal, no drainage    or sinus tenderness  Throat:   Lips, mucosa, and tongue normal; teeth and gums normal  Neck:   Supple, symmetrical, trachea midline, no adenopathy;    thyroid:  no enlargement/tenderness/nodules; no carotid   bruit or JVD  Back:     Symmetric, no curvature, ROM normal, no CVA tenderness  Lungs:     Clear to auscultation bilaterally, respirations unlabored  Chest Wall:    No tenderness or deformity   Heart:    Regular rate and rhythm, systolic murmur  Abdomen:     Soft, non-tender, bowel sounds active  Genitalia:    deferred  Rectal:    deferred  Extremities:   Extremities normal, atraumatic, no cyanosis or edema  Pulses:   2+ and symmetric all extremities  Skin:   Skin color, texture, turgor normal, no rashes or lesions  Lymph nodes:   Cervical, supraclavicular, and axillary nodes normal  Neurologic:   CNII-XII intact, normal strength, sensation and reflexes    throughout    Psych normal affect           Labs on Admission:  Basic Metabolic Panel:  Recent Labs Lab 05/17/14 0850 05/17/14 0909  NA 136* 136*  K 4.1 3.8  CL 96 101  CO2 24  --   GLUCOSE 83 89  BUN 20 22  CREATININE 3.87* 4.10*  CALCIUM 9.3  --   MG 2.1  --    Liver Function Tests:  Recent Labs Lab 05/17/14 0850  AST 30  ALT 21  ALKPHOS 219*  BILITOT 0.3  PROT 7.4  ALBUMIN 3.5   No results found for this basename: LIPASE, AMYLASE,  in the last 168 hours No results found for this basename: AMMONIA,  in the last 168 hours CBC:  Recent Labs Lab 05/17/14 0850 05/17/14 0909  WBC  7.3  --   HGB 11.4* 15.0  HCT 37.5 44.0  MCV 95.4  --   PLT 299  --    Cardiac Enzymes:  Recent Labs Lab 05/17/14 0857  TROPONINI <0.30    BNP (last 3 results)  Recent Labs  10/22/13 1439  PROBNP >70000.0*   CBG:  Recent Labs Lab 05/17/14 0856  GLUCAP 95    Radiological Exams on Admission: Dg Chest Port 1 View  05/17/2014   CLINICAL DATA:  Tachycardia; renal failure  EXAM: PORTABLE CHEST - 1 VIEW  COMPARISON:  May 07, 2014  FINDINGS: The pleural effusion on the right is slightly smaller compared to recent prior study. There is atelectatic change in the right base. Left lung is clear. Heart is enlarged with pulmonary vascularity within normal limits. There is lower lobe bronchiectatic change, more on the right than on the left. Patient is status post coronary artery bypass grafting. No adenopathy.  IMPRESSION: Right effusion slightly smaller compared to recent prior study. Right base atelectasis present. There is bilateral lower lobe bronchiectatic change, more on the right than on the left. Stable cardiomegaly.   Electronically Signed   By: Bretta Bang M.D.   On: 05/17/2014 09:14    EKG: SVT, left posterior fascicular block, right bundle branch block.  Assessment/Plan Principal Problem:   SVT (supraventricular tachycardia): resolved. Amiodarone per cardiology. obs on tele Active Problems:   COPD (chronic obstructive pulmonary disease) stable   Aortic stenosis, severe, inoperable   CAD- CABG X 3 09/2009, HSRA/DES to native RCA 07/11/13   ESRD (end stage renal disease): consulted nephrology   Paroxysmal atrial fibrillation  Code Status: DNR DVT Prophylaxis: Family Communication: *none available Disposition Plan: observation  Time spent: 60 min  Christiane Ha, MD [facility] Pager [phone]

## 2014-05-17 NOTE — Consult Note (Signed)
Primary cardiologist: TK  HPI: 73 year old female with past medical history of coronary artery disease status post coronary artery bypass and graft, severe aortic stenosis, end-stage renal disease, hypertension, hyperlipidemia, diabetes mellitus, Atrial fibrillation, tachybradycardia syndrome, COPD for evaluation of SVT. Patient's last echocardiogram was performed in February 2015. She was found to have normal LV function, restrictive filling, severe aortic stenosis with a mean gradient of 43 mmHg, mild mitral regurgitation, mildly dilated right ventricle with moderate to severe reduction in function and moderate to severe tricuspid regurgitation. The patient was evaluated by Dr. Roxy Manns and felt not to be a candidate for redo coronary artery bypass graft and also evaluated for TAVR and not felt to be a candidate. Also seen by electrophysiology and felt not to be candidate for further electrophysiology procedures. Patient had PCI of the right coronary artery in November of 2014 with drug-eluting stents. Patient has had difficulties with recurrent atrial arrhythmias. She recently was treated with amiodarone and Cardizem. However on September 7 she presented from dialysis with hypotension and bradycardia. Ultimately her amiodarone and Cardizem were discontinued. She presented to dialysis today and was noted to be tachycardic with a heart rate of 150. She was sent to the emergency room. She denies chest pain, increased dyspnea or palpitations. She has chronic dyspnea on exertion and occasional mild pedal edema. She has not had chest pain.    (Not in a hospital admission)  Allergies  Allergen Reactions  . Codeine Rash  . Sulfa Antibiotics Other (See Comments)    unknown    Past Medical History  Diagnosis Date  . CHF (congestive heart failure)     diast SHF, RV failure, pulm HTN  . Diabetes mellitus   . ESRD on hemodialysis     HD MWF in [place]   . Hyperlipidemia   . Anemia   . Hypertension     . COPD (chronic obstructive pulmonary disease)     severe with FEV1 0.64 L  . Peripheral vascular disease   . Coronary artery disease     Hx CABG. Cath 07/06/13 for CP episode showed occlusive graft disease and moderate AS; pt was not a candidate for repeat surgery and a complex PCI/rotablator/stenting procedure of calcified native RCA was done by cardiology  . PAF (paroxysmal atrial fibrillation)     recurrent  . Chronic right-sided heart failure   . Aortic stenosis     Past Surgical History  Procedure Laterality Date  . Coronary artery bypass graft  10/20/2009    x3 by Dr Darcey Nora with LIMA to the LAD, a vein to the circumflex and a vien to the PDA.  Marland Kitchen Abdominal hysterectomy    . Thoracentesis Left 12/17/2009  . Av fistula placement  01-12-2008    left upper arm  . Cardiac catheterization  10/15/2009    which showed low normal LV function with mild inferior hypocontractility.  . Thoracentesis Right 10/27/2013    History   Social History  . Marital Status: Widowed    Spouse Name: N/A    Number of Children: N/A  . Years of Education: N/A   Occupational History  . Retired    Social History Main Topics  . Smoking status: Former Smoker -- 0.50 packs/day    Types: Cigarettes  . Smokeless tobacco: Never Used  . Alcohol Use: No  . Drug Use: No  . Sexual Activity: No   Other Topics Concern  . Not on file   Social History Narrative   The patient  currently lives at home with a friend "Cristie Hem", who helps her around the house.  The patient manages her own medications, using a pill box.    Family History  Problem Relation Age of Onset  . Heart disease Mother     ROS:  Patient makes little urine; no fevers or chills, productive cough, hemoptysis, dysphasia, odynophagia, melena, hematochezia, dysuria, hematuria, rash, seizure activity, orthopnea, PND, claudication. Remaining systems are negative.  Physical Exam:   Blood pressure 152/95, pulse 138, temperature 98.1 F (36.7  C), temperature source Oral, resp. rate 28, SpO2 97.00%.  General:  Well developed/chronically ill appearing in NAD Skin warm/dry Patient not depressed No peripheral clubbing Back-normal HEENT-normal/normal eyelids Neck supple/normal carotid upstroke bilaterally; no bruits; no JVD; no thyromegaly chest - Bibasilar CV - tachycardic/normal S1; 3/6 systolic murmur LSB Abdomen -NT/ND, no HSM, no mass, + bowel sounds, no bruit 2+ femoral pulses, no bruits Ext-1+ edema, no chords Neuro-grossly nonfocal  ECG SVT, left posterior fascicular block, right bundle branch block.  Results for orders placed during the hospital encounter of 05/17/14 (from the past 48 hour(s))  MAGNESIUM     Status: None   Collection Time    05/17/14  8:50 AM      Result Value Ref Range   Magnesium 2.1  1.5 - 2.5 mg/dL  CBC     Status: Abnormal   Collection Time    05/17/14  8:50 AM      Result Value Ref Range   WBC 7.3  4.0 - 10.5 K/uL   RBC 3.93  3.87 - 5.11 MIL/uL   Hemoglobin 11.4 (*) 12.0 - 15.0 g/dL   Comment: REPEATED TO VERIFY   HCT 37.5  36.0 - 46.0 %   MCV 95.4  78.0 - 100.0 fL   MCH 29.0  26.0 - 34.0 pg   MCHC 30.4  30.0 - 36.0 g/dL   RDW 21.9 (*) 11.5 - 15.5 %   Platelets 299  150 - 400 K/uL  COMPREHENSIVE METABOLIC PANEL     Status: Abnormal   Collection Time    05/17/14  8:50 AM      Result Value Ref Range   Sodium 136 (*) 137 - 147 mEq/L   Potassium 4.1  3.7 - 5.3 mEq/L   Chloride 96  96 - 112 mEq/L   CO2 24  19 - 32 mEq/L   Glucose, Bld 83  70 - 99 mg/dL   BUN 20  6 - 23 mg/dL   Creatinine, Ser 3.87 (*) 0.50 - 1.10 mg/dL   Calcium 9.3  8.4 - 10.5 mg/dL   Total Protein 7.4  6.0 - 8.3 g/dL   Albumin 3.5  3.5 - 5.2 g/dL   AST 30  0 - 37 U/L   ALT 21  0 - 35 U/L   Alkaline Phosphatase 219 (*) 39 - 117 U/L   Total Bilirubin 0.3  0.3 - 1.2 mg/dL   GFR calc non Af Amer 11 (*) >90 mL/min   GFR calc Af Amer 12 (*) >90 mL/min   Comment: (NOTE)     The eGFR has been calculated using the  CKD EPI equation.     This calculation has not been validated in all clinical situations.     eGFR's persistently <90 mL/min signify possible Chronic Kidney     Disease.   Anion gap 16 (*) 5 - 15  CBG MONITORING, ED     Status: None   Collection Time    05/17/14  8:56 AM      Result Value Ref Range   Glucose-Capillary 95  70 - 99 mg/dL   Comment 1 Documented in Chart     Comment 2 Notify RN    TROPONIN I     Status: None   Collection Time    05/17/14  8:57 AM      Result Value Ref Range   Troponin I <0.30  <0.30 ng/mL   Comment:            Due to the release kinetics of cTnI,     a negative result within the first hours     of the onset of symptoms does not rule out     myocardial infarction with certainty.     If myocardial infarction is still suspected,     repeat the test at appropriate intervals.  I-STAT CHEM 8, ED     Status: Abnormal   Collection Time    05/17/14  9:09 AM      Result Value Ref Range   Sodium 136 (*) 137 - 147 mEq/L   Potassium 3.8  3.7 - 5.3 mEq/L   Chloride 101  96 - 112 mEq/L   BUN 22  6 - 23 mg/dL   Creatinine, Ser 4.10 (*) 0.50 - 1.10 mg/dL   Glucose, Bld 89  70 - 99 mg/dL   Calcium, Ion 1.15  1.13 - 1.30 mmol/L   TCO2 25  0 - 100 mmol/L   Hemoglobin 15.0  12.0 - 15.0 g/dL   HCT 44.0  36.0 - 46.0 %  I-STAT CG4 LACTIC ACID, ED     Status: None   Collection Time    05/17/14  9:10 AM      Result Value Ref Range   Lactic Acid, Venous 1.31  0.5 - 2.2 mmol/L  I-STAT TROPOININ, ED     Status: None   Collection Time    05/17/14  9:16 AM      Result Value Ref Range   Troponin i, poc 0.05  0.00 - 0.08 ng/mL   Comment 3            Comment: Due to the release kinetics of cTnI,     a negative result within the first hours     of the onset of symptoms does not rule out     myocardial infarction with certainty.     If myocardial infarction is still suspected,     repeat the test at appropriate intervals.    Dg Chest Port 1 View  05/17/2014    CLINICAL DATA:  Tachycardia; renal failure  EXAM: PORTABLE CHEST - 1 VIEW  COMPARISON:  May 07, 2014  FINDINGS: The pleural effusion on the right is slightly smaller compared to recent prior study. There is atelectatic change in the right base. Left lung is clear. Heart is enlarged with pulmonary vascularity within normal limits. There is lower lobe bronchiectatic change, more on the right than on the left. Patient is status post coronary artery bypass grafting. No adenopathy.  IMPRESSION: Right effusion slightly smaller compared to recent prior study. Right base atelectasis present. There is bilateral lower lobe bronchiectatic change, more on the right than on the left. Stable cardiomegaly.   Electronically Signed   By: Lowella Grip M.D.   On: 05/17/2014 09:14    Assessment/Plan 1 supraventricular tachycardia-the patient presented with asymptomatic SVT. Carotid massage had no effect. She was given 6 mg of adenosine in the emergency room with  no effect. She was subsequently given 12 mg of adenosine and converted to sinus rhythm. Difficult situation. She has had recurrent atrial arrhythmias with tachycardia. However she was recently seen in the emergency room with hypotension and bradycardia. At that time her amiodarone and Cardizem were discontinued. She is felt not to be a candidate for further invasive procedures nor does she want that. We have added IV amiodarone to reload. Hopefully she will tolerate this with no bradycardia since Cardizem was previously discontinued. We convert to by mouth amiodarone tomorrow morning if stable and ultimately discharge on 200 mg of amiodarone by mouth daily. 2 aortic stenosis-severe on most recent echocardiogram. Not a candidate for TAVR or AVR.  3 coronary artery disease-continue aspirin, Plavix and statin. 4 end-stage renal disease-dialysis per nephrology. 5 paroxysmal atrial fibrillation-amiodarone has been resumed as above. Continue aspirin and Plavix.  Apparently not on Coumadin as she requires 1 year of aspirin and Plavix following previous PCI. 6 COPD I also discussed CODE STATUS with patient. She would like to be no CODE BLUE including no intubation, defibrillation or CPR.  Kirk Ruths MD 05/17/2014, 12:45 PM

## 2014-05-17 NOTE — Progress Notes (Signed)
Pt is active for HHC with [facility].  She receives PT,OT, and CNA services.

## 2014-05-17 NOTE — ED Notes (Signed)
Attempted report 

## 2014-05-17 NOTE — ED Notes (Addendum)
Pt comes via [facility] from dialysis center, dialysis refused to stick pt for HR in 140's, pt has DNR on file at dialysis center.

## 2014-05-17 NOTE — ED Notes (Signed)
Nephrology to bedside.  

## 2014-05-17 NOTE — ED Provider Notes (Signed)
CSN: [id number]     Arrival date & time 05/17/14  4098 History   First MD Initiated Contact with Patient 05/17/14 0827     Chief Complaint  Patient presents with  . Tachycardia     HPI Patient presents with concern of fatigue, tachycardia. Patient was going to a scheduled dialysis session today, found to be tachycardic, sent in for evaluation.  She specifically denies pain, acknowledges mild palpitations, but generally complains only of fatigue, unclear time of onset, seems to be chronic. She denies syncope, near syncope, unilateral weakness, change in ongoing respiratory status. Patient was recently stopped from taking amiodarone, beta blocker, for her history of atrial fibrillation.  Past Medical History  Diagnosis Date  . CHF (congestive heart failure)     diast SHF, RV failure, pulm HTN  . Diabetes mellitus   . ESRD on hemodialysis     HD MWF in [place]   . Hyperlipidemia   . Anemia   . Hypertension   . COPD (chronic obstructive pulmonary disease)     severe with FEV1 0.64 L  . Peripheral vascular disease   . Coronary artery disease     Hx CABG. Cath 07/06/13 for CP episode showed occlusive graft disease and moderate AS; pt was not a candidate for repeat surgery and a complex PCI/rotablator/stenting procedure of calcified native RCA was done by cardiology  . PAF (paroxysmal atrial fibrillation)     recurrent  . Chronic right-sided heart failure   . Aortic stenosis    Past Surgical History  Procedure Laterality Date  . Coronary artery bypass graft  10/20/2009    x3 by Dr Maren Beach with LIMA to the LAD, a vein to the circumflex and a vien to the PDA.  Marland Kitchen Abdominal hysterectomy    . Thoracentesis Left 12/17/2009  . Av fistula placement  01-12-2008    left upper arm  . Cardiac catheterization  10/15/2009    which showed low normal LV function with mild inferior hypocontractility.  . Thoracentesis Right 10/27/2013   Family History  Problem Relation Age of Onset  . Heart  disease Mother    History  Substance Use Topics  . Smoking status: Former Smoker -- 0.50 packs/day    Types: Cigarettes    Start date: 06/19/1973  . Smokeless tobacco: Never Used  . Alcohol Use: No   OB History   Grav Para Term Preterm Abortions TAB SAB Ect Mult Living                 Review of Systems  Constitutional:       Per HPI, otherwise negative  HENT:       Per HPI, otherwise negative  Respiratory:       Per HPI, otherwise negative  Cardiovascular:       Per HPI, otherwise negative  Gastrointestinal: Negative for vomiting.  Endocrine:       Negative aside from HPI  Genitourinary:       Neg aside from HPI   Musculoskeletal:       Per HPI, otherwise negative  Skin: Negative.   Neurological: Negative for syncope.      Allergies  Codeine and Sulfa antibiotics  Home Medications   Prior to Admission medications   Medication Sig Start Date End Date Taking? Authorizing Provider  aspirin 81 MG chewable tablet Chew 81 mg by mouth daily.    Yes Historical Provider, MD  b complex-vitamin c-folic acid (NEPHRO-VITE) 0.8 MG TABS tablet Take 1 tablet by mouth  at bedtime.   Yes Historical Provider, MD  bisacodyl (DULCOLAX) 5 MG EC tablet Take 10 mg by mouth 2 (two) times daily as needed for mild constipation or moderate constipation.   Yes Historical Provider, MD  insulin aspart (NOVOLOG) 100 UNIT/ML injection Inject 2-10 Units into the skin See admin instructions. Sliding scale. 151-200 use 2 units; 201-250 use 4 units; 251-300 use 6 units; 301-350 use 8 units; 351-400 use 10 units   Yes Historical Provider, MD  loratadine (CLARITIN) 10 MG tablet Take 10 mg by mouth daily.   Yes Historical Provider, MD  nitroGLYCERIN (NITROSTAT) 0.4 MG SL tablet Place 0.4 mg under the tongue every 5 (five) minutes as needed for chest pain.    Historical Provider, MD   BP 114/69  Pulse 145  Temp(Src) 98.1 F (36.7 C) (Oral)  Resp 21  SpO2 100% Physical Exam  Nursing note and vitals  reviewed. Constitutional: She is oriented to person, place, and time. She appears well-developed and well-nourished. She appears ill. No distress.  HENT:  Head: Normocephalic and atraumatic.  Eyes: Conjunctivae and EOM are normal.  Cardiovascular: Regular rhythm.  Tachycardia present.   Pulmonary/Chest: Effort normal and breath sounds normal. No stridor. No respiratory distress.  Abdominal: She exhibits no distension.  Musculoskeletal: She exhibits no edema.       Arms: Neurological: She is alert and oriented to person, place, and time. No cranial nerve deficit.  Skin: Skin is warm and dry.  Psychiatric: She has a normal mood and affect.    ED Course  Procedures (including critical care time) Labs Review Labs Reviewed  CBC - Abnormal; Notable for the following:    Hemoglobin 11.4 (*)    RDW 21.9 (*)    All other components within normal limits  COMPREHENSIVE METABOLIC PANEL - Abnormal; Notable for the following:    Sodium 136 (*)    Creatinine, Ser 3.87 (*)    Alkaline Phosphatase 219 (*)    GFR calc non Af Amer 11 (*)    GFR calc Af Amer 12 (*)    Anion gap 16 (*)    All other components within normal limits  I-STAT CHEM 8, ED - Abnormal; Notable for the following:    Sodium 136 (*)    Creatinine, Ser 4.10 (*)    All other components within normal limits  MAGNESIUM  TROPONIN I  CBG MONITORING, ED  I-STAT CG4 LACTIC ACID, ED  Rosezena Sensor, ED    Imaging Review Dg Chest Port 1 View  05/17/2014   CLINICAL DATA:  Tachycardia; renal failure  EXAM: PORTABLE CHEST - 1 VIEW  COMPARISON:  May 07, 2014  FINDINGS: The pleural effusion on the right is slightly smaller compared to recent prior study. There is atelectatic change in the right base. Left lung is clear. Heart is enlarged with pulmonary vascularity within normal limits. There is lower lobe bronchiectatic change, more on the right than on the left. Patient is status post coronary artery bypass grafting. No  adenopathy.  IMPRESSION: Right effusion slightly smaller compared to recent prior study. Right base atelectasis present. There is bilateral lower lobe bronchiectatic change, more on the right than on the left. Stable cardiomegaly.   Electronically Signed   By: Bretta Bang M.D.   On: 05/17/2014 09:14     EKG Interpretation   Date/Time:  Friday May 17 2014 08:32:20 EDT Ventricular Rate:  143 PR Interval:    QRS Duration: 151 QT Interval:  385 QTC Calculation: 594  R Axis:   131 Text Interpretation:  Sinus tachycardia RBBB and LPFB ST depr, consider  ischemia, anterolateral lds stach versus aflutter Non-specific  intra-ventricular conduction delay ST-t wave abnormality Abnormal ekg  Confirmed by Gerhard Munch  MD ([id number]) on 05/17/2014 8:43:48 AM     On repeat exam the patient presents in her condition, in no distress, though tachycardic, atrial flutter, versus AFIB.  Update: HR remains elevated, no change in rhythm following initiation of Amiodarone.   12:33 PM Patient awake and alert, cardiology at bedside,  MDM   Patient presents with mild fatigue, but of greater concern is found to have arrhythmia, with rapid ventricular rate. Patient has history of atrial fibrillation, but today's evaluation suggests atrial flutter versus SVT. Given the patient's borderline hypotension, patient was started on amiodarone drip, as she has previously tolerated this medication, seemingly well. With the unstable arrhythmia, ongoing cardiac drip, patient required admission for further evaluation and management. Patient's labs are otherwise somewhat reassuring.  CRITICAL CARE Performed by: Gerhard Munch Total critical care time: 35 Critical care time was exclusive of separately billable procedures and treating other patients. Critical care was necessary to treat or prevent imminent or life-threatening deterioration. Critical care was time spent personally by me on the following  activities: development of treatment plan with patient and/or surrogate as well as nursing, discussions with consultants, evaluation of patient's response to treatment, examination of patient, obtaining history from patient or surrogate, ordering and performing treatments and interventions, ordering and review of laboratory studies, ordering and review of radiographic studies, pulse oximetry and re-evaluation of patient's condition.     Gerhard Munch, MD 05/17/14 1600

## 2014-05-17 NOTE — Progress Notes (Signed)
[facility] Renal Consultation Note  Indication for Consultation:  Management of ESRD/hemodialysis; anemia, hypertension/volume and secondary hyperparathyroidism  HPI: [name] is a 73 y.o. female with a history of diabetes, hypertension, CAD s/p CABG x 3 in 09/2009 and PCI with DES in 06/2013, moderate aortic stenosis, COPD, atrial fibrillation, and ESRD on dialysis at the [facility] who presented for today's treatment with an elevated heart rate in the 140s and was sent to the ED.  She was hospitalized 9/7-10 for acute respiratory failure with hypotension and bradycardia, and on discharge her Amiodarone and Diltiazem were discontinued.  She has chronic dyspnea on exertion and occasional lower extremity edema, but denies any changes associated with her increased heart rate.  Cardiology was consulted and restarted Amiodarone IV, which will be converted to PO when her heart rate is stable.  She prefers to wait until tomorrow for dialysis.  Dialysis Orders:  MWF @ [facility] 4:30     51.5 kg     350/A1.5      3K/2.25Ca     Profile 4      AVF @ LUA      No Heparin No Hectorol         Aranesp 80 mcg & Venofer 50 mg on Wed  Past Medical History  Diagnosis Date  . CHF (congestive heart failure)     diast SHF, RV failure, pulm HTN  . Diabetes mellitus   . ESRD on hemodialysis     HD MWF in [place]   . Hyperlipidemia   . Anemia   . Hypertension   . COPD (chronic obstructive pulmonary disease)     severe with FEV1 0.64 L  . Peripheral vascular disease   . Coronary artery disease     Hx CABG. Cath 07/06/13 for CP episode showed occlusive graft disease and moderate AS; pt was not a candidate for repeat surgery and a complex PCI/rotablator/stenting procedure of calcified native RCA was done by cardiology  . PAF (paroxysmal atrial fibrillation)     recurrent  . Chronic right-sided heart failure   . Aortic stenosis    Past Surgical History  Procedure Laterality Date  .  Coronary artery bypass graft  10/20/2009    x3 by Dr Darcey Nora with LIMA to the LAD, a vein to the circumflex and a vien to the PDA.  Marland Kitchen Abdominal hysterectomy    . Thoracentesis Left 12/17/2009  . Av fistula placement  01-12-2008    left upper arm  . Cardiac catheterization  10/15/2009    which showed low normal LV function with mild inferior hypocontractility.  . Thoracentesis Right 10/27/2013   Family History  Problem Relation Age of Onset  . Heart disease Mother    Social History She previously smoked 0.50 packs of cigarettes per day, but quit years ago. She reports that she does not drink alcohol or use illicit drugs.  She previously worked as a [employer]ar[employer] manager and has lived alone since her husband died.  Allergies  Allergen Reactions  . Codeine Rash  . Sulfa Antibiotics Other (See Comments)    unknown   Prior to Admission medications   Medication Sig Start Date End Date Taking? Authorizing Provider  aspirin 81 MG chewable tablet Chew 81 mg by mouth daily.    Yes Historical Provider, MD  b complex-vitamin c-folic acid (NEPHRO-VITE) 0.8 MG TABS tablet Take 1 tablet by mouth at bedtime.   Yes Historical Provider, MD  bisacodyl (DULCOLAX) 5 MG EC tablet Take 10  mg by mouth 2 (two) times daily as needed for mild constipation or moderate constipation.   Yes Historical Provider, MD  insulin aspart (NOVOLOG) 100 UNIT/ML injection Inject 2-10 Units into the skin See admin instructions. Sliding scale. 151-200 use 2 units; 201-250 use 4 units; 251-300 use 6 units; 301-350 use 8 units; 351-400 use 10 units   Yes Historical Provider, MD  loratadine (CLARITIN) 10 MG tablet Take 10 mg by mouth daily.   Yes Historical Provider, MD  nitroGLYCERIN (NITROSTAT) 0.4 MG SL tablet Place 0.4 mg under the tongue every 5 (five) minutes as needed for chest pain.    Historical Provider, MD   Labs:  Results for orders placed during the hospital encounter of 05/17/14 (from the past 48 hour(s))  MAGNESIUM     Status:  None   Collection Time    05/17/14  8:50 AM      Result Value Ref Range   Magnesium 2.1  1.5 - 2.5 mg/dL  CBC     Status: Abnormal   Collection Time    05/17/14  8:50 AM      Result Value Ref Range   WBC 7.3  4.0 - 10.5 K/uL   RBC 3.93  3.87 - 5.11 MIL/uL   Hemoglobin 11.4 (*) 12.0 - 15.0 g/dL   Comment: REPEATED TO VERIFY   HCT 37.5  36.0 - 46.0 %   MCV 95.4  78.0 - 100.0 fL   MCH 29.0  26.0 - 34.0 pg   MCHC 30.4  30.0 - 36.0 g/dL   RDW 21.9 (*) 11.5 - 15.5 %   Platelets 299  150 - 400 K/uL  COMPREHENSIVE METABOLIC PANEL     Status: Abnormal   Collection Time    05/17/14  8:50 AM      Result Value Ref Range   Sodium 136 (*) 137 - 147 mEq/L   Potassium 4.1  3.7 - 5.3 mEq/L   Chloride 96  96 - 112 mEq/L   CO2 24  19 - 32 mEq/L   Glucose, Bld 83  70 - 99 mg/dL   BUN 20  6 - 23 mg/dL   Creatinine, Ser 3.87 (*) 0.50 - 1.10 mg/dL   Calcium 9.3  8.4 - 10.5 mg/dL   Total Protein 7.4  6.0 - 8.3 g/dL   Albumin 3.5  3.5 - 5.2 g/dL   AST 30  0 - 37 U/L   ALT 21  0 - 35 U/L   Alkaline Phosphatase 219 (*) 39 - 117 U/L   Total Bilirubin 0.3  0.3 - 1.2 mg/dL   GFR calc non Af Amer 11 (*) >90 mL/min   GFR calc Af Amer 12 (*) >90 mL/min   Comment: (NOTE)     The eGFR has been calculated using the CKD EPI equation.     This calculation has not been validated in all clinical situations.     eGFR's persistently <90 mL/min signify possible Chronic Kidney     Disease.   Anion gap 16 (*) 5 - 15  CBG MONITORING, ED     Status: None   Collection Time    05/17/14  8:56 AM      Result Value Ref Range   Glucose-Capillary 95  70 - 99 mg/dL   Comment 1 Documented in Chart     Comment 2 Notify RN    TROPONIN I     Status: None   Collection Time    05/17/14  8:57 AM  Result Value Ref Range   Troponin I <0.30  <0.30 ng/mL   Comment:            Due to the release kinetics of cTnI,     a negative result within the first hours     of the onset of symptoms does not rule out     myocardial  infarction with certainty.     If myocardial infarction is still suspected,     repeat the test at appropriate intervals.  I-STAT CHEM 8, ED     Status: Abnormal   Collection Time    05/17/14  9:09 AM      Result Value Ref Range   Sodium 136 (*) 137 - 147 mEq/L   Potassium 3.8  3.7 - 5.3 mEq/L   Chloride 101  96 - 112 mEq/L   BUN 22  6 - 23 mg/dL   Creatinine, Ser 4.10 (*) 0.50 - 1.10 mg/dL   Glucose, Bld 89  70 - 99 mg/dL   Calcium, Ion 1.15  1.13 - 1.30 mmol/L   TCO2 25  0 - 100 mmol/L   Hemoglobin 15.0  12.0 - 15.0 g/dL   HCT 44.0  36.0 - 46.0 %  I-STAT CG4 LACTIC ACID, ED     Status: None   Collection Time    05/17/14  9:10 AM      Result Value Ref Range   Lactic Acid, Venous 1.31  0.5 - 2.2 mmol/L  I-STAT TROPOININ, ED     Status: None   Collection Time    05/17/14  9:16 AM      Result Value Ref Range   Troponin i, poc 0.05  0.00 - 0.08 ng/mL   Comment 3            Comment: Due to the release kinetics of cTnI,     a negative result within the first hours     of the onset of symptoms does not rule out     myocardial infarction with certainty.     If myocardial infarction is still suspected,     repeat the test at appropriate intervals.   Constitutional: negative for chills, fatigue, fevers and sweats Respiratory: negative for cough, dyspnea on exertion, hemoptysis and sputum Cardiovascular: negative for chest pain, chest pressure/discomfort, dyspnea, orthopnea and palpitations Gastrointestinal: negative for abdominal pain, change in bowel habits, nausea and vomiting Genitourinary:negative, anuric Musculoskeletal:negative for back pain, myalgias and neck pain Neurological: negative for dizziness, gait problems, headaches, speech problems and weakness  Physical Exam: Filed Vitals:   05/17/14 1545  BP: 121/48  Pulse: 72  Temp:   Resp: 22     General appearance: alert, cooperative and no distress Head: Normocephalic, without obvious abnormality, atraumatic Neck:  no adenopathy, no carotid bruit, no JVD and supple, symmetrical, trachea midline Resp: clear to auscultation bilaterally Cardio: RRR with Gr III/VI systolic murmur, no rub GI: soft, non-tender; bowel sounds normal; no masses,  no organomegaly Extremities: trace-1+ pretibial edema Neurologic: Grossly normal Dialysis Access: AVF @ LUA with + bruit   Assessment/Plan: 1. Asymptomatic SVT - @ [facility] 9/7-10 for bradycardia & hypotension, Amiodarone & Diltiazem were dc'd; today initially with HR 150, seen by Cardiology and started IV Amiodarone, which will be converted to PO; HR most recently 72. 2. ESRD - HD on MWF @ [facility], K 3.8.  Pt prefers to have HD tomorrow morning. 3. Hypertension/volume - BP 121/48 on outpatient Metoprolol 25 mg qd; CXR stable, 2.1 L over  EDW. 4. Anemia - Hgb 15, Aranesp & Venofer qwk.  Hold Aranesp. 5. Metabolic bone disease - Ca 9.3 (9.7 corrected), last P 3.4, iPTH 386; no Hectorol or binders. 6. Nutrition - Alb 3.5, renal diet, multivitamin. 7. Hx A-fib - on Plavix, BB, restarted Amiodarone. 8. Hx CAD - s/p CABG x 3 09/2009 & PCI with DES 06/2013. 9. Severe COPD 10. Moderate AS - not a surgical candidate.  LYLES,CHARLES 05/17/2014, 4:13 PM   Attending Nephrologist: Donato Heinz, MD    I have seen and examined this patient and agree with plan as outlined by C. Lyles, PA-C.  [name] wants to wait until tomorrow morning for dialysis due to the lateness of the day.  Stable from pulmonary and electrolyte standpoint to wait until first shift tomorrow and then hopefully stable for discharge. [name] A,MD 05/17/2014 7:50 PM

## 2014-05-17 NOTE — ED Notes (Signed)
Cardiology at bedside.

## 2014-05-17 NOTE — Progress Notes (Signed)
Patient transferred to 2W from ED. IV amiodarone infusing, Tele on and CCMD notified. VSS. Oriented to room and unit. Will continue to monitor.

## 2014-05-17 NOTE — ED Notes (Signed)
Patient CG4 labs given to Dr.Lockwood.

## 2014-05-18 DIAGNOSIS — I359 Nonrheumatic aortic valve disorder, unspecified: Secondary | ICD-10-CM

## 2014-05-18 DIAGNOSIS — I209 Angina pectoris, unspecified: Secondary | ICD-10-CM

## 2014-05-18 DIAGNOSIS — N186 End stage renal disease: Secondary | ICD-10-CM

## 2014-05-18 DIAGNOSIS — Z992 Dependence on renal dialysis: Secondary | ICD-10-CM

## 2014-05-18 DIAGNOSIS — J449 Chronic obstructive pulmonary disease, unspecified: Secondary | ICD-10-CM

## 2014-05-18 LAB — RENAL FUNCTION PANEL
ALBUMIN: 3 g/dL — AB (ref 3.5–5.2)
ANION GAP: 17 — AB (ref 5–15)
ANION GAP: 17 — AB (ref 5–15)
Albumin: 3 g/dL — ABNORMAL LOW (ref 3.5–5.2)
BUN: 30 mg/dL — AB (ref 6–23)
BUN: 28 mg/dL — AB (ref 6–23)
CALCIUM: 8.8 mg/dL (ref 8.4–10.5)
CO2: 24 mEq/L (ref 19–32)
CO2: 23 mEq/L (ref 19–32)
CREATININE: 4.66 mg/dL — AB (ref 0.50–1.10)
Calcium: 9 mg/dL (ref 8.4–10.5)
Chloride: 94 mEq/L — ABNORMAL LOW (ref 96–112)
Chloride: 94 mEq/L — ABNORMAL LOW (ref 96–112)
Creatinine, Ser: 5.04 mg/dL — ABNORMAL HIGH (ref 0.50–1.10)
GFR calc Af Amer: 10 mL/min — ABNORMAL LOW (ref >90)
GFR calc non Af Amer: 9 mL/min — ABNORMAL LOW (ref >90)
GFR, EST AFRICAN AMERICAN: 9 mL/min — AB (ref >90)
GFR, EST NON AFRICAN AMERICAN: 8 mL/min — AB (ref >90)
GLUCOSE: 140 mg/dL — AB (ref 70–99)
Glucose, Bld: 151 mg/dL — ABNORMAL HIGH (ref 70–99)
PHOSPHORUS: 4.4 mg/dL (ref 2.3–4.6)
PHOSPHORUS: 3.6 mg/dL (ref 2.3–4.6)
POTASSIUM: 4.1 meq/L (ref 3.7–5.3)
Potassium: 4.1 mEq/L (ref 3.7–5.3)
SODIUM: 134 meq/L — AB (ref 137–147)
Sodium: 135 mEq/L — ABNORMAL LOW (ref 137–147)

## 2014-05-18 LAB — CBC
HCT: 33.2 % — ABNORMAL LOW (ref 36.0–46.0)
HCT: 33.4 % — ABNORMAL LOW (ref 36.0–46.0)
HEMOGLOBIN: 10.4 g/dL — AB (ref 12.0–15.0)
Hemoglobin: 10.5 g/dL — ABNORMAL LOW (ref 12.0–15.0)
MCH: 28.6 pg (ref 26.0–34.0)
MCH: 29.1 pg (ref 26.0–34.0)
MCHC: 31.1 g/dL (ref 30.0–36.0)
MCHC: 31.6 g/dL (ref 30.0–36.0)
MCV: 92 fL (ref 78.0–100.0)
MCV: 91.8 fL (ref 78.0–100.0)
Platelets: 275 10*3/uL (ref 150–400)
Platelets: 288 10*3/uL (ref 150–400)
RBC: 3.61 MIL/uL — ABNORMAL LOW (ref 3.87–5.11)
RBC: 3.64 MIL/uL — ABNORMAL LOW (ref 3.87–5.11)
RDW: 21.7 % — AB (ref 11.5–15.5)
RDW: 21.8 % — ABNORMAL HIGH (ref 11.5–15.5)
WBC: 5.9 10*3/uL (ref 4.0–10.5)
WBC: 7 10*3/uL (ref 4.0–10.5)

## 2014-05-18 LAB — GLUCOSE, CAPILLARY
GLUCOSE-CAPILLARY: 140 mg/dL — AB (ref 70–99)
Glucose-Capillary: 210 mg/dL — ABNORMAL HIGH (ref 70–99)
Glucose-Capillary: 160 mg/dL — ABNORMAL HIGH (ref 70–99)

## 2014-05-18 MED ORDER — SODIUM CHLORIDE 0.9 % IV SOLN: 100.0000 mL | INTRAVENOUS | Status: DC | PRN

## 2014-05-18 MED ORDER — PENTAFLUOROPROP-TETRAFLUOROETH EX AERO: 1.0000 "application " | INHALATION_SPRAY | CUTANEOUS | Status: DC | PRN

## 2014-05-18 MED ORDER — AMIODARONE HCL 200 MG PO TABS: 200.0000 mg | ORAL_TABLET | Freq: Every day | ORAL | Status: DC

## 2014-05-18 MED ORDER — AMIODARONE HCL 200 MG PO TABS
200.0000 mg | ORAL_TABLET | Freq: Every day | ORAL | Status: DC
  Filled 2014-05-18: qty 1

## 2014-05-18 MED ORDER — NEPRO/CARBSTEADY PO LIQD: 237.0000 mL | ORAL | Status: DC | PRN

## 2014-05-18 MED ORDER — AMIODARONE HCL 200 MG PO TABS: 400.0000 mg | ORAL_TABLET | Freq: Two times a day (BID) | ORAL | Status: DC

## 2014-05-18 MED ORDER — AMIODARONE HCL 200 MG PO TABS
400.0000 mg | ORAL_TABLET | Freq: Once | ORAL | Status: AC
  Administered 2014-05-18: 400 mg via ORAL
  Filled 2014-05-18: qty 2

## 2014-05-18 MED ORDER — ALTEPLASE 2 MG IJ SOLR
2.0000 mg | Freq: Once | INTRAMUSCULAR | Status: DC | PRN
  Filled 2014-05-18: qty 2

## 2014-05-18 MED ORDER — LIDOCAINE HCL (PF) 1 % IJ SOLN
5.0000 mL | INTRAMUSCULAR | Status: DC | PRN
Start: 2014-05-18 — End: 2014-05-18

## 2014-05-18 MED ORDER — LIDOCAINE-PRILOCAINE 2.5-2.5 % EX CREA: 1.0000 "application " | TOPICAL_CREAM | CUTANEOUS | Status: DC | PRN

## 2014-05-18 MED ORDER — HEPARIN SODIUM (PORCINE) 1000 UNIT/ML DIALYSIS: 1000.0000 [IU] | INTRAMUSCULAR | Status: DC | PRN

## 2014-05-18 NOTE — Progress Notes (Signed)
Utilization Review Completed.[name] T9/19/2015  

## 2014-05-18 NOTE — Progress Notes (Signed)
Consulting cardiologist: Olga Millers MD Primary Cardiologist: Nicki Guadalajara MD  Subjective:    Patient currently in dialysis with episode of SVT lasting approx one hour while connected to kidney. Dialysis was stopped and she returned to NSR. Patient was symptomatic only for feeling her heart racing and some pressure.   Objective:   Temp:  [97.4 F (36.3 C)-98 F (36.7 C)] 97.4 F (36.3 C) (09/19 0712) Pulse Rate:  [60-143] 65 (09/19 1021) Resp:  [12-30] 21 (09/19 1021) BP: (92-152)/(43-100) 97/48 mmHg (09/19 1021) SpO2:  [95 %-100 %] 100 % (09/19 0712) Weight:  [118 lb 12.8 oz (53.887 kg)-119 lb 7.8 oz (54.2 kg)] 119 lb 7.8 oz (54.2 kg) (09/19 0712) Last BM Date: 05/17/14  Filed Weights   05/17/14 2017 05/18/14 0702 05/18/14 1610  Weight: 118 lb 12.8 oz (53.887 kg) 118 lb 12.8 oz (53.887 kg) 119 lb 7.8 oz (54.2 kg)   No intake or output data in the 24 hours ending 05/18/14 1037  Telemetry:  Exam:  General: No acute distress.Currently undergoing dialysis.   HEENT: Conjunctiva and lids normal, oropharynx clear.  Lungs:Inspiratory crackles without wheezes.  Cardiac: No elevated JVP or bruits. RRR, 2/6 systolic murmur with preserved S2.   Abdomen: Normoactive bowel sounds, nontender, nondistended.  Extremities: No pitting edema, distal pulses full.  Neuropsychiatric: Alert and oriented x3, affect appropriate.   Lab Results:  Basic Metabolic Panel:  Recent Labs Lab 05/17/14 0850 05/17/14 0909 05/18/14 0031 05/18/14 0717  NA 136* 136* 135* 134*  K 4.1 3.8 4.1 4.1  CL 96 101 94* 94*  CO2 24  --  24 23  GLUCOSE 83 89 140* 151*  BUN 20 22 28* 30*  CREATININE 3.87* 4.10* 4.66* 5.04*  CALCIUM 9.3  --  8.8 9.0  MG 2.1  --   --   --     Liver Function Tests:  Recent Labs Lab 05/17/14 0850 05/18/14 0031 05/18/14 0717  AST 30  --   --   ALT 21  --   --   ALKPHOS 219*  --   --   BILITOT 0.3  --   --   PROT 7.4  --   --   ALBUMIN 3.5 3.0* 3.0*     CBC:  Recent Labs Lab 05/17/14 0850 05/17/14 0909 05/18/14 0031 05/18/14 0717  WBC 7.3  --  7.0 5.9  HGB 11.4* 15.0 10.4* 10.5*  HCT 37.5 44.0 33.4* 33.2*  MCV 95.4  --  91.8 92.0  PLT 299  --  288 275    Cardiac Enzymes:  Recent Labs Lab 05/17/14 0857  TROPONINI <0.30    BNP:  Recent Labs  10/22/13 1439  PROBNP >70000.0*   Radiology: Dg Chest Port 1 View  05/17/2014   CLINICAL DATA:  Tachycardia; renal failure  EXAM: PORTABLE CHEST - 1 VIEW  COMPARISON:  May 07, 2014  FINDINGS: The pleural effusion on the right is slightly smaller compared to recent prior study. There is atelectatic change in the right base. Left lung is clear. Heart is enlarged with pulmonary vascularity within normal limits. There is lower lobe bronchiectatic change, more on the right than on the left. Patient is status post coronary artery bypass grafting. No adenopathy.  IMPRESSION: Right effusion slightly smaller compared to recent prior study. Right base atelectasis present. There is bilateral lower lobe bronchiectatic change, more on the right than on the left. Stable cardiomegaly.   Electronically Signed   By: Bretta Bang M.D.  On: 05/17/2014 09:14      Medications:   Scheduled Medications: . aspirin  81 mg Oral Daily  . heparin  5,000 Units Subcutaneous 3 times per day  . insulin aspart  0-9 Units Subcutaneous TID WC  . multivitamin  1 tablet Oral QHS  . sodium chloride  3 mL Intravenous Q12H     Infusions:     PRN Medications:  sodium chloride, sodium chloride, acetaminophen, acetaminophen, alteplase, bisacodyl, feeding supplement (NEPRO CARB STEADY), guaiFENesin-dextromethorphan, heparin, lidocaine (PF), lidocaine-prilocaine, nitroGLYCERIN, ondansetron (ZOFRAN) IV, ondansetron, pentafluoroprop-tetrafluoroeth, senna-docusate   Assessment and Plan:   1.Recurrent SVT: She has had a history of same but was taken off of amiodarone and diltiazem int he setting of  bradycardia and hypotension during recent admission 05/06/2014. Was seen by Dr.Crenshaw yesterday for recurrent SVT and was treated with Adenosine 12 mg with conversion to NSR. She was reloaded yesterday with IV amiodarone, but this was not infusing during dialysis. Plan was to transition to po amiodarone this am.   She has another episode of witnessed SVT lasting approx one hour during dialysis this am, rates up to 130 bpm, with minimal symptoms with spontaneous conversion to NSR when she dialysis was temporarily stopped. Dialysis has been resumed and she is currently in NSR.   Will begin Amiodarone 400 mg po now X 1 diose, and follow up with 200 mg daily or BID once further assessed by rounding cardiologist with plan to continue on discharge .Keep on telemetry.  She is currently in NSR and stable. Will not restart diltiazem due to issues with hypotension. Consider EP evaluation.   2. Aortic Valve Stenosis: Severe per echo 10/06/2013, Valve area: 0.45cm^2(VTI). Valve area: 0.45cm^2 (Vmax).She is not found to be a surgical or TAVR candidate.   3. CAD: . PCI of RCA in 2014, with DES, with hx of CABG in 2014. Continue ASA, Plavix She is not on a statin currently.   4. Atrial fibrillation: Not on coumadin currently. Per note this is because of DAPT.   5. COPD:     Bettey Mare. Lawrence NP  05/18/2014, 10:37 AM  I have seen and examined the patient along with Jodelle Gross, NP. I have reviewed the chart, notes and new data. I agree with NP's note.  Key new complaints: recurrent SVT during HD - palpitations and mild chest pressure, resolved when arrhythmia resolved  Key examination changes: 3/6 mid peaking AS murmur  PLAN:  Keep on higher dose of amiodarone, as much as tolerated by sinus rate.  Focus is on symptom control, as she is not a candidate for AVR.  Thurmon Fair, MD, [facility]  [facility]  [phone]  05/18/2014, 12:48 PM

## 2014-05-18 NOTE — Progress Notes (Signed)
[facility] PROGRESS NOTE  [name] ZOX:[id number] DOB: [birth year] DOA: 05/17/2014 PCP: Galvin Proffer, MD Interim summary: [name] is a 73 y.o. female  With h/o coronary artery disease status post coronary artery bypass and graft, severe aortic stenosis, end-stage renal disease, hypertension, hyperlipidemia, diabetes mellitus, paroxysmal Atrial fibrillation, tachybradycardia syndrome, COPD presents to ED after dialysis unit noted HR 150. She was found to be in SVT, was loaded with amiodarone and admitted to medical service and cardiology consulted.   Assessment/Plan: 1. Recurrent SVT: Loaded with IV amiodarone yesterday,later converted to po amiodarone. cardizemon hold as she developed hypotension with it.  Currently asymptomatic.  Watch her for 24 hours and plan for disposition.   2. CAD: . PCI of RCA in 2014, with DES, with hx of CABG in 2014. Continue ASA, Plavix She is not on a statin currently.  3.. Atrial fibrillation: Not on coumadin currently. Per note this is because of DAPT.  4 COPD: not wheezing , stable.  5. ESRD on HD RENAL consulted and she underwent HD today.  6. Severe aortic stenosis: not a candidate for surgery, medical management.    Code Status: DNR Family Communication: none at bedside Disposition Plan: pending.    Consultants:  CARDIOLOGY  Renal.  Procedures:  HD  Antibiotics:  none  HPI/Subjective: Wanted to know when she cango home.denies any chest pain, sob, or cough. Just had lunch.   Objective: Filed Vitals:   05/18/14 1235  BP: 108/48  Pulse: 62  Temp: 97.9 F (36.6 C)  Resp: 24    Intake/Output Summary (Last 24 hours) at 05/18/14 1323 Last data filed at 05/18/14 1235  Gross per 24 hour  Intake      0 ml  Output    329 ml  Net   -329 ml   Filed Weights   05/18/14 0702 05/18/14 0712 05/18/14 1235  Weight: 53.887 kg (118 lb 12.8 oz) 54.2 kg (119 lb 7.8 oz) 53.9 kg (118 lb 13.3 oz)    Exam:   General:  Alert  afebrile comfortable  Cardiovascular: s1s2,  AS murmer present  Respiratory: good air entry bilateral , no wheezing or rhonchi  Abdomen: soft non tender non distended bowel sounds heard  Musculoskeletal: no pedal edema, cyanosis or clubbing.   Data Reviewed: Basic Metabolic Panel:  Recent Labs Lab 05/17/14 0850 05/17/14 0909 05/18/14 0031 05/18/14 0717  NA 136* 136* 135* 134*  K 4.1 3.8 4.1 4.1  CL 96 101 94* 94*  CO2 24  --  24 23  GLUCOSE 83 89 140* 151*  BUN 20 22 28* 30*  CREATININE 3.87* 4.10* 4.66* 5.04*  CALCIUM 9.3  --  8.8 9.0  MG 2.1  --   --   --   PHOS  --   --  3.6 4.4   Liver Function Tests:  Recent Labs Lab 05/17/14 0850 05/18/14 0031 05/18/14 0717  AST 30  --   --   ALT 21  --   --   ALKPHOS 219*  --   --   BILITOT 0.3  --   --   PROT 7.4  --   --   ALBUMIN 3.5 3.0* 3.0*   No results found for this basename: LIPASE, AMYLASE,  in the last 168 hours No results found for this basename: AMMONIA,  in the last 168 hours CBC:  Recent Labs Lab 05/17/14 0850 05/17/14 0909 05/18/14 0031 05/18/14 0717  WBC 7.3  --  7.0 5.9  HGB 11.4* 15.0 10.4* 10.5*  HCT 37.5 44.0 33.4* 33.2*  MCV 95.4  --  91.8 92.0  PLT 299  --  288 275   Cardiac Enzymes:  Recent Labs Lab 05/17/14 0857  TROPONINI <0.30   BNP (last 3 results)  Recent Labs  10/22/13 1439  PROBNP >70000.0*   CBG:  Recent Labs Lab 05/17/14 0856 05/17/14 2144 05/18/14 0653  GLUCAP 95 157* 140*    No results found for this or any previous visit (from the past 240 hour(s)).   Studies: Dg Chest Port 1 View  05/17/2014   CLINICAL DATA:  Tachycardia; renal failure  EXAM: PORTABLE CHEST - 1 VIEW  COMPARISON:  May 07, 2014  FINDINGS: The pleural effusion on the right is slightly smaller compared to recent prior study. There is atelectatic change in the right base. Left lung is clear. Heart is enlarged with pulmonary vascularity within normal limits. There is lower lobe  bronchiectatic change, more on the right than on the left. Patient is status post coronary artery bypass grafting. No adenopathy.  IMPRESSION: Right effusion slightly smaller compared to recent prior study. Right base atelectasis present. There is bilateral lower lobe bronchiectatic change, more on the right than on the left. Stable cardiomegaly.   Electronically Signed   By: Bretta Bang M.D.   On: 05/17/2014 09:14    Scheduled Meds: . [START ON 05/19/2014] amiodarone  200 mg Oral Daily  . aspirin  81 mg Oral Daily  . heparin  5,000 Units Subcutaneous 3 times per day  . insulin aspart  0-9 Units Subcutaneous TID WC  . multivitamin  1 tablet Oral QHS  . sodium chloride  3 mL Intravenous Q12H   Continuous Infusions:   Principal Problem:   SVT (supraventricular tachycardia) Active Problems:   COPD (chronic obstructive pulmonary disease)   Aortic stenosis, severe   CAD- CABG X 3 09/2009, HSRA/DES to native RCA 07/11/13   ESRD (end stage renal disease)   Paroxysmal atrial fibrillation    Time spent: 30  Minutes.     [facility]  [facility] Pager [phone] If 7PM-7AM, please contact night-coverage at [email or web address], password [facility] 05/18/2014, 1:23 PM  LOS: 1 day

## 2014-05-18 NOTE — Progress Notes (Signed)
I have seen and examined the patient along with Jodelle Gross, NP.  I have reviewed the chart, notes and new data.  I agree with NP's note.  Key new complaints: recurrent SVT during HD - palpitations and mild chest pressure, resolved when arrhythmia resolved Key examination changes: 3/6 mid peaking AS murmur  PLAN: Keep on higher dose of amiodarone, as much as tolerated by sinus rate. Focus is on symptom control, as she is not a candidate for AVR.  Thurmon Fair, MD, [facility] [phone] 05/18/2014, 12:48 PM

## 2014-05-18 NOTE — Progress Notes (Signed)
Subjective:  Seen on dialysis, 1 hour into treatment her HR increased from the 60s to 120-130, reported palpitations, but now asymptomatic with HR 127  Objective: Vital signs in last 24 hours: Temp:  [97.4 F (36.3 C)-98 F (36.7 C)] 97.4 F (36.3 C) (09/19 0712) Pulse Rate:  [60-145] 128 (09/19 0941) Resp:  [12-30] 20 (09/19 0941) BP: (92-152)/(43-100) 95/58 mmHg (09/19 0941) SpO2:  [95 %-100 %] 100 % (09/19 0712) Weight:  [53.887 kg (118 lb 12.8 oz)-54.2 kg (119 lb 7.8 oz)] 54.2 kg (119 lb 7.8 oz) (09/19 0712) Weight change:   Intake/Output from previous day:   Intake/Output this shift:   Lab Results:  Recent Labs  05/18/14 0031 05/18/14 0717  WBC 7.0 5.9  HGB 10.4* 10.5*  HCT 33.4* 33.2*  PLT 288 275   BMET:  Recent Labs  05/18/14 0031 05/18/14 0717  NA 135* 134*  K 4.1 4.1  CL 94* 94*  CO2 24 23  GLUCOSE 140* 151*  BUN 28* 30*  CREATININE 4.66* 5.04*  CALCIUM 8.8 9.0  ALBUMIN 3.0* 3.0*   No results found for this basename: PTH,  in the last 72 hours Iron Studies: No results found for this basename: IRON, TIBC, TRANSFERRIN, FERRITIN,  in the last 72 hours  Studies/Results: Dg Chest Port 1 View  05/17/2014   CLINICAL DATA:  Tachycardia; renal failure  EXAM: PORTABLE CHEST - 1 VIEW  COMPARISON:  May 07, 2014  FINDINGS: The pleural effusion on the right is slightly smaller compared to recent prior study. There is atelectatic change in the right base. Left lung is clear. Heart is enlarged with pulmonary vascularity within normal limits. There is lower lobe bronchiectatic change, more on the right than on the left. Patient is status post coronary artery bypass grafting. No adenopathy.  IMPRESSION: Right effusion slightly smaller compared to recent prior study. Right base atelectasis present. There is bilateral lower lobe bronchiectatic change, more on the right than on the left. Stable cardiomegaly.   Electronically Signed   By: Bretta Bang M.D.   On:  05/17/2014 09:14   EXAM: General appearance:  Alert, in no apparent distress Resp:  CTA without rales, rhonchi, or wheezes Cardio:  Tachycardic, regular rhythm, Gr III/VI systolic murmur, no rub GI: + BS, soft and nontender Extremities:  Trace-1+ pretibial edema Access:  AVF @ LUA with BFR 350 cc/min  Dialysis Orders: MWF @ [facility]  4:30 51.5 kg 350/A1.5 3K/2.25Ca Profile 4 AVF @ LUA No Heparin  No Hectorol Aranesp 80 mcg & Venofer 50 mg on Wed  Assessment/Plan: 1. SVT - @ [facility] 9/7-10 for bradycardia & hypotension, Amiodarone & Diltiazem were dc'd; initially with HR 140s yesterday, improved with Adenosine & IV Amiodarone, which will be converted to PO, but HR increased during HD today, no improvement with UF off.  Cardiology was contacted and will see. 2. ESRD - HD on MWF @ [facility], K 4.1. HD today.  3. HTN/volume - BP 103/55 on outpatient Metoprolol 25 mg qd; CXR stable, 2.7 L over EDW, but UF now off sec to elevated HR. 4. Anemia - Hgb 10.5, Aranesp & Venofer qwk.  5. Metabolic bone disease - Ca 9 (9.8 corrected), P 4.4, iPTH 386; no Hectorol or binders.  6. Nutrition - Alb 3, renal diet, multivitamin.  7. Hx A-fib - on Plavix, BB, restarted Amiodarone.  8. Hx CAD - s/p CABG x 3 09/2009 & PCI with DES 06/2013.  9. Severe COPD  10. Moderate AS - not  a surgical candidate     LOS: 1 day   LYLES,CHARLES 05/18/2014,9:44 AM   I have seen and examined this patient and agree with plan as outlined by C. Lyles, PA-C.  Amiodarone therapy to transition to po per Cardiology. Jinger Middlesworth A,MD 05/18/2014 1:47 PM

## 2014-05-19 LAB — GLUCOSE, CAPILLARY
GLUCOSE-CAPILLARY: 142 mg/dL — AB (ref 70–99)
Glucose-Capillary: 128 mg/dL — ABNORMAL HIGH (ref 70–99)
Glucose-Capillary: 241 mg/dL — ABNORMAL HIGH (ref 70–99)

## 2014-05-19 MED ORDER — AMIODARONE HCL 400 MG PO TABS: 400.0000 mg | ORAL_TABLET | Freq: Every day | ORAL | Status: DC

## 2014-05-19 MED ORDER — AMIODARONE HCL 200 MG PO TABS
400.0000 mg | ORAL_TABLET | Freq: Every day | ORAL | Status: DC
  Administered 2014-05-19: 400 mg via ORAL
  Filled 2014-05-19: qty 2

## 2014-05-19 MED ORDER — GUAIFENESIN-DM 100-10 MG/5ML PO SYRP: 5.0000 mL | ORAL_SOLUTION | Freq: Four times a day (QID) | ORAL | Status: DC | PRN

## 2014-05-19 NOTE — Discharge Summary (Signed)
Physician Discharge Summary  [name]:[id number] DOB: 12-May-1941 DOA: 05/17/2014  PCP: Galvin Proffer, MD  Admit date: 05/17/2014 Discharge date: 05/19/2014  Time spent: 30 minutes  Recommendations for Outpatient Follow-up:  1. Follow with PCP in one week 2. follow up cardiology as recommended in 2 weeks.    Discharge Diagnoses:  Principal Problem:   SVT (supraventricular tachycardia) Active Problems:   COPD (chronic obstructive pulmonary disease)   Aortic stenosis, severe   CAD- CABG X 3 09/2009, HSRA/DES to native RCA 07/11/13   ESRD (end stage renal disease)   Paroxysmal atrial fibrillation   Discharge Condition: improved.   Diet recommendation: low sodium renal diet  Filed Weights   05/18/14 0712 05/18/14 1235 05/19/14 0506  Weight: 54.2 kg (119 lb 7.8 oz) 53.9 kg (118 lb 13.3 oz) 53.9 kg (118 lb 13.3 oz)    History of present illness:  [name] is a 73 y.o. female  With h/o coronary artery disease status post coronary artery bypass and graft, severe aortic stenosis, end-stage renal disease, hypertension, hyperlipidemia, diabetes mellitus, paroxysmal Atrial fibrillation, tachybradycardia syndrome, COPD presents to ED after dialysis unit noted HR 150. She was found to be in SVT, was loaded with amiodarone and admitted to medical service and cardiology consulted.    Hospital Course:  1. Recurrent SVT: Loaded with IV amiodarone on 9/18, later converted to po amiodarone. cardizem on hold as she developed hypotension with it.  Currently asymptomatic. Worked with PT AND HR remained good. Discharge on po amiodarone and follow up with cardiology in 2 weeks.  2. CAD: . PCI of RCA in 2014, with DES, with hx of CABG in 2014. Continue ASA, Plavix She is not on a statin currently.  3.. Atrial fibrillation: Not on coumadin currently. Per note this is because of DAPT.  4 COPD: not wheezing , stable.  5. ESRD on HD RENAL consulted and she underwent HD yesterday..  6. Severe  aortic stenosis: not a candidate for surgery, medical management.      Procedures:  HD  Consultations:  Cardiology   Renal.   Discharge Exam: Filed Vitals:   05/19/14 0506  BP: 128/42  Pulse: 64  Temp: 98 F (36.7 C)  Resp: 20    General: alert afebrile comfortable Cardiovascular: s1s2 Respiratory: ctab  Discharge Instructions You were cared for by a hospitalist during your hospital stay. If you have any questions about your discharge medications or the care you received while you were in the hospital after you are discharged, you can call the unit and asked to speak with the hospitalist on call if the hospitalist that took care of you is not available. Once you are discharged, your primary care physician will handle any further medical issues. Please note that NO REFILLS for any discharge medications will be authorized once you are discharged, as it is imperative that you return to your primary care physician (or establish a relationship with a primary care physician if you do not have one) for your aftercare needs so that they can reassess your need for medications and monitor your lab values.  Discharge Instructions   Discharge instructions    Complete by:  As directed   Follow up with PCP in one week          Current Discharge Medication List    START taking these medications   Details  amiodarone (PACERONE) 400 MG tablet Take 1 tablet (400 mg total) by mouth daily. Qty: 30 tablet, Refills:  1    guaiFENesin-dextromethorphan (ROBITUSSIN DM) 100-10 MG/5ML syrup Take 5 mLs by mouth every 6 (six) hours as needed for cough. Qty: 118 mL, Refills: 0      CONTINUE these medications which have NOT CHANGED   Details  aspirin 81 MG chewable tablet Chew 81 mg by mouth daily.     b complex-vitamin c-folic acid (NEPHRO-VITE) 0.8 MG TABS tablet Take 1 tablet by mouth at bedtime.    bisacodyl (DULCOLAX) 5 MG EC tablet Take 10 mg by mouth 2 (two) times daily as needed for  mild constipation or moderate constipation.    insulin aspart (NOVOLOG) 100 UNIT/ML injection Inject 2-10 Units into the skin See admin instructions. Sliding scale. 151-200 use 2 units; 201-250 use 4 units; 251-300 use 6 units; 301-350 use 8 units; 351-400 use 10 units    loratadine (CLARITIN) 10 MG tablet Take 10 mg by mouth daily.    nitroGLYCERIN (NITROSTAT) 0.4 MG SL tablet Place 0.4 mg under the tongue every 5 (five) minutes as needed for chest pain.       Allergies  Allergen Reactions  . Codeine Rash  . Sulfa Antibiotics Other (See Comments)    unknown   Follow-up Information   Follow up with HAGUE, Myrene Galas, MD. Schedule an appointment as soon as possible for a visit in 1 week.   Specialty:  Internal Medicine   Contact information:   [address] [place] [address] [phone]        The results of significant diagnostics from this hospitalization (including imaging, microbiology, ancillary and laboratory) are listed below for reference.    Significant Diagnostic Studies: Dg Chest 2 View  05/06/2014   CLINICAL DATA:  Dizziness during dialysis.  EXAM: CHEST  2 VIEW  COMPARISON:  01/11/2014  FINDINGS: Moderate to large right pleural effusion. Enlarged cardiopericardial silhouette. Prior CABG. Cephalization of blood flow with bilateral interstitial edema.  Coronary artery stents are also noted.  IMPRESSION: 1. Cardiomegaly with interstitial edema. 2. Large right pleural effusion, similar magnitude to prior, with passive atelectasis.   Electronically Signed   By: Herbie Baltimore M.D.   On: 05/06/2014 10:44   Dg Chest Port 1 View  05/17/2014   CLINICAL DATA:  Tachycardia; renal failure  EXAM: PORTABLE CHEST - 1 VIEW  COMPARISON:  May 07, 2014  FINDINGS: The pleural effusion on the right is slightly smaller compared to recent prior study. There is atelectatic change in the right base. Left lung is clear. Heart is enlarged with pulmonary vascularity within normal  limits. There is lower lobe bronchiectatic change, more on the right than on the left. Patient is status post coronary artery bypass grafting. No adenopathy.  IMPRESSION: Right effusion slightly smaller compared to recent prior study. Right base atelectasis present. There is bilateral lower lobe bronchiectatic change, more on the right than on the left. Stable cardiomegaly.   Electronically Signed   By: Bretta Bang M.D.   On: 05/17/2014 09:14   Dg Chest Port 1 View  05/07/2014   CLINICAL DATA:  pleural effusion  EXAM: PORTABLE CHEST - 1 VIEW  COMPARISON:  the previous day's study  FINDINGS: Previous CABG. Stable cardiomegaly. Layering moderate right pleural effusion with consolidation/atelectasis at the right lung base, not convincingly changed in size given differences in patient positioning. Mild central pulmonary vascular congestion suspected. Left lung clear.  IMPRESSION: 1. Stable cardiomegaly and moderate right pleural effusion.   Electronically Signed   By: Kerry Kass.D.  On: 05/07/2014 07:52    Microbiology: No results found for this or any previous visit (from the past 240 hour(s)).   Labs: Basic Metabolic Panel:  Recent Labs Lab 05/17/14 0850 05/17/14 0909 05/18/14 0031 05/18/14 0717  NA 136* 136* 135* 134*  K 4.1 3.8 4.1 4.1  CL 96 101 94* 94*  CO2 24  --  24 23  GLUCOSE 83 89 140* 151*  BUN 20 22 28* 30*  CREATININE 3.87* 4.10* 4.66* 5.04*  CALCIUM 9.3  --  8.8 9.0  MG 2.1  --   --   --   PHOS  --   --  3.6 4.4   Liver Function Tests:  Recent Labs Lab 05/17/14 0850 05/18/14 0031 05/18/14 0717  AST 30  --   --   ALT 21  --   --   ALKPHOS 219*  --   --   BILITOT 0.3  --   --   PROT 7.4  --   --   ALBUMIN 3.5 3.0* 3.0*   No results found for this basename: LIPASE, AMYLASE,  in the last 168 hours No results found for this basename: AMMONIA,  in the last 168 hours CBC:  Recent Labs Lab 05/17/14 0850 05/17/14 0909 05/18/14 0031 05/18/14 0717   WBC 7.3  --  7.0 5.9  HGB 11.4* 15.0 10.4* 10.5*  HCT 37.5 44.0 33.4* 33.2*  MCV 95.4  --  91.8 92.0  PLT 299  --  288 275   Cardiac Enzymes:  Recent Labs Lab 05/17/14 0857  TROPONINI <0.30   BNP: BNP (last 3 results)  Recent Labs  10/22/13 1439  PROBNP >70000.0*   CBG:  Recent Labs Lab 05/18/14 0653 05/18/14 1630 05/18/14 2201 05/19/14 0609 05/19/14 1127  GLUCAP 140* 210* 160* 128* 142*       Signed:  Seriah Brotzman  [facility] 05/19/2014, 2:53 PM

## 2014-05-19 NOTE — Progress Notes (Addendum)
Subjective:  No complaints, no further chest discomfort  Objective: Vital signs in last 24 hours: Temp:  [97.9 F (36.6 C)-98.7 F (37.1 C)] 98 F (36.7 C) (09/20 0506) Pulse Rate:  [62-128] 64 (09/20 0506) Resp:  [17-24] 20 (09/20 0506) BP: (93-128)/(36-58) 128/42 mmHg (09/20 0506) SpO2:  [99 %-100 %] 100 % (09/20 0506) Weight:  [53.9 kg (118 lb 13.3 oz)] 53.9 kg (118 lb 13.3 oz) (09/20 0506) Weight change: 0 kg (0 lb)  Intake/Output from previous day: 09/19 0701 - 09/20 0700 In: 360 [P.O.:360] Out: 379 [Urine:50] Intake/Output this shift:   Lab Results:  Recent Labs  05/18/14 0031 05/18/14 0717  WBC 7.0 5.9  HGB 10.4* 10.5*  HCT 33.4* 33.2*  PLT 288 275   BMET:  Recent Labs  05/18/14 0031 05/18/14 0717  NA 135* 134*  K 4.1 4.1  CL 94* 94*  CO2 24 23  GLUCOSE 140* 151*  BUN 28* 30*  CREATININE 4.66* 5.04*  CALCIUM 8.8 9.0  ALBUMIN 3.0* 3.0*   No results found for this basename: PTH,  in the last 72 hours Iron Studies: No results found for this basename: IRON, TIBC, TRANSFERRIN, FERRITIN,  in the last 72 hours  Studies/Results: No results found.  EXAM:  General appearance: Alert, in no apparent distress  Resp: CTA without rales, rhonchi, or wheezes  Cardio: RRR with Gr III/VI systolic murmur, no rub  GI: + BS, soft and nontender  Extremities: Trace pretibial edema  Access: AVF @ LUA with + bruit  Dialysis Orders: MWF @ [facility]  4:30 51.5 kg 350/A1.5 3K/2.25Ca Profile 4 AVF @ LUA No Heparin  No Hectorol Aranesp 80 mcg & Venofer 50 mg on Wed  Assessment/Plan: 1. SVT - @ [facility] 9/7-10 for bradycardia & hypotension, Amiodarone & Diltiazem were dc'd; initially with HR 140s 9/18, improved with Adenosine & IV Amiodarone, but HR increased during HD yesterday, no improvement with UF off, restarted Amiodarone 400 mg PO qd, but not Diltiazem per Cardiology. 2. ESRD - HD on MWF @ [facility], K 4.1. Next HD tomorrow. 3. HTN/volume - BP 128/42 on outpatient Metoprolol 25 mg qd;  CXR stable, wt 53.9 kg s/p net UF 0.3 L yesterday, but limited by SVT. 4. Anemia - Hgb 10.5, Aranesp & Venofer qwk.  5. Metabolic bone disease - Ca 9 (9.8 corrected), P 4.4, iPTH 386; no Hectorol or binders.  6. Nutrition - Alb 3, renal diet, multivitamin.  7. Hx A-fib - on Plavix, BB, restarted Amiodarone.  8. Hx CAD - s/p CABG x 3 09/2009 & PCI with DES 06/2013.  9. Severe COPD  10. Moderate AS - not a surgical candidate    LOS: 2 days   LYLES,CHARLES 05/19/2014,9:34 AM   I have seen and examined this patient and agree with plan as outlined by C. Lyles, PA-C.  Per patient and Cardiology ok for discharge on amiodarone but no dilt.  Hopefully will be able to tolerate HD tomorrow as an outpt. Bader Stubblefield A,MD 05/19/2014 10:47 AM

## 2014-05-19 NOTE — Evaluation (Signed)
Physical Therapy Evaluation Patient Details Name: [name] MRN: [id number] DOB: [birth year] Today's Date: 05/19/2014   History of Present Illness  Admitted with SVT Past Medical History  Diagnosis Date  . CHF (congestive heart failure)     diast SHF, RV failure, pulm HTN  . Diabetes mellitus   . ESRD on hemodialysis     HD MWF in [place]   . Hyperlipidemia   . Anemia   . Hypertension   . COPD (chronic obstructive pulmonary disease)     severe with FEV1 0.64 L  . Peripheral vascular disease   . Coronary artery disease     Hx CABG. Cath 07/06/13 for CP episode showed occlusive graft disease and moderate AS; pt was not a candidate for repeat surgery and a complex PCI/rotablator/stenting procedure of calcified native RCA was done by cardiology  . PAF (paroxysmal atrial fibrillation)     recurrent  . Chronic right-sided heart failure   . Aortic stenosis      Clinical Impression  Patient evaluated by Physical Therapy with no further acute PT needs identified, as pt is to dc today. All education has been completed and the patient has no further questions.  See below for any follow-up Physical Therapy or equipment needs. PT is signing off. Thank you for this referral.     Follow Up Recommendations Home health PT;Supervision - Intermittent    Equipment Recommendations  None recommended by PT    Recommendations for Other Services       Precautions / Restrictions Precautions Precautions: Fall      Mobility  Bed Mobility                  Transfers Overall transfer level: Needs assistance Equipment used: Rolling walker (2 wheeled) Transfers: Sit to/from Stand Sit to Stand: Supervision         General transfer comment: Used RW for steadiness; noted decr control of descent with stand to sit  Ambulation/Gait Ambulation/Gait assistance: Supervision Ambulation Distance (Feet): 100 Feet Assistive device: Rolling walker (2 wheeled) Gait Pattern/deviations:  Step-through pattern;Decreased stride length Gait velocity: decr   General Gait Details: Slow gait, no gross Loss of Balance noted  Stairs            Wheelchair Mobility    Modified Rankin (Stroke Patients Only)       Balance             Standing balance-Leahy Scale: Fair                               Pertinent Vitals/Pain Pain Assessment: No/denies pain HR stable during walk per central cardiac monitoring    Home Living Family/patient expects to be discharged to:: Private residence Living Arrangements: Non-relatives/Friends Available Help at Discharge: Friend(s);Available 24 hours/day Type of Home: House Home Access: Stairs to enter Entrance Stairs-Rails: Psychiatric nurse of Steps: 5-7 Home Layout: One level Home Equipment: Walker - 4 wheels;Shower seat Additional Comments: Reports she is getting HHPT, and HHRN    Prior Function Level of Independence: Needs assistance   Gait / Transfers Assistance Needed: Usually amb with cane. Requires asssist for stairs.  ADL's / Homemaking Assistance Needed: Pt states she needs assist with donning socks and occasionally requires assist for pants when she is fatigued (a family friend assists her).  She needs assist with grocery shopping   Comments: ambulates with cane or RW      Hand  Dominance   Dominant Hand: Right    Extremity/Trunk Assessment   Upper Extremity Assessment: Overall WFL for tasks assessed (HD access L arm)           Lower Extremity Assessment: Generalized weakness (uncontrolled descent to chair)         Communication   Communication: No difficulties  Cognition Arousal/Alertness: Awake/alert Behavior During Therapy: WFL for tasks assessed/performed Overall Cognitive Status: Within Functional Limits for tasks assessed                      General Comments      Exercises        Assessment/Plan    PT Assessment All further PT needs can be  met in the next venue of care  PT Diagnosis Generalized weakness   PT Problem List Decreased strength;Decreased activity tolerance;Decreased balance;Decreased mobility;Decreased coordination;Decreased knowledge of use of DME  PT Treatment Interventions DME instruction;Gait training;Functional mobility training;Therapeutic activities;Therapeutic exercise;Balance training;Patient/family education   PT Goals (Current goals can be found in the Care Plan section) Acute Rehab PT Goals Patient Stated Goal: to go home  PT Goal Formulation: No goals set, d/c therapy    Frequency Min 3X/week   Barriers to discharge        Co-evaluation               End of Session Equipment Utilized During Treatment: Oxygen Activity Tolerance: Patient tolerated treatment well Patient left: in chair;Other (comment) (with rW in fornt of her; offered to move her to bed or chair where she could reach call bell, but she declined) Nurse Communication: Mobility status    Functional Assessment Tool Used: clinical Judgement Functional Limitation: Mobility: Walking and moving around Mobility: Walking and Moving Around Current Status ([id number]): At least 1 percent but less than 20 percent impaired, limited or restricted Mobility: Walking and Moving Around Goal Status [phone]): 0 percent impaired, limited or restricted Mobility: Walking and Moving Around Discharge Status [phone]): At least 1 percent but less than 20 percent impaired, limited or restricted    Time: 1400-1427 PT Time Calculation (min): 27 min   Charges:   PT Evaluation $Initial PT Evaluation Tier I: 1 Procedure PT Treatments $Gait Training: 8-22 mins   PT G Codes:   Functional Assessment Tool Used: clinical Judgement Functional Limitation: Mobility: Walking and moving around    [place] 05/19/2014, 5:29 PM  Roney Marion, [place] Pager [phone] Office [phone]

## 2014-05-19 NOTE — Progress Notes (Signed)
Patient Name: [name] Date of Encounter: 05/19/2014  Principal Problem:   SVT (supraventricular tachycardia) Active Problems:   COPD (chronic obstructive pulmonary disease)   Aortic stenosis, severe   CAD- CABG X 3 09/2009, HSRA/DES to native RCA 07/11/13   ESRD (end stage renal disease)   Paroxysmal atrial fibrillation   Length of Stay: 2  SUBJECTIVE  No cardiac complaints. Has had only one very brief episode of SVT (<4 seconds) since her last dialysis session. No syncope/angina/dyspnea.   CURRENT MEDS . amiodarone  200 mg Oral Daily  . aspirin  81 mg Oral Daily  . heparin  5,000 Units Subcutaneous 3 times per day  . insulin aspart  0-9 Units Subcutaneous TID WC  . multivitamin  1 tablet Oral QHS  . sodium chloride  3 mL Intravenous Q12H    OBJECTIVE   Intake/Output Summary (Last 24 hours) at 05/19/14 0808 Last data filed at 05/19/14 0544  Gross per 24 hour  Intake    360 ml  Output    379 ml  Net    -19 ml   Filed Weights   05/18/14 0712 05/18/14 1235 05/19/14 0506  Weight: 54.2 kg (119 lb 7.8 oz) 53.9 kg (118 lb 13.3 oz) 53.9 kg (118 lb 13.3 oz)    PHYSICAL EXAM Filed Vitals:   05/18/14 1235 05/18/14 1453 05/18/14 2100 05/19/14 0506  BP: 108/48 98/36 93/47  128/42  Pulse: 62 64 65 64  Temp: 97.9 F (36.6 C) 98.7 F (37.1 C) 98.5 F (36.9 C) 98 F (36.7 C)  TempSrc: Oral Oral Oral Oral  Resp: Weight: 53.9 kg (118 lb 13.3 oz)   53.9 kg (118 lb 13.3 oz)  SpO2: 99% 99% 99% 100%   General: Alert, oriented x3, no distress Head: no evidence of trauma, PERRL, EOMI, no exophtalmos or lid lag, no myxedema, no xanthelasma; normal ears, nose and oropharynx Neck: normal jugular venous pulsations and no hepatojugular reflux; delayed carotid pulses and bilateral carotid bruits Chest: clear to auscultation, no signs of consolidation by percussion or palpation, normal fremitus, symmetrical and full respiratory excursions Cardiovascular: normal  position and quality of the apical impulse, regular rhythm, normal first and weak second heart sounds, no rubs or gallops, 3/6 late peaking systolic murmur aortic focus,  Abdomen: no tenderness or distention, no masses by palpation, no abnormal pulsatility or arterial bruits, normal bowel sounds, no hepatosplenomegaly Extremities: no clubbing, cyanosis or edema; 2+ radial, ulnar and brachial pulses bilaterally; 2+ right femoral, posterior tibial and dorsalis pedis pulses; 2+ left femoral, posterior tibial and dorsalis pedis pulses; no subclavian or femoral bruits Large left arm fistula with excellent bruit/thrill Neurological: grossly nonfocal  LABS  CBC  Recent Labs  05/18/14 0031 05/18/14 0717  WBC 7.0 5.9  HGB 10.4* 10.5*  HCT 33.4* 33.2*  MCV 91.8 92.0  PLT 288 275   Basic Metabolic Panel  Recent Labs  05/17/14 0850  05/18/14 0031 05/18/14 0717  NA 136*  < > 135* 134*  K 4.1  < > 4.1 4.1  CL 96  < > 94* 94*  CO2 24  --  24 23  GLUCOSE 83  < > 140* 151*  BUN 20  < > 28* 30*  CREATININE 3.87*  < > 4.66* 5.04*  CALCIUM 9.3  --  8.8 9.0  MG 2.1  --   --   --   PHOS  --   --  3.6 4.4  < > =  values in this interval not displayed. Liver Function Tests  Recent Labs  05/17/14 0850 05/18/14 0031 05/18/14 0717  AST 30  --   --   ALT 21  --   --   ALKPHOS 219*  --   --   BILITOT 0.3  --   --   PROT 7.4  --   --   ALBUMIN 3.5 3.0* 3.0*   No results found for this basename: LIPASE, AMYLASE,  in the last 72 hours Cardiac Enzymes  Recent Labs  05/17/14 0857  TROPONINI <0.30  Radiology Studies Imaging results have been reviewed and Dg Chest Port 1 View  05/17/2014   CLINICAL DATA:  Tachycardia; renal failure  EXAM: PORTABLE CHEST - 1 VIEW  COMPARISON:  May 07, 2014  FINDINGS: The pleural effusion on the right is slightly smaller compared to recent prior study. There is atelectatic change in the right base. Left lung is clear. Heart is enlarged with pulmonary  vascularity within normal limits. There is lower lobe bronchiectatic change, more on the right than on the left. Patient is status post coronary artery bypass grafting. No adenopathy.  IMPRESSION: Right effusion slightly smaller compared to recent prior study. Right base atelectasis present. There is bilateral lower lobe bronchiectatic change, more on the right than on the left. Stable cardiomegaly.   Electronically Signed   By: Bretta Bang M.D.   On: 05/17/2014 09:14    TELE One episode of SVT approx. 4 s   ASSESSMENT AND PLAN OK for DC from Cardiology point of view. Would keep on amiodarone 400 mg daily. Her BP is low - would not restart diltiazem. Mid/long term prognosis is poor due to severe AS without good treatment options.   Thurmon Fair, MD, [facility] [phone] office [phone] pager 05/19/2014 8:08 AM

## 2014-05-19 NOTE — Progress Notes (Signed)
Utilization Review Completed.   Farha Dano, RN, BSN Nurse Case Manager  

## 2014-06-26 ENCOUNTER — Ambulatory Visit: Payer: Medicare Other | Admitting: Cardiovascular Disease

## 2014-07-19 ENCOUNTER — Encounter (HOSPITAL_COMMUNITY): Payer: Self-pay | Admitting: *Deleted

## 2014-07-19 ENCOUNTER — Inpatient Hospital Stay (HOSPITAL_COMMUNITY)
Admission: AD | Admit: 2014-07-19 | Discharge: 2014-07-26 | DRG: 377 | Disposition: A | Discharge status: Skilled Nursing Facility | Payer: Medicare Other | Source: Other Acute Inpatient Hospital | Attending: Internal Medicine | Admitting: Internal Medicine

## 2014-07-19 ENCOUNTER — Inpatient Hospital Stay (HOSPITAL_COMMUNITY): Payer: Medicare Other

## 2014-07-19 DIAGNOSIS — I739 Peripheral vascular disease, unspecified: Secondary | ICD-10-CM | POA: Diagnosis not present

## 2014-07-19 DIAGNOSIS — N186 End stage renal disease: Secondary | ICD-10-CM | POA: Diagnosis present

## 2014-07-19 DIAGNOSIS — Z794 Long term (current) use of insulin: Secondary | ICD-10-CM

## 2014-07-19 DIAGNOSIS — I4510 Unspecified right bundle-branch block: Secondary | ICD-10-CM | POA: Diagnosis not present

## 2014-07-19 DIAGNOSIS — E1121 Type 2 diabetes mellitus with diabetic nephropathy: Secondary | ICD-10-CM | POA: Diagnosis present

## 2014-07-19 DIAGNOSIS — I252 Old myocardial infarction: Secondary | ICD-10-CM | POA: Diagnosis not present

## 2014-07-19 DIAGNOSIS — K2951 Unspecified chronic gastritis with bleeding: Secondary | ICD-10-CM

## 2014-07-19 DIAGNOSIS — Z992 Dependence on renal dialysis: Secondary | ICD-10-CM

## 2014-07-19 DIAGNOSIS — I480 Paroxysmal atrial fibrillation: Secondary | ICD-10-CM | POA: Diagnosis present

## 2014-07-19 DIAGNOSIS — Z9981 Dependence on supplemental oxygen: Secondary | ICD-10-CM | POA: Diagnosis not present

## 2014-07-19 DIAGNOSIS — I272 Other secondary pulmonary hypertension: Secondary | ICD-10-CM | POA: Diagnosis present

## 2014-07-19 DIAGNOSIS — K2971 Gastritis, unspecified, with bleeding: Secondary | ICD-10-CM | POA: Diagnosis present

## 2014-07-19 DIAGNOSIS — I959 Hypotension, unspecified: Secondary | ICD-10-CM | POA: Insufficient documentation

## 2014-07-19 DIAGNOSIS — Z87891 Personal history of nicotine dependence: Secondary | ICD-10-CM

## 2014-07-19 DIAGNOSIS — R001 Bradycardia, unspecified: Secondary | ICD-10-CM | POA: Diagnosis not present

## 2014-07-19 DIAGNOSIS — Z7982 Long term (current) use of aspirin: Secondary | ICD-10-CM

## 2014-07-19 DIAGNOSIS — I248 Other forms of acute ischemic heart disease: Secondary | ICD-10-CM | POA: Diagnosis not present

## 2014-07-19 DIAGNOSIS — Z951 Presence of aortocoronary bypass graft: Secondary | ICD-10-CM | POA: Diagnosis not present

## 2014-07-19 DIAGNOSIS — D62 Acute posthemorrhagic anemia: Secondary | ICD-10-CM | POA: Diagnosis not present

## 2014-07-19 DIAGNOSIS — Z66 Do not resuscitate: Secondary | ICD-10-CM | POA: Diagnosis present

## 2014-07-19 DIAGNOSIS — Z8674 Personal history of sudden cardiac arrest: Secondary | ICD-10-CM

## 2014-07-19 DIAGNOSIS — E46 Unspecified protein-calorie malnutrition: Secondary | ICD-10-CM | POA: Diagnosis present

## 2014-07-19 DIAGNOSIS — Z8711 Personal history of peptic ulcer disease: Secondary | ICD-10-CM

## 2014-07-19 DIAGNOSIS — I2510 Atherosclerotic heart disease of native coronary artery without angina pectoris: Secondary | ICD-10-CM | POA: Diagnosis not present

## 2014-07-19 DIAGNOSIS — I120 Hypertensive chronic kidney disease with stage 5 chronic kidney disease or end stage renal disease: Secondary | ICD-10-CM | POA: Diagnosis not present

## 2014-07-19 DIAGNOSIS — E785 Hyperlipidemia, unspecified: Secondary | ICD-10-CM | POA: Diagnosis not present

## 2014-07-19 DIAGNOSIS — I350 Nonrheumatic aortic (valve) stenosis: Secondary | ICD-10-CM | POA: Diagnosis present

## 2014-07-19 DIAGNOSIS — E1129 Type 2 diabetes mellitus with other diabetic kidney complication: Secondary | ICD-10-CM | POA: Diagnosis present

## 2014-07-19 DIAGNOSIS — R0902 Hypoxemia: Secondary | ICD-10-CM | POA: Diagnosis not present

## 2014-07-19 DIAGNOSIS — J449 Chronic obstructive pulmonary disease, unspecified: Secondary | ICD-10-CM | POA: Diagnosis present

## 2014-07-19 DIAGNOSIS — Z955 Presence of coronary angioplasty implant and graft: Secondary | ICD-10-CM

## 2014-07-19 DIAGNOSIS — R0602 Shortness of breath: Secondary | ICD-10-CM

## 2014-07-19 DIAGNOSIS — N2581 Secondary hyperparathyroidism of renal origin: Secondary | ICD-10-CM | POA: Diagnosis not present

## 2014-07-19 DIAGNOSIS — D631 Anemia in chronic kidney disease: Secondary | ICD-10-CM | POA: Diagnosis not present

## 2014-07-19 DIAGNOSIS — I5042 Chronic combined systolic (congestive) and diastolic (congestive) heart failure: Secondary | ICD-10-CM | POA: Diagnosis present

## 2014-07-19 DIAGNOSIS — E1122 Type 2 diabetes mellitus with diabetic chronic kidney disease: Secondary | ICD-10-CM

## 2014-07-19 DIAGNOSIS — K921 Melena: Secondary | ICD-10-CM | POA: Diagnosis present

## 2014-07-19 DIAGNOSIS — I9589 Other hypotension: Secondary | ICD-10-CM | POA: Diagnosis not present

## 2014-07-19 DIAGNOSIS — N189 Chronic kidney disease, unspecified: Secondary | ICD-10-CM

## 2014-07-19 DIAGNOSIS — Z7901 Long term (current) use of anticoagulants: Secondary | ICD-10-CM | POA: Diagnosis not present

## 2014-07-19 DIAGNOSIS — I25119 Atherosclerotic heart disease of native coronary artery with unspecified angina pectoris: Secondary | ICD-10-CM

## 2014-07-19 LAB — PREPARE RBC (CROSSMATCH)

## 2014-07-19 LAB — BASIC METABOLIC PANEL
Anion gap: 14 (ref 5–15)
BUN: 13 mg/dL (ref 6–23)
CALCIUM: 9.2 mg/dL (ref 8.4–10.5)
CO2: 29 mEq/L (ref 19–32)
Chloride: 99 mEq/L (ref 96–112)
Creatinine, Ser: 2.34 mg/dL — ABNORMAL HIGH (ref 0.50–1.10)
GFR calc Af Amer: 23 mL/min — ABNORMAL LOW (ref >90)
GFR, EST NON AFRICAN AMERICAN: 20 mL/min — AB (ref >90)
Glucose, Bld: 128 mg/dL — ABNORMAL HIGH (ref 70–99)
Potassium: 3.7 mEq/L (ref 3.7–5.3)
Sodium: 142 mEq/L (ref 137–147)

## 2014-07-19 LAB — CBC
HEMATOCRIT: 25.4 % — AB (ref 36.0–46.0)
HEMATOCRIT: 24.6 % — AB (ref 36.0–46.0)
HEMOGLOBIN: 7.6 g/dL — AB (ref 12.0–15.0)
Hemoglobin: 7.4 g/dL — ABNORMAL LOW (ref 12.0–15.0)
MCH: 29.1 pg (ref 26.0–34.0)
MCH: 29.2 pg (ref 26.0–34.0)
MCHC: 29.9 g/dL — AB (ref 30.0–36.0)
MCHC: 30.1 g/dL (ref 30.0–36.0)
MCV: 97.3 fL (ref 78.0–100.0)
MCV: 97.2 fL (ref 78.0–100.0)
Platelets: 197 10*3/uL (ref 150–400)
Platelets: 186 10*3/uL (ref 150–400)
RBC: 2.61 MIL/uL — ABNORMAL LOW (ref 3.87–5.11)
RBC: 2.53 MIL/uL — ABNORMAL LOW (ref 3.87–5.11)
RDW: 22.4 % — AB (ref 11.5–15.5)
RDW: 22.2 % — AB (ref 11.5–15.5)
WBC: 8.7 10*3/uL (ref 4.0–10.5)
WBC: 9.1 10*3/uL (ref 4.0–10.5)

## 2014-07-19 LAB — PROTIME-INR
INR: 1.19 (ref 0.00–1.49)
Prothrombin Time: 15.3 seconds — ABNORMAL HIGH (ref 11.6–15.2)

## 2014-07-19 LAB — OCCULT BLOOD X 1 CARD TO LAB, STOOL: FECAL OCCULT BLD: POSITIVE — AB

## 2014-07-19 LAB — GLUCOSE, CAPILLARY: Glucose-Capillary: 113 mg/dL — ABNORMAL HIGH (ref 70–99)

## 2014-07-19 LAB — MRSA PCR SCREENING: MRSA BY PCR: POSITIVE — AB

## 2014-07-19 MED ORDER — NITROGLYCERIN 0.4 MG SL SUBL: 0.4000 mg | SUBLINGUAL_TABLET | SUBLINGUAL | Status: DC | PRN

## 2014-07-19 MED ORDER — RENA-VITE PO TABS
1.0000 | ORAL_TABLET | Freq: Every day | ORAL | Status: DC
  Administered 2014-07-19 – 2014-07-25 (×7): 1 via ORAL
  Filled 2014-07-19 (×10): qty 1

## 2014-07-19 MED ORDER — SODIUM CHLORIDE 0.9 % IV SOLN
Freq: Once | INTRAVENOUS | Status: AC
  Administered 2014-07-20: via INTRAVENOUS

## 2014-07-19 MED ORDER — LORATADINE 10 MG PO TABS
10.0000 mg | ORAL_TABLET | Freq: Every day | ORAL | Status: DC
  Administered 2014-07-20 – 2014-07-26 (×6): 10 mg via ORAL
  Filled 2014-07-19 (×7): qty 1

## 2014-07-19 MED ORDER — GUAIFENESIN-DM 100-10 MG/5ML PO SYRP
5.0000 mL | ORAL_SOLUTION | ORAL | Status: DC | PRN
  Filled 2014-07-19: qty 5

## 2014-07-19 MED ORDER — ATORVASTATIN CALCIUM 40 MG PO TABS
40.0000 mg | ORAL_TABLET | Freq: Every day | ORAL | Status: DC
  Administered 2014-07-19 – 2014-07-25 (×7): 40 mg via ORAL
  Filled 2014-07-19 (×9): qty 1

## 2014-07-19 MED ORDER — SODIUM CHLORIDE 0.9 % IV SOLN
250.0000 mL | INTRAVENOUS | Status: DC | PRN
  Administered 2014-07-23: 250 mL via INTRAVENOUS

## 2014-07-19 MED ORDER — CHLORHEXIDINE GLUCONATE CLOTH 2 % EX PADS
6.0000 | MEDICATED_PAD | Freq: Every day | CUTANEOUS | Status: AC
  Administered 2014-07-19 – 2014-07-24 (×4): 6 via TOPICAL

## 2014-07-19 MED ORDER — ACETAMINOPHEN 325 MG PO TABS
650.0000 mg | ORAL_TABLET | Freq: Four times a day (QID) | ORAL | Status: DC | PRN
  Administered 2014-07-21: 650 mg via ORAL
  Filled 2014-07-19: qty 2

## 2014-07-19 MED ORDER — ONDANSETRON HCL 4 MG/2ML IJ SOLN: 4.0000 mg | Freq: Four times a day (QID) | INTRAMUSCULAR | Status: DC | PRN

## 2014-07-19 MED ORDER — ONDANSETRON HCL 4 MG PO TABS: 4.0000 mg | ORAL_TABLET | Freq: Four times a day (QID) | ORAL | Status: DC | PRN

## 2014-07-19 MED ORDER — ACETAMINOPHEN 650 MG RE SUPP: 650.0000 mg | Freq: Four times a day (QID) | RECTAL | Status: DC | PRN

## 2014-07-19 MED ORDER — SODIUM CHLORIDE 0.9 % IJ SOLN
3.0000 mL | Freq: Two times a day (BID) | INTRAMUSCULAR | Status: DC
  Administered 2014-07-19 – 2014-07-24 (×8): 3 mL via INTRAVENOUS

## 2014-07-19 MED ORDER — SODIUM CHLORIDE 0.9 % IJ SOLN
3.0000 mL | Freq: Two times a day (BID) | INTRAMUSCULAR | Status: DC
  Administered 2014-07-19 – 2014-07-25 (×7): 3 mL via INTRAVENOUS

## 2014-07-19 MED ORDER — ALBUTEROL SULFATE (2.5 MG/3ML) 0.083% IN NEBU: 2.5000 mg | INHALATION_SOLUTION | RESPIRATORY_TRACT | Status: DC | PRN

## 2014-07-19 MED ORDER — MUPIROCIN 2 % EX OINT
1.0000 "application " | TOPICAL_OINTMENT | Freq: Two times a day (BID) | CUTANEOUS | Status: AC
  Administered 2014-07-19 – 2014-07-24 (×9): 1 via NASAL
  Filled 2014-07-19 (×3): qty 22

## 2014-07-19 MED ORDER — AMIODARONE HCL 200 MG PO TABS
200.0000 mg | ORAL_TABLET | Freq: Every day | ORAL | Status: DC
  Administered 2014-07-20 – 2014-07-26 (×6): 200 mg via ORAL
  Filled 2014-07-19 (×7): qty 1

## 2014-07-19 MED ORDER — SODIUM CHLORIDE 0.9 % IJ SOLN: 3.0000 mL | INTRAMUSCULAR | Status: DC | PRN

## 2014-07-19 MED ORDER — PANTOPRAZOLE SODIUM 40 MG IV SOLR
40.0000 mg | Freq: Two times a day (BID) | INTRAVENOUS | Status: DC
  Administered 2014-07-19 – 2014-07-25 (×12): 40 mg via INTRAVENOUS
  Filled 2014-07-19 (×16): qty 40

## 2014-07-19 MED ORDER — INSULIN ASPART 100 UNIT/ML ~~LOC~~ SOLN
0.0000 [IU] | Freq: Three times a day (TID) | SUBCUTANEOUS | Status: DC
  Administered 2014-07-20 (×2): 2 [IU] via SUBCUTANEOUS
  Administered 2014-07-20: 1 [IU] via SUBCUTANEOUS
  Administered 2014-07-21: 3 [IU] via SUBCUTANEOUS
  Administered 2014-07-22: 1 [IU] via SUBCUTANEOUS
  Administered 2014-07-22: 2 [IU] via SUBCUTANEOUS
  Administered 2014-07-22: 3 [IU] via SUBCUTANEOUS
  Administered 2014-07-24: 2 [IU] via SUBCUTANEOUS
  Administered 2014-07-24: 1 [IU] via SUBCUTANEOUS
  Administered 2014-07-25: 2 [IU] via SUBCUTANEOUS
  Administered 2014-07-25: 3 [IU] via SUBCUTANEOUS

## 2014-07-19 NOTE — Plan of Care (Signed)
Problem: Consults Goal: General Medical Patient Education See Patient Education Module for specific education. Outcome: Completed/Met Date Met:  07/19/14 Goal: Skin Care Protocol Initiated - if Braden Score 18 or less If consults are not indicated, leave blank or document N/A Outcome: Completed/Met Date Met:  07/19/14 Goal: Nutrition Consult-if indicated Outcome: Completed/Met Date Met:  07/19/14 Goal: Diabetes Guidelines if Diabetic/Glucose > 140 If diabetic or lab glucose is > 140 mg/dl - Initiate Diabetes/Hyperglycemia Guidelines & Document Interventions  Outcome: Progressing

## 2014-07-19 NOTE — H&P (Signed)
PATIENT DETAILS Name: [name] Age: 73 y.o. Sex: female Date of Birth: 06-28-1941 Admit Date: 07/19/2014 WJX:BJYNW, Myrene Galas, MD   CHIEF COMPLAINT:  Melena  HPI: [name] is a 73 y.o. female with a Past Medical History of end-stage renal disease on dialysis Mondays/Wednesdays and Fridays, severe aortic stenosis, coronary artery disease status post last PCI in November 2014 currently on aspirin and Plavix who presents today with the above noted complaint. Patient is a very poor historian, and is somewhat uncooperative during this encounter. Apparently, she was at hemodialysis today when she was noted to have a large melanotic stool. She became briefly hypotensive as well, is given IV fluid boluses, and subsequently transferred to [facility]. Upon asking her if she had other episodes of melena today, she is noncommittal, claims she had 3 dark-colored stools. No hematemesis. She claims her stools were somewhat greenish colored yesterday. She claims she did not finish her hemodialysis session this afternoon. She otherwise is comfortable without any shortness of breath. She denies any chest pain, nausea, vomiting or diarrhea.   ALLERGIES:   Allergies  Allergen Reactions  . Codeine Other (See Comments)    POSSIBLY RASH?  Marland Kitchen Sulfa Antibiotics Other (See Comments)    unknown    PAST MEDICAL HISTORY: Past Medical History  Diagnosis Date  . CHF (congestive heart failure)     diast SHF, RV failure, pulm HTN  . Diabetes mellitus   . ESRD on hemodialysis     HD MWF in [place]   . Hyperlipidemia   . Anemia   . Hypertension   . COPD (chronic obstructive pulmonary disease)     severe with FEV1 0.64 L  . Peripheral vascular disease   . Coronary artery disease     Hx CABG. Cath 07/06/13 for CP episode showed occlusive graft disease and moderate AS; pt was not a candidate for repeat surgery and a complex PCI/rotablator/stenting procedure of calcified native RCA was done  by cardiology  . PAF (paroxysmal atrial fibrillation)     recurrent  . Chronic right-sided heart failure   . Aortic stenosis     PAST SURGICAL HISTORY: Past Surgical History  Procedure Laterality Date  . Coronary artery bypass graft  10/20/2009    x3 by Dr Maren Beach with LIMA to the LAD, a vein to the circumflex and a vien to the PDA.  Marland Kitchen Abdominal hysterectomy    . Thoracentesis Left 12/17/2009  . Av fistula placement  01-12-2008    left upper arm  . Cardiac catheterization  10/15/2009    which showed low normal LV function with mild inferior hypocontractility.  . Thoracentesis Right 10/27/2013    MEDICATIONS AT HOME: Prior to Admission medications   Medication Sig Start Date End Date Taking? Authorizing Provider  amiodarone (PACERONE) 400 MG tablet Take 1 tablet (400 mg total) by mouth daily. Patient taking differently: Take 200 mg by mouth daily.  05/19/14  Yes Kathlen Mody, MD  aspirin 81 MG chewable tablet Chew 81 mg by mouth daily.    Yes Historical Provider, MD  atorvastatin (LIPITOR) 40 MG tablet Take 40 mg by mouth at bedtime.   Yes Historical Provider, MD  b complex-vitamin c-folic acid (NEPHRO-VITE) 0.8 MG TABS tablet Take 1 tablet by mouth at bedtime.   Yes Historical Provider, MD  clopidogrel (PLAVIX) 75 MG tablet Take 75 mg by mouth every morning.   Yes Historical Provider, MD  fexofenadine (ALLEGRA) 180 MG  tablet Take 180 mg by mouth daily.   Yes Historical Provider, MD  insulin aspart (NOVOLOG) 100 UNIT/ML injection Inject 2-10 Units into the skin See admin instructions. Sliding scale. 151-200 use 2 units; 201-250 use 4 units; 251-300 use 6 units; 301-350 use 8 units; 351-400 use 10 units   Yes Historical Provider, MD  nitroGLYCERIN (NITROSTAT) 0.4 MG SL tablet Place 0.4 mg under the tongue every 5 (five) minutes as needed for chest pain.   Yes Historical Provider, MD  guaiFENesin-dextromethorphan (ROBITUSSIN DM) 100-10 MG/5ML syrup Take 5 mLs by mouth every 6 (six)  hours as needed for cough. 05/19/14   Kathlen ModyVijaya Akula, MD    FAMILY HISTORY: Family History  Problem Relation Age of Onset  . Heart disease Mother     SOCIAL HISTORY:  reports that she has quit smoking. Her smoking use included Cigarettes. She smoked 0.50 packs per day. She has never used smokeless tobacco. She reports that she does not drink alcohol or use illicit drugs.  REVIEW OF SYSTEMS:  Constitutional:   No  weight loss, night sweats  HEENT:    No headaches, Difficulty swallowing,Tooth/dental problems,Sore throat  Cardio-vascular: No chest pain,  Orthopnea, PND, swelling in lower extremities, anasarca  GI:  No heartburn, indigestion, abdominal pain, nausea, vomiting  Resp: No shortness of breath with exertion or at rest.  No excess mucus, no productive cough, No non-productive cough  Skin:  no rash or lesions.  GU:  no dysuria, change in color of urine, no urgency or frequency.  No flank pain.  Musculoskeletal: No joint pain or swelling.  No decreased range of motion.  No back pain.  Psych: No change in mood or affect. No depression or anxiety.  No memory loss.   PHYSICAL EXAM: Blood pressure 106/46, pulse 52, temperature 98.4 F (36.9 C), temperature source Oral, resp. rate 21, height 5\' 4"  (1.626 m), weight 54.205 kg (119 lb 8 oz), SpO2 100 %.  General appearance :Awake, alert, not in any distress. Speech Clear. Not toxic Looking HEENT: Atraumatic and Normocephalic, pupils equally reactive to light and accomodation Neck: supple, no JVD. No cervical lymphadenopathy.  Chest:Good air entry bilaterally, no added sounds  CVS: S1 S2 regular, 3/6 systolic murmur all over the precordium. Abdomen: Bowel sounds present, Non tender and not distended with no gaurding, rigidity or rebound. Extremities: B/L Lower Ext shows no edema, both legs are warm to touch Neurology:  Non focal Skin:No Rash Wounds:N/A  LABS ON ADMISSION:  No results for input(s): NA, K, CL, CO2,  GLUCOSE, BUN, CREATININE, CALCIUM, MG, PHOS in the last 72 hours. No results for input(s): AST, ALT, ALKPHOS, BILITOT, PROT, ALBUMIN in the last 72 hours. No results for input(s): LIPASE, AMYLASE in the last 72 hours. No results for input(s): WBC, NEUTROABS, HGB, HCT, MCV, PLT in the last 72 hours. No results for input(s): CKTOTAL, CKMB, CKMBINDEX, TROPONINI in the last 72 hours. No results for input(s): DDIMER in the last 72 hours. Invalid input(s): POCBNP   RADIOLOGIC STUDIES ON ADMISSION: No results found.   EKG: Pending  ASSESSMENT AND PLAN: Present on Admission:  . Suspected upper GI bleed: Monitor and stepdown, start IV Protonix 40 mg twice a day. Type and screen, transfuse accordingly. Currently no overt bleeding. We'll consult GI, full liquid diet for now keep nothing by mouth post midnight.   . 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 Suspect mild acute blood loss  anemia on top of anemia of chronic kidney disease. Type and screen, repeat CBC, transfuse if significant drop in hemoglobin.  . Paroxysmal atrial fibrillation: Continue amiodarone, not on Coumadin because of recent PCI and on dual antiplatelet agents. Monitor in telemetry.   . ESRD (end stage renal disease): On hemodialysis, we'll consult nephrology   . DM (diabetes mellitus), type 2 with renal complications: Place SSI, follow CBGs.   Marland Kitchen. CAD- CABG X 3 09/2009, HSRA/DES to native RCA 07/11/13: Hold aspirin/Plavix-given active bleed-resume depending on endoscopy results.  Further plan will depend as patient's clinical course evolves and further radiologic and laboratory data become available. Patient will be monitored closely.  Above noted plan was discussed with patient,she was in agreement.   DVT Prophylaxis: SCD's  Code Status: Previously a DO NOT RESUSCITATE-now wants to "think about it"-full code for now  Disposition Plan: Home when  work up complete  Total time spent for admission equals 45 minutes.  [facility] HospitalGHIMIRE,SHANKER [facility] Pager [phone]  If 7PM-7AM, please contact night-coverage [email or web address] Password [email or web address] 07/19/2014, 6:19 PM

## 2014-07-19 NOTE — Progress Notes (Signed)
73  yo female  with painless melenic stool while on dialysis, Hgb 7.6, transient hypotension, was 10.5  two mo ago. Hx of duodenal ulcer on EGD  10/2010- H.Pylori negative. 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 Would prefer to delay EGD till Plavix on hold for 3-5 days. Will monitor with you. Hold Heparin during dialysis. PPI.

## 2014-07-20 DIAGNOSIS — K921 Melena: Secondary | ICD-10-CM

## 2014-07-20 DIAGNOSIS — I480 Paroxysmal atrial fibrillation: Secondary | ICD-10-CM

## 2014-07-20 LAB — CBC
HCT: 29.8 % — ABNORMAL LOW (ref 36.0–46.0)
Hemoglobin: 9.2 g/dL — ABNORMAL LOW (ref 12.0–15.0)
MCH: 29.4 pg (ref 26.0–34.0)
MCHC: 30.9 g/dL (ref 30.0–36.0)
MCV: 95.2 fL (ref 78.0–100.0)
PLATELETS: 172 10*3/uL (ref 150–400)
RBC: 3.13 MIL/uL — AB (ref 3.87–5.11)
RDW: 21.5 % — ABNORMAL HIGH (ref 11.5–15.5)
WBC: 8.8 10*3/uL (ref 4.0–10.5)

## 2014-07-20 LAB — BASIC METABOLIC PANEL
Anion gap: 18 — ABNORMAL HIGH (ref 5–15)
BUN: 17 mg/dL (ref 6–23)
CO2: 26 mEq/L (ref 19–32)
Calcium: 9.3 mg/dL (ref 8.4–10.5)
Chloride: 97 mEq/L (ref 96–112)
Creatinine, Ser: 2.81 mg/dL — ABNORMAL HIGH (ref 0.50–1.10)
GFR calc non Af Amer: 16 mL/min — ABNORMAL LOW (ref >90)
GFR, EST AFRICAN AMERICAN: 18 mL/min — AB (ref >90)
Glucose, Bld: 146 mg/dL — ABNORMAL HIGH (ref 70–99)
POTASSIUM: 5.1 meq/L (ref 3.7–5.3)
Sodium: 141 mEq/L (ref 137–147)

## 2014-07-20 LAB — GLUCOSE, CAPILLARY
GLUCOSE-CAPILLARY: 177 mg/dL — AB (ref 70–99)
Glucose-Capillary: 124 mg/dL — ABNORMAL HIGH (ref 70–99)
Glucose-Capillary: 134 mg/dL — ABNORMAL HIGH (ref 70–99)
Glucose-Capillary: 197 mg/dL — ABNORMAL HIGH (ref 70–99)
Glucose-Capillary: 197 mg/dL — ABNORMAL HIGH (ref 70–99)
Glucose-Capillary: 252 mg/dL — ABNORMAL HIGH (ref 70–99)

## 2014-07-20 LAB — TYPE AND SCREEN
ABO/RH(D): O POS
Antibody Screen: NEGATIVE
UNIT DIVISION: 0

## 2014-07-20 LAB — CLOSTRIDIUM DIFFICILE BY PCR: Toxigenic C. Difficile by PCR: NEGATIVE

## 2014-07-20 MED ORDER — DARBEPOETIN ALFA 100 MCG/0.5ML IJ SOSY: 100.0000 ug | PREFILLED_SYRINGE | INTRAMUSCULAR | Status: DC

## 2014-07-20 MED ORDER — CALCITRIOL 0.5 MCG PO CAPS
0.5000 ug | ORAL_CAPSULE | ORAL | Status: DC
  Administered 2014-07-22 – 2014-07-26 (×4): 0.5 ug via ORAL
  Filled 2014-07-20 (×5): qty 1

## 2014-07-20 MED ORDER — DARBEPOETIN ALFA 60 MCG/0.3ML IJ SOSY
60.0000 ug | PREFILLED_SYRINGE | Freq: Once | INTRAMUSCULAR | Status: AC
  Administered 2014-07-21: 60 ug via INTRAVENOUS
  Filled 2014-07-20: qty 0.3

## 2014-07-20 NOTE — Progress Notes (Signed)
   Subjective  Pt is asleep, wakes up to answer questions, no complaints   Objective  UGI bleed, melena, No stools last night, ,Hgb 9.2, abdominal exam benign, .Plavix discontinued. Vital signs in last 24 hours: Temp:  [98 F (36.7 C)-99.2 F (37.3 C)] 98.8 F (37.1 C) (11/21 0411) Pulse Rate:  [51-64] 58 (11/21 0500) Resp:  [15-26] 19 (11/21 0500) BP: (106-140)/(28-67) 131/44 mmHg (11/21 0500) SpO2:  [95 %-100 %] 97 % (11/21 0500) Weight:  [119 lb 8 oz (54.205 kg)] 119 lb 8 oz (54.205 kg) (11/20 1439) Last BM Date: 07/19/14 General:   AA female in NAD Heart:  Regular rate and rhythm; 3/9 sys  murmur Lungs: Respirations even and unlabored, lungs CTA bilaterally Abdomen:  Soft, nontender and nondistended. Normal bowel sounds. Extremities:  Without edema. Neurologic:  sleepy  grossly normal neurologically. Psych:  Cooperative. Normal mood and affect.,cooperative  Intake/Output from previous day: 11/20 0701 - 11/21 0700 In: 575 [P.O.:240; Blood:335] Out: -  Intake/Output this shift:    Lab Results:  Recent Labs  07/19/14 1839 07/19/14 2322 07/20/14 0437  WBC 8.7 9.1 8.8  HGB 7.6* 7.4* 9.2*  HCT 25.4* 24.6* 29.8*  PLT 197 186 172   BMET  Recent Labs  07/19/14 1839 07/20/14 0437  NA 142 141  K 3.7 5.1  CL 99 97  CO2 29 26  GLUCOSE 128* 146*  BUN 13 17  CREATININE 2.34* 2.81*  CALCIUM 9.2 9.3   LFT No results for input(s): PROT, ALBUMIN, AST, ALT, ALKPHOS, BILITOT, BILIDIR, IBILI in the last 72 hours. PT/INR  Recent Labs  07/19/14 1839  LABPROT 15.3*  INR 1.19    Studies/Results: Dg Chest Port 1 View  07/19/2014   CLINICAL DATA:  Shortness of breath, history CHF, end-stage renal disease on dialysis, diabetes mellitus, hypertension, hyperlipidemia, COPD, coronary artery disease, paroxysmal atrial fibrillation, former smoker  EXAM: PORTABLE CHEST - 1 VIEW  COMPARISON:  Portable exam 1801 hr compared to 07/19/2014 at 1056 hr  FINDINGS: Enlargement of  cardiac silhouette post CABG.  Pulmonary vascular congestion.  RIGHT pleural effusion and basilar atelectasis.  Central peribronchial thickening.  No gross pulmonary infiltrate, LEFT pleural effusion, or pneumothorax.  IMPRESSION: Enlargement of cardiac silhouette with pulmonary vascular congestion.  Bronchitic changes with persistent RIGHT pleural effusion and basilar atelectasis.   Electronically Signed   By: Ulyses SouthwardMark  Boles M.D.   On: 07/19/2014 18:23       Assessment / Plan:   Upper GI bleed, on HD while on Heparin, has a hx of  H.Pylori negative Duodenal ulcer in 2012,  Hemodynamically stable Off Plavix- Plan EGD tomorrow or 07/22/2014 unless she bleeds acutely Don't use Heparin during HD Follow H/H,  PPI,  Liquid diet  Principal Problem:   Melena Active Problems:   DM (diabetes mellitus), type 2 with renal complications   Aortic stenosis, severe   CAD- CABG X 3 09/2009, HSRA/DES to native RCA 07/11/13   ESRD (end stage renal disease)   Paroxysmal atrial fibrillation     LOS: 1 day   Lina SarDora Jhada Risk  07/20/2014, 7:48 AM

## 2014-07-20 NOTE — Consult Note (Addendum)
Indication for Consultation:  Management of ESRD/hemodialysis; anemia, hypertension/volume and secondary hyperparathyroidism  HPI: [name] is a 73 y.o. female who was sent to the ED yesterday after having a melanous stool on HD. She receives HD MWF @ [facility], history HTN, CHF, afib, CAD s/p CABG. She reports about halfway through her treatment she had a very dark BM and thought it must be blood, history of duodenal ulcer. Denies any previous dark stool, has been feeling well. Somewhat uncooperative with exam and answering questions. Will arrange HD tomorrow.    Chart review: 11/14 - COPD, hx cabg 2011, ESRD on HD > CP and rapid afib at HD, +trop 10. Rx hep, nitrates, converted to NSR. Heart cath showed graft disease, open LIMA to LAD, pulm HTN, EF 50 % mod AS. Seen by surgery not a candidate for surgical intervention. Underwent complex PCI procedure to native RCA with HSRA.  02/15 - chest pain, NSTEMI > again not surg candidate, medical Rx, amio + plavix and ASA 02/15 - afib, AS w medical Rx, pleural effusion, hx RV failure and pulm HTN, CP w known CAD, medical Rx 03/15 - cardiac arrest at OP HD > ROSC after about 10 min. Seen by TAVR team and not felt to be a candidate. Required pressors, didn't tolerate BB due to low BP. Limited code established. Cdif colitis, ESRD on HD, DM, HL, COPD, AS, PAF, known CAD as above, R HF 03/15 - to ED for SVT at HD. Pall care consult. NO intubation or CPR, ok to shock or give meds.  04/15 - passed out at HD > ^trop , no EKG changes. NSTEMI. SVT rx with diltiazem. Syncope due to AS and vol shifts with HD 9/7 -05/09/14 > sent to ED by EMS for bradycardia and hypotension at OP hemodialysis; in hypercarbic resp failure in ED, rx with BiPap. Had pulm edema in addition to severe COPD.  Rx with neb's, HD, bipap and improved. Chronic R pleural effusion. Dilt/ amio stopped for bradycarda. Hx CABG, mod/severe AS, inoperable. Hypotension better holding amlodipine. PAF,  as above amio held for bradycardia.  ESRD MWF DM. 9/18 - 05/19/14 > came to ED after HD for palpitations, HR 150. Found to be in SVT, was treateed with amiodarone and admitted. IV amio transitioned to po and maintained NSR.  Cardizem held d/t hypotension.  F/U after dc. Hx CAD/ CABG on asa, plavix, hx RCA stent 2014 also. COPD. Severe AS inoperable.     Past Medical History  Diagnosis Date  . CHF (congestive heart failure)     diast SHF, RV failure, pulm HTN  . Diabetes mellitus   . ESRD on hemodialysis     HD MWF in [place]   . Hyperlipidemia   . Anemia   . Hypertension   . COPD (chronic obstructive pulmonary disease)     severe with FEV1 0.64 L  . Peripheral vascular disease   . Coronary artery disease     Hx CABG. Cath 07/06/13 for CP episode showed occlusive graft disease and moderate AS; pt was not a candidate for repeat surgery and a complex PCI/rotablator/stenting procedure of calcified native RCA was done by cardiology  . PAF (paroxysmal atrial fibrillation)     recurrent  . Chronic right-sided heart failure   . Aortic stenosis    Past Surgical History  Procedure Laterality Date  . Coronary artery bypass graft  10/20/2009    x3 by Dr Maren BeachVanTrigt with LIMA to the LAD, a vein to the circumflex  and a vien to the PDA.  Marland Kitchen. Abdominal hysterectomy    . Thoracentesis Left 12/17/2009  . Av fistula placement  01-12-2008    left upper arm  . Cardiac catheterization  10/15/2009    which showed low normal LV function with mild inferior hypocontractility.  . Thoracentesis Right 10/27/2013   Family History  Problem Relation Age of Onset  . Heart disease Mother    Social History:  reports that she has quit smoking. Her smoking use included Cigarettes. She smoked 0.50 packs per day. She has never used smokeless tobacco. She reports that she does not drink alcohol or use illicit drugs. Allergies  Allergen Reactions  . Codeine Other (See Comments)    POSSIBLY RASH?  Marland Kitchen. Sulfa Antibiotics  Other (See Comments)    unknown   Prior to Admission medications   Medication Sig Start Date End Date Taking? Authorizing Provider  amiodarone (PACERONE) 400 MG tablet Take 1 tablet (400 mg total) by mouth daily. Patient taking differently: Take 200 mg by mouth daily.  05/19/14  Yes Kathlen ModyVijaya Akula, MD  aspirin 81 MG chewable tablet Chew 81 mg by mouth daily.    Yes Historical Provider, MD  atorvastatin (LIPITOR) 40 MG tablet Take 40 mg by mouth at bedtime.   Yes Historical Provider, MD  b complex-vitamin c-folic acid (NEPHRO-VITE) 0.8 MG TABS tablet Take 1 tablet by mouth at bedtime.   Yes Historical Provider, MD  clopidogrel (PLAVIX) 75 MG tablet Take 75 mg by mouth every morning.   Yes Historical Provider, MD  fexofenadine (ALLEGRA) 180 MG tablet Take 180 mg by mouth daily.   Yes Historical Provider, MD  insulin aspart (NOVOLOG) 100 UNIT/ML injection Inject 2-10 Units into the skin See admin instructions. Sliding scale. 151-200 use 2 units; 201-250 use 4 units; 251-300 use 6 units; 301-350 use 8 units; 351-400 use 10 units   Yes Historical Provider, MD  nitroGLYCERIN (NITROSTAT) 0.4 MG SL tablet Place 0.4 mg under the tongue every 5 (five) minutes as needed for chest pain.   Yes Historical Provider, MD   Current Facility-Administered Medications  Medication Dose Route Frequency Provider Last Rate Last Dose  . 0.9 %  sodium chloride infusion  250 mL Intravenous PRN Maretta BeesShanker M Ghimire, MD   Stopped at 07/20/14 0301  . acetaminophen (TYLENOL) tablet 650 mg  650 mg Oral Q6H PRN Shanker Levora DredgeM Ghimire, MD       Or  . acetaminophen (TYLENOL) suppository 650 mg  650 mg Rectal Q6H PRN Shanker Levora DredgeM Ghimire, MD      . albuterol (PROVENTIL) (2.5 MG/3ML) 0.083% nebulizer solution 2.5 mg  2.5 mg Nebulization Q2H PRN Shanker Levora DredgeM Ghimire, MD      . amiodarone (PACERONE) tablet 200 mg  200 mg Oral Daily Shanker Levora DredgeM Ghimire, MD      . atorvastatin (LIPITOR) tablet 40 mg  40 mg Oral QHS Maretta BeesShanker M Ghimire, MD   40 mg at  07/19/14 2122  . Chlorhexidine Gluconate Cloth 2 % PADS 6 each  6 each Topical Q0600 Maretta BeesShanker M Ghimire, MD   6 each at 07/19/14 2038  . guaiFENesin-dextromethorphan (ROBITUSSIN DM) 100-10 MG/5ML syrup 5 mL  5 mL Oral Q4H PRN Shanker Levora DredgeM Ghimire, MD      . insulin aspart (novoLOG) injection 0-9 Units  0-9 Units Subcutaneous TID WC Shanker Levora DredgeM Ghimire, MD      . loratadine (CLARITIN) tablet 10 mg  10 mg Oral Daily Shanker Levora DredgeM Ghimire, MD      .  multivitamin (RENA-VIT) tablet 1 tablet  1 tablet Oral QHS Maretta Bees, MD   1 tablet at 07/19/14 2122  . mupirocin ointment (BACTROBAN) 2 % 1 application  1 application Nasal BID Maretta Bees, MD   1 application at 07/19/14 2132  . nitroGLYCERIN (NITROSTAT) SL tablet 0.4 mg  0.4 mg Sublingual Q5 min PRN Shanker Levora Dredge, MD      . ondansetron [facility]) tablet 4 mg  4 mg Oral Q6H PRN Shanker Levora Dredge, MD       Or  . ondansetron (ZOFRAN) injection 4 mg  4 mg Intravenous Q6H PRN Maretta Bees, MD      . pantoprazole (PROTONIX) injection 40 mg  40 mg Intravenous Q12H Maretta Bees, MD   40 mg at 07/19/14 2125  . sodium chloride 0.9 % injection 3 mL  3 mL Intravenous Q12H Maretta Bees, MD   3 mL at 07/19/14 2128  . sodium chloride 0.9 % injection 3 mL  3 mL Intravenous Q12H Maretta Bees, MD   3 mL at 07/19/14 2128  . sodium chloride 0.9 % injection 3 mL  3 mL Intravenous PRN Maretta Bees, MD       Labs: Basic Metabolic Panel:  Recent Labs Lab 07/19/14 1839 07/20/14 0437  NA 142 141  K 3.7 5.1  CL 99 97  CO2 29 26  GLUCOSE 128* 146*  BUN 13 17  CREATININE 2.34* 2.81*  CALCIUM 9.2 9.3   Liver Function Tests: No results for input(s): AST, ALT, ALKPHOS, BILITOT, PROT, ALBUMIN in the last 168 hours. No results for input(s): LIPASE, AMYLASE in the last 168 hours. No results for input(s): AMMONIA in the last 168 hours. CBC:  Recent Labs Lab 07/19/14 1839 07/19/14 2322 07/20/14 0437  WBC 8.7 9.1 8.8  HGB 7.6* 7.4*  9.2*  HCT 25.4* 24.6* 29.8*  MCV 97.3 97.2 95.2  PLT 197 186 172   Cardiac Enzymes: No results for input(s): CKTOTAL, CKMB, CKMBINDEX, TROPONINI in the last 168 hours. CBG:  Recent Labs Lab 07/19/14 1619 07/19/14 1955 07/19/14 2347 07/20/14 0755  GLUCAP 113* 124* 177* 134*   Iron Studies: No results for input(s): IRON, TIBC, TRANSFERRIN, FERRITIN in the last 72 hours. Studies/Results: Dg Chest Port 1 View  07/19/2014   CLINICAL DATA:  Shortness of breath, history CHF, end-stage renal disease on dialysis, diabetes mellitus, hypertension, hyperlipidemia, COPD, coronary artery disease, paroxysmal atrial fibrillation, former smoker  EXAM: PORTABLE CHEST - 1 VIEW  COMPARISON:  Portable exam 1801 hr compared to 07/19/2014 at 1056 hr  FINDINGS: Enlargement of cardiac silhouette post CABG.  Pulmonary vascular congestion.  RIGHT pleural effusion and basilar atelectasis.  Central peribronchial thickening.  No gross pulmonary infiltrate, LEFT pleural effusion, or pneumothorax.  IMPRESSION: Enlargement of cardiac silhouette with pulmonary vascular congestion.  Bronchitic changes with persistent RIGHT pleural effusion and basilar atelectasis.   Electronically Signed   By: Ulyses Southward M.D.   On: 07/19/2014 18:23   Review of Systems: Gen: Denies any fever, chills, sweats, anorexia, fatigue, weakness, malaise, weight loss, and sleep disorder HEENT: No visual complaints, No history of Retinopathy. Normal external appearance No Epistaxis or Sore throat. No sinusitis.   CV: Denies chest pain, angina, palpitations, syncope, orthopnea, PND, peripheral edema, and claudication. Resp: Reports occasional SOB, PRN 02 at home. Denies cough, sputum, wheezing, coughing up blood, and pleurisy.  GI: Reports one dark stool yesterday at HD. Denies vomiting blood, jaundice, and fecal incontinence.  Denies dysphagia or odynophagia. GU : Denies urinary burning, blood in urine, urinary frequency, urinary hesitancy,  nocturnal urination, and urinary incontinence.  No renal calculi. MS: Denies joint pain, limitation of movement, and swelling, stiffness, low back pain, extremity pain. Denies muscle weakness, cramps, atrophy.  No use of non steroidal antiinflammatory drugs. Derm: Denies rash, itching, dry skin, hives, moles, warts, or unhealing ulcers.  Psych: Denies depression, anxiety, memory loss, suicidal ideation, hallucinations, paranoia, and confusion. Heme: Denies bruising, bleeding, and enlarged lymph nodes. Neuro: No headache.  No diplopia. No dysarthria.  No dysphasia.  No history of CVA.  No Seizures. No paresthesias.  No weakness. Endocrine.  No Thyroid disease.  No Adrenal disease.  Physical Exam: Filed Vitals:   07/20/14 0300 07/20/14 0411 07/20/14 0500 07/20/14 0758  BP: 140/44 122/44 131/44 128/47  Pulse: 57 64 58   Temp:  98.8 F (37.1 C)  98.3 F (36.8 C)  TempSrc:  Oral  Oral  Resp: 20 21 19    Height:      Weight:      SpO2: 96% 96% 97%      General: Well developed, well nourished, in no acute distress.  Head: Normocephalic, atraumatic, sclera non-icteric, mucus membranes are moist Neck: Supple. JVD not elevated.  Lungs: Clear bilaterally to auscultation without wheezes, rales, or rhonchi. Breathing is unlabored. Heart: RRR 2/6 systolic murmur Abdomen: Soft, non-tender, non-distended with normoactive bowel sounds. No rebound/guarding. No obvious abdominal masses. M-S:  Strength and tone appear normal for age. Lower extremities:without edema or ischemic changes, no open wounds  Neuro: Alert and oriented X 3. Moves all extremities spontaneously. Psych:  Responds to questions appropriately with a normal affect. Dialysis Access: L AVF +b/t  Dialysis Orders:  MWF [facility]   4hr 30 mins     51.5kgs  2K/2.25ca   No Heparin L AVF  160   350/1.5    Calcitriol 0.5 mcg    Aranesp  100  Profile 4  Assessment/Plan: 1.  Melena- hx of Duodenal ulcer. On ASA and plavix at home, now on hold.  EGD pending. No heparin in HD. GI following 2.  ESRD -  MWF @ [facility]. Next HD tomorrow. K+5.1 3.  Hypertension/volume  - 128/47. Not reaching edw outpt.  4.  Anemia  - hgb 9.2 s/p 1u RBC Cont Aranesp, supplement dose tomorrow and full dose Wends. No Fe. Watch CBC. Transfuse PRN 5.  Metabolic bone disease -  Ca+ 9.3 cont calcitriol.  6.  Nutrition - NPO. Multivit. No binders. Last PTH 594, phos 5 7. afib- amiodarone 8. DM- per primary  Jetty Duhamel, NP [facility] [phone] 07/20/2014, 9:08 AM   Pt seen, examined and agree w A/P as above.  Vinson Moselle MD pager [phone]    cell [phone] 07/20/2014, 11:50 AM

## 2014-07-20 NOTE — Progress Notes (Signed)
PATIENT DETAILS Name: [name] Age: 73 y.o. Sex: female Date of Birth: Mar 16, 1941 Admit Date: 07/19/2014 Admitting Physician Joseph ArtJessica U Vann, DO VWU:JWJXBPCP:HAGUE, Myrene GalasIMRAN P, MD  Subjective: Developed diarrhea overnight  Assessment/Plan: Principal Problem:   Suspect UGI Bleed:Continue PPI, Transfuse prn. Stopped ASA/Plavix for now. Has prior hx of Duodenal ulcer. GI planning EGD after Plavix washout.  Active Problems:   Acute Blood loss anemia: has acute blood loss anemia on top of anemia of chronic kidney disease. Hb dropped to 7.4, given one unit of PRBC. Continue to follow CBC and transfuse prn.   Diarrhea:await C Diff PCR. Supportive care.    Paroxysmal atrial fibrillation: Continue amiodarone, not on Coumadin because of recent PCI and on dual antiplatelet agents. Monitor in telemetry.    ESRD (end stage renal disease): On hemodialysis M/W/F. Renal consulted.   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  Disposition: Remain inpatient  Antibiotics:  Anti-infectives    None     DVT Prophylaxis: SCD's  Code Status:  DNR-initially a full coder-"wanted to think about it"-but today consents to DNR.   Family Communication Everlean AlstromMaurice [phone]-son-[phone]-wrong number  Procedures:  None  CONSULTS:  GI and nephrology  Time spent 40 minutes-which includes 50% of the time with face-to-face with patient/ family and coordinating care related to the above assessment and plan.  MEDICATIONS: Scheduled Meds: . amiodarone  200 mg Oral Daily  . atorvastatin  40 mg Oral QHS  . [START ON 07/22/2014] calcitRIOL  0.5 mcg Oral Q M,W,F-HD  . Chlorhexidine Gluconate Cloth  6 each Topical Q0600  . [START ON 07/24/2014] darbepoetin  (ARANESP) injection - DIALYSIS  100 mcg Intravenous Q Wed-HD  . [START ON 07/21/2014] darbepoetin (ARANESP) injection - DIALYSIS  60 mcg Intravenous Once  . insulin aspart  0-9 Units Subcutaneous TID WC  . loratadine  10 mg Oral Daily  . multivitamin  1 tablet Oral QHS  . mupirocin ointment  1 application Nasal BID  . pantoprazole (PROTONIX) IV  40 mg Intravenous Q12H  . sodium chloride  3 mL Intravenous Q12H  . sodium chloride  3 mL Intravenous Q12H   Continuous Infusions:  PRN Meds:.sodium chloride, acetaminophen **OR** acetaminophen, albuterol, guaiFENesin-dextromethorphan, nitroGLYCERIN, ondansetron **OR** ondansetron (ZOFRAN) IV, sodium chloride    PHYSICAL EXAM: Vital signs in last 24 hours: Filed Vitals:   07/20/14 0300 07/20/14 0411 07/20/14 0500 07/20/14 0758  BP: 140/44 122/44 131/44 128/47  Pulse: 57 64 58   Temp:  98.8 F (37.1 C)  98.3 F (36.8 C)  TempSrc:  Oral  Oral  Resp: 20 21 19    Height:      Weight:      SpO2: 96% 96% 97%     Weight change:  Filed Weights   07/19/14 1439  Weight: 54.205 kg (119 lb 8 oz)   Body mass index is 20.5 kg/(m^2).   Gen Exam: Awake and alert with clear speech.   Neck: Supple, No JVD.   Chest: B/L Clear.   CVS: S1 S2 Regular, +3/6 syst murmur Abdomen: soft, BS +, non tender, non distended.  Extremities: no edema, lower extremities warm to touch. Neurologic: Non Focal.   Skin: No Rash.   Wounds: N/A.   Intake/Output from  previous day:  Intake/Output Summary (Last 24 hours) at 07/20/14 1001 Last data filed at 07/20/14 0923  Gross per 24 hour  Intake    578 ml  Output      0 ml  Net    578 ml     LAB RESULTS: CBC  Recent Labs Lab 07/19/14 1839 07/19/14 2322 07/20/14 0437  WBC 8.7 9.1 8.8  HGB 7.6* 7.4* 9.2*  HCT 25.4* 24.6* 29.8*  PLT 197 186 172  MCV 97.3 97.2 95.2  MCH 29.1 29.2 29.4  MCHC 29.9* 30.1 30.9  RDW 22.4* 22.2* 21.5*    Chemistries   Recent Labs Lab 07/19/14 1839 07/20/14 0437  NA  142 141  K 3.7 5.1  CL 99 97  CO2 29 26  GLUCOSE 128* 146*  BUN 13 17  CREATININE 2.34* 2.81*  CALCIUM 9.2 9.3    CBG:  Recent Labs Lab 07/19/14 1619 07/19/14 1955 07/19/14 2347 07/20/14 0755  GLUCAP 113* 124* 177* 134*    GFR Estimated Creatinine Clearance: 15.7 mL/min (by C-G formula based on Cr of 2.81).  Coagulation profile  Recent Labs Lab 07/19/14 1839  INR 1.19    Cardiac Enzymes No results for input(s): CKMB, TROPONINI, MYOGLOBIN in the last 168 hours.  Invalid input(s): CK  Invalid input(s): POCBNP No results for input(s): DDIMER in the last 72 hours. No results for input(s): HGBA1C in the last 72 hours. No results for input(s): CHOL, HDL, LDLCALC, TRIG, CHOLHDL, LDLDIRECT in the last 72 hours. No results for input(s): TSH, T4TOTAL, T3FREE, THYROIDAB in the last 72 hours.  Invalid input(s): FREET3 No results for input(s): VITAMINB12, FOLATE, FERRITIN, TIBC, IRON, RETICCTPCT in the last 72 hours. No results for input(s): LIPASE, AMYLASE in the last 72 hours.  Urine Studies No results for input(s): UHGB, CRYS in the last 72 hours.  Invalid input(s): UACOL, UAPR, USPG, UPH, UTP, UGL, UKET, UBIL, UNIT, UROB, ULEU, UEPI, UWBC, URBC, UBAC, CAST, UCOM, BILUA  MICROBIOLOGY: Recent Results (from the past 240 hour(s))  MRSA PCR Screening     Status: Abnormal   Collection Time: 07/19/14  3:34 PM  Result Value Ref Range Status   MRSA by PCR POSITIVE (A) NEGATIVE Final    Comment:        The GeneXpert MRSA Assay (FDA approved for NASAL specimens only), is one component of a comprehensive MRSA colonization surveillance program. It is not intended to diagnose MRSA infection nor to guide or monitor treatment for MRSA infections. RESULT CALLED TO, READ BACK BY AND VERIFIED WITH: HOWELL RN  17:30 07/19/14 ([email or web address])     RADIOLOGY STUDIES/RESULTS: Dg Chest Port 1 View  07/19/2014   CLINICAL DATA:  Shortness of breath, history CHF, end-stage renal  disease on dialysis, diabetes mellitus, hypertension, hyperlipidemia, COPD, coronary artery disease, paroxysmal atrial fibrillation, former smoker  EXAM: PORTABLE CHEST - 1 VIEW  COMPARISON:  Portable exam 1801 hr compared to 07/19/2014 at 1056 hr  FINDINGS: Enlargement of cardiac silhouette post CABG.  Pulmonary vascular congestion.  RIGHT pleural effusion and basilar atelectasis.  Central peribronchial thickening.  No gross pulmonary infiltrate, LEFT pleural effusion, or pneumothorax.  IMPRESSION: Enlargement of cardiac silhouette with pulmonary vascular congestion.  Bronchitic changes with persistent RIGHT pleural effusion and basilar atelectasis.   Electronically Signed   By: Ulyses SouthwardMark  Boles M.D.   On: 07/19/2014 18:23    Jeoffrey MassedGHIMIRE,Angeni Chaudhuri, MD  [facility] Pager:[phone]  If 7PM-7AM, please contact night-coverage [email or web address] Password [email or web address] 07/20/2014, 10:01 AM   LOS: 1  day

## 2014-07-20 NOTE — Progress Notes (Signed)
Nutrition Brief Note  Patient identified on the Malnutrition Screening Tool (MST) Report  Wt Readings from Last 15 Encounters:  07/19/14 119 lb 8 oz (54.205 kg)  05/19/14 118 lb 13.3 oz (53.9 kg)  05/09/14 118 lb 2.7 oz (53.6 kg)  03/26/14 116 lb 9.6 oz (52.889 kg)  03/19/14 114 lb (51.71 kg)  01/11/14 117 lb (53.071 kg)  12/12/13 114 lb 10.2 oz (52 kg)  12/06/13 116 lb 12.8 oz (52.98 kg)  11/22/13 120 lb 3.2 oz (54.522 kg)  11/19/13 116 lb 1.9 oz (52.67 kg)  11/12/13 124 lb 9 oz (56.5 kg)  10/27/13 115 lb 3.2 oz (52.254 kg)  10/09/13 115 lb 1.3 oz (52.2 kg)  09/11/13 123 lb 12.8 oz (56.155 kg)  07/19/13 119 lb 6.4 oz (54.159 kg)   [name] is a 73 y.o. female with a Past Medical History of end-stage renal disease on dialysis Mondays/Wednesdays and Fridays, severe aortic stenosis, coronary artery disease status post last PCI in November 2014 currently on aspirin and Plavix who presents today with melena.   Pt reports fair to good appetite PTA. She is consuming full liquid breakfast tray currently, without difficulty. She reports that she ate all of her potato soup last night. She expresses desire for diet advancement and feels like she could tolerate more solid foods. Discussed rationale for current diet order.   She denies weight loss. Documented wt hx reveals wt stability over the past year. Nutrition-focused physical exam reveals no signs of fat or muscle depletion.   Body mass index is 20.5 kg/(m^2). Patient meets criteria for normal weight range based on current BMI.   Current diet order is full liquid, patient is consuming approximately n/a (however reports good appetite)% of meals at this time. Labs and medications reviewed.   No nutrition interventions warranted at this time. If nutrition issues arise, please consult RD.   Elani Delph A. Mayford KnifeWilliams, RD, LDN Pager: [phone] After hours Pager: [phone]

## 2014-07-21 DIAGNOSIS — D62 Acute posthemorrhagic anemia: Secondary | ICD-10-CM

## 2014-07-21 LAB — RENAL FUNCTION PANEL
ALBUMIN: 3.1 g/dL — AB (ref 3.5–5.2)
ANION GAP: 17 — AB (ref 5–15)
BUN: 23 mg/dL (ref 6–23)
CALCIUM: 9.1 mg/dL (ref 8.4–10.5)
CO2: 25 mEq/L (ref 19–32)
Chloride: 94 mEq/L — ABNORMAL LOW (ref 96–112)
Creatinine, Ser: 3.73 mg/dL — ABNORMAL HIGH (ref 0.50–1.10)
GFR calc non Af Amer: 11 mL/min — ABNORMAL LOW (ref >90)
GFR, EST AFRICAN AMERICAN: 13 mL/min — AB (ref >90)
Glucose, Bld: 173 mg/dL — ABNORMAL HIGH (ref 70–99)
PHOSPHORUS: 3.9 mg/dL (ref 2.3–4.6)
POTASSIUM: 5 meq/L (ref 3.7–5.3)
SODIUM: 136 meq/L — AB (ref 137–147)

## 2014-07-21 LAB — GLUCOSE, CAPILLARY
GLUCOSE-CAPILLARY: 234 mg/dL — AB (ref 70–99)
GLUCOSE-CAPILLARY: 155 mg/dL — AB (ref 70–99)
Glucose-Capillary: 80 mg/dL (ref 70–99)

## 2014-07-21 LAB — CBC
HCT: 29 % — ABNORMAL LOW (ref 36.0–46.0)
Hemoglobin: 9 g/dL — ABNORMAL LOW (ref 12.0–15.0)
MCH: 29.5 pg (ref 26.0–34.0)
MCHC: 31 g/dL (ref 30.0–36.0)
MCV: 95.1 fL (ref 78.0–100.0)
PLATELETS: 174 10*3/uL (ref 150–400)
RBC: 3.05 MIL/uL — ABNORMAL LOW (ref 3.87–5.11)
RDW: 21.6 % — AB (ref 11.5–15.5)
WBC: 8.8 10*3/uL (ref 4.0–10.5)

## 2014-07-21 MED ORDER — SODIUM CHLORIDE 0.9 % IV SOLN: 100.0000 mL | INTRAVENOUS | Status: DC | PRN

## 2014-07-21 MED ORDER — LIDOCAINE HCL (PF) 1 % IJ SOLN: 5.0000 mL | INTRAMUSCULAR | Status: DC | PRN

## 2014-07-21 MED ORDER — NEPRO/CARBSTEADY PO LIQD: 237.0000 mL | ORAL | Status: DC | PRN

## 2014-07-21 MED ORDER — ALTEPLASE 2 MG IJ SOLR
2.0000 mg | Freq: Once | INTRAMUSCULAR | Status: AC | PRN
  Filled 2014-07-21: qty 2

## 2014-07-21 MED ORDER — PENTAFLUOROPROP-TETRAFLUOROETH EX AERO: 1.0000 "application " | INHALATION_SPRAY | CUTANEOUS | Status: DC | PRN

## 2014-07-21 MED ORDER — DARBEPOETIN ALFA 60 MCG/0.3ML IJ SOSY
PREFILLED_SYRINGE | INTRAMUSCULAR | Status: AC
Start: 2014-07-21 — End: 2014-07-21
  Filled 2014-07-21: qty 0.3

## 2014-07-21 MED ORDER — LOPERAMIDE HCL 2 MG PO CAPS
2.0000 mg | ORAL_CAPSULE | ORAL | Status: DC | PRN
  Administered 2014-07-22 (×2): 2 mg via ORAL
  Filled 2014-07-21 (×5): qty 1

## 2014-07-21 MED ORDER — LIDOCAINE-PRILOCAINE 2.5-2.5 % EX CREA: 1.0000 "application " | TOPICAL_CREAM | CUTANEOUS | Status: DC | PRN

## 2014-07-21 NOTE — Progress Notes (Signed)
  [facility] Progress Note   Subjective: no complaints, on HD  Filed Vitals:   07/21/14 1000 07/21/14 1030 07/21/14 1045 07/21/14 1100  BP: 94/40 98/48 104/47 110/46  Pulse: 58 58  58  Temp:    98.1 F (36.7 C)  TempSrc:    Oral  Resp: 19 19  18   Height:      Weight:    54.1 kg (119 lb 4.3 oz)  SpO2:    100%   Exam: Alert, frail elderly female, no distress No jvd Chest clear bilat RRR 2/6 syst M Abd soft, NTND No LE edema or UE edema L AVF +bruit Neuro is nf, Ox 3  HD: MWF [facility] 4hr 30 mins 51.5kgs 2K/2.25ca No Heparin L AVF 160 350/1.5  Calcitriol 0.5 mcg Aranesp 100  Profile 4       Assessment: 1. Melena- hx of Duodenal ulcer. On ASA and plavix at home, now on hold. EGD pending. No heparin w HD, GI following.  2. ESRD - on HD 3. Hypertension/volume - 128/47. Not reaching edw outpt. Prob has gained weight, no extra vol on exam, may need to raise dry wt some 4. Anemia - hgb 9.2 s/p 1u RBC Cont Aranesp, supplement dose tomorrow and full dose Wends. No Fe. Watch CBC. Transfuse PRN 5. Metabolic bone disease - Ca+ 9.3 cont calcitriol.  6. Nutrition - NPO. Multivit. No binders. Last PTH 594, phos 5 7. afib- amiodarone 8. DM- per primary  Plan- HD today, consider raising dry wt 1-2 kg.     Vinson Moselleob Dejanae Helser MD  pager [phone]    cell [phone]  07/21/2014, 11:50 AM     Recent Labs Lab 07/19/14 1839 07/20/14 0437 07/21/14 0351  NA 142 141 136*  K 3.7 5.1 5.0  CL 99 97 94*  CO2 29 26 25   GLUCOSE 128* 146* 173*  BUN 13 17 23   CREATININE 2.34* 2.81* 3.73*  CALCIUM 9.2 9.3 9.1  PHOS  --   --  3.9    Recent Labs Lab 07/21/14 0351  ALBUMIN 3.1*    Recent Labs Lab 07/19/14 2322 07/20/14 0437 07/21/14 0351  WBC 9.1 8.8 8.8  HGB 7.4* 9.2* 9.0*  HCT 24.6* 29.8* 29.0*  MCV 97.2 95.2 95.1  PLT 186 172 174   . amiodarone  200 mg Oral Daily  . atorvastatin  40 mg Oral QHS  . [START ON 07/22/2014] calcitRIOL  0.5 mcg  Oral Q M,W,F-HD  . Chlorhexidine Gluconate Cloth  6 each Topical Q0600  . [START ON 07/24/2014] darbepoetin (ARANESP) injection - DIALYSIS  100 mcg Intravenous Q Wed-HD  . insulin aspart  0-9 Units Subcutaneous TID WC  . loratadine  10 mg Oral Daily  . multivitamin  1 tablet Oral QHS  . mupirocin ointment  1 application Nasal BID  . pantoprazole (PROTONIX) IV  40 mg Intravenous Q12H  . sodium chloride  3 mL Intravenous Q12H  . sodium chloride  3 mL Intravenous Q12H     sodium chloride, acetaminophen **OR** acetaminophen, albuterol, guaiFENesin-dextromethorphan, loperamide, nitroGLYCERIN, ondansetron **OR** ondansetron (ZOFRAN) IV, sodium chloride

## 2014-07-21 NOTE — Progress Notes (Signed)
S/p IGI bleed on Plavix. She has been of for 3 days. Will see her tomorrow to set up EGD for Tuesday  07/23/2014

## 2014-07-21 NOTE — Procedures (Signed)
I was present at this dialysis session, have reviewed the session itself and made  appropriate changes  Vinson Moselleob Theoden Mauch MD (pgr) [phone]    [phone] 07/21/2014, 11:58 AM

## 2014-07-21 NOTE — Progress Notes (Signed)
PATIENT DETAILS Name: [name] Age: 73 y.o. Sex: female Date of Birth: 12-26-1940 Admit Date: 07/19/2014 Admitting Physician Joseph Art, DO [id number], Myrene Galas, MD  Subjective: No major complaints.  Assessment/Plan: Principal Problem:   Suspect UGI Bleed:Continue PPI, Transfuse 1 unit of PRBC on day of admit. Has prior hx of Duodenal Ulcer.Stopped ASA/Plavix for now. GI planning EGD after Plavix washout.  Active Problems:   Acute Blood loss anemia: has acute blood loss anemia on top of anemia of chronic kidney disease. Hb dropped to 7.4 on admission, given one unit of PRBC. Hb stable at 9.0 today.Continue to follow CBC and transfuse prn.   Diarrhea:C Diff PCR negative. Supportive care-Imodium prn.    Paroxysmal atrial fibrillation: Continue amiodarone, not on Coumadin because of recent PCI and on dual antiplatelet agents. Monitor in telemetry.    ESRD (end stage renal disease): On hemodialysis M/W/F. Renal consulted.   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  Disposition: Remain inpatient-transfer to telemetry  Antibiotics:  Anti-infectives    None     DVT Prophylaxis: SCD's  Code Status:  DNR-initially a full coder-"wanted to think about it"-but today consents to DNR.   Family Communication [name] [phone] number  Procedures:  None  CONSULTS:  GI and nephrology  Time spent 40 minutes-which includes 50% of the time with face-to-face with patient/ family and coordinating care related to the above assessment and plan.  MEDICATIONS: Scheduled Meds: . amiodarone  200 mg Oral Daily  . atorvastatin  40 mg Oral QHS  . [START ON 07/22/2014] calcitRIOL  0.5 mcg Oral Q M,W,F-HD  .  Chlorhexidine Gluconate Cloth  6 each Topical Q0600  . Darbepoetin Alfa      . [START ON 07/24/2014] darbepoetin (ARANESP) injection - DIALYSIS  100 mcg Intravenous Q Wed-HD  . insulin aspart  0-9 Units Subcutaneous TID WC  . loratadine  10 mg Oral Daily  . multivitamin  1 tablet Oral QHS  . mupirocin ointment  1 application Nasal BID  . pantoprazole (PROTONIX) IV  40 mg Intravenous Q12H  . sodium chloride  3 mL Intravenous Q12H  . sodium chloride  3 mL Intravenous Q12H   Continuous Infusions:  PRN Meds:.sodium chloride, acetaminophen **OR** acetaminophen, albuterol, guaiFENesin-dextromethorphan, loperamide, nitroGLYCERIN, ondansetron **OR** ondansetron (ZOFRAN) IV, sodium chloride    PHYSICAL EXAM: Vital signs in last 24 hours: Filed Vitals:   07/21/14 0800 07/21/14 0830 07/21/14 0900 07/21/14 0930  BP: 102/52 107/45 117/49 93/42  Pulse: 69 58 61 60  Temp:      TempSrc:      Resp: 21 18 18 19   Height:      Weight:      SpO2: 100% 100%      Weight change: 2.195 kg (4 lb 13.4 oz) Filed Weights   07/19/14 1439 07/21/14 0645  Weight: 54.205 kg (119 lb 8 oz) 56.4 kg (124 lb 5.4 oz)   Body mass index is 21.33 kg/(m^2).   Gen Exam: Awake and alert with clear speech.   Neck: Supple, No JVD.   Chest: B/L Clear.   CVS: S1 S2 Regular, +3/6 syst murmur Abdomen: soft, BS +, non tender, non distended.  Extremities: no edema, lower extremities warm to touch.  Neurologic: Non Focal.   Skin: No Rash.   Wounds: N/A.   Intake/Output from previous day:  Intake/Output Summary (Last 24 hours) at 07/21/14 1021 Last data filed at 07/20/14 1400  Gross per 24 hour  Intake    300 ml  Output      0 ml  Net    300 ml     LAB RESULTS: CBC  Recent Labs Lab 07/19/14 1839 07/19/14 2322 07/20/14 0437 07/21/14 0351  WBC 8.7 9.1 8.8 8.8  HGB 7.6* 7.4* 9.2* 9.0*  HCT 25.4* 24.6* 29.8* 29.0*  PLT 197 186 172 174  MCV 97.3 97.2 95.2 95.1  MCH 29.1 29.2 29.4 29.5  MCHC 29.9* 30.1  30.9 31.0  RDW 22.4* 22.2* 21.5* 21.6*    Chemistries   Recent Labs Lab 07/19/14 1839 07/20/14 0437 07/21/14 0351  NA 142 141 136*  K 3.7 5.1 5.0  CL 99 97 94*  CO2 29 26 25   GLUCOSE 128* 146* 173*  BUN 13 17 23   CREATININE 2.34* 2.81* 3.73*  CALCIUM 9.2 9.3 9.1    CBG:  Recent Labs Lab 07/19/14 2347 07/20/14 0755 07/20/14 1211 07/20/14 1614 07/20/14 2115  GLUCAP 177* 134* 197* 197* 252*    GFR Estimated Creatinine Clearance: 11.9 mL/min (by C-G formula based on Cr of 3.73).  Coagulation profile  Recent Labs Lab 07/19/14 1839  INR 1.19    Cardiac Enzymes No results for input(s): CKMB, TROPONINI, MYOGLOBIN in the last 168 hours.  Invalid input(s): CK  Invalid input(s): POCBNP No results for input(s): DDIMER in the last 72 hours. No results for input(s): HGBA1C in the last 72 hours. No results for input(s): CHOL, HDL, LDLCALC, TRIG, CHOLHDL, LDLDIRECT in the last 72 hours. No results for input(s): TSH, T4TOTAL, T3FREE, THYROIDAB in the last 72 hours.  Invalid input(s): FREET3 No results for input(s): VITAMINB12, FOLATE, FERRITIN, TIBC, IRON, RETICCTPCT in the last 72 hours. No results for input(s): LIPASE, AMYLASE in the last 72 hours.  Urine Studies No results for input(s): UHGB, CRYS in the last 72 hours.  Invalid input(s): UACOL, UAPR, USPG, UPH, UTP, UGL, UKET, UBIL, UNIT, UROB, ULEU, UEPI, UWBC, URBC, UBAC, CAST, UCOM, BILUA  MICROBIOLOGY: Recent Results (from the past 240 hour(s))  MRSA PCR Screening     Status: Abnormal   Collection Time: 07/19/14  3:34 PM  Result Value Ref Range Status   MRSA by PCR POSITIVE (A) NEGATIVE Final    Comment:        The GeneXpert MRSA Assay (FDA approved for NASAL specimens only), is one component of a comprehensive MRSA colonization surveillance program. It is not intended to diagnose MRSA infection nor to guide or monitor treatment for MRSA infections. RESULT CALLED TO, READ BACK BY AND VERIFIED  WITH: HOWELL RN  17:30 07/19/14 ([email or web address])   Clostridium Difficile by PCR     Status: None   Collection Time: 07/19/14 10:23 PM  Result Value Ref Range Status   C difficile by pcr NEGATIVE NEGATIVE Final    RADIOLOGY STUDIES/RESULTS: Dg Chest Port 1 View  07/19/2014   CLINICAL DATA:  Shortness of breath, history CHF, end-stage renal disease on dialysis, diabetes mellitus, hypertension, hyperlipidemia, COPD, coronary artery disease, paroxysmal atrial fibrillation, former smoker  EXAM: PORTABLE CHEST - 1 VIEW  COMPARISON:  Portable exam 1801 hr compared to 07/19/2014 at 1056 hr  FINDINGS: Enlargement of cardiac silhouette post CABG.  Pulmonary vascular congestion.  RIGHT pleural effusion and basilar atelectasis.  Central peribronchial thickening.  No gross pulmonary infiltrate, LEFT pleural effusion, or pneumothorax.  IMPRESSION: Enlargement of cardiac silhouette with pulmonary vascular congestion.  Bronchitic changes with persistent RIGHT pleural effusion and basilar atelectasis.   Electronically Signed   By: Ulyses SouthwardMark  Boles M.D.   On: 07/19/2014 18:23    Jeoffrey MassedGHIMIRE,Tyronza Happe, MD  [facility] Pager:[phone]  If 7PM-7AM, please contact night-coverage [email or web address] Password [email or web address] 07/21/2014, 10:21 AM   LOS: 2 days

## 2014-07-22 DIAGNOSIS — D62 Acute posthemorrhagic anemia: Secondary | ICD-10-CM | POA: Insufficient documentation

## 2014-07-22 LAB — RENAL FUNCTION PANEL
ANION GAP: 14 (ref 5–15)
Albumin: 2.9 g/dL — ABNORMAL LOW (ref 3.5–5.2)
BUN: 13 mg/dL (ref 6–23)
CHLORIDE: 98 meq/L (ref 96–112)
CO2: 26 mEq/L (ref 19–32)
Calcium: 9 mg/dL (ref 8.4–10.5)
Creatinine, Ser: 2.57 mg/dL — ABNORMAL HIGH (ref 0.50–1.10)
GFR calc Af Amer: 20 mL/min — ABNORMAL LOW (ref >90)
GFR calc non Af Amer: 18 mL/min — ABNORMAL LOW (ref >90)
GLUCOSE: 168 mg/dL — AB (ref 70–99)
PHOSPHORUS: 3 mg/dL (ref 2.3–4.6)
POTASSIUM: 4.2 meq/L (ref 3.7–5.3)
Sodium: 138 mEq/L (ref 137–147)

## 2014-07-22 LAB — CBC
HEMATOCRIT: 29.3 % — AB (ref 36.0–46.0)
Hemoglobin: 8.8 g/dL — ABNORMAL LOW (ref 12.0–15.0)
MCH: 29.2 pg (ref 26.0–34.0)
MCHC: 30 g/dL (ref 30.0–36.0)
MCV: 97.3 fL (ref 78.0–100.0)
Platelets: 174 10*3/uL (ref 150–400)
RBC: 3.01 MIL/uL — ABNORMAL LOW (ref 3.87–5.11)
RDW: 21 % — AB (ref 11.5–15.5)
WBC: 7.5 10*3/uL (ref 4.0–10.5)

## 2014-07-22 LAB — GLUCOSE, CAPILLARY
GLUCOSE-CAPILLARY: 230 mg/dL — AB (ref 70–99)
GLUCOSE-CAPILLARY: 128 mg/dL — AB (ref 70–99)
GLUCOSE-CAPILLARY: 147 mg/dL — AB (ref 70–99)
Glucose-Capillary: 183 mg/dL — ABNORMAL HIGH (ref 70–99)

## 2014-07-22 MED ORDER — DARBEPOETIN ALFA 100 MCG/0.5ML IJ SOSY
100.0000 ug | PREFILLED_SYRINGE | INTRAMUSCULAR | Status: AC
  Administered 2014-07-23: 100 ug via INTRAVENOUS
  Filled 2014-07-22: qty 0.5

## 2014-07-22 MED ORDER — DARBEPOETIN ALFA 100 MCG/0.5ML IJ SOSY: 100.0000 ug | PREFILLED_SYRINGE | INTRAMUSCULAR | Status: DC

## 2014-07-22 NOTE — Care Management Note (Signed)
CARE MANAGEMENT NOTE 07/22/2014  Patient:  [name], [name]   Account Number:  [id number]  Date Initiated:  07/22/2014  Documentation initiated by:  Dollye Glasser  Subjective/Objective Assessment:   CM following for progression and d/c planning. Pt lives alone and plans to return to home.     Action/Plan:   07/22/14 Met with pt who has Baker walker and canes, has used [place] for [facility] previoulys and wishes to use that agency again if needed.   Anticipated DC Date:  07/25/2014   Anticipated DC Plan:  [place]         Choice offered to / List presented to:             Status of service:  In process, will continue to follow Medicare Important Message given?  YES (If response is "NO", the following Medicare IM given date fields will be blank) Date Medicare IM given:  07/22/2014 Medicare IM given by:  Kimberely Mccannon Date Additional Medicare IM given:   Additional Medicare IM given by:    Discharge Disposition:    Per UR Regulation:    If discussed at Long Length of Stay Meetings, dates discussed:    Comments:

## 2014-07-22 NOTE — Progress Notes (Signed)
     [facility] Progress Note  Subjective:  Pt says she had BM earlier today that was normal colored. No abd pain. No N/V.     Objective:  Vital signs in last 24 hours: Temp:  [98.2 F (36.8 C)-99.6 F (37.6 C)] 98.3 F (36.8 C) (11/23 0910) Pulse Rate:  [55-59] 59 (11/23 0910) Resp:  [17-18] 17 (11/23 0910) BP: (97-132)/(40-68) 117/44 mmHg (11/23 0910) SpO2:  [96 %-100 %] 97 % (11/23 0910) Weight:  [118 lb 2.7 oz (53.6 kg)] 118 lb 2.7 oz (53.6 kg) (11/22 2154) Last BM Date: 07/20/14 (flexiseal) General:   Alert,  Well-developed, AA  in NAD Heart:  Regular rate and rhythm; no murmurs Pulm;lungs clear Abdomen:  Soft, nontender and nondistended. Normal bowel sounds, without guarding, and without rebound.   Extremities:  Without edema. Neurologic:  Alert and  oriented x4;  grossly normal neurologically. Psych:  Alert and cooperative. Normal mood and affect.  Intake/Output from previous day: 11/22 0701 - 11/23 0700 In: -  Out: 2500  Intake/Output this shift: Total I/O In: 240 [P.O.:240] Out: -   Lab Results:  Recent Labs  07/20/14 0437 07/21/14 0351 07/22/14 0540  WBC 8.8 8.8 7.5  HGB 9.2* 9.0* 8.8*  HCT 29.8* 29.0* 29.3*  PLT 172 174 174   BMET  Recent Labs  07/20/14 0437 07/21/14 0351 07/22/14 0540  NA 141 136* 138  K 5.1 5.0 4.2  CL 97 94* 98  CO2 26 25 26  GLUCOSE 146* 173* 168*  BUN 17 23 13  CREATININE 2.81* 3.73* 2.57*  CALCIUM 9.3 9.1 9.0   LFT  Recent Labs  07/22/14 0540  ALBUMIN 2.9*   PT/INR  Recent Labs  07/19/14 1839  LABPROT 15.3*  INR 1.19   Hepatitis Panel No results for input(s): HEPBSAG, HCVAB, HEPAIGM, HEPBIGM in the last 72 hours.  No results found.  ASSESSMENT/PLAN:   73 yo female admitted with GI bleed on plavix. Has been off plavix 4 days. Has hx h pylori neg duodenal ulcer in 2012.  Will plan on EGD tomorrow  07/23/14.     LOS: 3 days   Hvozdovic, Lori P PA-C 07/22/2014, Pager  [phone]  GI Attending Note  I have personally taken an interval history, reviewed the chart, and examined the patient.  I agree with the extender's note, impression and recommendations.  Robert D. Kaplan, MD, FACG [facility] [phone]  

## 2014-07-22 NOTE — Progress Notes (Signed)
[facility] Rounding Note  Subjective:  No complaints, no dyspnea, HD yesterday (on Holiday Schedule)  Objective: Vital signs in last 24 hours: Temp:  [98.1 F (36.7 C)-99.6 F (37.6 C)] 98.4 F (36.9 C) (11/23 0528) Pulse Rate:  [55-61] 55 (11/23 0528) Resp:  [18-19] 18 (11/23 0528) BP: (93-132)/(40-68) 132/46 mmHg (11/23 0528) SpO2:  [96 %-100 %] 96 % (11/23 0528) Weight:  [53.6 kg (118 lb 2.7 oz)-54.1 kg (119 lb 4.3 oz)] 53.6 kg (118 lb 2.7 oz) (11/22 2154) Weight change: -2.3 kg (-5 lb 1.1 oz)  Intake/Output from previous day: 11/22 0701 - 11/23 0700 In: -  Out: 2500  Intake/Output this shift:   EXAM: General appearance:  Alert, in no apparent distress  Resp:  CTA without rales, rhonchi, or wheezes Cardio:  RRR with Gr II/VI systolic murmur, no rub GI:  + BS, soft and nontender Extremities:  No edema Access:  AVF @ LUA with + bruit  Lab Results:  Recent Labs  07/21/14 0351 07/22/14 0540  WBC 8.8 7.5  HGB 9.0* 8.8*  HCT 29.0* 29.3*  PLT 174 174   BMET:  Recent Labs  07/21/14 0351 07/22/14 0540  NA 136* 138  K 5.0 4.2  CL 94* 98  CO2 25 26  GLUCOSE 173* 168*  BUN 23 13  CREATININE 3.73* 2.57*  CALCIUM 9.1 9.0  ALBUMIN 3.1* 2.9*   Medications . amiodarone  200 mg Oral Daily  . atorvastatin  40 mg Oral QHS  . calcitRIOL  0.5 mcg Oral Q M,W,F-HD  . Chlorhexidine Gluconate Cloth  6 each Topical Q0600  . [START ON 07/23/2014] darbepoetin (ARANESP) injection - DIALYSIS  100 mcg Intravenous Q Tue-HD  . [START ON 07/31/2014] darbepoetin (ARANESP) injection - DIALYSIS  100 mcg Intravenous Q Wed-HD  . insulin aspart  0-9 Units Subcutaneous TID WC  . loratadine  10 mg Oral Daily  . multivitamin  1 tablet Oral QHS  . mupirocin ointment  1 application Nasal BID  . pantoprazole (PROTONIX) IV  40 mg Intravenous Q12H  . sodium chloride  3 mL Intravenous Q12H  . sodium chloride  3 mL Intravenous Q12H   HD: MWF [facility] 4hr 30 mins 51.5kgs  2K/2.25ca No Heparin L AVF 160 350/1.5  Calcitriol 0.5 mcg Aranesp 100  Profile 4  Assessment/Plan: 1. Melena - Hx duodenal ulcer, ASA/Plavix dc'd, continues PPI; EGD tomorrow 11/24. 2. ESRD - HD on MWF @ [facility], K 4.2.  HD tomorrow per holiday schedule.No heparin 3. HTN/Volume - BP 132/46, no meds; wt 53.6 kg s/p net UF 2.5 L yesterday. 4. Anemia - Hgb 8.8 s/p 1 U PRBCs 11/21; Aranesp 100 mcg on Wed. 5. 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 6. Nutrition - Alb 2.9, carb-mod renal diet, vitamin. 7. Hx A-fib - on Amiodarone. 8. DM - per primary.   LOS: 3 days   LYLES,CHARLES 07/22/2014,8:24 AM  I have seen and examined this patient and agree with plan and assessment outlined in the above note with highlighted additions. Kassem Kibbe B,MD 07/22/2014 12:39 PM

## 2014-07-22 NOTE — Progress Notes (Signed)
PATIENT DETAILS Name: [name] Age: 73 y.o. Sex: female Date of Birth: 10-27-1940 Admit Date: 07/19/2014 Admitting Physician Joseph Art, DO ZOX:[id number], Myrene Galas, MD  Subjective: No major complaints.No further melena  Assessment/Plan: Principal Problem:   Suspect UGI Bleed:Continue PPI, Transfuse 1 unit of PRBC on day of admit. Has prior hx of Duodenal Ulcer.Stopped ASA/Plavix for now. GI planning EGD after Plavix washout-scheduled for 11/24.  Active Problems:   Acute Blood loss anemia: has acute blood loss anemia on top of anemia of chronic kidney disease. Hb dropped to 7.4 on admission, given one unit of PRBC. Hb stable at 8.8 today.Continue to follow CBC and transfuse prn.   Diarrhea:C Diff PCR negative. Supportive care-Imodium prn.    Paroxysmal atrial fibrillation: Continue amiodarone, not on Coumadin because of recent PCI and on dual antiplatelet agents. Monitor in telemetry.    ESRD (end stage renal disease): On hemodialysis M/W/F. Renal consulted.   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  Disposition: Remain inpatient-suspect home when work up complete  Antibiotics:  Anti-infectives    None     DVT Prophylaxis: SCD's  Code Status:  DNR-initially a full coder-"wanted to think about it"-but eventually consented to DNR.   Family Communication [name] [phone] number  Procedures:  None  CONSULTS:  GI and nephrology   MEDICATIONS: Scheduled Meds: . amiodarone  200 mg Oral Daily  . atorvastatin  40 mg Oral QHS  . calcitRIOL  0.5 mcg Oral Q M,W,F-HD  . Chlorhexidine Gluconate Cloth  6 each Topical Q0600  . [START ON 07/23/2014] darbepoetin (ARANESP) injection - DIALYSIS   100 mcg Intravenous Q Tue-HD  . [START ON 07/31/2014] darbepoetin (ARANESP) injection - DIALYSIS  100 mcg Intravenous Q Wed-HD  . insulin aspart  0-9 Units Subcutaneous TID WC  . loratadine  10 mg Oral Daily  . multivitamin  1 tablet Oral QHS  . mupirocin ointment  1 application Nasal BID  . pantoprazole (PROTONIX) IV  40 mg Intravenous Q12H  . sodium chloride  3 mL Intravenous Q12H  . sodium chloride  3 mL Intravenous Q12H   Continuous Infusions:  PRN Meds:.sodium chloride, sodium chloride, sodium chloride, acetaminophen **OR** acetaminophen, albuterol, feeding supplement (NEPRO CARB STEADY), guaiFENesin-dextromethorphan, lidocaine (PF), lidocaine-prilocaine, loperamide, nitroGLYCERIN, ondansetron **OR** ondansetron (ZOFRAN) IV, pentafluoroprop-tetrafluoroeth, sodium chloride    PHYSICAL EXAM: Vital signs in last 24 hours: Filed Vitals:   07/21/14 1749 07/21/14 2154 07/22/14 0528 07/22/14 0910  BP: 97/40 113/42 132/46 117/44  Pulse: 56 57 55 59  Temp: 99.6 F (37.6 C) 98.6 F (37 C) 98.4 F (36.9 C) 98.3 F (36.8 C)  TempSrc: Oral Oral Oral Oral  Resp: 18 18 18 17   Height:      Weight:  53.6 kg (118 lb 2.7 oz)    SpO2: 100% 97% 96% 97%    Weight change: -2.3 kg (-5 lb 1.1 oz) Filed Weights   07/21/14 0645 07/21/14 1100 07/21/14 2154  Weight: 56.4 kg (124 lb 5.4 oz) 54.1 kg (119 lb 4.3 oz) 53.6 kg (118 lb 2.7 oz)   Body mass index is 20.27 kg/(m^2).   Gen Exam: Awake and alert with clear speech.   Neck: Supple, No JVD.   Chest: B/L Clear.  No rales CVS: S1 S2 Regular, +3/6 syst murmur Abdomen: soft, BS +, non tender, non distended.  Extremities: no edema, lower extremities warm to touch. Neurologic: Non Focal.   Skin: No Rash.   Wounds: N/A.   Intake/Output from previous day:  Intake/Output Summary (Last 24 hours) at 07/22/14 1212 Last data filed at 07/22/14 0910  Gross per 24 hour  Intake    240 ml  Output      0 ml  Net    240 ml     LAB  RESULTS: CBC  Recent Labs Lab 07/19/14 1839 07/19/14 2322 07/20/14 0437 07/21/14 0351 07/22/14 0540  WBC 8.7 9.1 8.8 8.8 7.5  HGB 7.6* 7.4* 9.2* 9.0* 8.8*  HCT 25.4* 24.6* 29.8* 29.0* 29.3*  PLT 197 186 172 174 174  MCV 97.3 97.2 95.2 95.1 97.3  MCH 29.1 29.2 29.4 29.5 29.2  MCHC 29.9* 30.1 30.9 31.0 30.0  RDW 22.4* 22.2* 21.5* 21.6* 21.0*    Chemistries   Recent Labs Lab 07/19/14 1839 07/20/14 0437 07/21/14 0351 07/22/14 0540  NA 142 141 136* 138  K 3.7 5.1 5.0 4.2  CL 99 97 94* 98  CO2 29 26 25 26   GLUCOSE 128* 146* 173* 168*  BUN 13 17 23 13   CREATININE 2.34* 2.81* 3.73* 2.57*  CALCIUM 9.2 9.3 9.1 9.0    CBG:  Recent Labs Lab 07/21/14 1213 07/21/14 1651 07/21/14 2144 07/22/14 0749 07/22/14 1121  GLUCAP 80 234* 155* 183* 230*    GFR Estimated Creatinine Clearance: 17 mL/min (by C-G formula based on Cr of 2.57).  Coagulation profile  Recent Labs Lab 07/19/14 1839  INR 1.19    Cardiac Enzymes No results for input(s): CKMB, TROPONINI, MYOGLOBIN in the last 168 hours.  Invalid input(s): CK  Invalid input(s): POCBNP No results for input(s): DDIMER in the last 72 hours. No results for input(s): HGBA1C in the last 72 hours. No results for input(s): CHOL, HDL, LDLCALC, TRIG, CHOLHDL, LDLDIRECT in the last 72 hours. No results for input(s): TSH, T4TOTAL, T3FREE, THYROIDAB in the last 72 hours.  Invalid input(s): FREET3 No results for input(s): VITAMINB12, FOLATE, FERRITIN, TIBC, IRON, RETICCTPCT in the last 72 hours. No results for input(s): LIPASE, AMYLASE in the last 72 hours.  Urine Studies No results for input(s): UHGB, CRYS in the last 72 hours.  Invalid input(s): UACOL, UAPR, USPG, UPH, UTP, UGL, UKET, UBIL, UNIT, UROB, ULEU, UEPI, UWBC, URBC, UBAC, CAST, UCOM, BILUA  MICROBIOLOGY: Recent Results (from the past 240 hour(s))  MRSA PCR Screening     Status: Abnormal   Collection Time: 07/19/14  3:34 PM  Result Value Ref Range Status    MRSA by PCR POSITIVE (A) NEGATIVE Final    Comment:        The GeneXpert MRSA Assay (FDA approved for NASAL specimens only), is one component of a comprehensive MRSA colonization surveillance program. It is not intended to diagnose MRSA infection nor to guide or monitor treatment for MRSA infections. RESULT CALLED TO, READ BACK BY AND VERIFIED WITH: HOWELL RN  17:30 07/19/14 ([email or web address])   Clostridium Difficile by PCR     Status: None   Collection Time: 07/19/14 10:23 PM  Result Value Ref Range Status   C difficile by pcr NEGATIVE NEGATIVE Final    RADIOLOGY STUDIES/RESULTS: Dg Chest Port 1 View  07/19/2014   CLINICAL DATA:  Shortness of breath, history CHF, end-stage renal disease on dialysis, diabetes mellitus, hypertension, hyperlipidemia, COPD, coronary artery disease, paroxysmal atrial fibrillation,  former smoker  EXAM: PORTABLE CHEST - 1 VIEW  COMPARISON:  Portable exam 1801 hr compared to 07/19/2014 at 1056 hr  FINDINGS: Enlargement of cardiac silhouette post CABG.  Pulmonary vascular congestion.  RIGHT pleural effusion and basilar atelectasis.  Central peribronchial thickening.  No gross pulmonary infiltrate, LEFT pleural effusion, or pneumothorax.  IMPRESSION: Enlargement of cardiac silhouette with pulmonary vascular congestion.  Bronchitic changes with persistent RIGHT pleural effusion and basilar atelectasis.   Electronically Signed   By: Ulyses SouthwardMark  Boles M.D.   On: 07/19/2014 18:23    Jeoffrey MassedGHIMIRE,SHANKER, MD  [facility] Pager:[phone]  If 7PM-7AM, please contact night-coverage [email or web address] Password [email or web address] 07/22/2014, 12:12 PM   LOS: 3 days

## 2014-07-22 NOTE — Evaluation (Signed)
Physical Therapy Evaluation Patient Details Name: Cassie Paiseggy A Dinneen MRN: [id number] DOB: [birth year] Today's Date: 07/22/2014   History of Present Illness  [name] is a 73 y.o. female with a Past Medical History of end-stage renal disease on dialysis M/W/F, severe aortic stenosis, coronary artery disease status post last PCI in 06/2013. Pt was at hemodialysis today when she was noted to have a large melanotic stool. She became briefly hypotensive as well, is given IV fluid boluses, and [facility] . Per chart, pt poor historian.  Clinical Impression  Pt admitted with generalized weakness after 3 days in bed. Pt currently with functional limitations due to the deficits listed below (see PT Problem List). Pt ambulated 20' with min A and RW and could not tolerate further ambulation due to fatigue and decreased O2 sats to 85%, O2 reapplied 2L. Pt will benefit from skilled PT to increase their independence and safety with mobility to allow discharge to the venue listed below. PT will continue to follow.       Follow Up Recommendations Home health PT;Supervision/Assistance - 24 hour    Equipment Recommendations  None recommended by PT    Recommendations for Other Services OT consult     Precautions / Restrictions Precautions Precautions: Fall Restrictions Weight Bearing Restrictions: No      Mobility  Bed Mobility Overal bed mobility: Needs Assistance Bed Mobility: Supine to Sit;Sit to Supine     Supine to sit: Min assist Sit to supine: Min assist   General bed mobility comments: needs min A to get legs fully off bed and then to elevate trunk from bed. Min A to scoot over in bed with return to supine, partially due to disomfort of rectal tube  Transfers Overall transfer level: Needs assistance Equipment used: Rolling walker (2 wheeled) Transfers: Sit to/from Stand Sit to Stand: Min assist         General transfer comment: needed min A for  power up from bed and recliner  Ambulation/Gait Ambulation/Gait assistance: Min assist Ambulation Distance (Feet): 20 Feet Assistive device: Rolling walker (2 wheeled) Gait Pattern/deviations: Step-through pattern;Decreased stride length;Trunk flexed Gait velocity: decreased   General Gait Details: pt slightly dizzy with initial standing, improved with time up, though pt began to fatigue very quickly and felt that she may pass out  Stairs            Wheelchair Mobility    Modified Rankin (Stroke Patients Only)       Balance Overall balance assessment: Needs assistance Sitting-balance support: Bilateral upper extremity supported;Feet supported Sitting balance-Leahy Scale: Fair     Standing balance support: Bilateral upper extremity supported;During functional activity Standing balance-Leahy Scale: Poor                               Pertinent Vitals/Pain Pain Assessment: Faces Faces Pain Scale: Hurts even more Pain Location: rectal tube insertion Pain Intervention(s): Monitored during session;Repositioned  O2 sats 85% on RA with ambulation, 2L reapplied after session    Home Living Family/patient expects to be discharged to:: Private residence Living Arrangements: Alone Available Help at Discharge: Family;Available PRN/intermittently Type of Home: House Home Access: Stairs to enter Entrance Stairs-Rails: Doctor, general practiceight;Left Entrance Stairs-Number of Steps: 5-7 Home Layout: One level Home Equipment: Walker - 4 wheels;Shower seat Additional Comments: pt reports her mother and son check on her intermittently though last admission was reported that she had 24hr assist from friend. Was  also recently receiving HHPT and HHRN    Prior Function Level of Independence: Needs assistance   Gait / Transfers Assistance Needed: ambulates with cane or rollator. Needs assist for stairs in and out of house           Hand Dominance   Dominant Hand: Right     Extremity/Trunk Assessment   Upper Extremity Assessment: Generalized weakness           Lower Extremity Assessment: Generalized weakness      Cervical / Trunk Assessment: Kyphotic  Communication   Communication: No difficulties  Cognition Arousal/Alertness: Awake/alert Behavior During Therapy: WFL for tasks assessed/performed Overall Cognitive Status: Within Functional Limits for tasks assessed                      General Comments General comments (skin integrity, edema, etc.): attempted to let pt sit up in chair but was too uncomfortable with rectal tube so helped her back into bed and onto her side    Exercises        Assessment/Plan    PT Assessment Patient needs continued PT services  PT Diagnosis Difficulty walking;Generalized weakness;Acute pain;Abnormality of gait   PT Problem List Decreased strength;Decreased activity tolerance;Decreased balance;Decreased mobility;Cardiopulmonary status limiting activity;Pain  PT Treatment Interventions DME instruction;Gait training;Stair training;Functional mobility training;Therapeutic activities;Therapeutic exercise;Balance training;Patient/family education   PT Goals (Current goals can be found in the Care Plan section) Acute Rehab PT Goals Patient Stated Goal: return home PT Goal Formulation: With patient Time For Goal Achievement: 08/05/14 Potential to Achieve Goals: Good    Frequency Min 3X/week   Barriers to discharge Decreased caregiver support      Co-evaluation               End of Session Equipment Utilized During Treatment: Gait belt Activity Tolerance: Patient limited by fatigue Patient left: in bed;with call bell/phone within reach;with bed alarm set Nurse Communication: Mobility status         Time: [id number] PT Time Calculation (min) (ACUTE ONLY): 26 min   Charges:   PT Evaluation $Initial PT Evaluation Tier I: 1 Procedure PT Treatments $Gait Training: 8-22 mins $Therapeutic  Activity: 8-22 mins   PT G Codes:         Lyanne CoVictoria Finneus Kaneshiro, PT  Acute Rehab Services  [phone]  [place], [place] 07/22/2014, 2:22 PM

## 2014-07-22 NOTE — Plan of Care (Signed)
Problem: Phase II Progression Outcomes Goal: IV changed to normal saline lock Outcome: Completed/Met Date Met:  07/22/14

## 2014-07-23 ENCOUNTER — Encounter (HOSPITAL_COMMUNITY)
Admission: AD | Disposition: A | Discharge status: Skilled Nursing Facility | Payer: Self-pay | Source: Other Acute Inpatient Hospital | Attending: Internal Medicine

## 2014-07-23 ENCOUNTER — Encounter (HOSPITAL_COMMUNITY): Payer: Self-pay

## 2014-07-23 DIAGNOSIS — R0602 Shortness of breath: Secondary | ICD-10-CM

## 2014-07-23 DIAGNOSIS — K2951 Unspecified chronic gastritis with bleeding: Secondary | ICD-10-CM

## 2014-07-23 HISTORY — PX: ESOPHAGOGASTRODUODENOSCOPY: SHX5428

## 2014-07-23 LAB — CBC
HCT: 33.7 % — ABNORMAL LOW (ref 36.0–46.0)
HEMOGLOBIN: 10.2 g/dL — AB (ref 12.0–15.0)
MCH: 29.5 pg (ref 26.0–34.0)
MCHC: 30.3 g/dL (ref 30.0–36.0)
MCV: 97.4 fL (ref 78.0–100.0)
Platelets: 173 10*3/uL (ref 150–400)
RBC: 3.46 MIL/uL — AB (ref 3.87–5.11)
RDW: 20.2 % — ABNORMAL HIGH (ref 11.5–15.5)
WBC: 8.5 10*3/uL (ref 4.0–10.5)

## 2014-07-23 LAB — GLUCOSE, CAPILLARY
GLUCOSE-CAPILLARY: 184 mg/dL — AB (ref 70–99)
Glucose-Capillary: 94 mg/dL (ref 70–99)
Glucose-Capillary: 77 mg/dL (ref 70–99)

## 2014-07-23 LAB — RENAL FUNCTION PANEL
Albumin: 3 g/dL — ABNORMAL LOW (ref 3.5–5.2)
Anion gap: 17 — ABNORMAL HIGH (ref 5–15)
BUN: 23 mg/dL (ref 6–23)
CHLORIDE: 93 meq/L — AB (ref 96–112)
CO2: 23 meq/L (ref 19–32)
Calcium: 9.1 mg/dL (ref 8.4–10.5)
Creatinine, Ser: 3.79 mg/dL — ABNORMAL HIGH (ref 0.50–1.10)
GFR, EST AFRICAN AMERICAN: 13 mL/min — AB (ref >90)
GFR, EST NON AFRICAN AMERICAN: 11 mL/min — AB (ref >90)
Glucose, Bld: 184 mg/dL — ABNORMAL HIGH (ref 70–99)
Phosphorus: 4.7 mg/dL — ABNORMAL HIGH (ref 2.3–4.6)
Potassium: 5.1 mEq/L (ref 3.7–5.3)
SODIUM: 133 meq/L — AB (ref 137–147)

## 2014-07-23 SURGERY — EGD (ESOPHAGOGASTRODUODENOSCOPY)
Anesthesia: Moderate Sedation

## 2014-07-23 MED ORDER — FLUMAZENIL 0.5 MG/5ML IV SOLN
INTRAVENOUS | Status: AC
Start: 2014-07-23 — End: 2014-07-23
  Filled 2014-07-23: qty 5

## 2014-07-23 MED ORDER — FLUMAZENIL 0.5 MG/5ML IV SOLN
INTRAVENOUS | Status: DC | PRN
  Administered 2014-07-23: 0.2 mg via INTRAVENOUS

## 2014-07-23 MED ORDER — DARBEPOETIN ALFA 100 MCG/0.5ML IJ SOSY
PREFILLED_SYRINGE | INTRAMUSCULAR | Status: AC
  Filled 2014-07-23: qty 0.5

## 2014-07-23 MED ORDER — FENTANYL CITRATE 0.05 MG/ML IJ SOLN
INTRAMUSCULAR | Status: AC
  Filled 2014-07-23: qty 2

## 2014-07-23 MED ORDER — BUTAMBEN-TETRACAINE-BENZOCAINE 2-2-14 % EX AERO
INHALATION_SPRAY | CUTANEOUS | Status: DC | PRN
Start: 2014-07-23 — End: 2014-07-23
  Administered 2014-07-23: 2 via TOPICAL

## 2014-07-23 MED ORDER — CETYLPYRIDINIUM CHLORIDE 0.05 % MT LIQD
7.0000 mL | Freq: Two times a day (BID) | OROMUCOSAL | Status: DC
  Administered 2014-07-24 – 2014-07-26 (×4): 7 mL via OROMUCOSAL

## 2014-07-23 MED ORDER — MIDAZOLAM HCL 5 MG/ML IJ SOLN
INTRAMUSCULAR | Status: AC
  Filled 2014-07-23: qty 2

## 2014-07-23 MED ORDER — MIDAZOLAM HCL 5 MG/5ML IJ SOLN
INTRAMUSCULAR | Status: DC | PRN
  Administered 2014-07-23: 2 mg via INTRAVENOUS

## 2014-07-23 MED ORDER — FENTANYL CITRATE 0.05 MG/ML IJ SOLN
INTRAMUSCULAR | Status: DC | PRN
  Administered 2014-07-23: 25 ug via INTRAVENOUS

## 2014-07-23 NOTE — Op Note (Addendum)
[facility] [address] [place], [id number]   ENDOSCOPY PROCEDURE REPORT  PATIENT: Cassie PaisBrady, Iceis A  MR#: [id number] BIRTHDATE: 1941-08-02 , 73  yrs. old GENDER: female ENDOSCOPIST: Louis Meckelobert D Nyjah Denio, MD REFERRED BY: PROCEDURE DATE:  07/23/2014 PROCEDURE:  EGD w/ biopsy ASA CLASS:     Class III INDICATIONS:  melena. MEDICATIONS: Fentanyl 25 mcg IV, Versed 2 mg IV, and Flumazenil (Romazicon) 0.2 mg IV TOPICAL ANESTHETIC: Cetacaine Spray  DESCRIPTION OF PROCEDURE: After the risks benefits and alternatives of the procedure were thoroughly explained, informed consent was obtained.  The Pentax Gastroscope [id number] endoscope was introduced through the mouth and advanced to the second portion of the duodenum , Without limitations.  The instrument was slowly withdrawn as the mucosa was fully examined.    STOMACH: Moderate chronic gastritis (inflammation) was found in the gastric body.  Multiple biopsies were performed using cold forceps. Except for the findings listed the EGD was otherwise normal. Retroflexed views revealed no abnormalities.     The scope was then withdrawn from the patient and the procedure completed.  COMPLICATIONS: Hypoxemia occurred .  Transient versed with Romazicon   ENDOSCOPIC IMPRESSION: 1.   Chronic gastritis (inflammation) was found in the gastric body; multiple biopsies were performed 2.   EGD was otherwise normal  findings may explain source for GI bleeding, especially in the face of Plavix  RECOMMENDATIONS: 1.  Await biopsy results 2.  Continue PPI 3.  may carefully resume Plavix  REPEAT EXAM:  eSigned:  Louis Meckelobert D Paiden Caraveo, MD 07/23/2014 4:11 PM Revised: 07/23/2014 4:11 PM   CC: Angelina PihImran Hague, MD

## 2014-07-23 NOTE — Procedures (Signed)
I have personally attended this patient's dialysis session.   2K bath pending labs No heparin Left AVF cannulated - BFR 350 BP 120's Goal 2.5 kg  Cassie Balynthia Jassmine Vandruff, MD [facility] [phone] Pager 07/23/2014, 8:26 AM

## 2014-07-23 NOTE — Interval H&P Note (Signed)
History and Physical Interval Note:  07/23/2014 3:49 PM  [name]  has presented today for surgery, with the diagnosis of melena, anemia  The various methods of treatment have been discussed with the patient and family. After consideration of risks, benefits and other options for treatment, the patient has consented to  Procedure(s): ESOPHAGOGASTRODUODENOSCOPY (EGD) (N/A) as a surgical intervention .  The patient's history has been reviewed, patient examined, no change in status, stable for surgery.  I have reviewed the patient's chart and labs.  Questions were answered to the patient's satisfaction.    The recent H&P (dated *07/22/14**) was reviewed, the patient was examined and there is no change in the patients condition since that H&P was completed.   Cassie HeapsRobert Kaplan  07/23/2014, 3:49 PM    Cassie Heapsobert Kaplan

## 2014-07-23 NOTE — Progress Notes (Signed)
Endo called and stated had to reverse procedure, pt did not do well.  Called pt's home phone and spoke with son [name] and advised was moving pt to 2H-20, pt's brother emergency contact, but son stated he was incarcerated and would advise brother.  Paged Dr. Pasco BingGhimire FYI.

## 2014-07-23 NOTE — H&P (View-Only) (Signed)
     [facility] Progress Note  Subjective:  Pt says she had BM earlier today that was normal colored. No abd pain. No N/V.     Objective:  Vital signs in last 24 hours: Temp:  [98.2 F (36.8 C)-99.6 F (37.6 C)] 98.3 F (36.8 C) (11/23 0910) Pulse Rate:  [55-59] 59 (11/23 0910) Resp:  [17-18] 17 (11/23 0910) BP: (97-132)/(40-68) 117/44 mmHg (11/23 0910) SpO2:  [96 %-100 %] 97 % (11/23 0910) Weight:  [118 lb 2.7 oz (53.6 kg)] 118 lb 2.7 oz (53.6 kg) (11/22 2154) Last BM Date: 07/20/14 (flexiseal) General:   Alert,  Well-developed, AA  in NAD Heart:  Regular rate and rhythm; no murmurs Pulm;lungs clear Abdomen:  Soft, nontender and nondistended. Normal bowel sounds, without guarding, and without rebound.   Extremities:  Without edema. Neurologic:  Alert and  oriented x4;  grossly normal neurologically. Psych:  Alert and cooperative. Normal mood and affect.  Intake/Output from previous day: 11/22 0701 - 11/23 0700 In: -  Out: 2500  Intake/Output this shift: Total I/O In: 240 [P.O.:240] Out: -   Lab Results:  Recent Labs  07/20/14 0437 07/21/14 0351 07/22/14 0540  WBC 8.8 8.8 7.5  HGB 9.2* 9.0* 8.8*  HCT 29.8* 29.0* 29.3*  PLT 172 174 174   BMET  Recent Labs  07/20/14 0437 07/21/14 0351 07/22/14 0540  NA 141 136* 138  K 5.1 5.0 4.2  CL 97 94* 98  CO2 26 25 26   GLUCOSE 146* 173* 168*  BUN 17 23 13   CREATININE 2.81* 3.73* 2.57*  CALCIUM 9.3 9.1 9.0   LFT  Recent Labs  07/22/14 0540  ALBUMIN 2.9*   PT/INR  Recent Labs  07/19/14 1839  LABPROT 15.3*  INR 1.19   Hepatitis Panel No results for input(s): HEPBSAG, HCVAB, HEPAIGM, HEPBIGM in the last 72 hours.  No results found.  ASSESSMENT/PLAN:   73 yo female admitted with GI bleed on plavix. Has been off plavix 4 days. Has hx h pylori neg duodenal ulcer in 2012.  Will plan on EGD tomorrow  07/23/14.     LOS: 3 days   Hvozdovic, Moise BoringLori P PA-C 07/22/2014, Pager  [phone]  GI Attending Note  I have personally taken an interval history, reviewed the chart, and examined the patient.  I agree with the extender's note, impression and recommendations.  Barbette Hairobert D. Arlyce DiceKaplan, MD, [facility]  Gastroenterology [phone]

## 2014-07-23 NOTE — Progress Notes (Signed)
Subjective:   NPO for EGD, quiet, not talking, but denies any complaints not going for EGD until later today so OK'd to go ahead with dialysis this morining. She is somewhat somnolent but after replacing her oxygen (which she wears at home) she woke up nicely  Objective: Vital signs in last 24 hours: Temp:  [98.3 F (36.8 C)-98.8 F (37.1 C)] 98.8 F (37.1 C) (11/23 1725) Pulse Rate:  [56-59] 56 (11/23 1725) Resp:  [17-18] 18 (11/23 1725) BP: (117-132)/(44-48) 132/48 mmHg (11/23 1725) SpO2:  [97 %] 97 % (11/23 1725) Weight change:   Intake/Output from previous day: 11/23 0701 - 11/24 0700 In: 720 [P.O.:720] Out: -  Intake/Output this shift:    EXAM: General appearance: Alert, in no apparent distress Initially somnolent - when O2 resumed woke up and was quite appropriate Resp: CTA without rales, rhonchi, or wheezes Cardio: RRR with Gr II/VI systolic murmur, no rub GI: + BS, soft and nontender Extremities: No edema Access: AVF @ LUA with + bruit - cannulated with BFR of 350  Lab Results:  Recent Labs  07/22/14 0540 07/23/14 0629  WBC 7.5 8.5  HGB 8.8* 10.2*  HCT 29.3* 33.7*  PLT 174 173   BMET:  Recent Labs  07/21/14 0351 07/22/14 0540  NA 136* 138  K 5.0 4.2  CL 94* 98  CO2 25 26  GLUCOSE 173* 168*  BUN 23 13  CREATININE 3.73* 2.57*  CALCIUM 9.1 9.0  ALBUMIN 3.1* 2.9*   Scheduled Medications . amiodarone  200 mg Oral Daily  . atorvastatin  40 mg Oral QHS  . calcitRIOL  0.5 mcg Oral Q M,W,F-HD  . Chlorhexidine Gluconate Cloth  6 each Topical Q0600  . darbepoetin (ARANESP) injection - DIALYSIS  100 mcg Intravenous Q Tue-HD  . [START ON 07/31/2014] darbepoetin (ARANESP) injection - DIALYSIS  100 mcg Intravenous Q Wed-HD  . insulin aspart  0-9 Units Subcutaneous TID WC  . loratadine  10 mg Oral Daily  . multivitamin  1 tablet Oral QHS  . mupirocin ointment  1 application Nasal BID  . pantoprazole (PROTONIX) IV  40 mg Intravenous Q12H  . sodium  chloride  3 mL Intravenous Q12H  . sodium chloride  3 mL Intravenous Q12H    HD: MWF [facility] 4hr 30 mins 51.5kgs 2K/2.25ca No Heparin L AVF 160 350/1.5  Calcitriol 0.5 mcg Aranesp 100  Profile 4  Assessment/Plan: 1. Melena - Hx duodenal ulcer, ASA/Plavix dc'd, continues PPI; EGD pending today. 2. ESRD - HD on MWF @ [facility], K 4.2, no Heparin. HD today per holiday schedule. 3. HTN/Volume - BP 132/46, no meds; wt 53.6 kg s/p net UF 2.5 L yesterday. 4. Anemia - Hgb 10.2 yesterday. s/p 1 U PRBCs 11/21; Aranesp 100 mcg on Wed (today per holiday schedule). 5. 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 Will need to get her oral calcitriol today with HD 6. Nutrition - Alb 2.9, carb-mod renal diet, vitamin. 7. Hx A-fib - on Amiodarone. Bradycardia in the 50's coming into HD 8. DM - per primary. 9. Severe inoperable AS/AF/CAD 10. Severe COPD O2 dependent - came into HD without her O2 with low sats - perked up considerably after O2 resumed    LOS: 4 days   LYLES,CHARLES 07/23/2014,7:10 AM  I have seen and examined this patient and agree with plan and assessment as outlined in the above note with highlighted additiosn. Dialysis today. EGD afterwards. Labs are all pending. Pt  wears oxygen at home and needs to wear all the time (was not on when came into HD today, sats were low).  BP will determine how much volume we can get today.  Try for 2.5 liters off today as BP tolerates Hibah Odonnell B,MD 07/23/2014 8:13 AM

## 2014-07-23 NOTE — Progress Notes (Signed)
Pt  Tele Rattigan down to 41 this am but back up to 50's.  Paged Dr. [place] BingGhimire FYI and pt to have dialysis this am and EDG this afternoon per endo.

## 2014-07-23 NOTE — Progress Notes (Signed)
PATIENT DETAILS Name: [name] Age: 73 y.o. Sex: female Date of Birth: 01-28-1941 Admit Date: 07/19/2014 Admitting Physician Joseph Art, DO WUJ:WJXBJ, Myrene Galas, MD  Subjective: No major complaints.awake and alert during my evaluation this morning-was apparently slightly lethargic earlier.  Assessment/Plan: Principal Problem:   Suspect UGI Bleed:Continue PPI, Transfuse 1 unit of PRBC on day of admit. Has prior hx of Duodenal Ulcer.Stopped ASA/Plavix for now. GI consulted,  EGD after Plavix washout-scheduled for 11/24.  Active Problems:   Acute Blood loss anemia: has acute blood loss anemia on top of anemia of chronic kidney disease. Hb dropped to 7.4 on admission, given one unit of PRBC. Hb remained stable posttransfusion. Continue to follow CBC and transfuse prn.   Diarrhea:C Diff PCR negative. Supportive care-Imodium prn.    Paroxysmal atrial fibrillation: Continue amiodarone, not on Coumadin because of recent PCI and on dual antiplatelet agents and GI bleed. Monitor in telemetry.    ESRD (end stage renal disease): On hemodialysis M/W/F. Renal consulted.   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 Will need to discuss with cardiology regarding long-term antiplatelet agents.  Disposition: Remain inpatient-suspect home when work up complete  Antibiotics:  Anti-infectives    None     DVT Prophylaxis: SCD's  Code Status:  DNR-initially a full coder-"wanted to think about it"-but eventually consented to DNR.   Family Communication [name] [phone] number  Procedures:  None  CONSULTS:  GI and nephrology   MEDICATIONS: Scheduled Meds: . amiodarone  200 mg Oral Daily  . atorvastatin   40 mg Oral QHS  . calcitRIOL  0.5 mcg Oral Q M,W,F-HD  . Chlorhexidine Gluconate Cloth  6 each Topical Q0600  . Darbepoetin Alfa      . [START ON 07/31/2014] darbepoetin (ARANESP) injection - DIALYSIS  100 mcg Intravenous Q Wed-HD  . insulin aspart  0-9 Units Subcutaneous TID WC  . loratadine  10 mg Oral Daily  . multivitamin  1 tablet Oral QHS  . mupirocin ointment  1 application Nasal BID  . pantoprazole (PROTONIX) IV  40 mg Intravenous Q12H  . sodium chloride  3 mL Intravenous Q12H  . sodium chloride  3 mL Intravenous Q12H   Continuous Infusions:  PRN Meds:.sodium chloride, sodium chloride, sodium chloride, acetaminophen **OR** acetaminophen, albuterol, feeding supplement (NEPRO CARB STEADY), guaiFENesin-dextromethorphan, lidocaine (PF), lidocaine-prilocaine, loperamide, nitroGLYCERIN, ondansetron **OR** ondansetron (ZOFRAN) IV, pentafluoroprop-tetrafluoroeth, sodium chloride    PHYSICAL EXAM: Vital signs in last 24 hours: Filed Vitals:   07/23/14 0900 07/23/14 0930 07/23/14 1000 07/23/14 1030  BP: 97/43 102/43 120/68 125/50  Pulse: 62 62 66 66  Temp:      TempSrc:      Resp: 16 15 20 19   Height:      Weight:      SpO2:        Weight change: -0.07 kg (-2.5 oz) Filed Weights   07/21/14 2154 07/22/14 2207 07/23/14 0812  Weight: 53.6 kg (118 lb 2.7 oz) 54.03 kg (119 lb 1.8 oz) 56.7 kg (125 lb)   Body mass index is 21.45 kg/(m^2).   Gen Exam: Awake and alert with clear speech.   Neck: Supple, No JVD.   Chest: B/L Clear.  No rales CVS: S1 S2  Regular, +3/6 syst murmur Abdomen: soft, BS +, non tender, non distended.  Extremities: no edema, lower extremities warm to touch. Neurologic: Non Focal.   Skin: No Rash.   Wounds: N/A.   Intake/Output from previous day:  Intake/Output Summary (Last 24 hours) at 07/23/14 1057 Last data filed at 07/22/14 1805  Gross per 24 hour  Intake    480 ml  Output      0 ml  Net    480 ml     LAB RESULTS: CBC  Recent Labs Lab  07/19/14 2322 07/20/14 0437 07/21/14 0351 07/22/14 0540 07/23/14 0629  WBC 9.1 8.8 8.8 7.5 8.5  HGB 7.4* 9.2* 9.0* 8.8* 10.2*  HCT 24.6* 29.8* 29.0* 29.3* 33.7*  PLT 186 172 174 174 173  MCV 97.2 95.2 95.1 97.3 97.4  MCH 29.2 29.4 29.5 29.2 29.5  MCHC 30.1 30.9 31.0 30.0 30.3  RDW 22.2* 21.5* 21.6* 21.0* 20.2*    Chemistries   Recent Labs Lab 07/19/14 1839 07/20/14 0437 07/21/14 0351 07/22/14 0540 07/23/14 0629  NA 142 141 136* 138 133*  K 3.7 5.1 5.0 4.2 5.1  CL 99 97 94* 98 93*  CO2 29 26 25 26 23   GLUCOSE 128* 146* 173* 168* 184*  BUN 13 17 23 13 23   CREATININE 2.34* 2.81* 3.73* 2.57* 3.79*  CALCIUM 9.2 9.3 9.1 9.0 9.1    CBG:  Recent Labs Lab 07/22/14 0749 07/22/14 1121 07/22/14 1639 07/22/14 2113 07/23/14 0654  GLUCAP 183* 230* 128* 147* 184*    GFR Estimated Creatinine Clearance: 11.8 mL/min (by C-G formula based on Cr of 3.79).  Coagulation profile  Recent Labs Lab 07/19/14 1839  INR 1.19    Cardiac Enzymes No results for input(s): CKMB, TROPONINI, MYOGLOBIN in the last 168 hours.  Invalid input(s): CK  Invalid input(s): POCBNP No results for input(s): DDIMER in the last 72 hours. No results for input(s): HGBA1C in the last 72 hours. No results for input(s): CHOL, HDL, LDLCALC, TRIG, CHOLHDL, LDLDIRECT in the last 72 hours. No results for input(s): TSH, T4TOTAL, T3FREE, THYROIDAB in the last 72 hours.  Invalid input(s): FREET3 No results for input(s): VITAMINB12, FOLATE, FERRITIN, TIBC, IRON, RETICCTPCT in the last 72 hours. No results for input(s): LIPASE, AMYLASE in the last 72 hours.  Urine Studies No results for input(s): UHGB, CRYS in the last 72 hours.  Invalid input(s): UACOL, UAPR, USPG, UPH, UTP, UGL, UKET, UBIL, UNIT, UROB, ULEU, UEPI, UWBC, URBC, UBAC, CAST, UCOM, BILUA  MICROBIOLOGY: Recent Results (from the past 240 hour(s))  MRSA PCR Screening     Status: Abnormal   Collection Time: 07/19/14  3:34 PM  Result Value  Ref Range Status   MRSA by PCR POSITIVE (A) NEGATIVE Final    Comment:        The GeneXpert MRSA Assay (FDA approved for NASAL specimens only), is one component of a comprehensive MRSA colonization surveillance program. It is not intended to diagnose MRSA infection nor to guide or monitor treatment for MRSA infections. RESULT CALLED TO, READ BACK BY AND VERIFIED WITH: HOWELL RN  17:30 07/19/14 ([email or web address])   Clostridium Difficile by PCR     Status: None   Collection Time: 07/19/14 10:23 PM  Result Value Ref Range Status   C difficile by pcr NEGATIVE NEGATIVE Final    RADIOLOGY STUDIES/RESULTS: Dg Chest Port 1 View  07/19/2014   CLINICAL DATA:  Shortness of breath, history CHF, end-stage renal disease on dialysis, diabetes mellitus, hypertension, hyperlipidemia, COPD, coronary  artery disease, paroxysmal atrial fibrillation, former smoker  EXAM: PORTABLE CHEST - 1 VIEW  COMPARISON:  Portable exam 1801 hr compared to 07/19/2014 at 1056 hr  FINDINGS: Enlargement of cardiac silhouette post CABG.  Pulmonary vascular congestion.  RIGHT pleural effusion and basilar atelectasis.  Central peribronchial thickening.  No gross pulmonary infiltrate, LEFT pleural effusion, or pneumothorax.  IMPRESSION: Enlargement of cardiac silhouette with pulmonary vascular congestion.  Bronchitic changes with persistent RIGHT pleural effusion and basilar atelectasis.   Electronically Signed   By: Ulyses SouthwardMark  Boles M.D.   On: 07/19/2014 18:23    Jeoffrey MassedGHIMIRE,SHANKER, MD  [facility] Pager:[phone]  If 7PM-7AM, please contact night-coverage [email or web address] Password [email or web address] 07/23/2014, 10:57 AM   LOS: 4 days

## 2014-07-23 NOTE — Progress Notes (Signed)
  Upper endoscopy demonstrated chronic gastritis.  Findings could explain source for GI bleeding.  I carefully restart Plavix.  Signing off

## 2014-07-24 ENCOUNTER — Encounter (HOSPITAL_COMMUNITY): Payer: Self-pay | Admitting: Gastroenterology

## 2014-07-24 DIAGNOSIS — I2510 Atherosclerotic heart disease of native coronary artery without angina pectoris: Secondary | ICD-10-CM

## 2014-07-24 DIAGNOSIS — I959 Hypotension, unspecified: Secondary | ICD-10-CM

## 2014-07-24 LAB — CBC
HCT: 30 % — ABNORMAL LOW (ref 36.0–46.0)
HEMATOCRIT: 34 % — AB (ref 36.0–46.0)
HEMOGLOBIN: 10.3 g/dL — AB (ref 12.0–15.0)
Hemoglobin: 8.7 g/dL — ABNORMAL LOW (ref 12.0–15.0)
MCH: 28.6 pg (ref 26.0–34.0)
MCH: 30.2 pg (ref 26.0–34.0)
MCHC: 29 g/dL — ABNORMAL LOW (ref 30.0–36.0)
MCHC: 30.3 g/dL (ref 30.0–36.0)
MCV: 98.7 fL (ref 78.0–100.0)
MCV: 99.7 fL (ref 78.0–100.0)
PLATELETS: 198 10*3/uL (ref 150–400)
Platelets: 225 10*3/uL (ref 150–400)
RBC: 3.04 MIL/uL — AB (ref 3.87–5.11)
RBC: 3.41 MIL/uL — ABNORMAL LOW (ref 3.87–5.11)
RDW: 19.9 % — AB (ref 11.5–15.5)
RDW: 19.8 % — ABNORMAL HIGH (ref 11.5–15.5)
WBC: 7.3 10*3/uL (ref 4.0–10.5)
WBC: 8.2 10*3/uL (ref 4.0–10.5)

## 2014-07-24 LAB — BASIC METABOLIC PANEL
Anion gap: 20 — ABNORMAL HIGH (ref 5–15)
BUN: 14 mg/dL (ref 6–23)
CALCIUM: 9.1 mg/dL (ref 8.4–10.5)
CO2: 24 mEq/L (ref 19–32)
CREATININE: 2.93 mg/dL — AB (ref 0.50–1.10)
Chloride: 90 mEq/L — ABNORMAL LOW (ref 96–112)
GFR, EST AFRICAN AMERICAN: 17 mL/min — AB (ref >90)
GFR, EST NON AFRICAN AMERICAN: 15 mL/min — AB (ref >90)
GLUCOSE: 181 mg/dL — AB (ref 70–99)
Potassium: 4.1 mEq/L (ref 3.7–5.3)
Sodium: 134 mEq/L — ABNORMAL LOW (ref 137–147)

## 2014-07-24 LAB — TROPONIN I
TROPONIN I: 0.45 ng/mL — AB (ref <0.30)
Troponin I: 0.6 ng/mL (ref <0.30)

## 2014-07-24 LAB — GLUCOSE, CAPILLARY
GLUCOSE-CAPILLARY: 183 mg/dL — AB (ref 70–99)
Glucose-Capillary: 82 mg/dL (ref 70–99)
Glucose-Capillary: 142 mg/dL — ABNORMAL HIGH (ref 70–99)
Glucose-Capillary: 184 mg/dL — ABNORMAL HIGH (ref 70–99)

## 2014-07-24 MED ORDER — MIDODRINE HCL 2.5 MG PO TABS
2.5000 mg | ORAL_TABLET | Freq: Two times a day (BID) | ORAL | Status: DC
  Administered 2014-07-24 – 2014-07-26 (×4): 2.5 mg via ORAL
  Filled 2014-07-24 (×7): qty 1

## 2014-07-24 MED ORDER — CALCITRIOL 0.5 MCG PO CAPS: 0.5000 ug | ORAL_CAPSULE | ORAL | Status: AC

## 2014-07-24 MED ORDER — PANTOPRAZOLE SODIUM 40 MG PO TBEC: 40.0000 mg | DELAYED_RELEASE_TABLET | Freq: Every day | ORAL | Status: DC

## 2014-07-24 NOTE — Consult Note (Signed)
CARDIOLOGY CONSULT NOTE   Patient ID: [name] MRN: [id number], DOB/AGE: 71-19-1942   Admit date: 07/19/2014 Date of Consult: 07/24/2014   Primary Physician: Galvin ProfferHAGUE, IMRAN P, MD Primary Cardiologist: Dr. Tresa EndoKelly  Pt. Profile  73 year old African-American female with past medical history of ESRD on HD M/W/F, severe aortic stenosis, CAD status post CABG in 09/2009 and PCI in 06/2013, recurrent paroxysmal atrial fibrillation, hypertension, diabetes, COPD, and  history of CHF who was discharged and was being transported to her car when she had a syncopal episode  Problem List  Past Medical History  Diagnosis Date  . CHF (congestive heart failure)     diast SHF, RV failure, pulm HTN  . Diabetes mellitus   . ESRD on hemodialysis     HD MWF in [place]   . Hyperlipidemia   . Anemia   . Hypertension   . COPD (chronic obstructive pulmonary disease)     severe with FEV1 0.64 L  . Peripheral vascular disease   . Coronary artery disease     Hx CABG. Cath 07/06/13 for CP episode showed occlusive graft disease and moderate AS; pt was not a candidate for repeat surgery and a complex PCI/rotablator/stenting procedure of calcified native RCA was done by cardiology  . PAF (paroxysmal atrial fibrillation)     recurrent  . Chronic right-sided heart failure   . Aortic stenosis     Past Surgical History  Procedure Laterality Date  . Coronary artery bypass graft  10/20/2009    x3 by Dr Maren BeachVanTrigt with LIMA to the LAD, a vein to the circumflex and a vien to the PDA.  Marland Kitchen. Abdominal hysterectomy    . Thoracentesis Left 12/17/2009  . Av fistula placement  01-12-2008    left upper arm  . Cardiac catheterization  10/15/2009    which showed low normal LV function with mild inferior hypocontractility.  . Thoracentesis Right 10/27/2013  . Esophagogastroduodenoscopy N/A 07/23/2014    Procedure: ESOPHAGOGASTRODUODENOSCOPY (EGD);  Surgeon: Louis Meckelobert D Kaplan, MD;  [facility];  Service: Endoscopy;   Laterality: N/A;     Allergies  Allergies  Allergen Reactions  . Codeine Other (See Comments)    POSSIBLY RASH?  Marland Kitchen. Sulfa Antibiotics Other (See Comments)    unknown    HPI   The patient is a 73 year old African-American female with past medical history of ESRD on HD M/W/F, severe aortic stenosis, CAD status post CABG in 09/2009 and PCI in 06/2013, recurrent paroxysmal atrial fibrillation, hypertension, diabetes, COPD, and  history of CHF. Her last cardiac catheterization on 07/06/2013 showed 60% proximal LAD stenosis, 80% D1 stenosis, 40% proximal RCA stenosis, 90% mid RCA stenosis, patent LIMA to LAD, occluded SVG to circumflex marginal, occluded SVG to PDA and RCA. According to the patient's family member, patient has been able to take care of herself and live independently. Family member does go to her house 3 times a week to take care of her. She has been taking aspirin and Plavix for the last year. Her last echocardiogram obtained in February 2015 showed EF 60-65%, moderate to severe aortic stenosis, valve area 0.45 cm, mild MR, mildly dilated left atrium, PA peak pressure 52. She was previously on amiodarone for her recurrent atrial fibrillation. During her last office visit on 03/18/2014, her amiodarone dose was decreased down to 100 mg daily along with her diltiazem. She was admitted in September for near-syncope and bradycardia with heart rate in the 30s. She also had persistent hypotension during the last  admission. Her diltiazem was discontinued. She had some SVT during the last admission mainly during dialysis and was reloaded with amiodarone. She was discharged on amiodarone alone without rate control medication due to hypotension.  Patient was [facility] on 07/19/2014 was melena after dialysis. GI was consulted. Her aspirin and Plavix was discontinued as her previous PCI was done over a year ago. Patient eventually underwent upper endoscopy on 07/23/2014 which  showed gastritis as possible cause of her GI bleed. She was discharged on 11/24 on aspirin alone without Plavix. Patient was sitting in wheelchair been pushed to her car to leave the hospital when she had a syncopal episode. She does not remember the event. Initial blood pressure was 67/39. O2 saturation 84% and she was placed on nonrebreather mask. Pulse in the 50s. EKG showed sinus bradycardia with heart rate in the 50s and right bundle branch block. Cardiology was consulted for syncope.   Inpatient Medications  . amiodarone  200 mg Oral Daily  . antiseptic oral rinse  7 mL Mouth Rinse BID  . atorvastatin  40 mg Oral QHS  . calcitRIOL  0.5 mcg Oral Q M,W,F-HD  . Chlorhexidine Gluconate Cloth  6 each Topical Q0600  . [START ON 07/31/2014] darbepoetin (ARANESP) injection - DIALYSIS  100 mcg Intravenous Q Wed-HD  . insulin aspart  0-9 Units Subcutaneous TID WC  . loratadine  10 mg Oral Daily  . multivitamin  1 tablet Oral QHS  . mupirocin ointment  1 application Nasal BID  . pantoprazole (PROTONIX) IV  40 mg Intravenous Q12H  . sodium chloride  3 mL Intravenous Q12H  . sodium chloride  3 mL Intravenous Q12H    Family History Family History  Problem Relation Age of Onset  . Heart disease Mother      Social History History   Social History  . Marital Status: Widowed    Spouse Name: N/A    Number of Children: N/A  . Years of Education: N/A   Occupational History  . Retired    Social History Main Topics  . Smoking status: Former Smoker -- 0.50 packs/day    Types: Cigarettes  . Smokeless tobacco: Never Used  . Alcohol Use: No  . Drug Use: No  . Sexual Activity: No   Other Topics Concern  . Not on file   Social History Narrative   The patient currently lives at home with a friend "Trinna Postlex", who helps her around the house.  The patient manages her own medications, using a pill box.     Review of Systems  General:  No chills, fever, night sweats or weight changes.    Cardiovascular:  No chest pain, edema, orthopnea, palpitations, paroxysmal nocturnal dyspnea. +dyspnea on exertion Dermatological: No rash, lesions/masses Respiratory: No cough +mild dyspnea Urologic: No hematuria, dysuria Abdominal:   No nausea, vomiting, diarrhea +melena Neurologic:  No visual changes, wkns. +syncope, however pt does not remember the event All other systems reviewed and are otherwise negative except as noted above.  Physical Exam  Blood pressure 67/39, pulse 52, temperature 99.3 F (37.4 C), temperature source Oral, resp. rate 19, height 5\' 4"  (1.626 m), weight 116 lb 6.5 oz (52.8 kg), SpO2 84 %.  General: Pleasant, NAD Psych: Normal affect. Neuro: Alert and oriented X 3. Moves all extremities spontaneously. HEENT: Normal  Neck: Supple without bruits or JVD. Lungs:  Resp regular and unlabored, rale in L base, markedly decreased breath soundin R base Heart: RRR no s3, s4.  4/6 systolic murmur Abdomen: Soft, non-tender, non-distended, BS + x 4.  Extremities: No clubbing, cyanosis or edema. LUE AVF  Labs  No results for input(s): CKTOTAL, CKMB, TROPONINI in the last 72 hours. Lab Results  Component Value Date   WBC 8.2 07/24/2014   HGB 10.3* 07/24/2014   HCT 34.0* 07/24/2014   MCV 99.7 07/24/2014   PLT 225 07/24/2014    Recent Labs Lab 07/24/14 1605  NA 134*  K 4.1  CL 90*  CO2 24  BUN 14  CREATININE 2.93*  CALCIUM 9.1  GLUCOSE 181*   Lab Results  Component Value Date   CHOL 122 10/06/2013   HDL 89 10/06/2013   LDLCALC 26 10/06/2013   TRIG 36 10/06/2013   No results found for: DDIMER  Radiology/Studies  Dg Chest Port 1 View  07/19/2014   CLINICAL DATA:  Shortness of breath, history CHF, end-stage renal disease on dialysis, diabetes mellitus, hypertension, hyperlipidemia, COPD, coronary artery disease, paroxysmal atrial fibrillation, former smoker  EXAM: PORTABLE CHEST - 1 VIEW  COMPARISON:  Portable exam 1801 hr compared to 07/19/2014 at 1056  hr  FINDINGS: Enlargement of cardiac silhouette post CABG.  Pulmonary vascular congestion.  RIGHT pleural effusion and basilar atelectasis.  Central peribronchial thickening.  No gross pulmonary infiltrate, LEFT pleural effusion, or pneumothorax.  IMPRESSION: Enlargement of cardiac silhouette with pulmonary vascular congestion.  Bronchitic changes with persistent RIGHT pleural effusion and basilar atelectasis.   Electronically Signed   By: Ulyses Southward M.D.   On: 07/19/2014 18:23    ECG  Sinus Chiao with RBBB (EKG interpreted as a-fib, however there is p waves)  ASSESSMENT AND PLAN  1. Syncope  - in the setting of severe inoperable aortic stenosis and hypertension.  - denies any chest pain, unlikely to be ACS  - Last echo 09/2013, EF with 60-65% - discussed with Dr. Excell Seltzer, consider addition of midrodrine for BP support. Continue amiodarone, no BP med.  2. ESRD on HD M/W/F 3. severe aortic stenosis, Not a surgical or TAVR candidate 4. CAD status post CABG in 09/2009 and PCI in 06/2013  - DAPT discontinued due to upper GI bleed 5. Recurrent paroxysmal atrial fibrillation 6. SVT: reloaded with amio in 04/2014 7. Hypertension 8. DM 9. COPD 10. History of CHF  - scattered L basilar rale on exam, s/p dialysis yesterday 11. R pleural effusion: markedly decreased breath sound on R base on exam 12. Chronic RBBB  Signed, Azalee Course, [place] 07/24/2014, 5:20 PM  Patient seen, examined. Available data reviewed. Agree with findings, assessment, and plan as outlined by Azalee Course, PA-C. The patient is known to me from previous hospitalizations. I have reviewed extensive records. Exam reveals an ill-appearing woman, somnolent but oriented to all spheres. Carotids are 2+ with bilateral bruits. Heart is RRR with 3/6 harsh systolic murmur at the base. No peripheral edema. Lung sounds diminished in the bases.   The patient has syncope in the setting of hypotension which is acute on chronic. She has ESRD,  severe AS, and is chronically-ill. I do not think there is anything we can do to alter her extremely poor prognosis at this point. She has been formally evaluated by our multidisciplinary valve team and is not a candidate for treatment of her AS. I discussed hospice/palliative care with the patient and her son. The patient is not interested in this. However, she is a DNR. I advised she would likely have better quality of what time she has left with a palliative  approach to her care. Will add midodrine to support BP. No fluids as she is above her dry weight. Not much else to offer at this point.   Tonny Bollman, M.D. 07/24/2014 6:09 PM

## 2014-07-24 NOTE — Plan of Care (Signed)
Problem: Discharge Progression Outcomes Goal: Complications resolved/controlled Outcome: Not Applicable Date Met:  62/69/48

## 2014-07-24 NOTE — Plan of Care (Signed)
Problem: Discharge Progression Outcomes Goal: Pain controlled with appropriate interventions Outcome: Completed/Met Date Met:  07/24/14     

## 2014-07-24 NOTE — Progress Notes (Signed)
Patient discharged earlier today-while sitting on wheel chair-she had a syncopal episode.Not sure if she lost her pulse, but per RN, pulse was never lost. Patient regained consciousness with a few minutes.BP in right leg-initially low, but then when placed on arm SBP in low 100's but DBP in the 20-30's. Son-Maurice/patient both reconfirm DNR status. She has severe AS, not sure if this hypotension is actually real. Irrespective she carries a poor overall prognoses.Per Dr Dunham-nephrology having a hard time removing fluid off her-and she is actually 2 lbs above her dry weight. I will consult Cards to prognosticate/see if any further work up is required. Keep in SDU overnight.

## 2014-07-24 NOTE — Progress Notes (Signed)
[facility] Rounding Note Subjective:   Not exactly sure why she was transferred to the ICU yesterday There is a mention in the EGD procedure note of hypoxemia She doesn't recall anything Study showed gastritis  Objective: Vital signs in last 24 hours: Temp:  [97.9 F (36.6 C)-100.2 F (37.9 C)] 98.4 F (36.9 C) (11/25 0400) Pulse Rate:  [51-66] 56 (11/25 0400) Resp:  [15-23] 17 (11/25 0400) BP: (80-139)/(21-68) 91/26 mmHg (11/25 0400) SpO2:  [73 %-100 %] 96 % (11/25 0400) FiO2 (%):  [4 %-100 %] 4 % (11/25 0700) Weight:  [52.8 kg (116 lb 6.5 oz)] 52.8 kg (116 lb 6.5 oz) (11/24 1250) Weight change: 2.67 kg (5 lb 14.2 oz)  Intake/Output from previous day: 11/24 0701 - 11/25 0700 In: 863 [P.O.:360; I.V.:503] Out: 2400  Intake/Output this shift:    EXAM: General appearance: Awake, alert, just got OOB with PT and wants to wash her face before she eats breakfast Resp:Anteriorly clear Coarse crackles velcro like bases Cardio: RRR with Gr II/VI systolic murmur heard across precordium and into her back, no rub GI: + BS, soft and nontender Extremities: No edema Access: AVF @ LUA with + bruit   Lab Results:  Recent Labs  07/23/14 0629 07/24/14 0331  WBC 8.5 7.3  HGB 10.2* 8.7*  HCT 33.7* 30.0*  PLT 173 198   BMET:   Recent Labs  07/22/14 0540 07/23/14 0629  NA 138 133*  K 4.2 5.1  CL 98 93*  CO2 26 23  GLUCOSE 168* 184*  BUN 13 23  CREATININE 2.57* 3.79*  CALCIUM 9.0 9.1  ALBUMIN 2.9* 3.0*   Scheduled Medications . amiodarone  200 mg Oral Daily  . antiseptic oral rinse  7 mL Mouth Rinse BID  . atorvastatin  40 mg Oral QHS  . calcitRIOL  0.5 mcg Oral Q M,W,F-HD  . Chlorhexidine Gluconate Cloth  6 each Topical Q0600  . [START ON 07/31/2014] darbepoetin (ARANESP) injection - DIALYSIS  100 mcg Intravenous Q Wed-HD  . insulin aspart  0-9 Units Subcutaneous TID WC  . loratadine  10 mg Oral Daily  . multivitamin  1 tablet Oral QHS  . mupirocin  ointment  1 application Nasal BID  . pantoprazole (PROTONIX) IV  40 mg Intravenous Q12H  . sodium chloride  3 mL Intravenous Q12H  . sodium chloride  3 mL Intravenous Q12H    HD: MWF [place] 4hr 30 mins 51.5kgs 2K/2.25ca No Heparin L AVF 160 350/1.5  Calcitriol 0.5 mcg Aranesp 100  Profile 4  Assessment/Plan: 1. Melena - Hx duodenal ulcer, ASA/Plavix dc'd, continues PPI; No stools. EGD 11/24 showed gastritis. Hb 8.7 (wonder if 10.2 yesterday in error since had been stable 8.7-8.8 since transfusion on 11/21) 2. ESRD - HD on MWF @ [facility],No Heparin. HD yesterday 11/24 per holiday schedule. Next HD will be on Friday. 3. HTN/Volume - EDW 51.5 kg 52.8 post HD 11/24 Low BP's limit UF with her tight aortic stenosis 4. Anemia - Hgb of 10.2 yesterday may have been error (see #1). s/p 1 U PRBCs 11/21; Aranesp 100 mcg on Wed (rec'd 11/24 per holiday schedule). 5. 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  6. Nutrition - Alb 2.9, carb-mod renal diet, vitamin. 7. Hx A-fib - on Amiodarone.  8. DM - per primary. 9. Severe inoperable AS 10. CAD 11. Severe COPD O2 dependent - apparently had some hypoxia during EGD yesterday. Sats ranging 90-96% on nasal cannula  and comfortable at this time  Cassie Balynthia Domani Bakos, MD [facility] [phone] Pager 07/24/2014, 8:43 AM

## 2014-07-24 NOTE — Progress Notes (Signed)
I was responding to a CODE BLUE patient. This 73 year old with diabetes, end-stage renal disease on dialysis, severe aortic stenosis, known atrial fibrillation-not on Coumadin due to upper GI bleed, for which she was admitted and transfused-EGD showing chronic gastritis. She was found to be hypotensive post EGD, hence observed overnight and stepdown unit. Once her blood pressure improved she was discharged today. She was revealed by the nursing assistant, accompanied by her grandson, was less responsive in the elevator hence we'll back to the unit. Found to be unresponsive and hence CODE BLUE called. On my arrival, she was transferred from wheelchair to bed, femoral pulse was noted, she was bagged, mental status slowly recovered, CBG was 184, there is no history of narcotics administered, hence Narcan was not given. She was noted to be hypotensive to the 70s, fluid bolus was started with some recovery of blood pressure and mental status. 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 DO NOT RESUSCITATE was confirmed from the chart, and again after speaking to her grandson. Dr. Jerral RalphGhimire arrived to assume care at this point. Critical care time x 30 minutes  Oretha MilchALVA,Bernarr Longsworth V. MD

## 2014-07-24 NOTE — Plan of Care (Signed)
Problem: Discharge Progression Outcomes Goal: Tolerating diet Outcome: Completed/Met Date Met:  07/24/14

## 2014-07-24 NOTE — Plan of Care (Signed)
Problem: Phase III Progression Outcomes Goal: Pain controlled on oral analgesia Outcome: Completed/Met Date Met:  07/24/14 Goal: IV/normal saline lock discontinued Outcome: Completed/Met Date Met:  07/24/14

## 2014-07-24 NOTE — Progress Notes (Signed)
Physical Therapy Treatment Patient Details Name: [name] MRN: [id number] DOB: 1941/07/24 Today's Date: 07/24/2014    History of Present Illness [name] is a 73 y.o. female with a Past Medical History of end-stage renal disease on dialysis M/W/F, severe aortic stenosis, coronary artery disease status post last PCI in 06/2013. Pt was at hemodialysis today when she was noted to have a large melanotic stool. She became briefly hypotensive as well, is given IV fluid boluses, and subsequently [facility]. Per chart, pt poor historian.Pt underwent EGD and found to have gastritis.    PT Comments    Pt remains weak. Currently will need 24 hour assist at home. Pt with breakfast present and ready to eat. Will return later for further ambulation.  Follow Up Recommendations  Home health PT;Supervision/Assistance - 24 hour     Equipment Recommendations  None recommended by PT    Recommendations for Other Services       Precautions / Restrictions Precautions Precautions: Fall Restrictions Weight Bearing Restrictions: No    Mobility  Bed Mobility Overal bed mobility: Needs Assistance Bed Mobility: Supine to Sit     Supine to sit: Min assist     General bed mobility comments: Assist to elevate trunk.  Transfers Overall transfer level: Needs assistance Equipment used: Rolling walker (2 wheeled) Transfers: Sit to/from [employer]Stand;Stand Pivot Transfers Sit to Stand: Min assist Stand pivot transfers: Min assist       General transfer comment: Assist to bring hips up. Pivotal steps to recliner wtih walker.  Ambulation/Gait             General Gait Details: Pivotal steps to recliner due to pt's breakfast there and pt ready to eat.   Stairs            Wheelchair Mobility    Modified Rankin (Stroke Patients Only)       Balance Overall balance assessment: Needs assistance Sitting-balance support: No upper extremity supported;Feet  supported Sitting balance-Leahy Scale: Fair     Standing balance support: Bilateral upper extremity supported Standing balance-Leahy Scale: Poor Standing balance comment: walker and min guard assist for static standing.                    Cognition Arousal/Alertness: Awake/alert Behavior During Therapy: WFL for tasks assessed/performed Overall Cognitive Status: Within Functional Limits for tasks assessed                      Exercises      General Comments        Pertinent Vitals/Pain Pain Assessment: No/denies pain    Home Living                      Prior Function            PT Goals (current goals can now be found in the care plan section) Progress towards PT goals: Progressing toward goals    Frequency  Min 3X/week    PT Plan Current plan remains appropriate    Co-evaluation             End of Session Equipment Utilized During Treatment: Gait belt;Oxygen Activity Tolerance: Patient limited by fatigue Patient left: in chair;with call bell/phone within reach;with chair alarm set     Time: [id number] PT Time Calculation (min) (ACUTE ONLY): 26 min  Charges:  $Gait Training: 23-37 mins  G Codes:      Jamariya Davidoff 07/24/2014, 8:49 AM  [facility] PT [phone]

## 2014-07-24 NOTE — Plan of Care (Signed)
Problem: Discharge Progression Outcomes Goal: Hemodynamically stable Outcome: Completed/Met Date Met:  07/24/14     

## 2014-07-24 NOTE — Clinical Documentation Improvement (Signed)
Presents with melena; had EGD with Biopsy.    Hypoxia noted in Op note  Transferred to ICU post procedure  Sats dropped to 73% to low 80's  Bradycardic to 50's  FiO2 has increased from 2L to 4L  Please provide a diagnosis associated with the above clinical data and document findings in next progress note and include in discharge summary.  Acute Respiratory Failure Acute on Chronic Respiratory Failure Chronic Respiratory Failure Acute Respiratory Insufficiency following surgery or trauma Other Condition  Thank You, Shellee MiloEileen T Jonice Cerra ,RN Clinical Documentation Specialist:  [phone]  [facility]- Health Information Management

## 2014-07-24 NOTE — Progress Notes (Signed)
Discharged home  By wheelchair, accompanied by son, claim form given to son for her money and checkbook  To be taken from the safe, prescription and discharge instructions given to pt/son.

## 2014-07-24 NOTE — Progress Notes (Signed)
Placed a call on pt's mother and brother letting them know that pt. Is being discharged and needed somebody to come and pick her up.

## 2014-07-24 NOTE — Progress Notes (Signed)
MD made aware about the troponin- 0.60, claimed that cardiologist is coming to see her.

## 2014-07-24 NOTE — Plan of Care (Signed)
Problem: Discharge Progression Outcomes Goal: Discharge plan in place and appropriate Outcome: Completed/Met Date Met:  07/24/14     

## 2014-07-24 NOTE — Plan of Care (Signed)
Problem: Discharge Progression Outcomes Goal: Activity appropriate for discharge plan Outcome: Completed/Met Date Met:  07/24/14

## 2014-07-24 NOTE — Progress Notes (Addendum)
Pt. Was discharged home and got a call from NA that while assisting pt in the car she became unresponsive. Wheeled back to the unit placed in 2Hroom26, unresponsive but with pulse ambu bagged and became responsive. Cbg-184. Bp 70's bolused with ns, Dr. Vassie LollAlva at the bedside DR .Ghimire notified and seen pt at once.  Lates bp- 121/27. Hr-56  A-fib, 97% on o2 [place] 2l. Continue to monitor.

## 2014-07-24 NOTE — Progress Notes (Signed)
Chaplain responded to code blue page. Chaplain met pt grandson, [name], waiting outside room. Pt was discharged and grandson was putting her in the truck when she coded. Chaplain offered prayer with grandson and provided emotional support. After code chaplain introduced herself to pt. Page chaplain as needed.    07/24/14 1600  Clinical Encounter Type  Visited With Patient and family together;Health care provider  Visit Type Code  Spiritual Encounters  Spiritual Needs Prayer;Emotional  Stress Factors  Family Stress Factors Health changes  Phyillis Dascoli, Epifanio Lesches 07/24/2014 4:19 PM

## 2014-07-24 NOTE — Progress Notes (Signed)
Physical Therapy Treatment Patient Details Name: [name] MRN: [id number] DOB: Jul 15, 1941 Today's Date: 07/24/2014    History of Present Illness Cassie Paiseggy A Fuhrer is a 73 y.o. female with a Past Medical History of end-stage renal disease on dialysis M/W/F, severe aortic stenosis, coronary artery disease status post last PCI in 06/2013. Pt was at hemodialysis today when she was noted to have a large melanotic stool. She became briefly hypotensive as well, is given IV fluid boluses, and [facility]. Per chart, pt poor historian.Pt underwent EGD and found to have gastritis.    PT Comments    Pt more lethargic than earlier. Pt only able to amb 5'. Pt denied need to go to SNF but did agree she would get someone to stay with her 24 hours to assist.  Follow Up Recommendations  Home health PT;Supervision/Assistance - 24 hour     Equipment Recommendations  None recommended by PT    Recommendations for Other Services       Precautions / Restrictions Precautions Precautions: Fall    Mobility  Bed Mobility            Transfers Overall transfer level: Needs assistance Equipment used: Rolling walker (2 wheeled) Transfers: Sit to/from [employer]Stand;Stand Pivot Transfers Sit to Stand: Min assist Stand pivot transfers: Min assist       General transfer comment: Assist to bring hips up.   Ambulation/Gait Ambulation/Gait assistance: Min assist Ambulation Distance (Feet): 5 Feet Assistive device: Rolling walker (2 wheeled) Gait Pattern/deviations: Step-through pattern;Decreased step length - right;Decreased step length - left;Trunk flexed;Shuffle Gait velocity: decreased Gait velocity interpretation: Below normal speed for age/gender General Gait Details: Assist for balance and verbal cues to stand more erect.   Stairs            Wheelchair Mobility    Modified Rankin (Stroke Patients Only)       Balance Overall balance assessment:  Needs assistance Sitting-balance support: No upper extremity supported;Feet supported Sitting balance-Leahy Scale: Fair     Standing balance support: Bilateral upper extremity supported Standing balance-Leahy Scale: Poor Standing balance comment: walker and min guard assist for static standing.                    Cognition Arousal/Alertness: Lethargic Behavior During Therapy: Flat affect Overall Cognitive Status: Within Functional Limits for tasks assessed                      Exercises      General Comments        Pertinent Vitals/Pain Pain Assessment: No/denies pain    Home Living                      Prior Function            PT Goals (current goals can now be found in the care plan section) Progress towards PT goals: Not progressing toward goals - comment (lethargy and fatigue)    Frequency  Min 3X/week    PT Plan Current plan remains appropriate    Co-evaluation             End of Session Equipment Utilized During Treatment: Gait belt;Oxygen Activity Tolerance: Patient limited by fatigue Patient left: in chair;with call bell/phone within reach;with chair alarm set     Time: [id number] PT Time Calculation (min) (ACUTE ONLY): 24 min  Charges:  $Gait Training: 23-37 mins  G Codes:      Coila Wardell 07/24/2014, 12:22 PM  [facility] Bernell Haynie PT [phone]

## 2014-07-24 NOTE — Discharge Summary (Addendum)
PATIENT DETAILS Name: [name] Age: 73 y.o. Sex: female Date of Birth: [birth year] MRN: [id number]. Admitting Physician: Joseph Art, DO WUJ:WJXBJ, Myrene Galas, MD  Admit Date: 07/19/2014 Discharge date: 07/26/2014  Recommendations for Outpatient Follow-up:  1. Please repeat CBC in 1 week. 2. Given gastrointestinal bleeding-now only on aspirin. Patient is one year out from her recent PCI with drug-eluting stent. 3. Also has atrial fibrillation, given gastrointestinal bleeding this admission, not a Coumadin candidate for the immediate future. Please reevaluate at next visit. 4. Please consider palliative care evaluation while at SNF.  PRIMARY DISCHARGE DIAGNOSIS:  Principal Problem:   Melena Active Problems:   DM (diabetes mellitus), type 2 with renal complications   Aortic stenosis, severe   CAD- CABG X 3 09/2009, HSRA/DES to native RCA 07/11/13   ESRD (end stage renal disease)   Paroxysmal atrial fibrillation   Acute blood loss anemia   Chronic gastritis with bleeding   Arterial hypotension      PAST MEDICAL HISTORY: Past Medical History  Diagnosis Date  . CHF (congestive heart failure)     diast SHF, RV failure, pulm HTN  . Diabetes mellitus   . ESRD on hemodialysis     HD MWF in [place]   . Hyperlipidemia   . Anemia   . Hypertension   . COPD (chronic obstructive pulmonary disease)     severe with FEV1 0.64 L  . Peripheral vascular disease   . Coronary artery disease     Hx CABG. Cath 07/06/13 for CP episode showed occlusive graft disease and moderate AS; pt was not a candidate for repeat surgery and a complex PCI/rotablator/stenting procedure of calcified native RCA was done by cardiology  . PAF (paroxysmal atrial fibrillation)     recurrent  . Chronic right-sided heart failure   . Aortic stenosis     DISCHARGE MEDICATIONS: Current Discharge Medication List    START taking these medications   Details  calcitRIOL (ROCALTROL) 0.5 MCG capsule Take 1  capsule (0.5 mcg total) by mouth every Monday, Wednesday, and Friday with hemodialysis. Qty: 60 capsule, Refills: 0    !! midodrine (PROAMATINE) 2.5 MG tablet Take 1 tablet (2.5 mg total) by mouth 2 (two) times daily with a meal. on Sunday, Tuesday, Thursday, Saturday    !! midodrine (PROAMATINE) 5 MG tablet Take 1 tablet (5 mg total) by mouth every Monday, Wednesday, and Friday with hemodialysis.    Nutritional Supplements (FEEDING SUPPLEMENT, NEPRO CARB STEADY,) LIQD Take 237 mLs by mouth as needed (missed meal during dialysis.). Refills: 0    pantoprazole (PROTONIX) 40 MG tablet Take 1 tablet (40 mg total) by mouth daily. Qty: 30 tablet, Refills: 0     !! - Potential duplicate medications found. Please discuss with provider.    CONTINUE these medications which have CHANGED   Details  amiodarone (PACERONE) 400 MG tablet Take 0.5 tablets (200 mg total) by mouth daily. Qty: 30 tablet, Refills: 1      CONTINUE these medications which have NOT CHANGED   Details  aspirin 81 MG chewable tablet Chew 81 mg by mouth daily.     atorvastatin (LIPITOR) 40 MG tablet Take 40 mg by mouth at bedtime.    b complex-vitamin c-folic acid (NEPHRO-VITE) 0.8 MG TABS tablet Take 1 tablet by mouth at bedtime.    fexofenadine (ALLEGRA) 180 MG tablet Take 180 mg by mouth daily.    insulin aspart (NOVOLOG) 100 UNIT/ML injection Inject 2-10 Units into the skin See admin  instructions. Sliding scale. 151-200 use 2 units; 201-250 use 4 units; 251-300 use 6 units; 301-350 use 8 units; 351-400 use 10 units    nitroGLYCERIN (NITROSTAT) 0.4 MG SL tablet Place 0.4 mg under the tongue every 5 (five) minutes as needed for chest pain.      STOP taking these medications     clopidogrel (PLAVIX) 75 MG tablet         ALLERGIES:   Allergies  Allergen Reactions  . Codeine Other (See Comments)    POSSIBLY RASH?  Marland Kitchen. Sulfa Antibiotics Other (See Comments)    unknown    BRIEF HPI:  See H&P, Labs, Consult and  Test reports for all details in brief, patient was admitted for evaluation of melanotic stools.  CONSULTATIONS:   GI and nephrology  PERTINENT RADIOLOGIC STUDIES: Dg Chest Port 1 View  07/19/2014   CLINICAL DATA:  Shortness of breath, history CHF, end-stage renal disease on dialysis, diabetes mellitus, hypertension, hyperlipidemia, COPD, coronary artery disease, paroxysmal atrial fibrillation, former smoker  EXAM: PORTABLE CHEST - 1 VIEW  COMPARISON:  Portable exam 1801 hr compared to 07/19/2014 at 1056 hr  FINDINGS: Enlargement of cardiac silhouette post CABG.  Pulmonary vascular congestion.  RIGHT pleural effusion and basilar atelectasis.  Central peribronchial thickening.  No gross pulmonary infiltrate, LEFT pleural effusion, or pneumothorax.  IMPRESSION: Enlargement of cardiac silhouette with pulmonary vascular congestion.  Bronchitic changes with persistent RIGHT pleural effusion and basilar atelectasis.   Electronically Signed   By: Ulyses SouthwardMark  Boles M.D.   On: 07/19/2014 18:23     PERTINENT LAB RESULTS: CBC:  Recent Labs  07/24/14 1605 07/25/14 0506  WBC 8.2 7.0  HGB 10.3* 9.1*  HCT 34.0* 31.2*  PLT 225 215   CMET CMP     Component Value Date/Time   NA 135* 07/25/2014 0506   K 4.2 07/25/2014 0506   CL 95* 07/25/2014 0506   CO2 24 07/25/2014 0506   GLUCOSE 127* 07/25/2014 0506   BUN 18 07/25/2014 0506   CREATININE 3.76* 07/25/2014 0506   CREATININE 2.65* 11/22/2013 1055   CALCIUM 8.8 07/25/2014 0506   PROT 7.4 05/17/2014 0850   ALBUMIN 2.9* 07/25/2014 0506   AST 30 05/17/2014 0850   ALT 21 05/17/2014 0850   ALKPHOS 219* 05/17/2014 0850   BILITOT 0.3 05/17/2014 0850   GFRNONAA 11* 07/25/2014 0506   GFRAA 13* 07/25/2014 0506    GFR Estimated Creatinine Clearance: 11.9 mL/min (by C-G formula based on Cr of 3.76). No results for input(s): LIPASE, AMYLASE in the last 72 hours.  Recent Labs  07/24/14 1605 07/24/14 2204 07/25/14 0506  TROPONINI 0.60* 0.45* <0.30    Invalid input(s): POCBNP No results for input(s): DDIMER in the last 72 hours. No results for input(s): HGBA1C in the last 72 hours. No results for input(s): CHOL, HDL, LDLCALC, TRIG, CHOLHDL, LDLDIRECT in the last 72 hours. No results for input(s): TSH, T4TOTAL, T3FREE, THYROIDAB in the last 72 hours.  Invalid input(s): FREET3  Recent Labs  07/25/14 0506  TIBC 186*  IRON 30*   Coags: No results for input(s): INR in the last 72 hours.  Invalid input(s): PT Microbiology: Recent Results (from the past 240 hour(s))  MRSA PCR Screening     Status: Abnormal   Collection Time: 07/19/14  3:34 PM  Result Value Ref Range Status   MRSA by PCR POSITIVE (A) NEGATIVE Final    Comment:        The GeneXpert MRSA Assay (FDA approved for NASAL  specimens only), is one component of a comprehensive MRSA colonization surveillance program. It is not intended to diagnose MRSA infection nor to guide or monitor treatment for MRSA infections. RESULT CALLED TO, READ BACK BY AND VERIFIED WITH: HOWELL RN  17:30 07/19/14 ([email or web address])   Clostridium Difficile by PCR     Status: None   Collection Time: 07/19/14 10:23 PM  Result Value Ref Range Status   C difficile by pcr NEGATIVE NEGATIVE Final     BRIEF HOSPITAL COURSE:  Brief Summary Patient is a 73 year old unfortunate female with a history of end-stage renal disease on hemodialysis, severe aortic stenosis not a candidate for any form of intervention who was admitted on 11/20 upper GI bleed and acute blood loss anemia. Since she was on Plavix, EGD was delayed for Plavix washout, underwent EGD on 11/24 which showed chronic gastritis. Post EGD, she transiently became hypotensive and hypoxic, that was managed with just supportive care. She was eventually discharged on 11/25, however she sustained a syncopal episode while in the hospital elevator. Cardiology was consulted to see if any further recommendations, per cardiology, nothing further to offer.  Unfortunately, patient has now become significantly deconditioned and is weak. She will now require SNF placement. She remains a DO NOT RESUSCITATE, however the patient is not willing to except hospice care yet. Suspect will benefit from palliative care/hospice evaluation while at SNF.  Hospital course by problem list: Syncope: occurred immediately after being discharged on 07/24/14. Multifactorial etiology-Severe AS, ESRD, probable autonomic dysfunction. Seen by cards on 11/25-no further interventions or recommendations-apart from Midodrine. Plan on 2.5 mg BID on non HD days, and 5 mg BID on HD days. Unfortunately significantly deconditioned and weak, suspect will require SNF placement, patient agreeable. Social worker consulted. In regards to her syncope/AS unfortunately nothing much to offer apart from hospice care, for which she is not yet ready. Understands poor overall prognoses.Remains a DNR.   Elevated Troponin:secondary to demand ischemia/hypotension. Not a candidate for further intervention.  UGI Bleed:Resolved. Continue PPI, Transfused 1 unit of PRBC on day of admit. Has prior hx of Duodenal Ulcer.Stopped ASA/Plavix for now. GI consulted, EGD on 11/24 showed chronic gastritis. Hb remains stable.  Active Problems:  Acute Blood loss anemia: has acute blood loss anemia on top of anemia of chronic kidney disease. Hb dropped to 7.4 on admission, given one unit of PRBC. Hb remained stable post transfusion. Continue to follow CBC and transfuse prn.  Diarrhea:C Diff PCR negative. Supportive care-Imodium prn.   Paroxysmal atrial fibrillation: Continue amiodarone, not on Coumadin because of recent PCI/now GI bleed. Will only maintain on ASA.   ESRD (end stage renal disease): On hemodialysis M/W/F. Renal consulted.  DM (diabetes mellitus), type 2 with renal complications:CBG's stable, c/w SSI  Severe AS:per prior cardiology note -not a candidate for any intervention  CAD- CABG X 3  09/2009, HSRA/DES to native RCA 07/11/13: Held aspirin/Plavix on admission-given patient is one year out from PCI-and with GI bleed-will only place on ASA.Marland Kitchen     Reported history of COPD: Continue home O2.    Deconditioning/Gen weakness:on going failure to thrive syndrome, with multiple chronic comorbidities-and now with acute illness and prolonged hospitalization.Agreeable for SNF.  TODAY-DAY OF DISCHARGE:  Subjective:   De [place] today has no headache,no chest abdominal pain,no new weakness tingling or numbness.  Objective:   Blood pressure 116/39, pulse 63, temperature 98 F (36.7 C), temperature source Oral, resp. rate 18, height 5\' 4"  (1.626 m), weight 55.2 kg (121 lb 11.1  oz), SpO2 97 %.  Intake/Output Summary (Last 24 hours) at 07/26/14 0922 Last data filed at 07/25/14 1900  Gross per 24 hour  Intake    440 ml  Output      0 ml  Net    440 ml   Filed Weights   07/23/14 0812 07/23/14 1250 07/26/14 0808  Weight: 56.7 kg (125 lb) 52.8 kg (116 lb 6.5 oz) 55.2 kg (121 lb 11.1 oz)    Exam Awake Alert, Oriented *3, No new F.N deficits, Normal affect [place].AT,PERRAL Supple Neck,No JVD, No cervical lymphadenopathy appriciated.  Symmetrical Chest wall movement, Good air movement bilaterally, CTAB RRR,No Gallops,Rubs or new Murmurs, No Parasternal Heave +ve B.Sounds, Abd Soft, Non tender, No organomegaly appriciated, No rebound -guarding or rigidity. No Cyanosis, Clubbing or edema, No new Rash or bruise  DISCHARGE CONDITION: Stable  DISPOSITION: Home with home health services  DISCHARGE INSTRUCTIONS:    Activity:  As tolerated with Full fall precautions use walker/cane & assistance as needed  Diet recommendation: Diabetic Diet Heart Healthy diet Fluid restriction-1200/day  CODE STATUS: DNR  Discharge Instructions    Call MD for:    Complete by:  As directed   Black stools     Diet - low sodium heart healthy    Complete by:  As directed      Diet Carb Modified     Complete by:  As directed      Increase activity slowly    Complete by:  As directed            Follow-up Information    Follow up with HAGUE, Myrene GalasIMRAN P, MD. Schedule an appointment as soon as possible for a visit in 1 week.   Specialty:  Internal Medicine   Contact information:   [address] [place] [address] [phone]       Follow up with Lennette BihariKELLY,THOMAS A, MD On 08/08/2014.   Specialty:  Cardiology   Why:  appointment at 11:45 am for a follow up visit   Contact information:   [address] [place] [address] [phone]       Please follow up.   Contact information:   Hemodialysis center-at your usual schedule      Total Time spent on discharge equals 45 minutes.  SignedJeoffrey Massed: GHIMIRE,SHANKER 07/26/2014 9:22 AM

## 2014-07-25 DIAGNOSIS — R55 Syncope and collapse: Secondary | ICD-10-CM

## 2014-07-25 DIAGNOSIS — R7989 Other specified abnormal findings of blood chemistry: Secondary | ICD-10-CM

## 2014-07-25 LAB — RENAL FUNCTION PANEL
Albumin: 2.9 g/dL — ABNORMAL LOW (ref 3.5–5.2)
Anion gap: 16 — ABNORMAL HIGH (ref 5–15)
BUN: 18 mg/dL (ref 6–23)
CALCIUM: 8.8 mg/dL (ref 8.4–10.5)
CO2: 24 mEq/L (ref 19–32)
CREATININE: 3.76 mg/dL — AB (ref 0.50–1.10)
Chloride: 95 mEq/L — ABNORMAL LOW (ref 96–112)
GFR calc Af Amer: 13 mL/min — ABNORMAL LOW (ref >90)
GFR calc non Af Amer: 11 mL/min — ABNORMAL LOW (ref >90)
GLUCOSE: 127 mg/dL — AB (ref 70–99)
PHOSPHORUS: 4.5 mg/dL (ref 2.3–4.6)
Potassium: 4.2 mEq/L (ref 3.7–5.3)
Sodium: 135 mEq/L — ABNORMAL LOW (ref 137–147)

## 2014-07-25 LAB — GLUCOSE, CAPILLARY
GLUCOSE-CAPILLARY: 219 mg/dL — AB (ref 70–99)
GLUCOSE-CAPILLARY: 133 mg/dL — AB (ref 70–99)
Glucose-Capillary: 119 mg/dL — ABNORMAL HIGH (ref 70–99)
Glucose-Capillary: 152 mg/dL — ABNORMAL HIGH (ref 70–99)

## 2014-07-25 LAB — CBC
HCT: 31.2 % — ABNORMAL LOW (ref 36.0–46.0)
HEMOGLOBIN: 9.1 g/dL — AB (ref 12.0–15.0)
MCH: 28.5 pg (ref 26.0–34.0)
MCHC: 29.2 g/dL — AB (ref 30.0–36.0)
MCV: 97.8 fL (ref 78.0–100.0)
PLATELETS: 215 10*3/uL (ref 150–400)
RBC: 3.19 MIL/uL — ABNORMAL LOW (ref 3.87–5.11)
RDW: 19.2 % — ABNORMAL HIGH (ref 11.5–15.5)
WBC: 7 10*3/uL (ref 4.0–10.5)

## 2014-07-25 LAB — IRON AND TIBC
IRON: 30 ug/dL — AB (ref 42–135)
Saturation Ratios: 16 % — ABNORMAL LOW (ref 20–55)
TIBC: 186 ug/dL — ABNORMAL LOW (ref 250–470)
UIBC: 156 ug/dL (ref 125–400)

## 2014-07-25 LAB — TROPONIN I: Troponin I: <0.3 ng/mL (ref <0.30)

## 2014-07-25 MED ORDER — LIDOCAINE-PRILOCAINE 2.5-2.5 % EX CREA: 1.0000 "application " | TOPICAL_CREAM | CUTANEOUS | Status: DC | PRN

## 2014-07-25 MED ORDER — MIDODRINE HCL 5 MG PO TABS
5.0000 mg | ORAL_TABLET | ORAL | Status: DC
Start: 2014-07-26 — End: 2014-07-26
  Administered 2014-07-26: 5 mg via ORAL
  Filled 2014-07-25: qty 1

## 2014-07-25 MED ORDER — PENTAFLUOROPROP-TETRAFLUOROETH EX AERO: 1.0000 "application " | INHALATION_SPRAY | CUTANEOUS | Status: DC | PRN

## 2014-07-25 MED ORDER — LIDOCAINE HCL (PF) 1 % IJ SOLN: 5.0000 mL | INTRAMUSCULAR | Status: DC | PRN

## 2014-07-25 MED ORDER — HEPARIN SODIUM (PORCINE) 1000 UNIT/ML DIALYSIS: 1000.0000 [IU] | INTRAMUSCULAR | Status: DC | PRN

## 2014-07-25 MED ORDER — NEPRO/CARBSTEADY PO LIQD: 237.0000 mL | ORAL | Status: DC | PRN

## 2014-07-25 MED ORDER — SODIUM CHLORIDE 0.9 % IV SOLN: 100.0000 mL | INTRAVENOUS | Status: DC | PRN

## 2014-07-25 MED ORDER — ALTEPLASE 2 MG IJ SOLR
2.0000 mg | Freq: Once | INTRAMUSCULAR | Status: AC | PRN
  Filled 2014-07-25: qty 2

## 2014-07-25 NOTE — Progress Notes (Signed)
PATIENT DETAILS Name: [name] Age: 73 y.o. Sex: female Date of Birth: Dec 26, 1940 Admit Date: 07/19/2014 Admitting Physician Joseph ArtJessica U Vann, DO GNF:AOZHYPCP:HAGUE, Myrene GalasIMRAN P, MD  Subjective: No complaints this am.  Assessment/Plan: Principal Problem:   Syncope: occurred immediately after being discharged. Multifactorial-Severe AS, ESRD, probable autonomic dysfunction. Seen by cards on 11/25-no further interventions or recommendations-apart from Midodrine. Plan on 2.5 mg BID on non HD days, and 5 mg BID on HD days. Will ask RN/PT to ambulate, and see how she does. If able to ambulate , and BP remains stable, suspect we could likely discharge her today. Unfortunately nothing much to offer apart from hospice care, for which she is not yet ready. Understands poor overall prognoses.Remains a DNR.   Elevated Troponin:secondary to demand ischemia. Not a candidate for further intervention.  UGI Bleed:Continue PPI, Transfuse 1 unit of PRBC on day of admit. Has prior hx of Duodenal Ulcer.Stopped ASA/Plavix for now. GI consulted,  EGD on 11/24 showed chronic gastritis. Hb remains stable.  Active Problems:   Acute Blood loss anemia: has acute blood loss anemia on top of anemia of chronic kidney disease. Hb dropped to 7.4 on admission, given one unit of PRBC. Hb remained stable posttransfusion. Continue to follow CBC and transfuse prn.   Diarrhea:C Diff PCR negative. Supportive care-Imodium prn.    Paroxysmal atrial fibrillation: Continue amiodarone, not on Coumadin because of recent PCI/now GI bleed. Will only maintain on ASA.    ESRD (end stage renal disease): On hemodialysis M/W/F. Renal consulted.   DM (diabetes mellitus), type 2 with renal complications:CBG's stable, c/w SSI   Severe AS:per prior cardiology note -not a candidate for any intervention   CAD- CABG X 3 09/2009, HSRA/DES to native RCA 07/11/13: Held aspirin/Plavix on admission-given patient is one year out from PCI-and with GI  bleed-will only place on ASA.  Disposition: Remain inpatient-suspect home today or tomorrow depending how she does with ambulation  Antibiotics:  Anti-infectives    None     DVT Prophylaxis: SCD's  Code Status:  DNR   Family Communication Judith PartMaurice Linthicum-son-at bedside on 11/25  Procedures:  None  CONSULTS:  GI, Cardiology and nephrology   MEDICATIONS: Scheduled Meds: . amiodarone  200 mg Oral Daily  . antiseptic oral rinse  7 mL Mouth Rinse BID  . atorvastatin  40 mg Oral QHS  . calcitRIOL  0.5 mcg Oral Q M,W,F-HD  . [START ON 07/31/2014] darbepoetin (ARANESP) injection - DIALYSIS  100 mcg Intravenous Q Wed-HD  . insulin aspart  0-9 Units Subcutaneous TID WC  . loratadine  10 mg Oral Daily  . midodrine  2.5 mg Oral BID WC  . [START ON 07/26/2014] midodrine  5 mg Oral Q M,W,F-HD  . multivitamin  1 tablet Oral QHS  . pantoprazole (PROTONIX) IV  40 mg Intravenous Q12H  . sodium chloride  3 mL Intravenous Q12H   Continuous Infusions:  PRN Meds:.acetaminophen **OR** acetaminophen, albuterol, guaiFENesin-dextromethorphan, loperamide, nitroGLYCERIN, ondansetron **OR** ondansetron (ZOFRAN) IV    PHYSICAL EXAM: Vital signs in last 24 hours: Filed Vitals:   07/25/14 0400 07/25/14 0611 07/25/14 0700 07/25/14 0737  BP: 114/33 126/56 134/41   Pulse: 54 53 56 56  Temp:   98.4 F (36.9 C)   TempSrc:   Oral Oral  Resp: 24 16 17    Height:      Weight:      SpO2: 97% 100% 95%     Weight change:  Filed Weights   07/22/14 2207 07/23/14 0812 07/23/14 1250  Weight: 54.03 kg (119 lb 1.8 oz) 56.7 kg (125 lb) 52.8 kg (116 lb 6.5 oz)   Body mass index is 19.97 kg/(m^2).   Gen Exam: Awake and alert with clear speech.   Neck: Supple, No JVD.   Chest: B/L Clear.  No rales CVS: S1 S2 Regular, +3/6 syst murmur Abdomen: soft, BS +, non tender, non distended.  Extremities: no edema, lower extremities warm to touch. Neurologic: Non Focal.   Skin: No Rash.   Wounds: N/A.    Intake/Output from previous day:  Intake/Output Summary (Last 24 hours) at 07/25/14 0850 Last data filed at 07/25/14 0019  Gross per 24 hour  Intake 714.58 ml  Output      0 ml  Net 714.58 ml     LAB RESULTS: CBC  Recent Labs Lab 07/22/14 0540 07/23/14 0629 07/24/14 0331 07/24/14 1605 07/25/14 0506  WBC 7.5 8.5 7.3 8.2 7.0  HGB 8.8* 10.2* 8.7* 10.3* 9.1*  HCT 29.3* 33.7* 30.0* 34.0* 31.2*  PLT 174 173 198 225 215  MCV 97.3 97.4 98.7 99.7 97.8  MCH 29.2 29.5 28.6 30.2 28.5  MCHC 30.0 30.3 29.0* 30.3 29.2*  RDW 21.0* 20.2* 19.9* 19.8* 19.2*    Chemistries   Recent Labs Lab 07/21/14 0351 07/22/14 0540 07/23/14 0629 07/24/14 1605 07/25/14 0506  NA 136* 138 133* 134* 135*  K 5.0 4.2 5.1 4.1 4.2  CL 94* 98 93* 90* 95*  CO2 25 26 23 24 24   GLUCOSE 173* 168* 184* 181* 127*  BUN 23 13 23 14 18   CREATININE 3.73* 2.57* 3.79* 2.93* 3.76*  CALCIUM 9.1 9.0 9.1 9.1 8.8    CBG:  Recent Labs Lab 07/23/14 2136 07/24/14 0815 07/24/14 1222 07/24/14 1552 07/24/14 2125  GLUCAP 77 82 142* 184* 183*    GFR Estimated Creatinine Clearance: 11.4 mL/min (by C-G formula based on Cr of 3.76).  Coagulation profile  Recent Labs Lab 07/19/14 1839  INR 1.19    Cardiac Enzymes  Recent Labs Lab 07/24/14 1605 07/24/14 2204 07/25/14 0506  TROPONINI 0.60* 0.45* <0.30    Invalid input(s): POCBNP No results for input(s): DDIMER in the last 72 hours. No results for input(s): HGBA1C in the last 72 hours. No results for input(s): CHOL, HDL, LDLCALC, TRIG, CHOLHDL, LDLDIRECT in the last 72 hours. No results for input(s): TSH, T4TOTAL, T3FREE, THYROIDAB in the last 72 hours.  Invalid input(s): FREET3 No results for input(s): VITAMINB12, FOLATE, FERRITIN, TIBC, IRON, RETICCTPCT in the last 72 hours. No results for input(s): LIPASE, AMYLASE in the last 72 hours.  Urine Studies No results for input(s): UHGB, CRYS in the last 72 hours.  Invalid input(s): UACOL, UAPR,  USPG, UPH, UTP, UGL, UKET, UBIL, UNIT, UROB, ULEU, UEPI, UWBC, URBC, UBAC, CAST, UCOM, BILUA  MICROBIOLOGY: Recent Results (from the past 240 hour(s))  MRSA PCR Screening     Status: Abnormal   Collection Time: 07/19/14  3:34 PM  Result Value Ref Range Status   MRSA by PCR POSITIVE (A) NEGATIVE Final    Comment:        The GeneXpert MRSA Assay (FDA approved for NASAL specimens only), is one component of a comprehensive MRSA colonization surveillance program. It is not intended to diagnose MRSA infection nor to guide or monitor treatment for MRSA infections. RESULT CALLED TO, READ BACK BY AND VERIFIED WITH: HOWELL RN  17:30 07/19/14 ([email or web address])   Clostridium Difficile by PCR  Status: None   Collection Time: 07/19/14 10:23 PM  Result Value Ref Range Status   C difficile by pcr NEGATIVE NEGATIVE Final    RADIOLOGY STUDIES/RESULTS: Dg Chest Port 1 View  07/19/2014   CLINICAL DATA:  Shortness of breath, history CHF, end-stage renal disease on dialysis, diabetes mellitus, hypertension, hyperlipidemia, COPD, coronary artery disease, paroxysmal atrial fibrillation, former smoker  EXAM: PORTABLE CHEST - 1 VIEW  COMPARISON:  Portable exam 1801 hr compared to 07/19/2014 at 1056 hr  FINDINGS: Enlargement of cardiac silhouette post CABG.  Pulmonary vascular congestion.  RIGHT pleural effusion and basilar atelectasis.  Central peribronchial thickening.  No gross pulmonary infiltrate, LEFT pleural effusion, or pneumothorax.  IMPRESSION: Enlargement of cardiac silhouette with pulmonary vascular congestion.  Bronchitic changes with persistent RIGHT pleural effusion and basilar atelectasis.   Electronically Signed   By: Ulyses SouthwardMark  Boles M.D.   On: 07/19/2014 18:23    Jeoffrey MassedGHIMIRE,Mcihael Hinderman, MD  [facility] Pager:[phone]  If 7PM-7AM, please contact night-coverage [email or web address] Password [email or web address] 07/25/2014, 8:50 AM   LOS: 6 days

## 2014-07-25 NOTE — Progress Notes (Signed)
Patient ID: [name], female   DOB: 02-27-1941, 73 y.o.   MRN: [id number]  Please refer to the complete consultation note done July 24, 2014 by Dr. Excell Seltzerooper. . Cardiology will sign off.  Jerral BonitoJeff Kynadie Yaun, MD

## 2014-07-25 NOTE — Progress Notes (Signed)
[facility] Rounding Note Subjective:   Events noted with patient discharge, subsequent syncopal episode, then re-admission with hypotension into the 60's Notably occurred on a non-dialysis day - not associated with fluid removal at dialysis, and above her EDW Seen by cardiology (Dr. Excell Seltzerooper) who again reviewed her case (severe inoperable AS, CAD, PAF, not much to offer - did offer consideration of addition of midodrine - now on a low dose)  Seems a little more somnolent to me today but doses wake up and converse  Objective: Vital signs in last 24 hours: Temp:  [98.7 F (37.1 C)-99.9 F (37.7 C)] 98.7 F (37.1 C) (11/26 0300) Pulse Rate:  [52-105] 53 (11/26 0611) Resp:  [14-27] 16 (11/26 0611) BP: (67-158)/(25-72) 126/56 mmHg (11/26 0611) SpO2:  [84 %-100 %] 100 % (11/26 0611) FiO2 (%):  [4 %] 4 % (11/25 0700) Weight change:   EXAM: General appearance: Sleepy, but awakens - just more somnolent than when I saw her yesterday Resp:Anteriorly clear Coarse crackles velcro like bases Cardio: RRR with Gr III/VI systolic murmur heard across precordium and into her back, no rub GI: + BS, soft and nontender Extremities: No edema noted. SCD's in place Access: AVF @ LUA with + bruit and bandaids still in place from 11/24 dialysis treatment  Lab Results:  Recent Labs  07/24/14 1605 07/25/14 0506  WBC 8.2 7.0  HGB 10.3* 9.1*  HCT 34.0* 31.2*  PLT 225 215   BMET:   Recent Labs  07/23/14 0629 07/24/14 1605 07/25/14 0506  NA 133* 134* 135*  K 5.1 4.1 4.2  CL 93* 90* 95*  CO2 23 24 24   GLUCOSE 184* 181* 127*  BUN 23 14 18   CREATININE 3.79* 2.93* 3.76*  CALCIUM 9.1 9.1 8.8  ALBUMIN 3.0*  --  2.9*   Scheduled Medications . amiodarone  200 mg Oral Daily  . antiseptic oral rinse  7 mL Mouth Rinse BID  . atorvastatin  40 mg Oral QHS  . calcitRIOL  0.5 mcg Oral Q M,W,F-HD  . [START ON 07/31/2014] darbepoetin (ARANESP) injection - DIALYSIS  100 mcg Intravenous  Q Wed-HD  . insulin aspart  0-9 Units Subcutaneous TID WC  . loratadine  10 mg Oral Daily  . midodrine  2.5 mg Oral BID WC  . multivitamin  1 tablet Oral QHS  . pantoprazole (PROTONIX) IV  40 mg Intravenous Q12H  . sodium chloride  3 mL Intravenous Q12H    HD: MWF [facility] 4hr 30 mins 51.5kgs 2K/2.25ca No Heparin L AVF 160 350/1.5  Calcitriol 0.5 mcg Aranesp 100  Profile 4  Assessment/Plan:  Syncope leading to immediate hospital readmission - in setting of severe AS, hypotension. Notably occurred on a non-dialysis day - not associated with fluid removal at dialysis, and she is above her EDW.  Seen by cardiology (Dr. Excell Seltzerooper) who again reviewed her case (severe inoperable AS, CAD, PAF, not much to offer - did offer consideration of addition of midodrine - now on a low dose). He offered palliation - put wanted to continue all, but does remain DNR  S/p GI bleed from gastritis (reason for last admission) ( EGD 11/24) . Off Plavix. On ASA. Hb today 9.1. (transfused 11/21) Watch Hb  ESRD -  HD on MWF @ [facility], No Heparin. Last HD was 11/24 per holiday schedule. Next HD will be on Friday. Now on midodrine. Perhaps this will help w/BP support. Can't get to EDW (and as a result some chronic volume overload that we  can't get to, which worsens her pulm issues with chronic O2 dependent COPD). Will give additional 5 mg midodrine doses prior to HD  Severe inoperable AS - as above  CAD - as above  BP/Volume - as per above discussions. EDW 51.5 kg, No weight since post HD on 11/24 of 52.8. Last xray 11/20- vasc congestion, R effusion  Secondary hyperpara - calcitriol. No binders  Malnutrition - suppls. Renal vitamin  Severe COPD O2 dependent  DNR status  Camille Balynthia Tymarion Everard, MD [facility] [phone] Pager 07/25/2014, 6:57 AM

## 2014-07-25 NOTE — Progress Notes (Signed)
Attempted to ambulate with patient. Patient very weak. Used most of my strength to transition her to the bedside commode. Will require more than 1 assist to ambulate with this patient at this time. Patient stated that she stays alone, but has a friend that comes spend the night when she needs them to. Pt stated that she has been to SNF before.   PT to see patient, appreciate their recommendation.   Rise PaganiniURRY, Bruin Bolger R, RN

## 2014-07-26 LAB — GLUCOSE, CAPILLARY
GLUCOSE-CAPILLARY: 89 mg/dL (ref 70–99)
Glucose-Capillary: 162 mg/dL — ABNORMAL HIGH (ref 70–99)
Glucose-Capillary: 89 mg/dL (ref 70–99)

## 2014-07-26 MED ORDER — MIDODRINE HCL 5 MG PO TABS
ORAL_TABLET | ORAL | Status: AC
  Filled 2014-07-26: qty 1

## 2014-07-26 MED ORDER — AMIODARONE HCL 400 MG PO TABS: 200.0000 mg | ORAL_TABLET | Freq: Every day | ORAL | Status: DC

## 2014-07-26 MED ORDER — MIDODRINE HCL 2.5 MG PO TABS: 2.5000 mg | ORAL_TABLET | Freq: Two times a day (BID) | ORAL | Status: DC

## 2014-07-26 MED ORDER — MIDODRINE HCL 5 MG PO TABS: 5.0000 mg | ORAL_TABLET | ORAL | Status: DC

## 2014-07-26 MED ORDER — ASPIRIN 81 MG PO CHEW
81.0000 mg | CHEWABLE_TABLET | Freq: Every day | ORAL | Status: DC
  Administered 2014-07-26: 81 mg via ORAL
  Filled 2014-07-26 (×2): qty 1

## 2014-07-26 MED ORDER — PANTOPRAZOLE SODIUM 40 MG PO TBEC
40.0000 mg | DELAYED_RELEASE_TABLET | Freq: Two times a day (BID) | ORAL | Status: DC
  Administered 2014-07-26: 40 mg via ORAL
  Filled 2014-07-26: qty 1

## 2014-07-26 MED ORDER — NEPRO/CARBSTEADY PO LIQD: 237.0000 mL | ORAL | Status: AC | PRN

## 2014-07-26 NOTE — Clinical Social Work Psychosocial (Signed)
Clinical Social Work Department BRIEF PSYCHOSOCIAL ASSESSMENT 07/26/2014  Patient:  [name],[name]     Account Number:  [id number]     Admit date:  07/19/2014  Clinical Social Worker:  Delmer IslamRAWFORD,Eathon Valade, LCSW  Date/Time:  07/26/2014 03:38 AM  Referred by:  Physician  Date Referred:  07/25/2014 Referred for  SNF Placement   Other Referral:   Interview type:  Patient Other interview type:   CSW also spoke wth patients mother [name] [phone]) and uncle [name], who were at the bedside.    PSYCHOSOCIAL DATA Living Status:  ALONE Admitted from facility:   Level of care:   Primary support name:  [name] Primary support relationship to patient:  CHILD, ADULT Degree of support available:   Patient's son and daughter-in-law [name] supportive per patient's mother. Son cuts hair at [facility] and daughter-in-law works at facility.    CURRENT CONCERNS Current Concerns  Post-Acute Placement   Other Concerns:    SOCIAL WORK ASSESSMENT / PLAN CSW talked with patient regarding d/c plans and recommendation of ST rehab. Patient was also informed that she was ready for discharge today. At the bedside was patient's mother, [name] and her uncle [name]. Patient was sleepy and hard to keep awake, however she did verbally agree to go to short-term rehab. Initially patient said she was going home, but her mother and uncle both talked with her about not having anyone at home to help her.   Assessment/plan status:  Psychosocial Support/Ongoing Assessment of Needs Other assessment/ plan:   Information/referral to community resources:   None needed or requested at this time.    PATIENT'S/FAMILY'S RESPONSE TO PLAN OF CARE: Patient was sleepy and not very talkative, but she did verbally agree to short-term rehab.

## 2014-07-26 NOTE — Progress Notes (Addendum)
PATIENT DETAILS Name: [name] Age: 73 y.o. Sex: female Date of Birth: September 27, 1940 Admit Date: 07/19/2014 Admitting Physician Cassie Art, DO ZOX:WRUEA, Cassie Galas, MD  Brief Summary Patient is a 73 year old unfortunate female with a history of end-stage renal disease on hemodialysis, severe aortic stenosis not a candidate for any form of intervention who was admitted on 11/20 upper GI bleed and acute blood loss anemia. Since she was on Plavix, EGD was delayed for Plavix washout, underwent EGD on 11/24 which showed chronic gastritis. Post EGD, she transiently became hypotensive and hypoxic, that was managed with just supportive care. She was eventually discharged on 11/25, however she sustained a syncopal episode while in the hospital elevator. Cardiology was consulted to see if any further recommendations, per cardiology, nothing further to offer. Unfortunately, patient has now become significantly deconditioned and is weak. She will now require SNF placement. She remains a DO NOT RESUSCITATE, however the patient is not willing to accept hospice care yet.  Subjective: No complaints this am.Seen in HD.Per RN no major events overnight.  Assessment/Plan: Principal Problem:   Syncope: occurred immediately after being discharged. Multifactorial-Severe AS, ESRD, probable autonomic dysfunction. Seen by cards on 11/25-no further interventions or recommendations-apart from Midodrine. Plan on 2.5 mg BID on non HD days, and 5 mg BID on HD days. Unfortunately significantly deconditioned and weak, suspect will require SNF placement, patient agreeable. Social worker consulted. In regards to her syncope/AS unfortunately nothing much to offer apart from hospice care, for which she is not yet ready. Understands poor overall prognoses.Remains a DNR.   Elevated Troponin:secondary to demand ischemia/hypotension. Not a candidate for further intervention.  UGI Bleed:Resolved. Continue PPI, Transfused 1  unit of PRBC on day of admit. Has prior hx of Duodenal Ulcer.Stopped ASA/Plavix for now. GI consulted,  EGD on 11/24 showed chronic gastritis. Hb remains stable.  Active Problems:   Acute Blood loss anemia: has acute blood loss anemia on top of anemia of chronic kidney disease. Hb dropped to 7.4 on admission, given one unit of PRBC. Hb remained stable post transfusion. Continue to follow CBC and transfuse prn.   Diarrhea:C Diff PCR negative. Supportive care-Imodium prn.    Paroxysmal atrial fibrillation: Continue amiodarone, not on Coumadin because of recent PCI/now GI bleed. Will only maintain on ASA.     ESRD (end stage renal disease): On hemodialysis M/W/F. Renal consulted.   DM (diabetes mellitus), type 2 with renal complications:CBG's stable, c/w SSI   Severe AS:per prior cardiology note -not a candidate for any intervention   CAD- CABG X 3 09/2009, HSRA/DES to native RCA 07/11/13: Held aspirin/Plavix on admission-given patient is one year out from PCI-and with GI bleed-will only place on ASA.  Disposition: Remain inpatient-suspect SNF when bed available  Antibiotics:  Anti-infectives    None     DVT Prophylaxis: SCD's  Code Status:  DNR   Family Communication [name] bedside on 11/25  Procedures:  None  CONSULTS:  GI, Cardiology and nephrology   MEDICATIONS: Scheduled Meds: . amiodarone  200 mg Oral Daily  . antiseptic oral rinse  7 mL Mouth Rinse BID  . atorvastatin  40 mg Oral QHS  . calcitRIOL  0.5 mcg Oral Q M,W,F-HD  . [START ON 07/31/2014] darbepoetin (ARANESP) injection - DIALYSIS  100 mcg Intravenous Q Wed-HD  . insulin aspart  0-9 Units Subcutaneous TID WC  . loratadine  10 mg Oral Daily  . midodrine  2.5 mg Oral  BID WC  . midodrine  5 mg Oral Q M,W,F-HD  . multivitamin  1 tablet Oral QHS  . pantoprazole  40 mg Oral BID  . sodium chloride  3 mL Intravenous Q12H   Continuous Infusions:  PRN Meds:.sodium chloride, sodium chloride,  acetaminophen **OR** acetaminophen, albuterol, feeding supplement (NEPRO CARB STEADY), guaiFENesin-dextromethorphan, heparin, lidocaine (PF), lidocaine-prilocaine, loperamide, nitroGLYCERIN, ondansetron **OR** ondansetron (ZOFRAN) IV, pentafluoroprop-tetrafluoroeth    PHYSICAL EXAM: Vital signs in last 24 hours: Filed Vitals:   07/26/14 0808 07/26/14 0813 07/26/14 0830 07/26/14 0900  BP: 134/52 124/55 120/53 116/39  Pulse: 60 60 62 63  Temp: 98 F (36.7 C)     TempSrc: Oral     Resp: 18     Height:      Weight: 55.2 kg (121 lb 11.1 oz)     SpO2: 97%       Weight change:  Filed Weights   07/23/14 0812 07/23/14 1250 07/26/14 0808  Weight: 56.7 kg (125 lb) 52.8 kg (116 lb 6.5 oz) 55.2 kg (121 lb 11.1 oz)   Body mass index is 20.88 kg/(m^2).   Gen Exam: Awake and alert with clear speech.   Neck: Supple, No JVD.   Chest: B/L Clear.  No rales or rhonchi CVS: S1 S2 Regular, +3/6 syst murmur Abdomen: soft, BS +, non tender, non distended.  Extremities: no edema, lower extremities warm to touch. Neurologic: Non Focal.   Skin: No Rash.   Wounds: N/A.   Intake/Output from previous day:  Intake/Output Summary (Last 24 hours) at 07/26/14 0910 Last data filed at 07/25/14 1900  Gross per 24 hour  Intake    440 ml  Output      0 ml  Net    440 ml     LAB RESULTS: CBC  Recent Labs Lab 07/22/14 0540 07/23/14 0629 07/24/14 0331 07/24/14 1605 07/25/14 0506  WBC 7.5 8.5 7.3 8.2 7.0  HGB 8.8* 10.2* 8.7* 10.3* 9.1*  HCT 29.3* 33.7* 30.0* 34.0* 31.2*  PLT 174 173 198 225 215  MCV 97.3 97.4 98.7 99.7 97.8  MCH 29.2 29.5 28.6 30.2 28.5  MCHC 30.0 30.3 29.0* 30.3 29.2*  RDW 21.0* 20.2* 19.9* 19.8* 19.2*    Chemistries   Recent Labs Lab 07/21/14 0351 07/22/14 0540 07/23/14 0629 07/24/14 1605 07/25/14 0506  NA 136* 138 133* 134* 135*  K 5.0 4.2 5.1 4.1 4.2  CL 94* 98 93* 90* 95*  CO2 25 26 23 24 24   GLUCOSE 173* 168* 184* 181* 127*  BUN 23 13 23 14 18   CREATININE  3.73* 2.57* 3.79* 2.93* 3.76*  CALCIUM 9.1 9.0 9.1 9.1 8.8    CBG:  Recent Labs Lab 07/25/14 0741 07/25/14 1136 07/25/14 1618 07/25/14 2145 07/26/14 0737  GLUCAP 119* 219* 152* 133* 162*    GFR Estimated Creatinine Clearance: 11.9 mL/min (by C-G formula based on Cr of 3.76).  Coagulation profile  Recent Labs Lab 07/19/14 1839  INR 1.19    Cardiac Enzymes  Recent Labs Lab 07/24/14 1605 07/24/14 2204 07/25/14 0506  TROPONINI 0.60* 0.45* <0.30    Invalid input(s): POCBNP No results for input(s): DDIMER in the last 72 hours. No results for input(s): HGBA1C in the last 72 hours. No results for input(s): CHOL, HDL, LDLCALC, TRIG, CHOLHDL, LDLDIRECT in the last 72 hours. No results for input(s): TSH, T4TOTAL, T3FREE, THYROIDAB in the last 72 hours.  Invalid input(s): FREET3  Recent Labs  07/25/14 0506  TIBC 186*  IRON 30*  No results for input(s): LIPASE, AMYLASE in the last 72 hours.  Urine Studies No results for input(s): UHGB, CRYS in the last 72 hours.  Invalid input(s): UACOL, UAPR, USPG, UPH, UTP, UGL, UKET, UBIL, UNIT, UROB, ULEU, UEPI, UWBC, URBC, UBAC, CAST, UCOM, BILUA  MICROBIOLOGY: Recent Results (from the past 240 hour(s))  MRSA PCR Screening     Status: Abnormal   Collection Time: 07/19/14  3:34 PM  Result Value Ref Range Status   MRSA by PCR POSITIVE (A) NEGATIVE Final    Comment:        The GeneXpert MRSA Assay (FDA approved for NASAL specimens only), is one component of a comprehensive MRSA colonization surveillance program. It is not intended to diagnose MRSA infection nor to guide or monitor treatment for MRSA infections. RESULT CALLED TO, READ BACK BY AND VERIFIED WITH: HOWELL RN  17:30 07/19/14 ([email or web address])   Clostridium Difficile by PCR     Status: None   Collection Time: 07/19/14 10:23 PM  Result Value Ref Range Status   C difficile by pcr NEGATIVE NEGATIVE Final    RADIOLOGY STUDIES/RESULTS: Dg Chest Port 1  View  07/19/2014   CLINICAL DATA:  Shortness of breath, history CHF, end-stage renal disease on dialysis, diabetes mellitus, hypertension, hyperlipidemia, COPD, coronary artery disease, paroxysmal atrial fibrillation, former smoker  EXAM: PORTABLE CHEST - 1 VIEW  COMPARISON:  Portable exam 1801 hr compared to 07/19/2014 at 1056 hr  FINDINGS: Enlargement of cardiac silhouette post CABG.  Pulmonary vascular congestion.  RIGHT pleural effusion and basilar atelectasis.  Central peribronchial thickening.  No gross pulmonary infiltrate, LEFT pleural effusion, or pneumothorax.  IMPRESSION: Enlargement of cardiac silhouette with pulmonary vascular congestion.  Bronchitic changes with persistent RIGHT pleural effusion and basilar atelectasis.   Electronically Signed   By: Ulyses SouthwardMark  Boles M.D.   On: 07/19/2014 18:23    Jeoffrey MassedGHIMIRE,SHANKER, MD  [facility] Pager:[phone]  If 7PM-7AM, please contact night-coverage [email or web address] Password [email or web address] 07/26/2014, 9:10 AM   LOS: 7 days

## 2014-07-26 NOTE — Progress Notes (Signed)
PT Cancellation Note  Patient Details Name: Hadassah Paiseggy A Engelbrecht MRN: [id number] DOB: [birth year]   Cancelled Treatment:    Reason Eval/Treat Not Completed: Fatigue/lethargy limiting ability to participate.   Pt keeping eyes closed when therapist was speaking to her, not verbally responding to therapist's questions. With gentle stimuli at the leg, pt states "I can hear you". Would not say anything else. Will continue to follow.    Conni SlipperKirkman, Decarla Siemen 07/26/2014, 4:14 PM   Conni SlipperLaura Fatih Stalvey, PT, DPT Acute Rehabilitation Services Pager: [phone]

## 2014-07-26 NOTE — Clinical Social Work Placement (Signed)
Clinical Social Work Department CLINICAL SOCIAL WORK PLACEMENT NOTE 07/26/2014  Patient:  [name],[name]  Account Number:  [id number] Admit date:  07/19/2014  Clinical Social Worker:  Genelle BalVANESSA Adriella Essex, LCSW  Date/time:  07/26/2014 03:46 AM  Clinical Social Work is seeking post-discharge placement for this patient at the following level of care:   SKILLED NURSING   (*CSW will update this form in Epic as items are completed)   07/26/2014  Patient/family provided with [facility] Department of Clinical Social Work's list of facilities offering this level of care within the geographic area requested by the patient (or if unable, by the patient's family).  07/26/2014  Patient/family informed of their freedom to choose among providers that offer the needed level of care, that participate in Medicare, Medicaid or managed care program needed by the patient, have an available bed and are willing to accept the patient.    Patient/family informed of [facility]' ownership interest in [facility], as well as of the fact that they are under no obligation to receive care at this facility.  PASARR submitted to EDS in 2011  PASARR number received in 2011   FL2 transmitted to all facilities in geographic area requested by pt/family on  07/26/2014 FL2 transmitted to all facilities within larger geographic area on   Patient informed that his/her managed care company has contracts with or will negotiate with  certain facilities, including the following:     Patient/family informed of bed offers received:  07/26/2014 [facility] Physician recommends and patient chooses bed at    [facility] on  07/26/2014 Patient to be transferred to facility by ambulance Patient and family notified of transfer on 07/26/2014 Name of family member notified:  Mother, Lynnda Childaige Dezarn and uncle Patrick JupiterJohn Novelo  The following physician request  were entered in Epic:   Additional Comments:

## 2014-07-26 NOTE — Progress Notes (Signed)
Pt prepared for d/c to SNF. IV d/c'd. Skin intact except as most recently charted. Vitals are stable. [facility] (receiving [facility]). Pt to be transported by ambulance service.  Peri MarisAndrew Krimson Massmann, MBA, BS, RN

## 2014-07-26 NOTE — Care Management Note (Signed)
CARE MANAGEMENT NOTE 07/26/2014  Patient:  [name], [name]   Account Number:  [id number]  Date Initiated:  07/22/2014  Documentation initiated by:  Hiedi Touchton  Subjective/Objective Assessment:   CM following for progression and d/c planning. Pt lives alone and plans to return to home.     Action/Plan:   07/22/14 Met with pt who has Baker walker and canes, has used [place] for [facility] previoulys and wishes to use that agency again if needed.  07/27/2015 Plan now for d/c to [facility]   Anticipated DC Date:  07/26/2014   Anticipated DC Plan:  [place]  CM consult      Choice offered to / List presented to:          [facility] arranged  HH-2 PT      [place]   Status of service:  Completed, signed off Medicare Important Message given?  YES (If response is "NO", the following Medicare IM given date fields will be blank) Date Medicare IM given:  07/22/2014 Medicare IM given by:  Aranza Geddes Date Additional Medicare IM given:  07/26/2014 Additional Medicare IM given by:  [facility]  Discharge Disposition:  [place]  Per UR Regulation:  Reviewed for med. necessity/level of care/duration of stay  If discussed at [place] of Stay Meetings, dates discussed:    Comments:  07/24/14 [place] liaison that pt is discharging today.

## 2014-07-26 NOTE — Procedures (Signed)
I have personally attended this patient's dialysis session.   2K bath pending labs BFR 350 No heparin 4.5 hour treatment time No weights. Goal 3.5 Received midodrine pre HD  Camille Balynthia Birttany Dechellis, MD [facility] [phone] Pager 07/26/2014, 8:53 AM

## 2014-07-26 NOTE — Progress Notes (Signed)
[name] Rounding Note Subjective:   Seen in dialysis today Very weak - Nurse attempted to ambulate her earlier with great difficulty - will require more than 1 assist to ambulate SNF will probably be pursued as pt lives alone. Though son comes in, that will not be enough  Objective: Vital signs in last 24 hours: Temp:  [98 F (36.7 C)-99.5 F (37.5 C)] 98 F (36.7 C) (11/27 0808) Pulse Rate:  [53-62] 62 (11/27 0830) Resp:  [16-21] 18 (11/27 0808) BP: (98-134)/(24-67) 120/53 mmHg (11/27 0830) SpO2:  [95 %-99 %] 97 % (11/27 0808) Weight:  [55.2 kg (121 lb 11.1 oz)] 55.2 kg (121 lb 11.1 oz) (11/27 0808) Weight change:   EXAM: General appearance: Awakens, weak, "I'm just fine" Resp:Anteriorly clear Coarse crackles velcro like bases Cardio: RRR with Gr III/VI systolic murmur heard across precordium and into her back, no rub GI: + BS, soft and nontender Extremities: No edema noted. SCD's in place Access: AVF @ LUA cannulated and with BFR of 350  Lab Results: Pre dialysis labs are still pending  Recent Labs  07/24/14 1605 07/25/14 0506  WBC 8.2 7.0  HGB 10.3* 9.1*  HCT 34.0* 31.2*  PLT 225 215     Recent Labs  07/24/14 1605 07/25/14 0506  NA 134* 135*  K 4.1 4.2  CL 90* 95*  CO2 24 24  GLUCOSE 181* 127*  BUN 14 18  CREATININE 2.93* 3.76*  CALCIUM 9.1 8.8  ALBUMIN  --  2.9*  Phosphorus                       4.5  Scheduled Medications . amiodarone  200 mg Oral Daily  . antiseptic oral rinse  7 mL Mouth Rinse BID  . atorvastatin  40 mg Oral QHS  . calcitRIOL  0.5 mcg Oral Q M,W,F-HD  . [START ON 07/31/2014] darbepoetin (ARANESP) injection - DIALYSIS  100 mcg Intravenous Q Wed-HD  . insulin aspart  0-9 Units Subcutaneous TID WC  . loratadine  10 mg Oral Daily  . midodrine  2.5 mg Oral BID WC  . midodrine  5 mg Oral Q M,W,F-HD  . multivitamin  1 tablet Oral QHS  . pantoprazole  40 mg Oral BID  . sodium chloride  3 mL Intravenous Q12H     HD: MWF [facility] 4hr 30 mins 51.5kgs 2K/2.25ca No Heparin L AVF 160 350/1.5  Calcitriol 0.5 mcg Aranesp 100  Profile 4  Assessment/Plan:  Syncope leading to immediate hospital readmission 11/25 - in setting of severe AS, hypotension. Notably occurred on a non-dialysis day - not associated with fluid removal at dialysis, and she is above her EDW.  Seen by cardiology (Dr. Excell Seltzerooper) who again reviewed her case (severe inoperable AS, CAD, PAF, not much to offer - did offer consideration of addition of midodrine - now on a low dose). He offered palliation - put wanted to continue all, but does remain DNR  S/p GI bleed from gastritis (reason for last admission) ( EGD 11/24) . Off Plavix. On ASA. Last 9.1. (transfused 11/21) Today's lab still pending Watch Hb  ESRD -  HD on MWF @ [facility], No Heparin. HD today now back on MWF schedule. Now on midodrine. Perhaps this will help w/BP support. Can't get to EDW (and as a result some chronic volume overload that we can't get to, which worsens her pulm issues with chronic O2 dependent COPD). Will give additional 5 mg midodrine doses prior to  HD  Severe inoperable AS - as above  CAD - as above  BP/Volume - as per above discussions. EDW 51.5 kg, No weight since post HD on 11/24 of 52.8. Last xray 11/20- vasc congestion, R effusion. Goal 3.5 kg today  Secondary hyperpara - calcitriol. No binders. Phosphorus 4.5 on 11/26  Malnutrition - suppls. Renal vitamin  Severe COPD O2 dependent  DNR status  Camille Balynthia Addam Goeller, MD [facility] [phone] Pager 07/26/2014, 8:43 AM

## 2014-07-27 MED FILL — Medication: Qty: 1 | Status: AC

## 2014-08-08 ENCOUNTER — Ambulatory Visit: Payer: Medicare Other | Admitting: Cardiovascular Disease

## 2014-08-08 ENCOUNTER — Encounter (HOSPITAL_COMMUNITY): Payer: Self-pay | Admitting: Cardiovascular Disease

## 2014-08-29 ENCOUNTER — Ambulatory Visit (INDEPENDENT_AMBULATORY_CARE_PROVIDER_SITE_OTHER): Payer: Medicare Other | Admitting: Cardiology

## 2014-08-29 ENCOUNTER — Other Ambulatory Visit: Payer: Self-pay | Admitting: *Deleted

## 2014-08-29 ENCOUNTER — Encounter: Payer: Self-pay | Admitting: Cardiology

## 2014-08-29 VITALS — BP 90/50 | HR 60 | Ht 64.0 in | Wt 121.0 lb

## 2014-08-29 DIAGNOSIS — I739 Peripheral vascular disease, unspecified: Secondary | ICD-10-CM

## 2014-08-29 MED ORDER — MIDODRINE HCL 5 MG PO TABS: 5.0000 mg | ORAL_TABLET | ORAL | Status: DC

## 2014-08-29 NOTE — Patient Instructions (Signed)
Your physician recommends that you schedule a follow-up appointment in: 3 months with Dr. Tresa EndoKelly  We are ordering  A doppler test for you to get done

## 2014-08-29 NOTE — Progress Notes (Signed)
08/29/2014 [name]   March 26, 1941  [id number]  Primary Physician Galvin ProfferHAGUE, IMRAN P, MD Primary [facility]ologist: [facility]  The patient presents to clinic for post hospital f/u. She has extensive cardiovascular history. Details regarding her history and recent hospital course are outlined in detail below.   HPI:  The patient is a 73 year old African-American female with a past medical history of ESRD on HD M/W/F, severe aortic stenosis, CAD status post CABG in 09/2009 and PCI in 06/2013, recurrent paroxysmal atrial fibrillation, hypertension, diabetes, COPD, and history of CHF. Her last cardiac catheterization on 07/06/2013 showed 60% proximal LAD stenosis, 80% D1 stenosis, 40% proximal RCA stenosis, 90% mid RCA stenosis, patent LIMA to LAD, occluded SVG to circumflex marginal, occluded SVG to PDA and RCA. Her last echocardiogram obtained in February 2015 showed EF 60-65%, moderate to severe aortic stenosis, valve area 0.45 cm, mild MR, mildly dilated left atrium, PA peak pressure 52. She was previously on amiodarone for her recurrent atrial fibrillation. During her last office visit on 03/18/2014, her amiodarone dose was decreased down to 100 mg daily along with her diltiazem. She was admitted in September for near-syncope and bradycardia with heart rate in the 30s. She also had persistent hypotension during the last admission. Her diltiazem was discontinued. She had some SVT during the last admission mainly during dialysis and was reloaded with amiodarone. She was discharged on amiodarone alone without rate control medication due to hypotension.  Patient was [facility]2014 was melena after dialysis. GI was consulted. Her aspirin and Plavix was discontinued as her previous PCI was done over a year ago. Patient eventually underwent upper endoscopy on 07/23/2014 which showed gastritis as possible cause of her GI bleed. She was discharged on 11/24 on aspirin alone  without Plavix. However as patient was sitting in wheelchair been pushed to her car to leave the hospital when she had a syncopal episode.  Initial blood pressure was 67/39. O2 saturation 84% and she was placed on nonrebreather mask. Pulse was in the 50s. EKG showed sinus bradycardia with heart rate in the 50s and right bundle branch block. Cardiology was consulted for syncope. However this was felt subsequent to hypotension in the setting of severe inoperable aortic stenosis. She was seen by Dr. Excell Seltzerooper who felt that there was noting we could do to alter her extremely poor prognosis at this point. He discussed hospice/palliative care with the patient and her son, however the patient was not interested in this. It was decided to add midodrine to support BP. It was decided to not give fluids as she was above her dry weight. She was instructed to take 5 mg of midodrine on HD days and 2.5 mg on non HD days.   Since discharge from the hospital, she reports that she has done fairly well. She denies any recurrent syncope/near syncope. She feels that the midodrine has done well at keeping her BP stable. She denies any recent CP and no further melena. No SOB, orthopnea, PND or LEE. She does however complain of right posterior thigh pain, described as a burning sensation when she walks. As a result, she notes decreased exercise tolerance. Symptoms improve with rest. He grandson, who is with her today, also notes new skin discoloration of her bilateral LEE. He thinks her skin looks darker.    Current Outpatient Prescriptions  Medication Sig Dispense Refill  . amiodarone (PACERONE) 400 MG tablet Take 0.5 tablets (200 mg total) by mouth daily. 30  tablet 1  . aspirin 81 MG chewable tablet Chew 81 mg by mouth daily.     Marland Kitchen. atorvastatin (LIPITOR) 40 MG tablet Take 40 mg by mouth at bedtime.    Marland Kitchen. b complex-vitamin c-folic acid (NEPHRO-VITE) 0.8 MG TABS tablet Take 1 tablet by mouth at bedtime.    . calcitRIOL (ROCALTROL)  0.5 MCG capsule Take 1 capsule (0.5 mcg total) by mouth every Monday, Wednesday, and Friday with hemodialysis. 60 capsule 0  . fexofenadine (ALLEGRA) 180 MG tablet Take 180 mg by mouth daily.    . insulin aspart (NOVOLOG) 100 UNIT/ML injection Inject 2-10 Units into the skin See admin instructions. Sliding scale. 151-200 use 2 units; 201-250 use 4 units; 251-300 use 6 units; 301-350 use 8 units; 351-400 use 10 units    . midodrine (PROAMATINE) 2.5 MG tablet Take 1 tablet (2.5 mg total) by mouth 2 (two) times daily with a meal. on Sunday, Tuesday, Thursday, Saturday    . midodrine (PROAMATINE) 5 MG tablet Take 1 tablet (5 mg total) by mouth every Monday, Wednesday, and Friday with hemodialysis.    Marland Kitchen. nitroGLYCERIN (NITROSTAT) 0.4 MG SL tablet Place 0.4 mg under the tongue every 5 (five) minutes as needed for chest pain.    . Nutritional Supplements (FEEDING SUPPLEMENT, NEPRO CARB STEADY,) LIQD Take 237 mLs by mouth as needed (missed meal during dialysis.).  0  . pantoprazole (PROTONIX) 40 MG tablet Take 1 tablet (40 mg total) by mouth daily. 30 tablet 0   No current facility-administered medications for this visit.    Allergies  Allergen Reactions  . Codeine Other (See Comments)    POSSIBLY RASH?  Marland Kitchen. Sulfa Antibiotics Other (See Comments)    unknown    History   Social History  . Marital Status: Widowed    Spouse Name: N/A    Number of Children: N/A  . Years of Education: N/A   Occupational History  . Retired    Social History Main Topics  . Smoking status: Former Smoker -- 0.50 packs/day    Types: Cigarettes  . Smokeless tobacco: Never Used  . Alcohol Use: No  . Drug Use: No  . Sexual Activity: No   Other Topics Concern  . Not on file   Social History Narrative   The patient currently lives at home with a friend "Trinna Postlex", who helps her around the house.  The patient manages her own medications, using a pill box.     Review of Systems: General: negative for chills, fever,  night sweats or weight changes.  Cardiovascular: negative for chest pain, dyspnea on exertion, edema, orthopnea, palpitations, paroxysmal nocturnal dyspnea or shortness of breath Dermatological: negative for rash Respiratory: negative for cough or wheezing Urologic: negative for hematuria Abdominal: negative for nausea, vomiting, diarrhea, bright red blood per rectum, melena, or hematemesis Neurologic: negative for visual changes, syncope, or dizziness All other systems reviewed and are otherwise negative except as noted above.    Blood pressure 90/50, pulse 60, height 5\' 4"  (1.626 m), weight 121 lb (54.885 kg).  General appearance: alert, cooperative and no distress Neck: no JVD and + radiation of AS murmur  Lungs: clear to auscultation bilaterally Heart: regular rate and rhythm and 3/6 SM heard throughout the precordium Extremities: no LEE Pulses: decreased peripheral pulses bilaterally Skin: warm and dry Neurologic: Grossly normal  EKG Not performed  ASSESSMENT AND PLAN:   1. Lower Extremity Claudication: R>L. Decreased DPs bilaterally. Will obtain bilateral LEAs to assess for PVD. If evidence  of ischemia, will referr to Dr. Allyson Sabal or Dr. Kirke Corin  2. Syncope: 2/2 hypotension in the setting of severe AS. No further epiosodes. Continue Midorine, 5 mg on HD days and 2.5 mg on non HD days.   3. Severe AS: denies further syncope. No anginal symptoms. No dyspnea. She is not a surgical candidate  4. CAD: denies anginal symptoms. Continue medical therapy.   5. Gastritis: Plavix discontinued. Only on ASA. No further melena.  6. ESRD: HD MWF  PLAN  If LEAs are suggestive of PVD, we will refer her to either Dr. Allyson Sabal or Dr. Kirke Corin. Otherwise, she has been instructed to f/u with Dr. Tresa Endo in 3 months.    Fidelia Cathers, BRITTAINYPA-C 08/29/2014 8:02 AM

## 2014-09-06 ENCOUNTER — Encounter (HOSPITAL_COMMUNITY): Payer: Medicare Other

## 2014-09-10 ENCOUNTER — Inpatient Hospital Stay (HOSPITAL_COMMUNITY): Admission: RE | Admit: 2014-09-10 | Payer: Self-pay | Source: Ambulatory Visit

## 2014-09-10 ENCOUNTER — Telehealth (HOSPITAL_COMMUNITY): Payer: Self-pay | Admitting: *Deleted

## 2014-09-12 ENCOUNTER — Other Ambulatory Visit: Payer: Self-pay | Admitting: Cardiology

## 2014-09-12 NOTE — Telephone Encounter (Signed)
Rena-vite refill refused - defer to PCP or nephrologist

## 2014-10-15 ENCOUNTER — Encounter (HOSPITAL_COMMUNITY): Payer: Self-pay | Admitting: *Deleted

## 2014-10-15 ENCOUNTER — Inpatient Hospital Stay (HOSPITAL_COMMUNITY)
Admission: AD | Admit: 2014-10-15 | Discharge: 2014-10-18 | DRG: 189 | Disposition: A | Discharge status: Home / Self Care | Payer: Medicare Other | Source: Other Acute Inpatient Hospital | Attending: Internal Medicine | Admitting: Internal Medicine

## 2014-10-15 DIAGNOSIS — I739 Peripheral vascular disease, unspecified: Secondary | ICD-10-CM | POA: Diagnosis present

## 2014-10-15 DIAGNOSIS — I248 Other forms of acute ischemic heart disease: Secondary | ICD-10-CM | POA: Diagnosis present

## 2014-10-15 DIAGNOSIS — E119 Type 2 diabetes mellitus without complications: Secondary | ICD-10-CM | POA: Diagnosis present

## 2014-10-15 DIAGNOSIS — Z955 Presence of coronary angioplasty implant and graft: Secondary | ICD-10-CM

## 2014-10-15 DIAGNOSIS — I480 Paroxysmal atrial fibrillation: Secondary | ICD-10-CM | POA: Diagnosis present

## 2014-10-15 DIAGNOSIS — Z794 Long term (current) use of insulin: Secondary | ICD-10-CM | POA: Diagnosis not present

## 2014-10-15 DIAGNOSIS — Z885 Allergy status to narcotic agent status: Secondary | ICD-10-CM | POA: Diagnosis not present

## 2014-10-15 DIAGNOSIS — I120 Hypertensive chronic kidney disease with stage 5 chronic kidney disease or end stage renal disease: Secondary | ICD-10-CM | POA: Diagnosis present

## 2014-10-15 DIAGNOSIS — R0602 Shortness of breath: Secondary | ICD-10-CM | POA: Diagnosis present

## 2014-10-15 DIAGNOSIS — Z66 Do not resuscitate: Secondary | ICD-10-CM | POA: Diagnosis present

## 2014-10-15 DIAGNOSIS — I959 Hypotension, unspecified: Secondary | ICD-10-CM | POA: Diagnosis present

## 2014-10-15 DIAGNOSIS — F17210 Nicotine dependence, cigarettes, uncomplicated: Secondary | ICD-10-CM | POA: Diagnosis present

## 2014-10-15 DIAGNOSIS — Z7982 Long term (current) use of aspirin: Secondary | ICD-10-CM

## 2014-10-15 DIAGNOSIS — I509 Heart failure, unspecified: Secondary | ICD-10-CM | POA: Diagnosis present

## 2014-10-15 DIAGNOSIS — E785 Hyperlipidemia, unspecified: Secondary | ICD-10-CM | POA: Diagnosis present

## 2014-10-15 DIAGNOSIS — I2510 Atherosclerotic heart disease of native coronary artery without angina pectoris: Secondary | ICD-10-CM | POA: Diagnosis present

## 2014-10-15 DIAGNOSIS — N2581 Secondary hyperparathyroidism of renal origin: Secondary | ICD-10-CM | POA: Diagnosis present

## 2014-10-15 DIAGNOSIS — I272 Other secondary pulmonary hypertension: Secondary | ICD-10-CM | POA: Diagnosis present

## 2014-10-15 DIAGNOSIS — Z992 Dependence on renal dialysis: Secondary | ICD-10-CM

## 2014-10-15 DIAGNOSIS — J449 Chronic obstructive pulmonary disease, unspecified: Secondary | ICD-10-CM | POA: Diagnosis present

## 2014-10-15 DIAGNOSIS — D649 Anemia, unspecified: Secondary | ICD-10-CM | POA: Diagnosis present

## 2014-10-15 DIAGNOSIS — I350 Nonrheumatic aortic (valve) stenosis: Secondary | ICD-10-CM

## 2014-10-15 DIAGNOSIS — K2950 Unspecified chronic gastritis without bleeding: Secondary | ICD-10-CM | POA: Diagnosis present

## 2014-10-15 DIAGNOSIS — J9601 Acute respiratory failure with hypoxia: Secondary | ICD-10-CM

## 2014-10-15 DIAGNOSIS — I953 Hypotension of hemodialysis: Secondary | ICD-10-CM

## 2014-10-15 DIAGNOSIS — Z951 Presence of aortocoronary bypass graft: Secondary | ICD-10-CM | POA: Diagnosis not present

## 2014-10-15 DIAGNOSIS — J9621 Acute and chronic respiratory failure with hypoxia: Secondary | ICD-10-CM | POA: Diagnosis present

## 2014-10-15 DIAGNOSIS — Z882 Allergy status to sulfonamides status: Secondary | ICD-10-CM | POA: Diagnosis not present

## 2014-10-15 DIAGNOSIS — K2951 Unspecified chronic gastritis with bleeding: Secondary | ICD-10-CM | POA: Diagnosis present

## 2014-10-15 DIAGNOSIS — J90 Pleural effusion, not elsewhere classified: Secondary | ICD-10-CM

## 2014-10-15 DIAGNOSIS — N186 End stage renal disease: Secondary | ICD-10-CM | POA: Diagnosis present

## 2014-10-15 DIAGNOSIS — I50812 Chronic right heart failure: Secondary | ICD-10-CM

## 2014-10-15 LAB — GLUCOSE, CAPILLARY: GLUCOSE-CAPILLARY: 356 mg/dL — AB (ref 70–99)

## 2014-10-15 LAB — TROPONIN I: Troponin I: 0.06 ng/mL — ABNORMAL HIGH (ref <0.031)

## 2014-10-15 MED ORDER — MIDODRINE HCL 5 MG PO TABS
5.0000 mg | ORAL_TABLET | ORAL | Status: DC
  Administered 2014-10-16 – 2014-10-18 (×2): 5 mg via ORAL
  Filled 2014-10-15 (×2): qty 1

## 2014-10-15 MED ORDER — AMIODARONE HCL 200 MG PO TABS
200.0000 mg | ORAL_TABLET | Freq: Every day | ORAL | Status: DC
  Administered 2014-10-16 – 2014-10-18 (×3): 200 mg via ORAL
  Filled 2014-10-15 (×4): qty 1

## 2014-10-15 MED ORDER — MIDODRINE HCL 2.5 MG PO TABS
2.5000 mg | ORAL_TABLET | ORAL | Status: DC
  Administered 2014-10-17 (×2): 2.5 mg via ORAL
  Filled 2014-10-15 (×2): qty 1

## 2014-10-15 MED ORDER — LORATADINE 10 MG PO TABS
10.0000 mg | ORAL_TABLET | Freq: Every day | ORAL | Status: DC
  Administered 2014-10-16 – 2014-10-18 (×3): 10 mg via ORAL
  Filled 2014-10-15 (×3): qty 1

## 2014-10-15 MED ORDER — PANTOPRAZOLE SODIUM 40 MG PO TBEC
40.0000 mg | DELAYED_RELEASE_TABLET | Freq: Every day | ORAL | Status: DC
  Administered 2014-10-16 – 2014-10-18 (×3): 40 mg via ORAL
  Filled 2014-10-15 (×4): qty 1

## 2014-10-15 MED ORDER — ASPIRIN 81 MG PO CHEW
81.0000 mg | CHEWABLE_TABLET | Freq: Every day | ORAL | Status: DC
  Administered 2014-10-15 – 2014-10-18 (×4): 81 mg via ORAL
  Filled 2014-10-15 (×4): qty 1

## 2014-10-15 MED ORDER — ACETAMINOPHEN 325 MG PO TABS
650.0000 mg | ORAL_TABLET | Freq: Four times a day (QID) | ORAL | Status: DC | PRN
  Administered 2014-10-15 – 2014-10-18 (×2): 650 mg via ORAL
  Filled 2014-10-15 (×3): qty 2

## 2014-10-15 MED ORDER — MIDODRINE HCL 5 MG PO TABS: 5.0000 mg | ORAL_TABLET | ORAL | Status: DC

## 2014-10-15 MED ORDER — RENA-VITE PO TABS
1.0000 | ORAL_TABLET | Freq: Every day | ORAL | Status: DC
  Administered 2014-10-15 – 2014-10-17 (×3): 1 via ORAL
  Filled 2014-10-15 (×5): qty 1

## 2014-10-15 MED ORDER — ATORVASTATIN CALCIUM 40 MG PO TABS
40.0000 mg | ORAL_TABLET | Freq: Every day | ORAL | Status: DC
  Administered 2014-10-16 – 2014-10-17 (×2): 40 mg via ORAL
  Filled 2014-10-15 (×4): qty 1

## 2014-10-15 MED ORDER — CALCITRIOL 0.5 MCG PO CAPS
0.5000 ug | ORAL_CAPSULE | ORAL | Status: DC
  Filled 2014-10-15: qty 1

## 2014-10-15 MED ORDER — POLYETHYLENE GLYCOL 3350 17 G PO PACK
17.0000 g | PACK | Freq: Every day | ORAL | Status: DC | PRN
  Administered 2014-10-15: 17 g via ORAL
  Filled 2014-10-15 (×2): qty 1

## 2014-10-15 MED ORDER — HEPARIN SODIUM (PORCINE) 5000 UNIT/ML IJ SOLN: 5000.0000 [IU] | Freq: Three times a day (TID) | INTRAMUSCULAR | Status: DC

## 2014-10-15 MED ORDER — INSULIN ASPART 100 UNIT/ML ~~LOC~~ SOLN
0.0000 [IU] | Freq: Three times a day (TID) | SUBCUTANEOUS | Status: DC
  Administered 2014-10-16: 7 [IU] via SUBCUTANEOUS
  Administered 2014-10-17 (×2): 2 [IU] via SUBCUTANEOUS

## 2014-10-15 MED ORDER — NEPRO/CARBSTEADY PO LIQD: 237.0000 mL | ORAL | Status: DC | PRN

## 2014-10-15 MED ORDER — SODIUM CHLORIDE 0.9 % IJ SOLN
3.0000 mL | Freq: Two times a day (BID) | INTRAMUSCULAR | Status: DC
  Administered 2014-10-15 – 2014-10-18 (×6): 3 mL via INTRAVENOUS

## 2014-10-15 MED ORDER — ONDANSETRON HCL 4 MG/2ML IJ SOLN
4.0000 mg | Freq: Four times a day (QID) | INTRAMUSCULAR | Status: DC | PRN
  Administered 2014-10-15: 4 mg via INTRAVENOUS

## 2014-10-15 NOTE — Progress Notes (Signed)
[facility].  H/o ESRD, inoperable AS, COPD, PAF, CHF, pulmonary HTN sent to ED for acute on chronic hypoxic respiratory failure. CXR shows pleural effusion and chronic infiltrate. proBNP high.  satting ok on 3 L [place] and VSS. Code status DNR. [facility], so [facility].  CALL FLOW MANAGER ON ARRIVAL TO UNIT.  [phone] OR [phone].  FLOW MANAGER WILL PAGE HOSPITALIST TO EVALUATE AND WRITE ORDERS.  Crista Curborinna Danialle Dement, MD [facility]

## 2014-10-15 NOTE — H&P (Signed)
[facility] History and Physical  [name] [id number] DOB: 1941/03/08 DOA: 10/15/2014  Referring physician: EDP PCP: Galvin ProfferHAGUE, IMRAN P, MD   Chief Complaint: SOB   HPI: [name] is a 74 y.o. female with multiple medical problems including ESRD dialysis MWF, CHF (pulm htn, inoperable AS, on midodrine chronically).  Patient presents to the ED at [place] today after awakening from sleep this morning with SOB.  She has cough productive of clear sputum.  She states the run of dialysis on Monday was normal as far as she knows.  She did run out of some of her meds within the past week (5mg  midodrine bottle and amiodarone bottle are noted to be empty).  She isnt sure what day she ran out of them or if she had been getting them as her son manages all of her meds normally.  Review of Systems: No chest pain, no fever, Systems reviewed.  As above, otherwise negative  Past Medical History  Diagnosis Date  . CHF (congestive heart failure)     diast SHF, RV failure, pulm HTN  . Diabetes mellitus   . ESRD on hemodialysis     HD MWF in [place]   . Hyperlipidemia   . Anemia   . Hypertension   . COPD (chronic obstructive pulmonary disease)     severe with FEV1 0.64 L  . Peripheral vascular disease   . Coronary artery disease     Hx CABG. Cath 07/06/13 for CP episode showed occlusive graft disease and moderate AS; pt was not a candidate for repeat surgery and a complex PCI/rotablator/stenting procedure of calcified native RCA was done by cardiology  . PAF (paroxysmal atrial fibrillation)     recurrent  . Chronic right-sided heart failure   . Aortic stenosis    Past Surgical History  Procedure Laterality Date  . Coronary artery bypass graft  10/20/2009    x3 by Dr Maren BeachVanTrigt with LIMA to the LAD, a vein to the circumflex and a vien to the PDA.  Marland Kitchen. Abdominal hysterectomy    . Thoracentesis Left 12/17/2009  . Av fistula placement  01-12-2008    left upper arm  . Cardiac  catheterization  10/15/2009    which showed low normal LV function with mild inferior hypocontractility.  . Thoracentesis Right 10/27/2013  . Esophagogastroduodenoscopy N/A 07/23/2014    Procedure: ESOPHAGOGASTRODUODENOSCOPY (EGD);  Surgeon: Louis Meckelobert D Kaplan, MD;  [facility];  Service: Endoscopy;  Laterality: N/A;  . Left and right heart catheterization with coronary angiogram  07/06/2013    Procedure: LEFT AND RIGHT HEART CATHETERIZATION WITH CORONARY ANGIOGRAM;  Surgeon: Lennette Biharihomas A Kelly, MD;  [facility];  Service: Cardiovascular;;  . Percutaneous coronary rotoblator intervention (pci-r) Left 07/11/2013    Procedure: PERCUTANEOUS CORONARY ROTOBLATOR INTERVENTION (PCI-R);  Surgeon: Lennette Biharihomas A Kelly, MD;  [facility];  Service: Cardiovascular;  Laterality: Left;  . Left heart catheterization with coronary angiogram N/A 10/08/2013    Procedure: LEFT HEART CATHETERIZATION WITH CORONARY ANGIOGRAM;  Surgeon: Corky CraftsJayadeep S Varanasi, MD;  [facility];  Service: Cardiovascular;  Laterality: N/A;   Social History:  reports that she has quit smoking. Her smoking use included Cigarettes. She smoked 0.50 packs per day. She has never used smokeless tobacco. She reports that she does not drink alcohol or use illicit drugs.  Allergies  Allergen Reactions  . Codeine Other (See Comments)    POSSIBLY RASH?  Marland Kitchen. Sulfa Antibiotics Other (See Comments)    unknown  Family History  Problem Relation Age of Onset  . Heart disease Mother      Prior to Admission medications   Medication Sig Start Date End Date Taking? Authorizing Provider  amiodarone (PACERONE) 400 MG tablet Take 0.5 tablets (200 mg total) by mouth daily. 07/26/14   Shanker Levora Dredge, MD  aspirin 81 MG chewable tablet Chew 81 mg by mouth daily.     Historical Provider, MD  atorvastatin (LIPITOR) 40 MG tablet Take 40 mg by mouth at bedtime.    Historical Provider, MD  b complex-vitamin c-folic acid (NEPHRO-VITE)  0.8 MG TABS tablet Take 1 tablet by mouth at bedtime.    Historical Provider, MD  calcitRIOL (ROCALTROL) 0.5 MCG capsule Take 1 capsule (0.5 mcg total) by mouth every Monday, Wednesday, and Friday with hemodialysis. 07/24/14   Shanker Levora Dredge, MD  fexofenadine (ALLEGRA) 180 MG tablet Take 180 mg by mouth daily.    Historical Provider, MD  insulin aspart (NOVOLOG) 100 UNIT/ML injection Inject 2-10 Units into the skin See admin instructions. Sliding scale. 151-200 use 2 units; 201-250 use 4 units; 251-300 use 6 units; 301-350 use 8 units; 351-400 use 10 units    Historical Provider, MD  midodrine (PROAMATINE) 2.5 MG tablet Take 1 tablet (2.5 mg total) by mouth 2 (two) times daily with a meal. on Sunday, Tuesday, Thursday, Saturday 07/26/14   Maretta Bees, MD  midodrine (PROAMATINE) 5 MG tablet Take 1 tablet (5 mg total) by mouth every Monday, Wednesday, and Friday with hemodialysis. 08/29/14   Brittainy Sherlynn Carbon, PA-C  nitroGLYCERIN (NITROSTAT) 0.4 MG SL tablet Place 0.4 mg under the tongue every 5 (five) minutes as needed for chest pain.    Historical Provider, MD  Nutritional Supplements (FEEDING SUPPLEMENT, NEPRO CARB STEADY,) LIQD Take 237 mLs by mouth as needed (missed meal during dialysis.). 07/26/14   Shanker Levora Dredge, MD  pantoprazole (PROTONIX) 40 MG tablet Take 1 tablet (40 mg total) by mouth daily. 07/24/14   Shanker Levora Dredge, MD   Physical Exam: Filed Vitals:   10/15/14 2101  BP: 149/53  Pulse: 57  Temp: 98.2 F (36.8 C)  Resp: 18    BP 149/53 mmHg  Pulse 57  Temp(Src) 98.2 F (36.8 C) (Oral)  Resp 18  Ht  (1.626 m)  Wt 57.153 kg (126 lb)  BMI 21.62 kg/m2  SpO2 95%  General Appearance:    Alert, oriented, no distress, appears stated age  Head:    Normocephalic, atraumatic  Eyes:    PERRL, EOMI, sclera non-icteric        Nose:   Nares without drainage or epistaxis. Mucosa, turbinates normal  Throat:   Moist mucous membranes. Oropharynx without erythema or  exudate.  Neck:   Supple. No carotid bruits.  No thyromegaly.  No lymphadenopathy.   Back:     No CVA tenderness, no spinal tenderness  Lungs:     Rhonchi, especially in the RLL  Chest wall:    No tenderness to palpitation  Heart:    Regular rate and rhythm without murmurs, gallops, rubs  Abdomen:     Soft, non-tender, nondistended, normal bowel sounds, no organomegaly  Genitalia:    deferred  Rectal:    deferred  Extremities:   No clubbing, cyanosis or edema.  Pulses:   2+ and symmetric all extremities  Skin:   Skin color, texture, turgor normal, no rashes or lesions  Lymph nodes:   Cervical, supraclavicular, and axillary nodes normal  Neurologic:  CNII-XII intact. Normal strength, sensation and reflexes      throughout    Labs on Admission:  Basic Metabolic Panel: No results for input(s): NA, K, CL, CO2, GLUCOSE, BUN, CREATININE, CALCIUM, MG, PHOS in the last 168 hours. Liver Function Tests: No results for input(s): AST, ALT, ALKPHOS, BILITOT, PROT, ALBUMIN in the last 168 hours. No results for input(s): LIPASE, AMYLASE in the last 168 hours. No results for input(s): AMMONIA in the last 168 hours. CBC: No results for input(s): WBC, NEUTROABS, HGB, HCT, MCV, PLT in the last 168 hours. Cardiac Enzymes: No results for input(s): CKTOTAL, CKMB, CKMBINDEX, TROPONINI in the last 168 hours.  BNP (last 3 results)  Recent Labs  10/22/13 1439  PROBNP >70000.0*   CBG: No results for input(s): GLUCAP in the last 168 hours.  Radiological Exams on Admission: No results found.  EKG: Independently reviewed.  Assessment/Plan Active Problems:   Aortic stenosis, severe   Hypotension arterial   Chronic right-sided heart failure   ESRD (end stage renal disease)   Chronic gastritis with bleeding   1. Acute on chronic heart failure - the "acute on chronic R infiltrate and enlarged from baseline R pleural effusion are seen on CXR at [place].  BNP is also very elevated at 210,000.   Would be very consistent with this patients heart failure. 1. First trop in ED is negative, will go ahead and check serial trops given history of demand ischemia MIs in the recent past as well as known and extensive history of CAD (of both native vessels and CABG, see her 2015 heart cath for description of coronary anatomy). 2. More likely however is that this patients acute decompensation is due to running out of medication (midodrine bottle is empty).  If she had missed midodrine yesterday during dialysis, it is likely that they would have taken less fluid off of her due to lower than normal BPs during dialysis yesterday. 1. Will resume midodrine at prescribed dosing schedule 2. ESRD 1. Left voicemail with nephrology, plan to dialize patient tomorrow AM, not in distress right now, no emergent dialysis needs for tonight. 3. Chronic gastritis with bleeding - Oddly enough, labs from [facility] indicate an unusually high HGB of 13.1 for the patient today.  She last had suspected UGIB in November of last year 1. Continue PPI 2. SCDs for DVT ppx, although active acute bleed very unlikely right now given unusually high HGB in a dialysis patient who is normally anemic at baseline, therefore will continue her ASA 81 she takes at home 4. H/o A.Fib - currently in NSR, will resume her amiodarone, reading through some of the cardiology notes it sounds like the patient gets demand ischemia when she goes into a.fib (ie, cath note from 10/08/13)    Code Status: DNR - Signed advanced directive is in the patients chart, it is quite understandable that given the patients multiple medical problems, she really does not want to go through another cardiac arrest and resuscitation like she had in March of last year. Family Communication: No family in room Disposition Plan: Admit to inpatient   Time spent: 70 min  GARDNER, JARED M. [facility] Pager [phone]  If 7AM-7PM, please contact the day team taking care of  the patient [email or web address] Password [facility] 10/15/2014, 9:27 PM

## 2014-10-16 ENCOUNTER — Inpatient Hospital Stay (HOSPITAL_COMMUNITY): Payer: Medicare Other

## 2014-10-16 DIAGNOSIS — J9601 Acute respiratory failure with hypoxia: Secondary | ICD-10-CM

## 2014-10-16 LAB — CBC
HCT: 43.1 % (ref 36.0–46.0)
HCT: 39.5 % (ref 36.0–46.0)
Hemoglobin: 13 g/dL (ref 12.0–15.0)
Hemoglobin: 12.5 g/dL (ref 12.0–15.0)
MCH: 27.4 pg (ref 26.0–34.0)
MCH: 28.5 pg (ref 26.0–34.0)
MCHC: 30.2 g/dL (ref 30.0–36.0)
MCHC: 31.6 g/dL (ref 30.0–36.0)
MCV: 90.7 fL (ref 78.0–100.0)
MCV: 90 fL (ref 78.0–100.0)
PLATELETS: 150 10*3/uL (ref 150–400)
PLATELETS: 108 10*3/uL — AB (ref 150–400)
RBC: 4.75 MIL/uL (ref 3.87–5.11)
RBC: 4.39 MIL/uL (ref 3.87–5.11)
RDW: 17.9 % — AB (ref 11.5–15.5)
RDW: 17.6 % — ABNORMAL HIGH (ref 11.5–15.5)
WBC: 2.8 10*3/uL — AB (ref 4.0–10.5)
WBC: 3.4 10*3/uL — AB (ref 4.0–10.5)

## 2014-10-16 LAB — RENAL FUNCTION PANEL
Albumin: 3.4 g/dL — ABNORMAL LOW (ref 3.5–5.2)
Anion gap: 14 (ref 5–15)
BUN: 23 mg/dL (ref 6–23)
CHLORIDE: 95 mmol/L — AB (ref 96–112)
CO2: 24 mmol/L (ref 19–32)
Calcium: 8.8 mg/dL (ref 8.4–10.5)
Creatinine, Ser: 4.54 mg/dL — ABNORMAL HIGH (ref 0.50–1.10)
GFR calc Af Amer: 10 mL/min — ABNORMAL LOW (ref >90)
GFR, EST NON AFRICAN AMERICAN: 9 mL/min — AB (ref >90)
Glucose, Bld: 321 mg/dL — ABNORMAL HIGH (ref 70–99)
Phosphorus: 6.6 mg/dL — ABNORMAL HIGH (ref 2.3–4.6)
Potassium: 5.7 mmol/L — ABNORMAL HIGH (ref 3.5–5.1)
SODIUM: 133 mmol/L — AB (ref 135–145)

## 2014-10-16 LAB — GLUCOSE, CAPILLARY
Glucose-Capillary: 328 mg/dL — ABNORMAL HIGH (ref 70–99)
Glucose-Capillary: 116 mg/dL — ABNORMAL HIGH (ref 70–99)
Glucose-Capillary: 202 mg/dL — ABNORMAL HIGH (ref 70–99)

## 2014-10-16 LAB — BASIC METABOLIC PANEL
ANION GAP: 13 (ref 5–15)
BUN: 20 mg/dL (ref 6–23)
CALCIUM: 9.2 mg/dL (ref 8.4–10.5)
CO2: 24 mmol/L (ref 19–32)
Chloride: 97 mmol/L (ref 96–112)
Creatinine, Ser: 4.2 mg/dL — ABNORMAL HIGH (ref 0.50–1.10)
GFR calc non Af Amer: 10 mL/min — ABNORMAL LOW (ref >90)
GFR, EST AFRICAN AMERICAN: 11 mL/min — AB (ref >90)
Glucose, Bld: 397 mg/dL — ABNORMAL HIGH (ref 70–99)
Potassium: 4.9 mmol/L (ref 3.5–5.1)
SODIUM: 134 mmol/L — AB (ref 135–145)

## 2014-10-16 LAB — TROPONIN I
TROPONIN I: 0.05 ng/mL — AB (ref <0.031)
TROPONIN I: 0.04 ng/mL — AB (ref <0.031)

## 2014-10-16 LAB — MRSA PCR SCREENING: MRSA BY PCR: POSITIVE — AB

## 2014-10-16 MED ORDER — SODIUM CHLORIDE 0.9 % IV SOLN
125.0000 mg | INTRAVENOUS | Status: DC
  Administered 2014-10-16 – 2014-10-18 (×2): 125 mg via INTRAVENOUS
  Filled 2014-10-16 (×4): qty 10

## 2014-10-16 MED ORDER — MIDODRINE HCL 5 MG PO TABS
ORAL_TABLET | ORAL | Status: AC
  Filled 2014-10-16: qty 1

## 2014-10-16 MED ORDER — CALCITRIOL 0.5 MCG PO CAPS
1.0000 ug | ORAL_CAPSULE | ORAL | Status: DC
Start: 2014-10-16 — End: 2014-10-18
  Administered 2014-10-16 – 2014-10-18 (×2): 1 ug via ORAL
  Filled 2014-10-16 (×2): qty 2

## 2014-10-16 MED ORDER — NEPRO/CARBSTEADY PO LIQD: 237.0000 mL | ORAL | Status: DC | PRN

## 2014-10-16 MED ORDER — SODIUM CHLORIDE 0.9 % IV SOLN: 100.0000 mL | INTRAVENOUS | Status: DC | PRN

## 2014-10-16 MED ORDER — PENTAFLUOROPROP-TETRAFLUOROETH EX AERO: 1.0000 "application " | INHALATION_SPRAY | CUTANEOUS | Status: DC | PRN

## 2014-10-16 MED ORDER — CLOPIDOGREL BISULFATE 75 MG PO TABS
75.0000 mg | ORAL_TABLET | Freq: Every day | ORAL | Status: DC
  Administered 2014-10-16: 75 mg via ORAL
  Filled 2014-10-16 (×2): qty 1

## 2014-10-16 MED ORDER — LIDOCAINE-PRILOCAINE 2.5-2.5 % EX CREA: 1.0000 "application " | TOPICAL_CREAM | CUTANEOUS | Status: DC | PRN

## 2014-10-16 MED ORDER — ALTEPLASE 2 MG IJ SOLR: 2.0000 mg | Freq: Once | INTRAMUSCULAR | Status: DC | PRN

## 2014-10-16 MED ORDER — LIDOCAINE HCL (PF) 1 % IJ SOLN: 5.0000 mL | INTRAMUSCULAR | Status: DC | PRN

## 2014-10-16 MED ORDER — HEPARIN SODIUM (PORCINE) 1000 UNIT/ML DIALYSIS: 1000.0000 [IU] | INTRAMUSCULAR | Status: DC | PRN

## 2014-10-16 MED ORDER — MIDODRINE HCL 5 MG PO TABS
5.0000 mg | ORAL_TABLET | Freq: Once | ORAL | Status: AC
  Administered 2014-10-16: 5 mg via ORAL

## 2014-10-16 NOTE — Progress Notes (Signed)
[facility] Progress Note   Cassie Paiseggy A Feely ZOX:[id number] DOB: 1940-10-09 DOA: 10/15/2014 PCP: Galvin ProfferHAGUE, IMRAN P, MD  Brief narrative: [name] is a 74 y.o. female [facility] with a history of end-stage renal disease on dialysis Monday Wednesday Friday, unspecified congestive heart failure, CAD status post CABG, recurrent paroxysmal atrial fibrillation, COPD who became short of breath after waking up in the morning. She was found to be fluid overloaded in the ER and therefore referred to be admitted for dialysis. According to the history obtained from the admitting doctor, she had ran out of her midodrine and amiodarone.   Subjective: Patient is sleepy this morning and fails to communicate with me appropriately. She tells me today that she is not sure if she is run out of any of her medications and cannot remember what medication she even takes.  Assessment/Plan: Principal Problem:   Acute respiratory failure with hypoxia--  ESRD (end stage renal disease) - Needs to be dialyzed to help resolve this- admitting doctor has spoken already with the nephrology team who will follow-up today  Active Problems:   Aortic stenosis, severe -Medical management    Hypotension arterial -On chronic midodrine and per history, she was out of it-it has been resumed and she will receive prescriptions prior to discharge    Chronic right-sided heart failure - Management with dialysis  Paroxysmal atrial fibrillation-  coronary artery disease status post stent - On amiodarone which is being continued- it is noted that she is not on full anticoagulation but takes aspirin  -  Plavix was discontinued in 11/15- she had acute blood loss anemia determined to be secondary to GI bleed from gastritis at that time    Chronic gastritis with bleeding - Can continue Protonix  Diabetes mellitus - Continue to follow sugars on insulin sliding scale- Lantus is on hold  Code Status: DO NOT  RESUSCITATE Family Communication: Disposition Plan: Home once dry weight is achieved DVT prophylaxis: Heparin  Consultants: Nephrology  Procedures:   Antibiotics: Anti-infectives    None     Objective: Filed Weights   10/15/14 2101  Weight: 57.153 kg (126 lb)    Intake/Output Summary (Last 24 hours) at 10/16/14 1146 Last data filed at 10/16/14 0850  Gross per 24 hour  Intake    240 ml  Output      0 ml  Net    240 ml     Vitals Filed Vitals:   10/15/14 2101 10/16/14 0611 10/16/14 0940  BP: 149/53 129/49 125/54  Pulse: 57 51 52  Temp: 98.2 F (36.8 C) 98 F (36.7 C) 97.7 F (36.5 C)  TempSrc: Oral Oral Oral  Resp: 18 17 18   Height: 5\' 4"  (1.626 m)    Weight: 57.153 kg (126 lb)    SpO2: 95% 96% 97%    Exam: General: Sleepy and difficult to communicate with, No acute respiratory distress Lungs: Clear to auscultation bilaterally without wheezes or crackles Cardiovascular: Regular rate and rhythm without murmur gallop or rub normal S1 and S2 Abdomen: Nontender, nondistended, soft, bowel sounds positive, no rebound, no ascites, no appreciable mass Extremities: No significant cyanosis, clubbing, or edema bilateral lower extremities  Data Reviewed: Basic Metabolic Panel:  Recent Labs Lab 10/16/14 0331  NA 134*  K 4.9  CL 97  CO2 24  GLUCOSE 397*  BUN 20  CREATININE 4.20*  CALCIUM 9.2   Liver Function Tests: No results for input(s): AST, ALT, ALKPHOS, BILITOT, PROT, ALBUMIN in the  last 168 hours. No results for input(s): LIPASE, AMYLASE in the last 168 hours. No results for input(s): AMMONIA in the last 168 hours. CBC:  Recent Labs Lab 10/16/14 0331 10/16/14 1042  WBC 2.8* 3.4*  HGB 13.0 12.5  HCT 43.1 39.5  MCV 90.7 90.0  PLT 150 PENDING   Cardiac Enzymes:  Recent Labs Lab 10/15/14 2215 10/16/14 0331  TROPONINI 0.06* 0.05*   BNP (last 3 results) No results for input(s): BNP in the last 8760 hours.  ProBNP (last 3  results)  Recent Labs  10/22/13 1439  PROBNP >70000.0*    CBG:  Recent Labs Lab 10/15/14 2057 10/16/14 0757  GLUCAP 356* 328*    Recent Results (from the past 240 hour(s))  MRSA PCR Screening     Status: Abnormal   Collection Time: 10/15/14 11:17 PM  Result Value Ref Range Status   MRSA by PCR POSITIVE (A) NEGATIVE Final    Comment:        The GeneXpert MRSA Assay (FDA approved for NASAL specimens only), is one component of a comprehensive MRSA colonization surveillance program. It is not intended to diagnose MRSA infection nor to guide or monitor treatment for MRSA infections. RESULT CALLED TO, READ BACK BY AND VERIFIED WITH: MURPHY,N RN [phone] 10/16/14 MITCHELL,L      Studies:  Recent x-ray studies have been reviewed in detail by the Attending Physician  Scheduled Meds:  Scheduled Meds: . amiodarone  200 mg Oral Daily  . aspirin  81 mg Oral Daily  . atorvastatin  40 mg Oral QHS  . calcitRIOL  1 mcg Oral Q M,W,F-HD  . ferric gluconate (FERRLECIT/NULECIT) IV  125 mg Intravenous Q M,W,F-HD  . insulin aspart  0-9 Units Subcutaneous TID WC  . loratadine  10 mg Oral Daily  . [START ON 10/17/2014] midodrine  2.5 mg Oral 2 times per day on Sun Tue Thu Sat  . midodrine  5 mg Oral Q M,W,F-HD  . multivitamin  1 tablet Oral QHS  . pantoprazole  40 mg Oral Daily  . sodium chloride  3 mL Intravenous Q12H   Continuous Infusions:   Time spent on care of this patient: 35 minutes   Maximino Cozzolino, MD 10/16/2014, 11:46 AM  LOS: 1 day   [facility] Office  [phone] Pager - Text Page per [email or web address]  If 7PM-7AM, please contact night-coverage [email or web address]

## 2014-10-16 NOTE — Progress Notes (Signed)
[facility] Renal Consultation Note  Indication for Consultation:  Management of ESRD/hemodialysis; anemia, hypertension/volume and secondary hyperparathyroidism  HPI: [name] is a 74 y.o. female with a history of Diabetes Type 2, COPD, CAD s/p CABG 09/2009, aortic stenosis, paroxysmal atrial fibrillation, and ESRD on dialysis at the [facility] who awoke yesterday morning with worsening dyspnea and eventually came to the ED.  She denies any fever, chills, orthopnea, nausea, or vomiting, but reports nonproductive cough.  She is compliant with her dialysis and ran a full 4.5-hour treatment on Monday 2/15, but finished 1.1 L above her dry weight.  Chest x-ray at [facility] showed acute-on-chronic right infiltrate and enlarged right pleural effusion.  She will have her regularly scheduled dialysis today as an inpatient.  Past Medical History  Diagnosis Date  . CHF (congestive heart failure)     diast SHF, RV failure, pulm HTN  . Diabetes mellitus   . ESRD on hemodialysis     HD MWF in [place]   . Hyperlipidemia   . Anemia   . Hypertension   . COPD (chronic obstructive pulmonary disease)     severe with FEV1 0.64 L  . Peripheral vascular disease   . Coronary artery disease     Hx CABG. Cath 07/06/13 for CP episode showed occlusive graft disease and moderate AS; pt was not a candidate for repeat surgery and a complex PCI/rotablator/stenting procedure of calcified native RCA was done by cardiology  . PAF (paroxysmal atrial fibrillation)     recurrent  . Chronic right-sided heart failure   . Aortic stenosis    Past Surgical History  Procedure Laterality Date  . Coronary artery bypass graft  10/20/2009    x3 by Dr Darcey Nora with LIMA to the LAD, a vein to the circumflex and a vien to the PDA.  Marland Kitchen Abdominal hysterectomy    . Thoracentesis Left 12/17/2009  . Av fistula placement  01-12-2008    left upper arm  . Cardiac catheterization  10/15/2009    which  showed low normal LV function with mild inferior hypocontractility.  . Thoracentesis Right 10/27/2013  . Esophagogastroduodenoscopy N/A 07/23/2014    Procedure: ESOPHAGOGASTRODUODENOSCOPY (EGD);  Surgeon: Inda Castle, MD;  Location: [place];  Service: Endoscopy;  Laterality: N/A;  . Left and right heart catheterization with coronary angiogram  07/06/2013    Procedure: LEFT AND RIGHT HEART CATHETERIZATION WITH CORONARY ANGIOGRAM;  Surgeon: Troy Sine, MD;  Location: [facility] CATH LAB;  Service: Cardiovascular;;  . Percutaneous coronary rotoblator intervention (pci-r) Left 07/11/2013    Procedure: PERCUTANEOUS CORONARY ROTOBLATOR INTERVENTION (PCI-R);  Surgeon: Troy Sine, MD;  Location: [facility] CATH LAB;  Service: Cardiovascular;  Laterality: Left;  . Left heart catheterization with coronary angiogram N/A 10/08/2013    Procedure: LEFT HEART CATHETERIZATION WITH CORONARY ANGIOGRAM;  Surgeon: Jettie Booze, MD;  Location: [facility] CATH LAB;  Service: Cardiovascular;  Laterality: N/A;   Family History  Problem Relation Age of Onset  . Heart disease Mother    Social History She quit smoking cigarettes in 2014 after using 1/2 pack for about 40 years. She denies any alcohol or illicit drug use.  Currently she lives at home with her son.  Review of Systems Constitutional: negative for chills, fatigue, fevers and sweats Ears, nose, mouth, throat, and face: negative for earaches, hoarseness, nasal congestion and sore throat Respiratory: positive for cough and dyspnea on exertion, negative for hemoptysis and sputum Cardiovascular: positive for dyspnea, negative for chest  pain, chest pressure/discomfort, orthopnea and palpitations Gastrointestinal: negative for abdominal pain, change in bowel habits, nausea and vomiting Genitourinary:negative, anuric Musculoskeletal:negative for arthralgias, back pain, myalgias and neck pain Neurological: negative for dizziness, headaches, paresthesia, speech  problems and weakness  Physical Exam: BP 86/39 mmHg  Pulse 57  Temp(Src) 97.9 F (36.6 C) (Oral)  Resp 24  Ht 5' 4"  (1.626 m)  Wt 57.4 kg (126 lb 8.7 oz)  BMI 21.71 kg/m2  SpO2 96%  General appearance: alert, cooperative and no distress Head: Normocephalic, without obvious abnormality, atraumatic Neck: no adenopathy, no carotid bruit, no JVD and supple, symmetrical, trachea midline Resp: diminished breath sounds, bibasilar rales Cardio: regular rate and rhythm with Gr II/VI systolic murmur, no rub GI: soft, non-tender; bowel sounds normal; no masses,  no organomegaly Extremities: extremities normal, atraumatic, no cyanosis or edema Neurologic: Grossly normal Dialysis Access: AVF @ LUA with + bruit   Labs:  Results for orders placed or performed during the hospital encounter of 10/15/14 (from the past 48 hour(s))  Glucose, capillary     Status: Abnormal   Collection Time: 10/15/14  8:57 PM  Result Value Ref Range   Glucose-Capillary 356 (H) 70 - 99 mg/dL  Troponin I     Status: Abnormal   Collection Time: 10/15/14 10:15 PM  Result Value Ref Range   Troponin I 0.06 (H) <0.031 ng/mL    Comment:        PERSISTENTLY INCREASED TROPONIN VALUES IN THE RANGE OF 0.04-0.49 ng/mL CAN BE SEEN IN:       -UNSTABLE ANGINA       -CONGESTIVE HEART FAILURE       -MYOCARDITIS       -CHEST TRAUMA       -ARRYHTHMIAS       -LATE PRESENTING MYOCARDIAL INFARCTION       -COPD   CLINICAL FOLLOW-UP RECOMMENDED.   MRSA PCR Screening     Status: Abnormal   Collection Time: 10/15/14 11:17 PM  Result Value Ref Range   MRSA by PCR POSITIVE (A) NEGATIVE    Comment:        The GeneXpert MRSA Assay (FDA approved for NASAL specimens only), is one component of a comprehensive MRSA colonization surveillance program. It is not intended to diagnose MRSA infection nor to guide or monitor treatment for MRSA infections. RESULT CALLED TO, READ BACK BY AND VERIFIED WITH: MURPHY,N RN [phone] 10/16/14  MITCHELL,L   CBC     Status: Abnormal   Collection Time: 10/16/14  3:31 AM  Result Value Ref Range   WBC 2.8 (L) 4.0 - 10.5 K/uL   RBC 4.75 3.87 - 5.11 MIL/uL   Hemoglobin 13.0 12.0 - 15.0 g/dL   HCT 43.1 36.0 - 46.0 %   MCV 90.7 78.0 - 100.0 fL   MCH 27.4 26.0 - 34.0 pg   MCHC 30.2 30.0 - 36.0 g/dL   RDW 17.9 (H) 11.5 - 15.5 %   Platelets 150 150 - 400 K/uL    Comment: SPECIMEN CHECKED FOR CLOTS REPEATED TO VERIFY PLATELET COUNT CONFIRMED BY SMEAR   Basic metabolic panel     Status: Abnormal   Collection Time: 10/16/14  3:31 AM  Result Value Ref Range   Sodium 134 (L) 135 - 145 mmol/L   Potassium 4.9 3.5 - 5.1 mmol/L   Chloride 97 96 - 112 mmol/L   CO2 24 19 - 32 mmol/L   Glucose, Bld 397 (H) 70 - 99 mg/dL   BUN 20 6 -  23 mg/dL   Creatinine, Ser 4.20 (H) 0.50 - 1.10 mg/dL   Calcium 9.2 8.4 - 10.5 mg/dL   GFR calc non Af Amer 10 (L) >90 mL/min   GFR calc Af Amer 11 (L) >90 mL/min    Comment: (NOTE) The eGFR has been calculated using the CKD EPI equation. This calculation has not been validated in all clinical situations. eGFR's persistently <90 mL/min signify possible Chronic Kidney Disease.    Anion gap 13 5 - 15  Troponin I     Status: Abnormal   Collection Time: 10/16/14  3:31 AM  Result Value Ref Range   Troponin I 0.05 (H) <0.031 ng/mL    Comment:        PERSISTENTLY INCREASED TROPONIN VALUES IN THE RANGE OF 0.04-0.49 ng/mL CAN BE SEEN IN:       -UNSTABLE ANGINA       -CONGESTIVE HEART FAILURE       -MYOCARDITIS       -CHEST TRAUMA       -ARRYHTHMIAS       -LATE PRESENTING MYOCARDIAL INFARCTION       -COPD   CLINICAL FOLLOW-UP RECOMMENDED.   Glucose, capillary     Status: Abnormal   Collection Time: 10/16/14  7:57 AM  Result Value Ref Range   Glucose-Capillary 328 (H) 70 - 99 mg/dL  Troponin I     Status: Abnormal   Collection Time: 10/16/14 10:42 AM  Result Value Ref Range   Troponin I 0.04 (H) <0.031 ng/mL    Comment:        PERSISTENTLY  INCREASED TROPONIN VALUES IN THE RANGE OF 0.04-0.49 ng/mL CAN BE SEEN IN:       -UNSTABLE ANGINA       -CONGESTIVE HEART FAILURE       -MYOCARDITIS       -CHEST TRAUMA       -ARRYHTHMIAS       -LATE PRESENTING MYOCARDIAL INFARCTION       -COPD   CLINICAL FOLLOW-UP RECOMMENDED.   CBC     Status: Abnormal (Preliminary result)   Collection Time: 10/16/14 10:42 AM  Result Value Ref Range   WBC 3.4 (L) 4.0 - 10.5 K/uL    Comment: REPEATED TO VERIFY   RBC 4.39 3.87 - 5.11 MIL/uL   Hemoglobin 12.5 12.0 - 15.0 g/dL   HCT 39.5 36.0 - 46.0 %   MCV 90.0 78.0 - 100.0 fL   MCH 28.5 26.0 - 34.0 pg   MCHC 31.6 30.0 - 36.0 g/dL   RDW 17.6 (H) 11.5 - 15.5 %   Platelets PENDING 150 - 400 K/uL  Renal function panel     Status: Abnormal   Collection Time: 10/16/14 10:42 AM  Result Value Ref Range   Sodium 133 (L) 135 - 145 mmol/L   Potassium 5.7 (H) 3.5 - 5.1 mmol/L   Chloride 95 (L) 96 - 112 mmol/L   CO2 24 19 - 32 mmol/L   Glucose, Bld 321 (H) 70 - 99 mg/dL   BUN 23 6 - 23 mg/dL   Creatinine, Ser 4.54 (H) 0.50 - 1.10 mg/dL   Calcium 8.8 8.4 - 10.5 mg/dL   Phosphorus 6.6 (H) 2.3 - 4.6 mg/dL   Albumin 3.4 (L) 3.5 - 5.2 g/dL   GFR calc non Af Amer 9 (L) >90 mL/min   GFR calc Af Amer 10 (L) >90 mL/min    Comment: (NOTE) The eGFR has been calculated using the CKD EPI equation. This  calculation has not been validated in all clinical situations. eGFR's persistently <90 mL/min signify possible Chronic Kidney Disease.    Anion gap 14 5 - 15     Inpatient Medications: . amiodarone  200 mg Oral Daily  . aspirin  81 mg Oral Daily  . atorvastatin  40 mg Oral QHS  . calcitRIOL  1 mcg Oral Q M,W,F-HD  . clopidogrel  75 mg Oral Daily  . ferric gluconate (FERRLECIT/NULECIT) IV  125 mg Intravenous Q M,W,F-HD  . insulin aspart  0-9 Units Subcutaneous TID WC  . loratadine  10 mg Oral Daily  . midodrine      . [START ON 10/17/2014] midodrine  2.5 mg Oral 2 times per day on Sun Tue Thu Sat  .  midodrine  5 mg Oral Q M,W,F-HD  . multivitamin  1 tablet Oral QHS  . pantoprazole  40 mg Oral Daily  . sodium chloride  3 mL Intravenous Q12H   Dialysis Orders:  MWF @ [facility] 4:30     54.5 kg    3K/2.25Ca     350/A1.5      No Heparin     AVF @ LUA Calcitriol 1 mcg       No Mircera         Venofer 100 mg x 10 (through 2/22)  Assessment/Plan: 1. Dyspnea - CXR shows acute-on-chronic R infiltrate & increased R pleural effusion, afebrile, left HD yesterday 1.1 L over EDW.  2. ESRD - HD on MWF @ [facility], K 5.7 pre-HD.  HD pending. Obtain post HD CXR. If large effusion might benefit from thoracentesis as dialysis not a very good option for removal of this third spaced fluid 3. Hypertension/volume - BP 112/54 on Midodrine pre-HD; wt 57.4 kg, goal of 3-4 L.  4. Anemia - Hgb 12.5, on IV Fe x 10 for T-sat 17%. 5. Metabolic bone disease - Ca 8.8 (9.3 corrected), P 6.6, iPTH 630; Calcitriol 1 mcg, no binders.  Start binder. 6. Nutrition - Alb 3.4, renal carb-mod diet, vitamin. 7. DM insulin per primary. 8. CAD - s/p CABG in 09/2009. 9. PAF - on Amiodarone. 10. Aortic stenosis - severe, inoperable.  LYLES,CHARLES 10/16/2014, 12:17 PM   Attending Nephrologist:  Jamal Maes, MD  I have seen and examined this patient and agree with plan as outlined in the above note with highlighted addition.  Pt walks fine line in terms of BP/volume, with chronically low BP's, severe inoperable AS.  Reportedly large effusion on xray from [place] - dialysis not a great modality for removal pleural fluid - a chest xray post HD will be done to evaluate pleural effusion - would consider thoracentesis if effusion in fact large as billed from [facility]. Carlyn Mullenbach B,MD 10/16/2014 2:17 PM

## 2014-10-16 NOTE — Progress Notes (Signed)
Inpatient Diabetes Program Recommendations  AACE/ADA: New Consensus Statement on Inpatient Glycemic Control (2013)  Target Ranges:  Prepandial:   less than 140 mg/dL      Peak postprandial:   less than 180 mg/dL (1-2 hours)      Critically ill patients:  140 - 180 mg/dL   Results for [name], [name] (MRN [id number]) as of 10/16/2014 13:26  Ref. Range 10/15/2014 20:57 10/16/2014 07:57  Glucose-Capillary Latest Range: 70-99 mg/dL 409356 (H) 811328 (H)    Diabetes history: DM 2 Outpatient Diabetes medications: Novolog 2-10 units TID, Lantus 10 units PRN Current orders for Inpatient glycemic control: Novolog 0-9 units TID  Inpatient Diabetes Program Recommendations Insulin - Basal: Patient's glucose is consistently in the 300 mg/dl range. Please consider ordering Levemir 10 units Q24hrs. Correction (SSI): Please consider placing patient on Novolog 0-5 units QHS for bedtime coverage.  Thanks, Christena DeemShannon Quindell Shere RN, MSN, [facility] Inpatient Diabetes Coordinator Team Pager [phone]

## 2014-10-16 NOTE — Clinical Documentation Improvement (Signed)
Please specify diagnosis related to below supporting information if appropriate.   Possible Clinical Conditions?  Chronic Systolic Congestive Heart Failure Chronic Diastolic Congestive Heart Failure Chronic Systolic & Diastolic Congestive Heart Failure Acute Systolic Congestive Heart Failure Acute Diastolic Congestive Heart Failure Acute Systolic & Diastolic Congestive Heart Failure Acute on Chronic Systolic Congestive Heart Failure Acute on Chronic Diastolic Congestive Heart Failure Acute on Chronic Systolic & Diastolic Congestive Heart Failure Other Condition________________________________________ Cannot Clinically Determine  Supporting Information:  Per 10/15/14 H&P = Acute on chronic heart failure - the "acute on chronic R infiltrate and enlarged from baseline R pleural effusion are seen on CXR at [place]. BNP is also very elevated at 210,000. Would be very consistent with this patients heart failure.   Chronic right-sided heart failure.    Per 10/15/14 MD progress note= H/o CHF.    Thank You, Shelda Palarlene H Fantashia Shupert ,RN Clinical Documentation Specialist:  [phone]  [facility]- Health Information Management

## 2014-10-16 NOTE — Procedures (Signed)
I have personally attended this patient's dialysis session. Despite midodrine 5 mg, BP fairly early into treatment in the 80's which will make fluid removal impossible. Will increase midodrine to 10, UF as tolerated 2K bath K of 5.7    Taina Landry B [facility] [phone] Pager 10/16/2014, 2:34 PM

## 2014-10-17 ENCOUNTER — Inpatient Hospital Stay (HOSPITAL_COMMUNITY): Payer: Medicare Other

## 2014-10-17 LAB — RENAL FUNCTION PANEL
Albumin: 3.3 g/dL — ABNORMAL LOW (ref 3.5–5.2)
Anion gap: 11 (ref 5–15)
BUN: 14 mg/dL (ref 6–23)
CO2: 28 mmol/L (ref 19–32)
CREATININE: 2.78 mg/dL — AB (ref 0.50–1.10)
Calcium: 9.1 mg/dL (ref 8.4–10.5)
Chloride: 96 mmol/L (ref 96–112)
GFR calc non Af Amer: 16 mL/min — ABNORMAL LOW (ref >90)
GFR, EST AFRICAN AMERICAN: 19 mL/min — AB (ref >90)
Glucose, Bld: 147 mg/dL — ABNORMAL HIGH (ref 70–99)
PHOSPHORUS: 4.7 mg/dL — AB (ref 2.3–4.6)
Potassium: 4 mmol/L (ref 3.5–5.1)
SODIUM: 135 mmol/L (ref 135–145)

## 2014-10-17 LAB — CBC
HCT: 40.3 % (ref 36.0–46.0)
Hemoglobin: 12.7 g/dL (ref 12.0–15.0)
MCH: 28.4 pg (ref 26.0–34.0)
MCHC: 31.5 g/dL (ref 30.0–36.0)
MCV: 90.2 fL (ref 78.0–100.0)
PLATELETS: 112 10*3/uL — AB (ref 150–400)
RBC: 4.47 MIL/uL (ref 3.87–5.11)
RDW: 17.9 % — AB (ref 11.5–15.5)
WBC: 5.3 10*3/uL (ref 4.0–10.5)

## 2014-10-17 LAB — LACTATE DEHYDROGENASE, PLEURAL OR PERITONEAL FLUID: LD, Fluid: 82 U/L — ABNORMAL HIGH (ref 3–23)

## 2014-10-17 LAB — BODY FLUID CELL COUNT WITH DIFFERENTIAL
EOS FL: 0 %
Lymphs, Fluid: 3 %
Monocyte-Macrophage-Serous Fluid: 94 % — ABNORMAL HIGH (ref 50–90)
Neutrophil Count, Fluid: 3 % (ref 0–25)
Total Nucleated Cell Count, Fluid: 165 cu mm (ref 0–1000)

## 2014-10-17 LAB — PROTEIN, BODY FLUID: TOTAL PROTEIN, FLUID: 3 g/dL

## 2014-10-17 LAB — GLUCOSE, CAPILLARY
GLUCOSE-CAPILLARY: 84 mg/dL (ref 70–99)
GLUCOSE-CAPILLARY: 186 mg/dL — AB (ref 70–99)
Glucose-Capillary: 157 mg/dL — ABNORMAL HIGH (ref 70–99)
Glucose-Capillary: 178 mg/dL — ABNORMAL HIGH (ref 70–99)

## 2014-10-17 MED ORDER — MUPIROCIN 2 % EX OINT
1.0000 "application " | TOPICAL_OINTMENT | Freq: Two times a day (BID) | CUTANEOUS | Status: DC
  Administered 2014-10-17 – 2014-10-18 (×3): 1 via NASAL
  Filled 2014-10-17 (×2): qty 22

## 2014-10-17 MED ORDER — INSULIN DETEMIR 100 UNIT/ML ~~LOC~~ SOLN
10.0000 [IU] | Freq: Every day | SUBCUTANEOUS | Status: DC
  Administered 2014-10-18: 10 [IU] via SUBCUTANEOUS
  Filled 2014-10-17 (×2): qty 0.1

## 2014-10-17 MED ORDER — LIDOCAINE HCL (PF) 1 % IJ SOLN
INTRAMUSCULAR | Status: AC
  Filled 2014-10-17: qty 10

## 2014-10-17 MED ORDER — CHLORHEXIDINE GLUCONATE CLOTH 2 % EX PADS
6.0000 | MEDICATED_PAD | Freq: Every day | CUTANEOUS | Status: DC
  Administered 2014-10-17 – 2014-10-18 (×2): 6 via TOPICAL

## 2014-10-17 NOTE — Progress Notes (Signed)
[facility] Progress Note   Cassie Paiseggy A Grantz XBJ:[id number] DOB: November 30, 1940 DOA: 10/15/2014 PCP: Galvin ProfferHAGUE, IMRAN P, MD  Brief narrative: [name] is a 74 y.o. female [facility] with a history of end-stage renal disease on dialysis Monday Wednesday Friday, unspecified congestive heart failure, CAD status post CABG, recurrent paroxysmal atrial fibrillation, COPD who became short of breath after waking up in the morning. She was found to be fluid overloaded in the ER and therefore referred to be admitted for dialysis. According to the history obtained from the admitting doctor, she had ran out of her midodrine and amiodarone.   Subjective: Patient is sleepy this morning and fails to communicate with me appropriately. She tells me today that she is not sure if she is run out of any of her medications and cannot remember what medication she even takes.  Assessment/Plan: Principal Problem:   Acute respiratory failure with hypoxia--  ESRD (end stage renal disease), recurrent right-sided pleural effusion CXR yesterday showed increased large R effusion & new small L effusion, afebrile, breathing improved after HD yesterday. Agree that patient would benefit from thoracentesis Requested interventional radiology to do ultrasound-guided thoracentesis Following cell count, culture data, cytology   Aortic stenosis, severe -Medical management  Hypotension She takes midodrine pre-hemodialysis    Chronic right-sided heart failure/recurrent right-sided pleural effusion Continue hemodialysis Scheduled the patient for right thoracentesis by interventional radiology 750 cc blood tinged fluid Repeat chest x-ray pending  Paroxysmal atrial fibrillation-  coronary artery disease status post stent - On amiodarone which is being continued- it is noted that she is not on full anticoagulation but takes aspirin  -  Plavix was discontinued in 11/15- she had acute blood loss anemia  determined to be secondary to GI bleed from gastritis at that time    Chronic gastritis with bleeding - Can continue Protonix  Diabetes mellitus CBG greater than 300, start the patient on Levemir 10 units, NovoLog SSI   Code Status: DO NOT RESUSCITATE Family Communication: Disposition Plan: Home once dry weight is achieved DVT prophylaxis: Heparin  Consultants: Nephrology Interventional radiology  Procedures:   Antibiotics: Anti-infectives    None     [facility]   10/15/14 2101 10/16/14 1200 10/16/14 1652  Weight: 57.153 kg (126 lb) 57.4 kg (126 lb 8.7 oz) 53.8 kg (118 lb 9.7 oz)    Intake/Output Summary (Last 24 hours) at 10/17/14 1206 Last data filed at 10/16/14 1854  Gross per 24 hour  Intake    240 ml  Output   3501 ml  Net  -3261 ml     Vitals Filed Vitals:   10/16/14 1853 10/16/14 2056 10/17/14 0556 10/17/14 0817  BP: 104/55 115/37 124/49 114/36  Pulse: 66 58 49 52  Temp: 98.1 F (36.7 C) 99.1 F (37.3 C) 98.6 F (37 C) 98.1 F (36.7 C)  TempSrc: Oral Oral Oral Oral  Resp: 20 20 18 17   Height:      Weight:      SpO2: 98% 97% 100% 95%    Exam: General: Sleepy and difficult to communicate with, No acute respiratory distress Lungs: Clear to auscultation bilaterally without wheezes or crackles Cardiovascular: Regular rate and rhythm without murmur gallop or rub normal S1 and S2 Abdomen: Nontender, nondistended, soft, bowel sounds positive, no rebound, no ascites, no appreciable mass Extremities: No significant cyanosis, clubbing, or edema bilateral lower extremities  Data Reviewed: Basic Metabolic Panel:  Recent Labs Lab 10/16/14 0331 10/16/14 1042 10/17/14 0740  NA 134* 133* 135  K 4.9 5.7* 4.0  CL 97 95* 96  CO2 GLUCOSE 397* 321* 147*  BUN CREATININE 4.20* 4.54* 2.78*  CALCIUM 9.2 8.8 9.1  PHOS  --  6.6* 4.7*   Liver Function Tests:  Recent Labs Lab 10/16/14 1042 10/17/14 0740  ALBUMIN 3.4*  3.3*   No results for input(s): LIPASE, AMYLASE in the last 168 hours. No results for input(s): AMMONIA in the last 168 hours. CBC:  Recent Labs Lab 10/16/14 0331 10/16/14 1042 10/17/14 0740  WBC 2.8* 3.4* 5.3  HGB 13.0 12.5 12.7  HCT 43.1 39.5 40.3  MCV 90.7 90.0 90.2  PLT 150 108* 112*   Cardiac Enzymes:  Recent Labs Lab 10/15/14 2215 10/16/14 0331 10/16/14 1042  TROPONINI 0.06* 0.05* 0.04*   BNP (last 3 results) No results for input(s): BNP in the last 8760 hours.  ProBNP (last 3 results)  Recent Labs  10/22/13 1439  PROBNP >70000.0*    CBG:  Recent Labs Lab 10/15/14 2057 10/16/14 0757 10/16/14 1723 10/16/14 2136 10/17/14 0809  GLUCAP 356* 328* 116* 202* 157*    Recent Results (from the past 240 hour(s))  MRSA PCR Screening     Status: Abnormal   Collection Time: 10/15/14 11:17 PM  Result Value Ref Range Status   MRSA by PCR POSITIVE (A) NEGATIVE Final    Comment:        The GeneXpert MRSA Assay (FDA approved for NASAL specimens only), is one component of a comprehensive MRSA colonization surveillance program. It is not intended to diagnose MRSA infection nor to guide or monitor treatment for MRSA infections. RESULT CALLED TO, READ BACK BY AND VERIFIED WITH: MURPHY,N RN [phone] 10/16/14 MITCHELL,L      Studies:  Recent x-ray studies have been reviewed in detail by the Attending Physician  Scheduled Meds:  Scheduled Meds: . amiodarone  200 mg Oral Daily  . aspirin  81 mg Oral Daily  . atorvastatin  40 mg Oral QHS  . calcitRIOL  1 mcg Oral Q M,W,F-HD  . Chlorhexidine Gluconate Cloth  6 each Topical Q0600  . clopidogrel  75 mg Oral Daily  . ferric gluconate (FERRLECIT/NULECIT) IV  125 mg Intravenous Q M,W,F-HD  . insulin aspart  0-9 Units Subcutaneous TID WC  . lidocaine (PF)      . loratadine  10 mg Oral Daily  . midodrine  2.5 mg Oral 2 times per day on Sun Tue Thu Sat  . midodrine  5 mg Oral Q M,W,F-HD  . multivitamin  1 tablet  Oral QHS  . mupirocin ointment  1 application Nasal BID  . pantoprazole  40 mg Oral Daily  . sodium chloride  3 mL Intravenous Q12H   Continuous Infusions:   Time spent on care of this patient: 35 minutes   Jacon Whetzel, MD 10/17/2014, 12:06 PM  LOS: 2 days   [facility] Pager  [phone] Pager - Text Page per [email or web address]  If 7PM-7AM, please contact night-coverage [email or web address]

## 2014-10-17 NOTE — Procedures (Signed)
Rt thora--US guided  750 cc blood tinged fluid cxr pending  Sent for labs per MD

## 2014-10-17 NOTE — Progress Notes (Signed)
[facility] Rounding Note Subjective:   Breathing much better after dialysis yesterday, wants to go home CXR after dialysis shows very large right pleural effusion  Objective: Vital signs in last 24 hours: Temp:  [97.7 F (36.5 C)-99.1 F (37.3 C)] 98.6 F (37 C) (02/18 0556) Pulse Rate:  [49-66] 49 (02/18 0556) Resp:  [18-24] 18 (02/18 0556) BP: (86-125)/(37-55) 124/49 mmHg (02/18 0556) SpO2:  [96 %-100 %] 100 % (02/18 0556) Weight:  [53.8 kg (118 lb 9.7 oz)-57.4 kg (126 lb 8.7 oz)] 53.8 kg (118 lb 9.7 oz) (02/17 1652) Weight change: 0.247 kg (8.7 oz)  Intake/Output from previous day: 02/17 0701 - 02/18 0700 In: 480 [P.O.:480] Out: 3501 [Stool:1] Intake/Output this shift:    EXAM: General appearance: Alert, in no apparent distress Resp:  Decreased breath sounds, but mostly clear Cardio:  RRR with Gr II/VI systolic murmur, no rub GI:  + BS, soft and nontender Extremities:  No edema Access:  AVF @ LUA with + bruit  Lab Results:  Recent Labs  10/16/14 0331 10/16/14 1042  WBC 2.8* 3.4*  HGB 13.0 12.5  HCT 43.1 39.5  PLT 150 108*   BMET:  Recent Labs  10/16/14 0331 10/16/14 1042  NA 134* 133*  K 4.9 5.7*  CL 97 95*  CO2 24 24  GLUCOSE 397* 321*  BUN 20 23  CREATININE 4.20* 4.54*  CALCIUM 9.2 8.8  ALBUMIN  --  3.4*   No results for input(s): PTH in the last 72 hours. Iron Studies: No results for input(s): IRON, TIBC, TRANSFERRIN, FERRITIN in the last 72 hours.  Studies/Results: Dg Chest 2 View  10/16/2014   CLINICAL DATA:  Pleural effusion.  EXAM: CHEST  2 VIEW  COMPARISON:  10/17/2014 and 08/14/2014 and 05/06/2014  FINDINGS: The large right pleural effusion has increased. New small left effusion. Heart size and vascularity are normal. CABG. No acute osseous abnormality.  IMPRESSION: Further increase in the large right pleural effusion. New small left effusion.   Electronically Signed   By: Francene BoyersJames  Maxwell M.D.   On: 10/16/2014 19:47    Inpatient Medications: . amiodarone  200 mg Oral Daily  . aspirin  81 mg Oral Daily  . atorvastatin  40 mg Oral QHS  . calcitRIOL  1 mcg Oral Q M,W,F-HD  . clopidogrel  75 mg Oral Daily  . ferric gluconate (FERRLECIT/NULECIT) IV  125 mg Intravenous Q M,W,F-HD  . insulin aspart  0-9 Units Subcutaneous TID WC  . loratadine  10 mg Oral Daily  . midodrine  2.5 mg Oral 2 times per day on Sun Tue Thu Sat  . midodrine  5 mg Oral Q M,W,F-HD  . multivitamin  1 tablet Oral QHS  . pantoprazole  40 mg Oral Daily  . sodium chloride  3 mL Intravenous Q12H      Dialysis Orders: MWF @ [facility] 4:30 54.5 kg 3K/2.25Ca 350/A1.5 No Heparin AVF @ LUA Calcitriol 1 mcg No Mircera Venofer 100 mg x 10 (through 2/22)     Assessment/Plan: 1. Dyspnea - CXR yesterday showed increased large R effusion & new small L effusion, afebrile, breathing improved after HD yesterday. Would benefit from thoracentesis as dialysis not effective way to remove pleural fluid. 2. ESRD - HD on MWF @ [facility], K 5.7 pre-HD yesterday. HD tomorrow. 3. HTN/volume - BP 124/49 on Midodrine pre-HD; wt 53.8 kg s/p net UF 3.5 L yesterday.  4. Anemia - Hgb 12.5, on IV Fe x 10 for T-sat 17%. 5.  Metabolic bone disease - Ca 8.8 (9.3 corrected), P 6.6, iPTH 630; Calcitriol 1 mcg, no binders. Start binder. 6. Nutrition - Alb 3.4, renal carb-mod diet, vitamin. 7. DM insulin per primary. 8. CAD - s/p CABG in 09/2009. 9. PAF - on Amiodarone. 10. Aortic stenosis - severe, inoperable.    LOS: 2 days   LYLES,CHARLES 10/17/2014,7:56 AM  I have seen and examined this patient and agree with plan as outlined in the above note with highlighted additions.Pt would benefit from thoracentesis for her right pleural effusion. Dialysis not effective way to remove.  HD tomorrow.  Daemion Mcniel B,MD 10/17/2014 10:53 AM

## 2014-10-18 DIAGNOSIS — R0602 Shortness of breath: Secondary | ICD-10-CM | POA: Diagnosis present

## 2014-10-18 LAB — CBC
HCT: 39.6 % (ref 36.0–46.0)
HEMOGLOBIN: 12.1 g/dL (ref 12.0–15.0)
MCH: 27.2 pg (ref 26.0–34.0)
MCHC: 30.6 g/dL (ref 30.0–36.0)
MCV: 89 fL (ref 78.0–100.0)
Platelets: 100 10*3/uL — ABNORMAL LOW (ref 150–400)
RBC: 4.45 MIL/uL (ref 3.87–5.11)
RDW: 17.6 % — ABNORMAL HIGH (ref 11.5–15.5)
WBC: 5.5 10*3/uL (ref 4.0–10.5)

## 2014-10-18 LAB — RENAL FUNCTION PANEL
Albumin: 3.2 g/dL — ABNORMAL LOW (ref 3.5–5.2)
Anion gap: 12 (ref 5–15)
BUN: 28 mg/dL — ABNORMAL HIGH (ref 6–23)
CHLORIDE: 97 mmol/L (ref 96–112)
CO2: 25 mmol/L (ref 19–32)
CREATININE: 4.46 mg/dL — AB (ref 0.50–1.10)
Calcium: 8.5 mg/dL (ref 8.4–10.5)
GFR calc Af Amer: 11 mL/min — ABNORMAL LOW (ref >90)
GFR, EST NON AFRICAN AMERICAN: 9 mL/min — AB (ref >90)
GLUCOSE: 230 mg/dL — AB (ref 70–99)
Phosphorus: 5.3 mg/dL — ABNORMAL HIGH (ref 2.3–4.6)
Potassium: 4.5 mmol/L (ref 3.5–5.1)
SODIUM: 134 mmol/L — AB (ref 135–145)

## 2014-10-18 LAB — PH, BODY FLUID: PH, FLUID: 8

## 2014-10-18 LAB — LACTATE DEHYDROGENASE: LDH: 171 U/L (ref 94–250)

## 2014-10-18 LAB — AMYLASE, PLEURAL FLUID: Amylase, Pleural Fluid: 17 U/L

## 2014-10-18 MED ORDER — MIDODRINE HCL 5 MG PO TABS: 5.0000 mg | ORAL_TABLET | ORAL | Status: AC

## 2014-10-18 MED ORDER — AMIODARONE HCL 200 MG PO TABS: 200.0000 mg | ORAL_TABLET | Freq: Every day | ORAL | Status: AC

## 2014-10-18 MED ORDER — CALCIUM CARBONATE ANTACID 500 MG PO CHEW: 1.0000 | CHEWABLE_TABLET | Freq: Three times a day (TID) | ORAL | Status: DC

## 2014-10-18 MED ORDER — MIDODRINE HCL 5 MG PO TABS
ORAL_TABLET | ORAL | Status: AC
  Filled 2014-10-18: qty 1

## 2014-10-18 NOTE — Progress Notes (Signed)
CARE MANAGEMENT NOTE 10/18/2014  Patient:  [name],[name]   Account Number:  [id number]  Date Initiated:  10/18/2014  Documentation [facility]:  [facility]  Subjective/Objective Assessment:   ESRD, fluid overload     Action/Plan:   lives at home with son, Everlean AlstromMaurice [phone]   Anticipated DC Date:  10/18/2014   Anticipated DC Plan:  HOME W HOME HEALTH SERVICES      DC Planning Services  CM consult      [facility]   Choice offered to / List presented to:  C-1 Patient        HH arranged  HH-2 PT  HH-1 RN      [facility]   Status of service:   Medicare Important Message given?  YES (If response is "NO", the following Medicare IM given date fields will be blank) Date Medicare IM given:  10/18/2014 Medicare IM given by:  [facility]Sue[facility] Cullen Date Additional Medicare IM given:   Additional Medicare IM given by:    Discharge Disposition:  HOME W HOME HEALTH SERVICES  Per UR Regulation:    If discussed at Long Length of Stay Meetings, dates discussed:    Comments:  10/18/2014 1700 NCM spoke to pt and [facility] Contacted son, Everlean AlstromMaurice and he will bring portable tank. She has RW and 3n1 at home. Isidoro DonningAlesia Afua Hoots RN CCM Case Mgmt phone [phone]

## 2014-10-18 NOTE — Progress Notes (Signed)
OT Cancellation Note  Patient Details Name: Hadassah Paiseggy A Folden MRN: [id number] DOB: July 15, 1941   Cancelled Treatment:    Reason Eval/Treat Not Completed: Patient at procedure or test/ unavailable. Pt off floor at HD. OT will follow up as available to complete evaluation.   Nena JordanMiller, Donell Sliwinski M 10/18/2014, 2:09 PM   Carney LivingLeeAnn Marie Lejend Dalby, OTR/L Occupational Therapist [phone] (pager)

## 2014-10-18 NOTE — Discharge Summary (Addendum)
Physician Discharge Summary  [name] MRN: [id number] DOB/AGE: 1941-04-06 74 y.o.  PCP: Bonnita Nasuti, MD   Admit date: 10/15/2014 Discharge date: 10/18/2014  Discharge Diagnoses:      Acute respiratory failure with hypoxia Pleural effusion in setting of RV failure and pulmonary hypertension   Aortic stenosis, severe   Hypotension arterial   Chronic right-sided heart failure   ESRD (end stage renal disease)   Chronic gastritis with bleeding   SOB (shortness of breath)    Follow-up recommendations Follow-up with PCP in 3 days Follow-up CBC, BMP in one week PCP to follow pleural fluid culture Pre weight 54.8 (EDW 54.5)     Medication List    TAKE these medications        amiodarone 200 MG tablet  Commonly known as:  PACERONE  Take 1 tablet (200 mg total) by mouth daily.     amLODipine 2.5 MG tablet  Commonly known as:  NORVASC  Take 2.5 mg by mouth daily.     ammonium lactate 12 % lotion  Commonly known as:  LAC-HYDRIN  Apply 1 application topically as needed for dry skin.     aspirin 81 MG chewable tablet  Chew 81 mg by mouth daily.     atorvastatin 40 MG tablet  Commonly known as:  LIPITOR  Take 40 mg by mouth at bedtime.     b complex-vitamin c-folic acid 0.8 MG Tabs tablet  Take 1 tablet by mouth at bedtime.     calcitRIOL 0.5 MCG capsule  Commonly known as:  ROCALTROL  Take 1 capsule (0.5 mcg total) by mouth every Monday, Wednesday, and Friday with hemodialysis.     feeding supplement (NEPRO CARB STEADY) Liqd  Take 237 mLs by mouth as needed (missed meal during dialysis.).     ferrous sulfate 325 (65 FE) MG tablet  Take 325 mg by mouth 2 (two) times daily with a meal.     fexofenadine 180 MG tablet  Commonly known as:  ALLEGRA  Take 180 mg by mouth daily.     insulin aspart 100 UNIT/ML injection  Commonly known as:  novoLOG  - Inject 2-10 Units into the skin See admin instructions. Sliding scale.  - 151-200 use 2 units; 201-250  use 4 units; 251-300 use 6 units; 301-350 use 8 units; 351-400 use 10 units     insulin glargine 100 UNIT/ML injection  Commonly known as:  LANTUS  Inject 10 Units into the skin at bedtime.     midodrine 5 MG tablet  Commonly known as:  PROAMATINE  Take 1 tablet (5 mg total) by mouth every Monday, Wednesday, and Friday with hemodialysis.     nitroGLYCERIN 0.4 MG SL tablet  Commonly known as:  NITROSTAT  Place 0.4 mg under the tongue every 5 (five) minutes as needed for chest pain.     omeprazole 20 MG capsule  Commonly known as:  PRILOSEC  Take 40 mg by mouth daily.     Vitamin D (Ergocalciferol) 50000 UNITS Caps capsule  Commonly known as:  DRISDOL  Take 50,000 Units by mouth every 7 (seven) days.        Discharge Condition: Stable Disposition:    Consults:  Nephrology  Significant Diagnostic Studies: Dg Chest 1 View  10/17/2014   CLINICAL DATA:  Post thoracentesis.  Spitting out blood.  EXAM: CHEST  1 VIEW  COMPARISON:  10/06/2014  FINDINGS: Decrease in right pleural effusion, now small volume. There has been uncovering  of atelectasis but no definitive re-expansion pulmonary edema. There is an interface over the peripheral right chest which is likely skin fold. No convincing pneumothorax. The left lung remains relatively clear.  Stable cardiomegaly post CABG. Aortic and hilar contours are unchanged.  IMPRESSION: Decreased right pleural effusion post thoracentesis. No pneumothorax.   Electronically Signed   By: Monte Fantasia M.D.   On: 10/17/2014 13:14   Dg Chest 2 View  10/16/2014   CLINICAL DATA:  Pleural effusion.  EXAM: CHEST  2 VIEW  COMPARISON:  10/17/2014 and 08/14/2014 and 05/06/2014  FINDINGS: The large right pleural effusion has increased. New small left effusion. Heart size and vascularity are normal. CABG. No acute osseous abnormality.  IMPRESSION: Further increase in the large right pleural effusion. New small left effusion.   Electronically Signed   By: Lorriane Shire M.D.   On: 10/16/2014 19:47   US Thoracentesis Asp Pleural Space W/img Guide  10/17/2014   INDICATION: Symptomatic Right sided pleural effusion  EXAM: US THORACENTESIS ASP PLEURAL SPACE W/IMG GUIDE  COMPARISON:  None.  MEDICATIONS: 10 cc 1% lidocaine  COMPLICATIONS: None immediate  TECHNIQUE: Informed written consent was obtained from the patient after a discussion of the risks, benefits and alternatives to treatment. A timeout was performed prior to the initiation of the procedure.  Initial ultrasound scanning demonstrates a right pleural effusion. The lower chest was prepped and draped in the usual sterile fashion. 1% lidocaine was used for local anesthesia.  Under direct ultrasound guidance, a 19 gauge, 7-cm, Yueh catheter was introduced. An ultrasound image was saved for documentation purposes. the thoracentesis was performed. The catheter was removed and a dressing was applied. The patient tolerated the procedure well without immediate post procedural complication. The patient was escorted to have an upright chest radiograph.  FINDINGS: A total of approximately 750 cc of blood tinged fluid was removed. Requested samples were sent to the laboratory.  IMPRESSION: Successful ultrasound-guided R sided thoracentesis yielding 750 cc liters of pleural fluid.  Read by:  Lavonia Drafts [facility]   Electronically Signed   By: Corrie Mckusick D.O.   On: 10/17/2014 12:28      Microbiology: Recent Results (from the past 240 hour(s))  MRSA PCR Screening     Status: Abnormal   Collection Time: 10/15/14 11:17 PM  Result Value Ref Range Status   MRSA by PCR POSITIVE (A) NEGATIVE Final    Comment:        The GeneXpert MRSA Assay (FDA approved for NASAL specimens only), is one component of a comprehensive MRSA colonization surveillance program. It is not intended to diagnose MRSA infection nor to guide or monitor treatment for MRSA infections. RESULT CALLED TO, READ BACK BY AND VERIFIED WITH: MURPHY,N RN  0141 10/16/14 MITCHELL,L   Body fluid culture     Status: None (Preliminary result)   Collection Time: 10/17/14 12:16 PM  Result Value Ref Range Status   Specimen Description FLUID RIGHT PLEURAL  Final   Special Requests FLUID RIGHT PLEURAL  Final   Gram Stain   Final    NO WBC SEEN NO ORGANISMS SEEN Performed at [facility]    Culture   Final    NO GROWTH 1 DAY Performed at [facility]    Report Status PENDING  Incomplete     Labs: Results for orders placed or performed during the hospital encounter of 10/15/14 (from the past 48 hour(s))  Glucose, capillary     Status: Abnormal  Collection Time: 10/16/14  5:23 PM  Result Value Ref Range   Glucose-Capillary 116 (H) 70 - 99 mg/dL  Glucose, capillary     Status: Abnormal   Collection Time: 10/16/14  9:36 PM  Result Value Ref Range   Glucose-Capillary 202 (H) 70 - 99 mg/dL  Renal function panel     Status: Abnormal   Collection Time: 10/17/14  7:40 AM  Result Value Ref Range   Sodium 135 135 - 145 mmol/L   Potassium 4.0 3.5 - 5.1 mmol/L   Chloride 96 96 - 112 mmol/L   CO2 28 19 - 32 mmol/L   Glucose, Bld 147 (H) 70 - 99 mg/dL   BUN 14 6 - 23 mg/dL   Creatinine, Ser 2.78 (H) 0.50 - 1.10 mg/dL    Comment: DIALYSIS   Calcium 9.1 8.4 - 10.5 mg/dL   Phosphorus 4.7 (H) 2.3 - 4.6 mg/dL   Albumin 3.3 (L) 3.5 - 5.2 g/dL   GFR calc non Af Amer 16 (L) >90 mL/min   GFR calc Af Amer 19 (L) >90 mL/min    Comment: (NOTE) The eGFR has been calculated using the CKD EPI equation. This calculation has not been validated in all clinical situations. eGFR's persistently <90 mL/min signify possible Chronic Kidney Disease.    Anion gap 11 5 - 15  CBC     Status: Abnormal   Collection Time: 10/17/14  7:40 AM  Result Value Ref Range   WBC 5.3 4.0 - 10.5 K/uL   RBC 4.47 3.87 - 5.11 MIL/uL   Hemoglobin 12.7 12.0 - 15.0 g/dL   HCT 40.3 36.0 - 46.0 %   MCV 90.2 78.0 - 100.0 fL   MCH 28.4 26.0 - 34.0 pg   MCHC 31.5 30.0 -  36.0 g/dL   RDW 17.9 (H) 11.5 - 15.5 %   Platelets 112 (L) 150 - 400 K/uL    Comment: PLATELET COUNT CONFIRMED BY SMEAR  Glucose, capillary     Status: Abnormal   Collection Time: 10/17/14  8:09 AM  Result Value Ref Range   Glucose-Capillary 157 (H) 70 - 99 mg/dL  Lactate dehydrogenase, pleural or peritoneal fluid     Status: Abnormal   Collection Time: 10/17/14 12:16 PM  Result Value Ref Range   LD, Fluid 82 (H) 3 - 23 U/L    Comment: (NOTE) Results should be evaluated in conjunction with serum values    Fluid Type-FLDH FLUID     Comment: RIGHT PLEURAL CORRECTED ON 02/18 AT 1254: PREVIOUSLY REPORTED AS Pleural R   Protein, pleural or peritoneal fluid     Status: None   Collection Time: 10/17/14 12:16 PM  Result Value Ref Range   Total protein, fluid 3.0 g/dL    Comment: (NOTE) No normal range established for this test Results should be evaluated in conjunction with serum values    Fluid Type-FTP FLUID     Comment: RIGHT PLEURAL CORRECTED ON 02/18 AT 1255: PREVIOUSLY REPORTED AS Pleural R   Body fluid cell count with differential     Status: Abnormal   Collection Time: 10/17/14 12:16 PM  Result Value Ref Range   Fluid Type-FCT FLUID     Comment: RIGHT PLEURAL CORRECTED ON 02/18 AT 1254: PREVIOUSLY REPORTED AS Pleural R    Color, Fluid AMBER (A) YELLOW   Appearance, Fluid CLOUDY (A) CLEAR   WBC, Fluid 165 0 - 1000 cu mm   Neutrophil Count, Fluid 3 0 - 25 %   Lymphs, Fluid  3 %   Monocyte-Macrophage-Serous Fluid 94 (H) 50 - 90 %   Eos, Fluid 0 %  Body fluid culture     Status: None (Preliminary result)   Collection Time: 10/17/14 12:16 PM  Result Value Ref Range   Specimen Description FLUID RIGHT PLEURAL    Special Requests FLUID RIGHT PLEURAL    Gram Stain      NO WBC SEEN NO ORGANISMS SEEN Performed at [facility]    Culture      NO GROWTH 1 DAY Performed at [facility]    Report Status PENDING   Amylase, Pleural Fluid     Status: None    Collection Time: 10/17/14 12:16 PM  Result Value Ref Range   Amylase, Pleural Fluid 17 U/L    Comment: (NOTE) Reference range: Values are considered abnormal if they are greater than or equal to two times a simultaneously analyzed serum value. Performed at [facility]   Glucose, capillary     Status: None   Collection Time: 10/17/14 12:36 PM  Result Value Ref Range   Glucose-Capillary 84 70 - 99 mg/dL  Glucose, capillary     Status: Abnormal   Collection Time: 10/17/14  5:32 PM  Result Value Ref Range   Glucose-Capillary 178 (H) 70 - 99 mg/dL  Glucose, capillary     Status: Abnormal   Collection Time: 10/17/14  8:40 PM  Result Value Ref Range   Glucose-Capillary 186 (H) 70 - 99 mg/dL  CBC     Status: Abnormal   Collection Time: 10/18/14  5:00 AM  Result Value Ref Range   WBC 5.5 4.0 - 10.5 K/uL   RBC 4.45 3.87 - 5.11 MIL/uL   Hemoglobin 12.1 12.0 - 15.0 g/dL   HCT 39.6 36.0 - 46.0 %   MCV 89.0 78.0 - 100.0 fL   MCH 27.2 26.0 - 34.0 pg   MCHC 30.6 30.0 - 36.0 g/dL   RDW 17.6 (H) 11.5 - 15.5 %   Platelets 100 (L) 150 - 400 K/uL    Comment: PLATELET COUNT CONFIRMED BY SMEAR  Renal function panel     Status: Abnormal   Collection Time: 10/18/14  5:00 AM  Result Value Ref Range   Sodium 134 (L) 135 - 145 mmol/L   Potassium 4.5 3.5 - 5.1 mmol/L   Chloride 97 96 - 112 mmol/L   CO2 25 19 - 32 mmol/L   Glucose, Bld 230 (H) 70 - 99 mg/dL   BUN 28 (H) 6 - 23 mg/dL    Comment: REPEATED TO VERIFY   Creatinine, Ser 4.46 (H) 0.50 - 1.10 mg/dL    Comment: REPEATED TO VERIFY   Calcium 8.5 8.4 - 10.5 mg/dL   Phosphorus 5.3 (H) 2.3 - 4.6 mg/dL   Albumin 3.2 (L) 3.5 - 5.2 g/dL   GFR calc non Af Amer 9 (L) >90 mL/min   GFR calc Af Amer 11 (L) >90 mL/min    Comment: (NOTE) The eGFR has been calculated using the CKD EPI equation. This calculation has not been validated in all clinical situations. eGFR's persistently <90 mL/min signify possible Chronic Kidney Disease.     Anion gap 12 5 - 15    Brief narrative: ZAYLIE GISLER is a 74 y.o. female sent from [facility] with a history of end-stage renal disease on dialysis Monday Wednesday Friday, unspecified congestive heart failure, CAD status post CABG, recurrent paroxysmal atrial fibrillation, COPD who became short of breath after waking  up in the morning. She was found to be fluid overloaded in the ER and therefore referred to be admitted for dialysis. According to the history obtained from the admitting doctor, she had ran out of her midodrine and amiodarone.     Assessment/Plan: Principal Problem:  Acute respiratory failure with hypoxia-- ESRD (end stage renal disease), recurrent right-sided pleural effusion, severe end-stage aortic stenosis with poor prognosis per cardiology notes from previous admission CXR yesterday showed increased large R effusion & new small L effusion, afebrile, breathing improved after HD , and after right thoracentesis done by interventional radiology.  Appears to be a transudative pleural effusion PCP to follow pleural fluid culture   Aortic stenosis, severe Refer to note from Sherren Mocha 11/25 "she has ESRD, severe AS, and is chronically-ill. I do not think there is anything we can do to alter her extremely poor prognosis at this point. She has been formally evaluated by our multidisciplinary valve team and is not a candidate for treatment of her AS. I discussed hospice/palliative care with the patient and her son. The patient is not interested in this. However, she is a DNR. I advised she would likely have better quality of what time she has left with a palliative approach to her care"  Hypotension She takes midodrine pre-hemodialysis   Chronic right-sided heart failure/recurrent right-sided pleural effusion Continue hemodialysis Status post right thoracentesis by interventional radiology 750 cc blood tinged fluid Repeat chest x-ray without  pneumothorax   Paroxysmal atrial fibrillation- coronary artery disease status post stent - On amiodarone which is being continued- it is noted that she is not on full anticoagulation but takes aspirin  - Plavix was discontinued in 11/15- she had acute blood loss anemia determined to be secondary to GI bleed from gastritis at that time   Chronic gastritis with bleeding - Can continue Protonix  Diabetes mellitus CBG greater than 300, start the patient on Levemir 10 units, NovoLog SSI   Code Status: DO NOT RESUSCITATE    Discharge Exam:    Blood pressure 121/53, pulse 58, temperature 98 F (36.7 C), temperature source Oral, resp. rate 16, height _0  (1.626 m), weight 54.8 kg (120 lb 13 oz), SpO2 96 %. General: Pleasant, NAD Psych: Normal affect. Neuro: Alert and oriented X 3. Moves all extremities spontaneously. HEENT: Normal Neck: Supple without bruits or JVD. Lungs: Resp regular and unlabored, rale in L base, markedly decreased breath soundin R base Heart: RRR no s3, s4. 4/6 systolic murmur Abdomen: Soft, non-tender, non-distended, BS + x 4.  Extremities: No clubbing, cyanosis or edema. LUE AVF        Discharge Instructions    Diet - low sodium heart healthy    Complete by:  As directed      Increase activity slowly    Complete by:  As directed            Follow-up Information    Follow up with HAGUE, Rosalyn Charters, MD. Schedule an appointment as soon as possible for a visit in 3 days.   Specialty:  Internal Medicine   Contact information:   [address] [place] [address] [phone]       Signed: Reyne Dumas 10/18/2014, 11:13 AM

## 2014-10-18 NOTE — Procedures (Signed)
I have personally attended this patient's dialysis session.   Pre weight 54.8 (EDW 54.5) Goal 2 liters Will have lower EDW at discharge - post weight will be new EDW K 4 - change to 3K bath  Camille Balynthia Ola Raap, MD [facility] [phone] Pager 10/18/2014, 8:58 AM

## 2014-10-18 NOTE — Progress Notes (Addendum)
[facility] Rounding Note Subjective:    Had thoracentesis yesterday of 750 ml fluid with improvement in chest xray, though still with effusion In dialysis now.  Objective: Vital signs in last 24 hours: Temp:  [98 F (36.7 C)-98.6 F (37 C)] 98 F (36.7 C) (02/19 0536) Pulse Rate:  [52-56] 55 (02/19 0830) Resp:  [15-20] 15 (02/19 0830) BP: (110-124)/(37-61) 119/46 mmHg (02/19 0830) SpO2:  [95 %-98 %] 96 % (02/19 0800) Weight:  [54.8 kg (120 lb 13 oz)] 54.8 kg (120 lb 13 oz) (02/19 0752) Weight change:     EXAM: General appearance: Alert, in no apparent distress On dialysis Resp:  Markedly improved air movement right chest Cardio:  RRR with Gr II/VI systolic murmur, no rub GI:  + BS, soft and nontender Extremities:  No edema Access:  AVF @ LUA cannulated with BFR 400  Lab Results:  Recent Labs  10/17/14 0740 10/18/14 0500  WBC 5.3 5.5  HGB 12.7 12.1  HCT 40.3 39.6  PLT 112* PENDING   BMET:   Recent Labs  10/16/14 1042 10/17/14 0740  NA 133* 135  K 5.7* 4.0  CL 95* 96  CO2 24 28  GLUCOSE 321* 147*  BUN 23 14  CREATININE 4.54* 2.78*  CALCIUM 8.8 9.1  ALBUMIN 3.4* 3.3*  Phos            4.7  Studies/Results: Dg Chest 1 View  10/17/2014   CLINICAL DATA:  Post thoracentesis.  Spitting out blood.  EXAM: CHEST  1 VIEW  COMPARISON:  10/06/2014  FINDINGS: Decrease in right pleural effusion, now small volume. There has been uncovering of atelectasis but no definitive re-expansion pulmonary edema. There is an interface over the peripheral right chest which is likely skin fold. No convincing pneumothorax. The left lung remains relatively clear.  Stable cardiomegaly post CABG. Aortic and hilar contours are unchanged.  IMPRESSION: Decreased right pleural effusion post thoracentesis. No pneumothorax.   Electronically Signed   By: Marnee SpringJonathon  Watts M.D.   On: 10/17/2014 13:14   [place] Thoracentesis Asp Pleural Space W/img Guide  10/17/2014   INDICATION: Symptomatic  Right sided pleural effusion  EXAM: US THORACENTESIS ASP PLEURAL SPACE W/IMG GUIDE  COMPARISON:  None.  MEDICATIONS: 10 cc 1% lidocaine  COMPLICATIONS: None immediate  TECHNIQUE: Informed written consent was obtained from the patient after a discussion of the risks, benefits and alternatives to treatment. A timeout was performed prior to the initiation of the procedure.  Initial ultrasound scanning demonstrates a right pleural effusion. The lower chest was prepped and draped in the usual sterile fashion. 1% lidocaine was used for local anesthesia.  Under direct ultrasound guidance, a 19 gauge, 7-cm, Yueh catheter was introduced. An ultrasound image was saved for documentation purposes. the thoracentesis was performed. The catheter was removed and a dressing was applied. The patient tolerated the procedure well without immediate post procedural complication. The patient was escorted to have an upright chest radiograph.  FINDINGS: A total of approximately 750 cc of blood tinged fluid was removed. Requested samples were sent to the laboratory.  IMPRESSION: Successful ultrasound-guided R sided thoracentesis yielding 750 cc liters of pleural fluid.  Read by:  Robet LeuPamela A Turpin [facility]   Electronically Signed   By: Gilmer MorJaime  Wagner D.O.   On: 10/17/2014 12:28   Inpatient Medications: . amiodarone  200 mg Oral Daily  . aspirin  81 mg Oral Daily  . atorvastatin  40 mg Oral QHS  . calcitRIOL  1 mcg Oral  Q M,W,F-HD  . Chlorhexidine Gluconate Cloth  6 each Topical Q0600  . ferric gluconate (FERRLECIT/NULECIT) IV  125 mg Intravenous Q M,W,F-HD  . insulin aspart  0-9 Units Subcutaneous TID WC  . insulin detemir  10 Units Subcutaneous Daily  . loratadine  10 mg Oral Daily  . midodrine  2.5 mg Oral 2 times per day on Sun Tue Thu Sat  . midodrine  5 mg Oral Q M,W,F-HD  . multivitamin  1 tablet Oral QHS  . mupirocin ointment  1 application Nasal BID  . pantoprazole  40 mg Oral Daily  . sodium chloride  3 mL Intravenous Q12H       Dialysis Orders: MWF @ [facility] 4:30 54.5 kg 3K/2.25Ca 350/A1.5 No Heparin AVF @ LUA Calcitriol 1 mcg No Mircera Venofer 100 mg x 10 (through 2/22)     Assessment/Plan: 1. Dyspnea - CXR 2/16  showed increased large R effusion & new small L effusion. Now s/p thoracentesis, 750 ml removed (2/17). Much better air movement today and breathing better. 2. ESRD - HD on MWF @ [facility], dialysis today. Pre weight 54.8 Goal 2 liters. Post weight today = new EDW 3. HTN/volume - Midodrine pre-HD 4. Anemia - Hgb 12.1, on IV Fe x 10 for T-sat 17%. 5. Metabolic bone disease -  iPTH 161; Calcitriol 1 mcg, no binders. Start binder. Tums 1 ac 6. Nutrition - Alb 3.4, renal carb-mod diet, vitamin. 7. DM insulin per primary. 8. CAD - s/p CABG in 09/2009. 9. PAF - on Amiodarone. 10. Aortic stenosis - severe, inoperable.  11. DNR status 12. Dispo - ? Home today?   LOS: 3 days   Marleena Shubert B 10/18/2014,8:42 AM

## 2014-10-18 NOTE — Evaluation (Signed)
Physical Therapy Evaluation Patient Details Name: [name] MRN: [id number] DOB: 27-Jan-1941 Today's Date: 10/18/2014   History of Present Illness  pt presents with SOB and found to be in fluid overload.  pt with hx of ESRD and HD M-W-F.    Clinical Impression  Pt jst returned from HD and indicates fatigued at this time, but agreeable to try some mobility with PT.  Pt at baseline needs A for mobility and per pt states after HD her son has to help her in the house and she rests most of the afternoon.  Pt indicates not feeling any different than she normally does post-HD and states she has all needed DME at home.  No further acute PT needs at this time.  Will sign off.      Follow Up Recommendations Home health PT;Supervision - Intermittent    Equipment Recommendations  None recommended by PT    Recommendations for Other Services       Precautions / Restrictions Precautions Precautions: Fall Restrictions Weight Bearing Restrictions: No      Mobility  Bed Mobility Overal bed mobility: Needs Assistance Bed Mobility: Supine to Sit     Supine to sit: Min assist     General bed mobility comments: A with bringing trunk to sitting, but pt indicates this is normal for her.    Transfers Overall transfer level: Needs assistance Equipment used: None Transfers: Sit to/from Stand Sit to Stand: Min assist         General transfer comment: Very minimal A to power up to standing position and now A for returning to sitting.    Ambulation/Gait             General Gait Details: pt declined post-HD.    Stairs            Wheelchair Mobility    Modified Rankin (Stroke Patients Only)       Balance Overall balance assessment: Needs assistance Sitting-balance support: No upper extremity supported;Feet supported Sitting balance-Leahy Scale: Good     Standing balance support: No upper extremity supported;During functional activity Standing balance-Leahy Scale:  Fair                               Pertinent Vitals/Pain Pain Assessment: No/denies pain    Home Living Family/patient expects to be discharged to:: Private residence Living Arrangements: Children (Son) Available Help at Discharge: Family;Available PRN/intermittently Type of Home: House Home Access: Ramped entrance     Home Layout: One level Home Equipment: [place] - 4 wheels;Cane - single point;Walker - 2 wheels;Shower seat Additional Comments: pt indicates son lives with her and provides care most of the day.      Prior Function Level of Independence: Needs assistance   Gait / Transfers Assistance Needed: ambulates with cane or rollator. Needs assist for getting OOB and coming to stand.    ADL's / Homemaking Assistance Needed: pt needs A with donning socks and sometimes pants when fatigued.          Hand Dominance   Dominant Hand: Right    Extremity/Trunk Assessment   Upper Extremity Assessment: Generalized weakness           Lower Extremity Assessment: Generalized weakness      Cervical / Trunk Assessment: Kyphotic  Communication   Communication: No difficulties  Cognition Arousal/Alertness: Awake/alert Behavior During Therapy: WFL for tasks assessed/performed Overall Cognitive Status: Within Functional Limits for tasks assessed  General Comments      Exercises        Assessment/Plan    PT Assessment All further PT needs can be met in the next venue of care  PT Diagnosis Difficulty walking   PT Problem List Decreased strength;Decreased activity tolerance;Decreased balance;Decreased mobility;Decreased knowledge of use of DME  PT Treatment Interventions     PT Goals (Current goals can be found in the Care Plan section) Acute Rehab PT Goals PT Goal Formulation: All assessment and education complete, DC therapy    Frequency     Barriers to discharge        Co-evaluation               End of  Session Equipment Utilized During Treatment: Gait belt Activity Tolerance: Patient limited by fatigue Patient left: in bed;with call bell/phone within reach Nurse Communication: Mobility status         Time: 2111-5520 PT Time Calculation (min) (ACUTE ONLY): 13 min   Charges:   PT Evaluation $Initial PT Evaluation Tier I: 1 Procedure     PT G CodesCatarina Hartshorn, [place] 10/18/2014, 2:32 PM

## 2014-10-19 NOTE — Progress Notes (Signed)
CARE MANAGEMENT NOTE 10/19/2014  Patient:  [name],[name]   Account Number:  [id number]  Date Initiated:  10/18/2014  Documentation initiated by:  [facility],Krishan Mcbreen  Subjective/Objective Assessment:   ESRD, fluid overload     Action/Plan:   lives at home with son, [name] [phone]   Anticipated DC Date:  10/18/2014   Anticipated DC Plan:  HOME W HOME HEALTH SERVICES      DC Planning Services  CM consult      [facility]  [facility]   Choice offered to / List presented to:  C-1 Patient        HH arranged  HH-2 PT  HH-1 RN      [facility]   Status of service:   Medicare Important Message given?  YES (If response is "NO", the following Medicare IM given date fields will be blank) Date Medicare IM given:  10/18/2014 Medicare IM given by:  [facility],Jazzalyn Loewenstein Date Additional Medicare IM given:   Additional Medicare IM given by:    Discharge Disposition:  HOME W HOME HEALTH SERVICES  Per UR Regulation:    If discussed at Long Length of Stay Meetings, dates discussed:    Comments:  10/19/2014 1645 [facility] [place]. [facility] will be arranged with [facility]. Isidoro DonningAlesia Reymundo Winship RN CCM Case Mgmt phone [phone]  10/18/2014 1700 NCM spoke to pt and [facility]. Contacted son, [name] and he will bring portable tank. She has RW and 3n1 at home. Isidoro DonningAlesia Rodgerick Gilliand RN CCM Case Mgmt phone [phone]

## 2014-10-20 LAB — BODY FLUID CULTURE
CULTURE: NO GROWTH
Gram Stain: NONE SEEN

## 2014-10-21 LAB — OTHER BODY FLUID CHEMISTRY

## 2014-10-21 NOTE — Progress Notes (Signed)
CARE MANAGEMENT NOTE 10/21/2014  Patient:  [name],[name]   Account Number:  [id number]  Date Initiated:  10/18/2014  Documentation initiated by:  [facility],Melania Kirks  Subjective/Objective Assessment:   ESRD, fluid overload     Action/Plan:   lives at home with son, [name] [phone]   Anticipated DC Date:  10/18/2014   Anticipated DC Plan:  HOME W HOME HEALTH SERVICES      DC Planning Services  CM consult      [facility] Choice  HOME HEALTH   Choice offered to / List presented to:  C-1 Patient        HH arranged  HH-2 PT  HH-1 RN      [facility]   Status of service:  Completed, signed off Medicare Important Message given?  YES (If response is "NO", the following Medicare IM given date fields will be blank) Date Medicare IM given:  10/18/2014 Medicare IM given by:  [facility],Guss Farruggia Date Additional Medicare IM given:   Additional Medicare IM given by:    Discharge Disposition:  HOME W HOME HEALTH SERVICES  Per UR Regulation:  Reviewed for med. necessity/level of care/duration of stay  If discussed at Long Length of Stay Meetings, dates discussed:    Comments:  10/21/2014 1125 NCM contacted [facility] Isidoro DonningAlesia Judyth Demarais RN CCM Case Mgmt phone [phone]  10/19/2014 1645 [facility] [place]. HH will be arranged with [facility]. Isidoro DonningAlesia Marlies Ligman RN CCM Case Mgmt phone [phone]  10/18/2014 1700 NCM spoke to pt and [facility]. Contacted son, [name] and he will bring portable tank. She has RW and 3n1 at home. Isidoro DonningAlesia Gresham Caetano RN CCM Case Mgmt phone [phone]

## 2014-10-31 ENCOUNTER — Other Ambulatory Visit: Payer: Self-pay | Admitting: Cardiology

## 2014-10-31 NOTE — Telephone Encounter (Signed)
Rx(s) sent to pharmacy electronically.  

## 2015-02-11 ENCOUNTER — Other Ambulatory Visit: Payer: Self-pay | Admitting: Internal Medicine

## 2015-02-24 ENCOUNTER — Other Ambulatory Visit: Payer: Self-pay

## 2015-02-28 IMAGING — CR DG CHEST 2V
1 series · 1 of 1 positions shown · non-contrast
Comparison: DG CHEST PORTABLE dated 10/05/2013

CLINICAL DATA: History of CHF, diabetes, hypertension, COPD

EXAM:
CHEST  2 VIEW

[w chest lat]
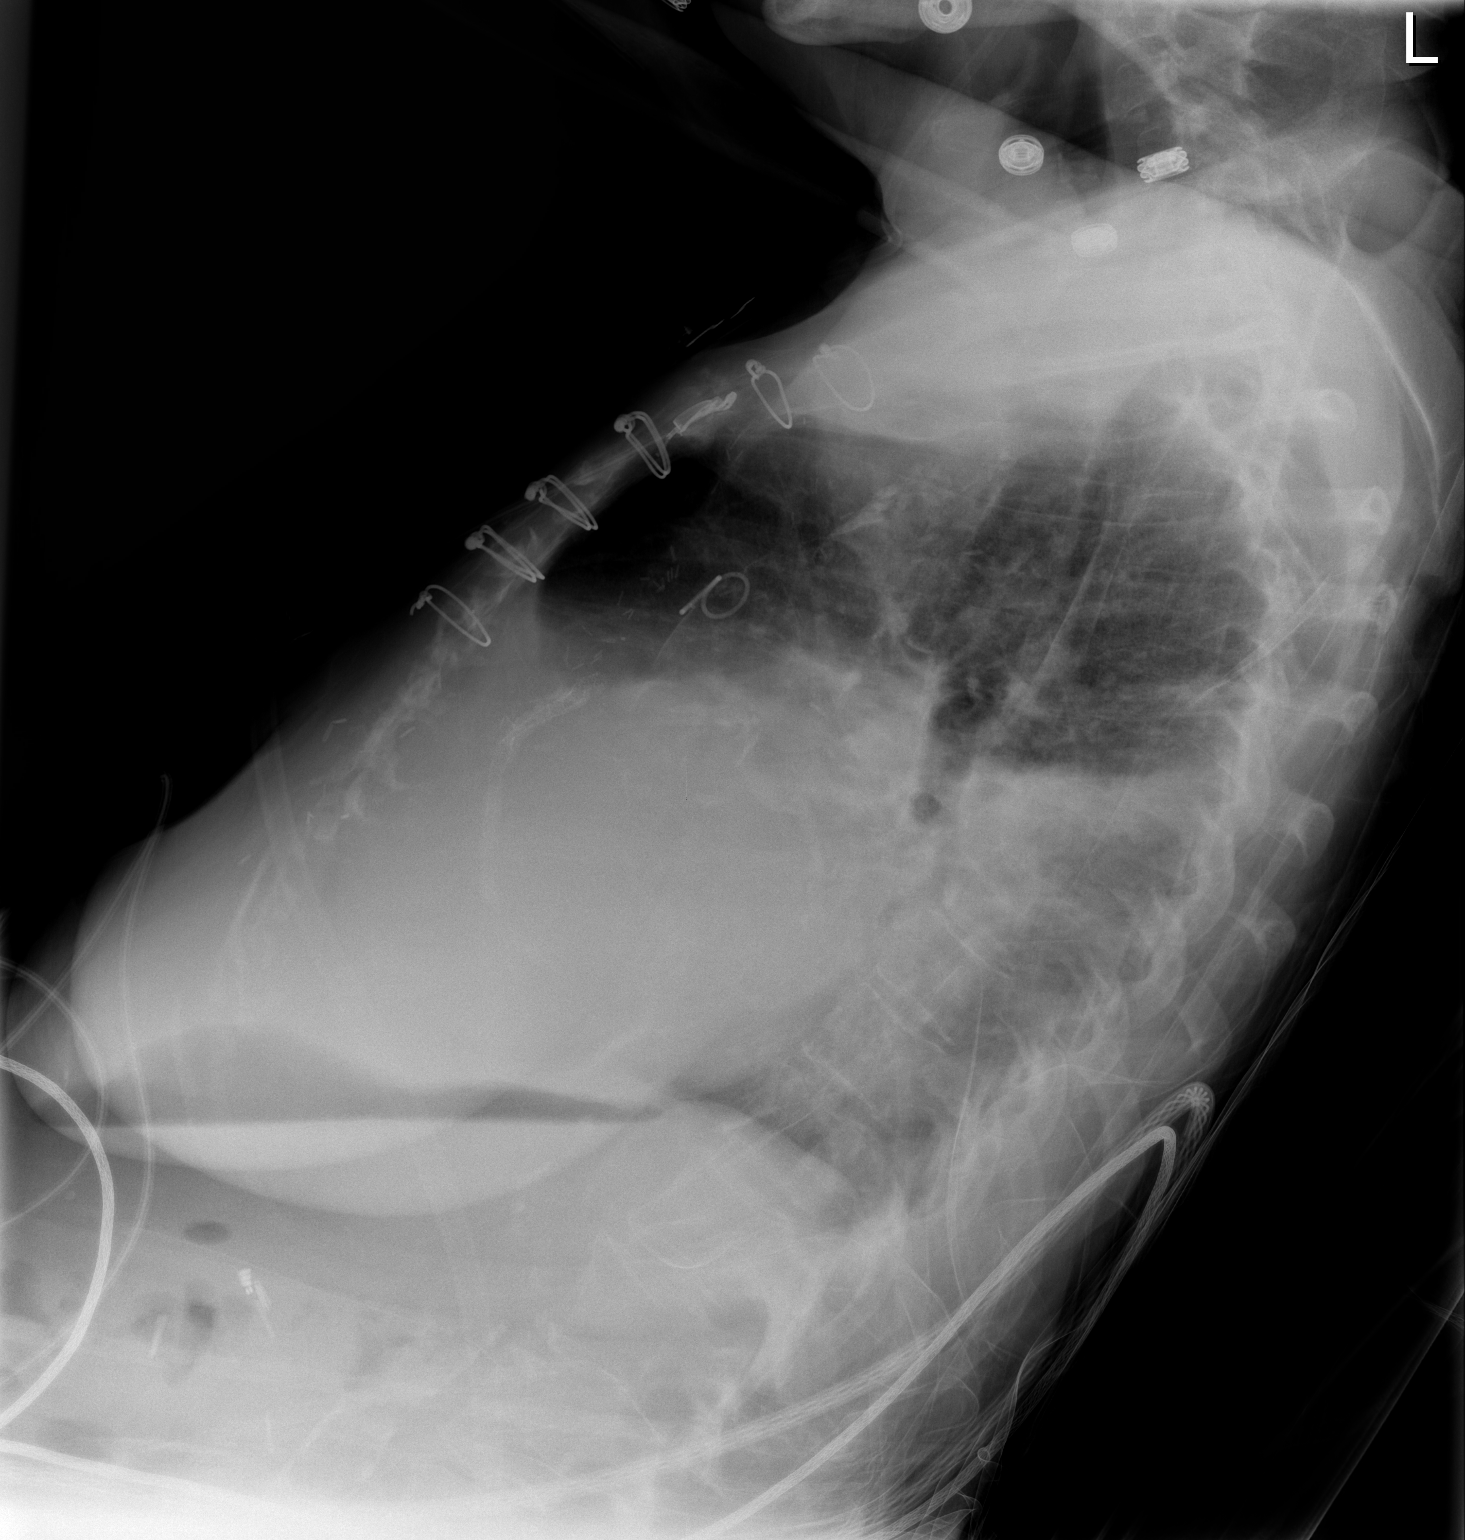

[1 of 1 positions shown; findings below may reference images not displayed]

FINDINGS: The cardiac silhouette is enlarged. A stable moderate sized right
pleural effusions appreciated. There is slight increase conspicuity
of lung parenchymal density in the aerated right lung base. No
further focal regions of consolidation or focal infiltrates
appreciated. Patient is status post median sternotomy and coronary
artery bypass grafting. The osseous structures unremarkable. There
is also slight decrease in conspicuity of interstitial findings.
IMPRESSION: Stable right pleural effusion. Slight increase conspicuity of the
atelectasis versus infiltrate right lung base. Findings which also
may reflect mild decreased pulmonary edema. Surveillance evaluation
recommended.

## 2015-03-03 ENCOUNTER — Other Ambulatory Visit: Payer: Self-pay | Admitting: Internal Medicine

## 2015-03-23 IMAGING — DX DG CHEST 1V PORT
2 series · 2 of 2 positions shown · non-contrast
Comparison: 10/27/2013

CLINICAL DATA: Cardiopulmonary arrest. Hypotension. Shortness of
breath.

EXAM:
PORTABLE CHEST - 1 VIEW

[portable (1 of 2)]
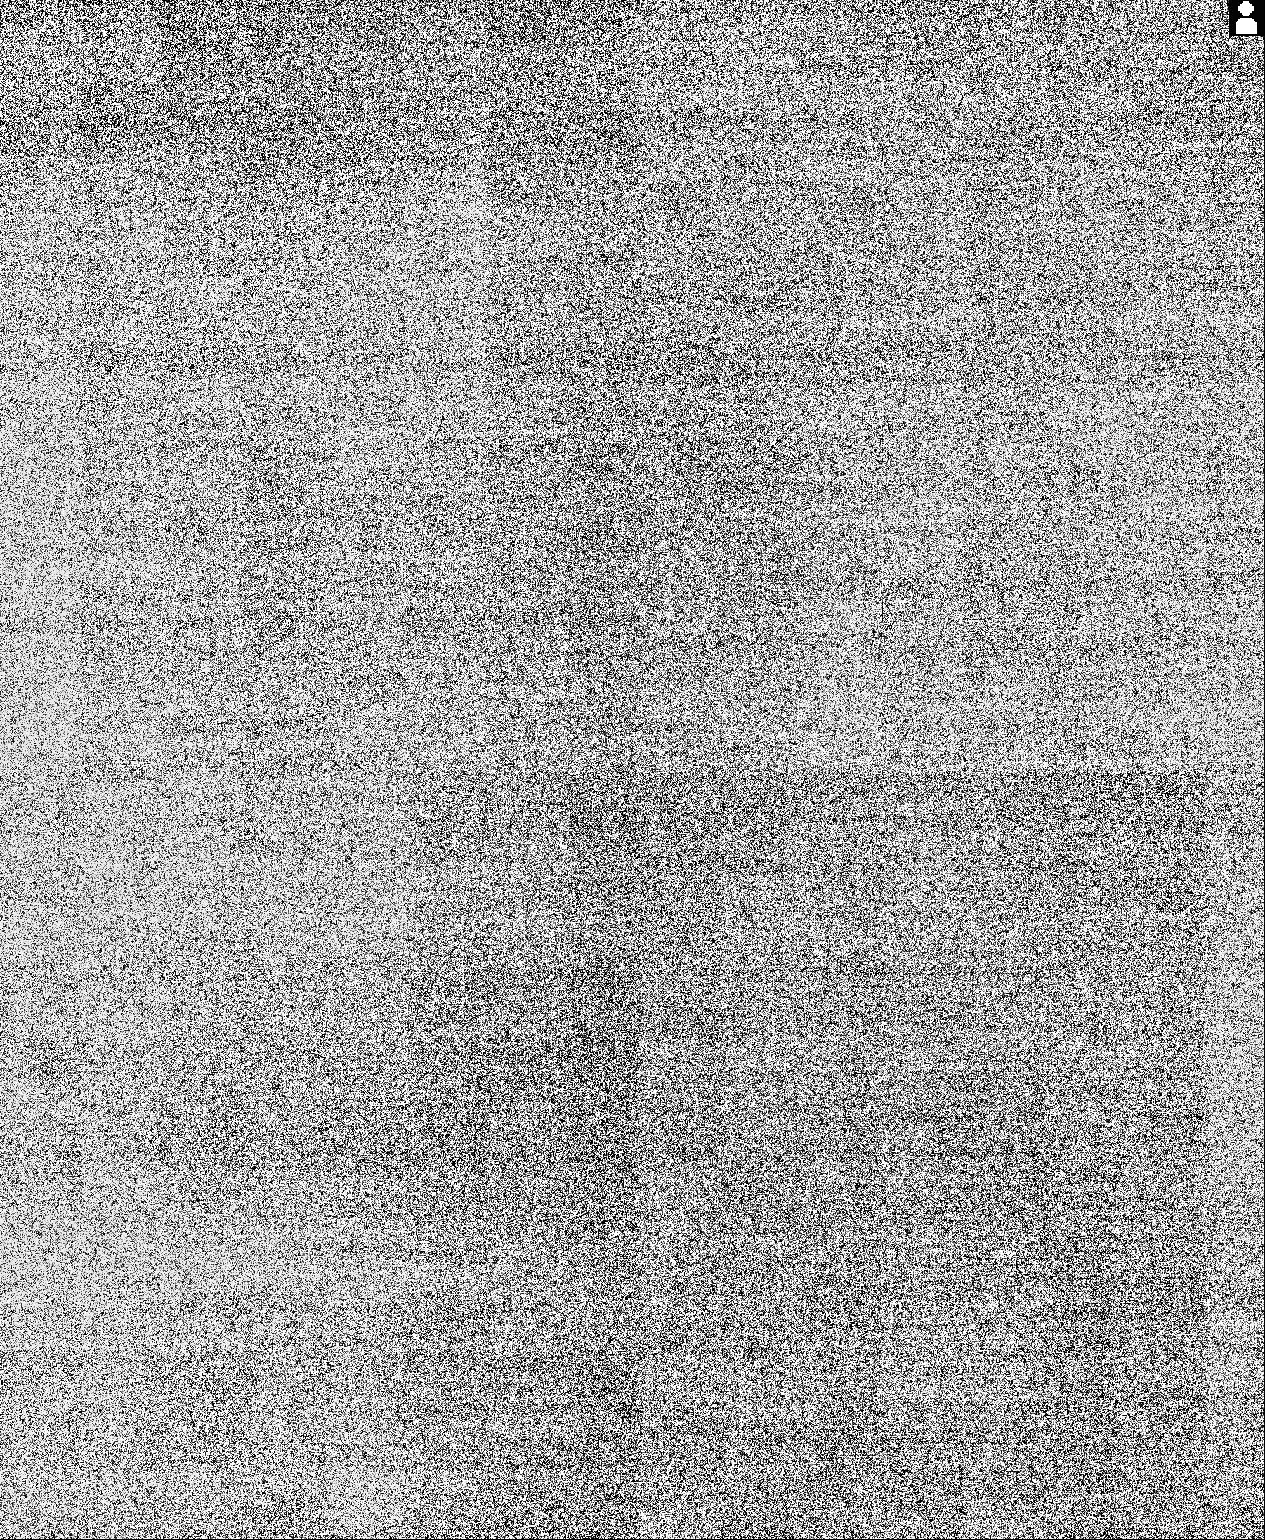

[portable (2 of 2)]
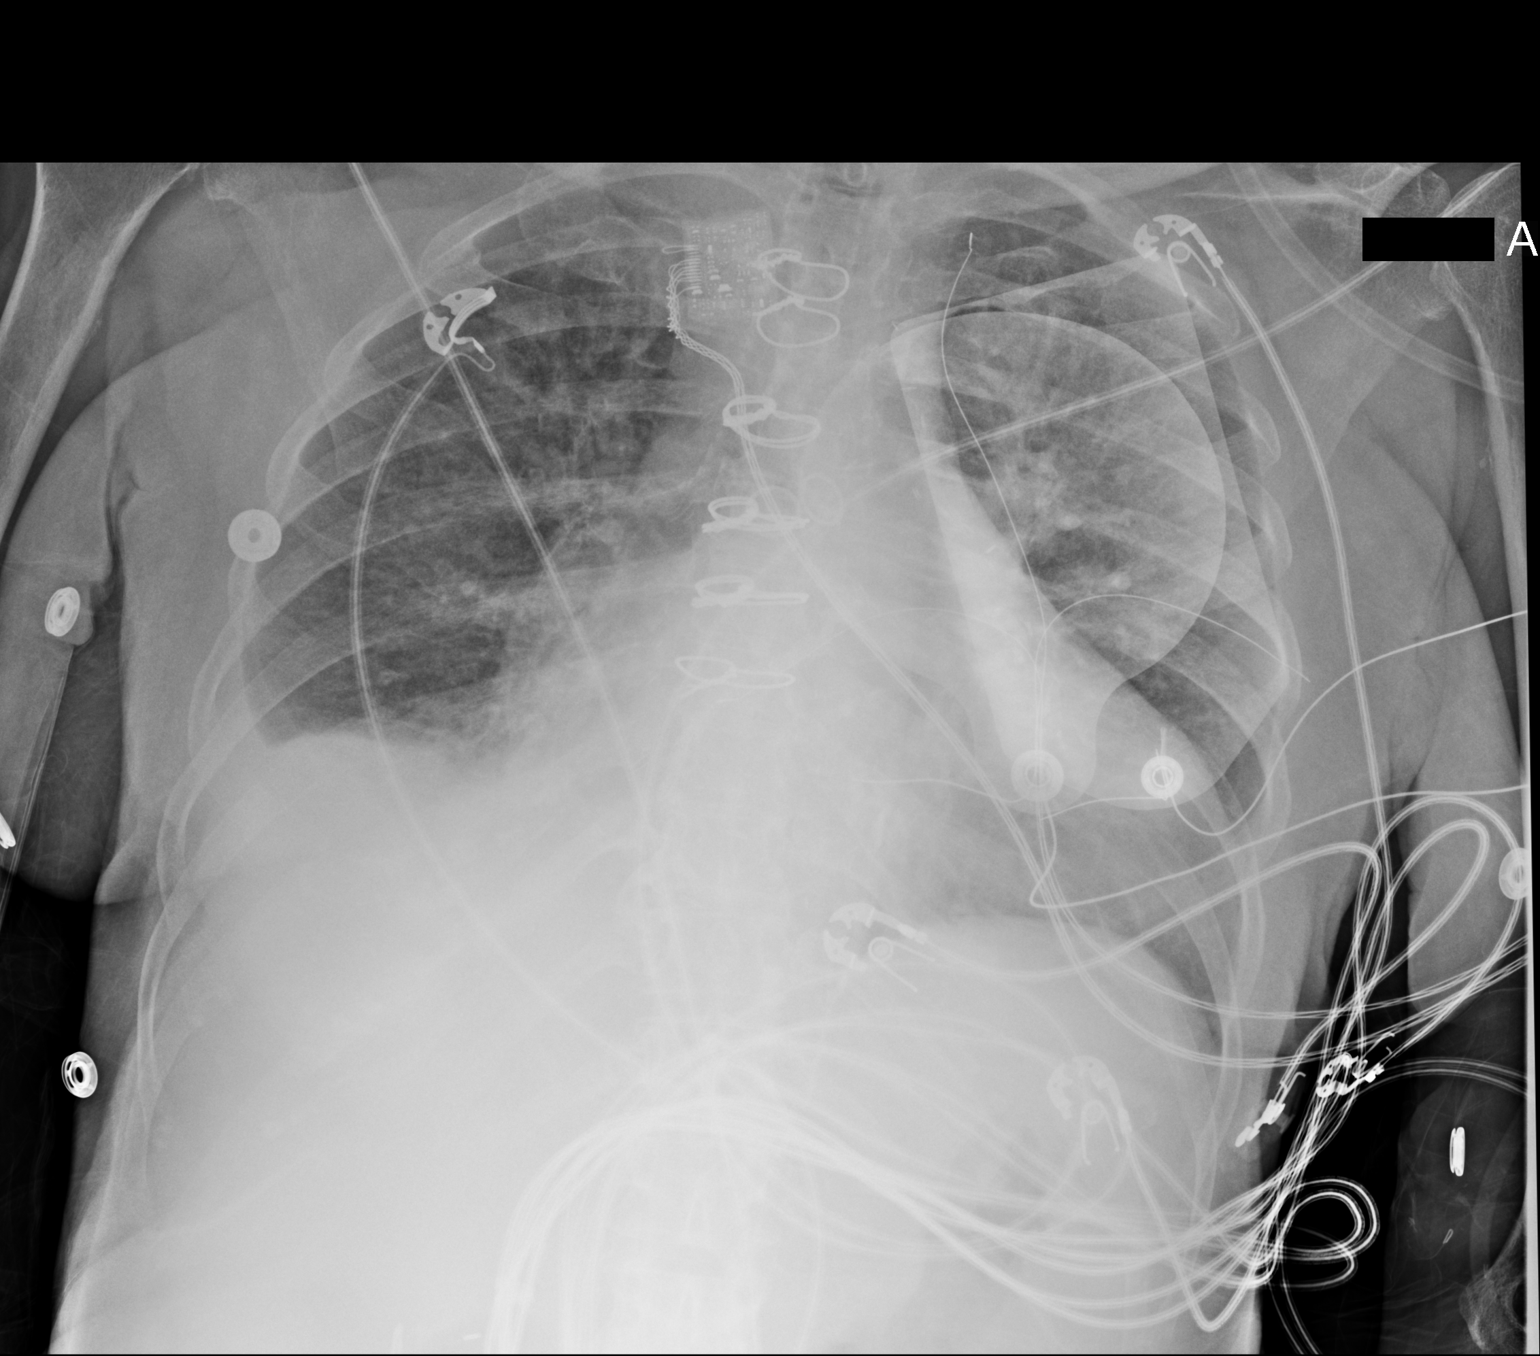

[2 of 2 positions shown; findings below may reference images not displayed]

FINDINGS: Prior CABG. Moderately enlarged cardiopericardial silhouette with
low lung volumes, small right pleural effusion, and interstitial
accentuation. Low lung volumes. No pneumothorax or well-defined rib
fracture.
IMPRESSION: 1. Cardiomegaly potentially with mild interstitial edema and a small
right pleural effusion.

## 2015-05-31 DEATH — deceased
